# Patient Record
Sex: Female | Born: 1980
Health system: Southern US, Community
[De-identification: ages and names within clinical notes are randomized; demographics above are authoritative.]

## PROBLEM LIST (undated history)

## (undated) DIAGNOSIS — Z8739 Personal history of other diseases of the musculoskeletal system and connective tissue: Secondary | ICD-10-CM

## (undated) DIAGNOSIS — T7840XA Allergy, unspecified, initial encounter: Secondary | ICD-10-CM

## (undated) DIAGNOSIS — K219 Gastro-esophageal reflux disease without esophagitis: Secondary | ICD-10-CM

## (undated) DIAGNOSIS — K589 Irritable bowel syndrome without diarrhea: Secondary | ICD-10-CM

## (undated) DIAGNOSIS — M1990 Unspecified osteoarthritis, unspecified site: Secondary | ICD-10-CM

## (undated) DIAGNOSIS — G8929 Other chronic pain: Secondary | ICD-10-CM

## (undated) DIAGNOSIS — F419 Anxiety disorder, unspecified: Secondary | ICD-10-CM

## (undated) DIAGNOSIS — F32A Depression, unspecified: Secondary | ICD-10-CM

## (undated) DIAGNOSIS — M797 Fibromyalgia: Secondary | ICD-10-CM

## (undated) DIAGNOSIS — J189 Pneumonia, unspecified organism: Secondary | ICD-10-CM

## (undated) DIAGNOSIS — R519 Headache, unspecified: Secondary | ICD-10-CM

## (undated) DIAGNOSIS — E669 Obesity, unspecified: Secondary | ICD-10-CM

## (undated) HISTORY — DX: Obesity, unspecified: E66.9

## (undated) HISTORY — DX: Fibromyalgia: M79.7

## (undated) HISTORY — DX: Irritable bowel syndrome, unspecified: K58.9

## (undated) HISTORY — DX: Depression, unspecified: F32.A

## (undated) HISTORY — DX: Allergy, unspecified, initial encounter: T78.40XA

## (undated) HISTORY — PX: TUBAL LIGATION: SHX77

## (undated) HISTORY — DX: Gastro-esophageal reflux disease without esophagitis: K21.9

## (undated) HISTORY — DX: Pneumonia, unspecified organism: J18.9

## (undated) HISTORY — DX: Other chronic pain: G89.29

## (undated) HISTORY — DX: Unspecified osteoarthritis, unspecified site: M19.90

## (undated) HISTORY — PX: SPINE SURGERY: SHX786

## (undated) HISTORY — DX: Headache, unspecified: R51.9

## (undated) HISTORY — PX: TONSILLECTOMY: SUR1361

---
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
## 1998-11-21 ENCOUNTER — Other Ambulatory Visit: Admission: RE | Admit: 1998-11-21 | Discharge: 1998-11-21 | Payer: Self-pay | Admitting: Obstetrics and Gynecology

## 1999-01-06 ENCOUNTER — Other Ambulatory Visit: Admission: RE | Admit: 1999-01-06 | Discharge: 1999-01-06 | Payer: Self-pay | Admitting: Obstetrics and Gynecology

## 1999-01-06 ENCOUNTER — Encounter (INDEPENDENT_AMBULATORY_CARE_PROVIDER_SITE_OTHER): Payer: Self-pay | Admitting: Specialist

## 1999-04-02 ENCOUNTER — Other Ambulatory Visit: Admission: RE | Admit: 1999-04-02 | Discharge: 1999-04-02 | Payer: Self-pay | Admitting: Gynecology

## 1999-06-25 ENCOUNTER — Other Ambulatory Visit: Admission: RE | Admit: 1999-06-25 | Discharge: 1999-06-25 | Payer: Self-pay | Admitting: Obstetrics and Gynecology

## 1999-09-23 ENCOUNTER — Other Ambulatory Visit: Admission: RE | Admit: 1999-09-23 | Discharge: 1999-09-23 | Payer: Self-pay | Admitting: Obstetrics and Gynecology

## 1999-10-02 ENCOUNTER — Encounter: Admission: RE | Admit: 1999-10-02 | Discharge: 1999-10-16 | Payer: Self-pay | Admitting: Family Medicine

## 2001-02-06 ENCOUNTER — Other Ambulatory Visit: Admission: RE | Admit: 2001-02-06 | Discharge: 2001-02-06 | Payer: Self-pay | Admitting: Obstetrics and Gynecology

## 2002-02-16 ENCOUNTER — Other Ambulatory Visit: Admission: RE | Admit: 2002-02-16 | Discharge: 2002-02-16 | Payer: Self-pay | Admitting: Obstetrics and Gynecology

## 2002-10-19 ENCOUNTER — Other Ambulatory Visit: Admission: RE | Admit: 2002-10-19 | Discharge: 2002-10-19 | Payer: Self-pay | Admitting: Obstetrics and Gynecology

## 2003-02-15 ENCOUNTER — Ambulatory Visit (HOSPITAL_COMMUNITY): Admission: RE | Admit: 2003-02-15 | Discharge: 2003-02-15 | Payer: Self-pay | Admitting: Obstetrics and Gynecology

## 2003-05-13 ENCOUNTER — Inpatient Hospital Stay (HOSPITAL_COMMUNITY): Admission: AD | Admit: 2003-05-13 | Discharge: 2003-05-15 | Payer: Self-pay | Admitting: Obstetrics and Gynecology

## 2003-06-17 ENCOUNTER — Other Ambulatory Visit: Admission: RE | Admit: 2003-06-17 | Discharge: 2003-06-17 | Payer: Self-pay | Admitting: Obstetrics and Gynecology

## 2006-11-07 ENCOUNTER — Inpatient Hospital Stay (HOSPITAL_COMMUNITY): Admission: AD | Admit: 2006-11-07 | Discharge: 2006-11-07 | Payer: Self-pay | Admitting: Obstetrics and Gynecology

## 2007-02-22 ENCOUNTER — Ambulatory Visit (HOSPITAL_COMMUNITY): Admission: RE | Admit: 2007-02-22 | Discharge: 2007-02-22 | Payer: Self-pay | Admitting: Obstetrics and Gynecology

## 2007-05-23 ENCOUNTER — Inpatient Hospital Stay (HOSPITAL_COMMUNITY): Admission: AD | Admit: 2007-05-23 | Discharge: 2007-05-25 | Payer: Self-pay | Admitting: Obstetrics and Gynecology

## 2008-07-26 ENCOUNTER — Emergency Department (HOSPITAL_COMMUNITY): Admission: EM | Admit: 2008-07-26 | Discharge: 2008-07-26 | Payer: Self-pay | Admitting: Emergency Medicine

## 2009-12-08 ENCOUNTER — Ambulatory Visit (HOSPITAL_COMMUNITY): Admission: RE | Admit: 2009-12-08 | Discharge: 2009-12-08 | Payer: Self-pay | Admitting: Occupational Medicine

## 2009-12-22 ENCOUNTER — Encounter
Admission: RE | Admit: 2009-12-22 | Discharge: 2010-03-22 | Payer: Self-pay | Source: Home / Self Care | Attending: Internal Medicine | Admitting: Internal Medicine

## 2010-06-12 ENCOUNTER — Other Ambulatory Visit: Payer: Self-pay | Admitting: Orthopedic Surgery

## 2010-06-12 ENCOUNTER — Ambulatory Visit
Admission: RE | Admit: 2010-06-12 | Discharge: 2010-06-12 | Disposition: A | Discharge status: Home / Self Care | Payer: Worker's Compensation | Source: Ambulatory Visit | Attending: Orthopedic Surgery | Admitting: Orthopedic Surgery

## 2010-06-12 DIAGNOSIS — M25512 Pain in left shoulder: Secondary | ICD-10-CM

## 2010-07-08 LAB — POCT I-STAT, CHEM 8
Calcium, Ion: 1.16 mmol/L (ref 1.12–1.32)
Creatinine, Ser: 0.9 mg/dL (ref 0.4–1.2)
Glucose, Bld: 85 mg/dL (ref 70–99)
HCT: 43 % (ref 36.0–46.0)
Hemoglobin: 14.6 g/dL (ref 12.0–15.0)
TCO2: 24 mmol/L (ref 0–100)

## 2010-12-18 LAB — CBC
HCT: 37.8
Hemoglobin: 13.4
MCHC: 35
MCHC: 35.6
MCV: 93
MCV: 93.8
Platelets: 222
Platelets: 247
RBC: 3.53 — ABNORMAL LOW
RBC: 4.07
RDW: 14
WBC: 12 — ABNORMAL HIGH
WBC: 14.7 — ABNORMAL HIGH

## 2010-12-18 LAB — RH IMMUNE GLOB WKUP(>/=20WKS)(NOT WOMEN'S HOSP): Fetal Screen: NEGATIVE

## 2010-12-18 LAB — RPR: RPR Ser Ql: NONREACTIVE

## 2011-01-05 LAB — RH IMMUNE GLOBULIN WORKUP (NOT WOMEN'S HOSP)
ABO/RH(D): A NEG
Antibody Screen: POSITIVE
DAT, IgG: NEGATIVE

## 2011-01-11 LAB — RH IMMUNE GLOBULIN WORKUP (NOT WOMEN'S HOSP)

## 2011-04-24 ENCOUNTER — Encounter (HOSPITAL_COMMUNITY): Payer: Self-pay

## 2011-04-24 ENCOUNTER — Other Ambulatory Visit: Payer: Self-pay

## 2011-04-24 ENCOUNTER — Emergency Department (HOSPITAL_COMMUNITY): Payer: 59

## 2011-04-24 ENCOUNTER — Emergency Department (HOSPITAL_COMMUNITY)
Admission: EM | Admit: 2011-04-24 | Discharge: 2011-04-24 | Disposition: A | Discharge status: Home / Self Care | Payer: 59 | Attending: Emergency Medicine | Admitting: Emergency Medicine

## 2011-04-24 DIAGNOSIS — Z79899 Other long term (current) drug therapy: Secondary | ICD-10-CM | POA: Insufficient documentation

## 2011-04-24 DIAGNOSIS — J45909 Unspecified asthma, uncomplicated: Secondary | ICD-10-CM | POA: Insufficient documentation

## 2011-04-24 DIAGNOSIS — R0789 Other chest pain: Secondary | ICD-10-CM

## 2011-04-24 DIAGNOSIS — R079 Chest pain, unspecified: Secondary | ICD-10-CM | POA: Insufficient documentation

## 2011-04-24 DIAGNOSIS — E876 Hypokalemia: Secondary | ICD-10-CM | POA: Insufficient documentation

## 2011-04-24 LAB — POCT I-STAT, CHEM 8
Calcium, Ion: 1.18 mmol/L (ref 1.12–1.32)
Chloride: 109 mEq/L (ref 96–112)
Glucose, Bld: 87 mg/dL (ref 70–99)
HCT: 41 % (ref 36.0–46.0)
Hemoglobin: 13.9 g/dL (ref 12.0–15.0)
TCO2: 21 mmol/L (ref 0–100)

## 2011-04-24 LAB — D-DIMER, QUANTITATIVE: D-Dimer, Quant: 1.36 ug/mL-FEU — ABNORMAL HIGH (ref 0.00–0.48)

## 2011-04-24 MED ORDER — IBUPROFEN 800 MG PO TABS: 800.0000 mg | ORAL_TABLET | Freq: Three times a day (TID) | ORAL | Status: AC | PRN

## 2011-04-24 MED ORDER — IOHEXOL 350 MG/ML SOLN
80.0000 mL | Freq: Once | INTRAVENOUS | Status: AC | PRN
  Administered 2011-04-24: 80 mL via INTRAVENOUS

## 2011-04-24 MED ORDER — ONDANSETRON HCL 4 MG/2ML IJ SOLN
4.0000 mg | Freq: Once | INTRAMUSCULAR | Status: DC
  Filled 2011-04-24: qty 2

## 2011-04-24 MED ORDER — FENTANYL CITRATE 0.05 MG/ML IJ SOLN
50.0000 ug | Freq: Once | INTRAMUSCULAR | Status: DC
  Filled 2011-04-24: qty 2

## 2011-04-24 MED ORDER — ASPIRIN 81 MG PO CHEW
324.0000 mg | CHEWABLE_TABLET | Freq: Once | ORAL | Status: DC
  Filled 2011-04-24: qty 4

## 2011-04-24 MED ORDER — POTASSIUM CHLORIDE CRYS ER 20 MEQ PO TBCR
40.0000 meq | EXTENDED_RELEASE_TABLET | Freq: Once | ORAL | Status: AC
  Administered 2011-04-24: 40 meq via ORAL
  Filled 2011-04-24: qty 2

## 2011-04-24 MED ORDER — MORPHINE SULFATE 4 MG/ML IJ SOLN
4.0000 mg | Freq: Once | INTRAMUSCULAR | Status: AC
  Administered 2011-04-24: 4 mg via INTRAVENOUS
  Filled 2011-04-24: qty 1

## 2011-04-24 MED ORDER — HYDROCODONE-ACETAMINOPHEN 5-325 MG PO TABS: 1.0000 | ORAL_TABLET | Freq: Four times a day (QID) | ORAL | Status: AC | PRN

## 2011-04-24 MED ORDER — KETOROLAC TROMETHAMINE 30 MG/ML IJ SOLN
30.0000 mg | Freq: Once | INTRAMUSCULAR | Status: AC
  Administered 2011-04-24: 30 mg via INTRAVENOUS
  Filled 2011-04-24: qty 1

## 2011-04-24 NOTE — ED Provider Notes (Signed)
History     CSN: [id number]  Arrival date & time 04/24/11  1535   First MD Initiated Contact with Patient 04/24/11 1539      Chief Complaint  Patient presents with  . Chest Pain    (Consider location/radiation/quality/duration/timing/severity/associated sxs/prior treatment) HPI Patient states that she was working here in x-ray shows narrowing of patient's a sudden onset of right-sided chest pain that radiates to her right shoulder blade and back.  Patient states she has not short of breath but it hurts to take a deep breath.  She denies abdominal pain, nausea/vomiting, weakness, headache, or visual changes.  Patient does not worsening with movement and noted after pushing a stretcher but was more constant and increasing. Past Medical History  Diagnosis Date  . Asthma     History reviewed. No pertinent past surgical history.  History reviewed. No pertinent family history.  History  Substance Use Topics  . Smoking status: Never Smoker   . Smokeless tobacco: Not on file  . Alcohol Use: No    OB History    Grav Para Term Preterm Abortions TAB SAB Ect Mult Living                  Review of Systems All pertinent positives and negatives reviewed in the history of present illness  Allergies  Sulfa antibiotics  Home Medications   Current Outpatient Rx  Name Route Sig Dispense Refill  . ALBUTEROL SULFATE HFA 108 (90 BASE) MCG/ACT IN AERS Inhalation Inhale 2 puffs into the lungs every 6 (six) hours as needed. For wheezing    . AMOXICILLIN 875 MG PO TABS Oral Take 875 mg by mouth 2 (two) times daily.    Marland Kitchen CETIRIZINE HCL 10 MG PO TABS Oral Take 10 mg by mouth daily as needed. For allergy symptoms    . DESOGESTREL-ETHINYL ESTRADIOL 0.15-0.02/0.01 MG (21/5) PO TABS Oral Take 1 tablet by mouth daily.    . IBUPROFEN 200 MG PO TABS Oral Take 800 mg by mouth daily as needed. For pain    . PROBIOTIC PO Oral Take 1 tablet by mouth every morning.      BP 122/77  Pulse 96   Temp(Src) 97.6 F (36.4 C) (Oral)  Resp 14  SpO2 100%  LMP 01/28/2011  Physical Exam  Constitutional: She is oriented to person, place, and time. She appears well-developed and well-nourished. She appears distressed.  HENT:  Head: Normocephalic and atraumatic.  Cardiovascular: Normal rate, regular rhythm and normal heart sounds.  Exam reveals no gallop and no friction rub.   No murmur heard. Pulmonary/Chest: Effort normal and breath sounds normal. No respiratory distress. She has no wheezes. She has no rales. She exhibits tenderness.    Musculoskeletal:       Thoracic back: She exhibits tenderness and pain.       Back:  Neurological: She is alert and oriented to person, place, and time.  Skin: Skin is warm and dry.    ED Course  Procedures (including critical care time)  Labs Reviewed  POCT I-STAT, CHEM 8 - Abnormal; Notable for the following:    Potassium 3.3 (*)    All other components within normal limits  POCT PREGNANCY, URINE  POCT PREGNANCY, URINE  D-DIMER, QUANTITATIVE  I-STAT, CHEM 8   The patient has left it was chest wall pain based on her history of present illness physical exam findings awaiting testing.  She is Wells criteria low risk.  Therefore we get d-dimer to confirm this  low risk status.  Patient is given pain control and will be further evaluated.     5:40 PM Reviewed the patient's lab testing and her CT scan of her chest.  She does not have a PE based on her CTA of her chest.  Her potassium was slightly low at 3.3 we will give her 40 mEq potassium orally.  I feel based on her history of present illness physical exam findings along with all of her testing this is a chest wall strain that occurred due to physical activity.  Patient will be treated as such and I am going to recheck the patient does timing of her test results.   5:44 PM I just finished speaking with the patient about her test results.  Is feeling much better and not having much pain at  this time.  She is advised of all the results and including the low potassium.  I advised her to eat 2 bananas a day for the next week should be sufficient.  All questions were answered and verbal understanding for the patient about the plan was given.   MDM   Date: 04/24/2011  Rate:109  Rhythm: sinus tachycardia  QRS Axis: normal  Intervals: normal  ST/T Wave abnormalities: normal and nonspecific T wave changes  Conduction Disutrbances:none  Narrative Interpretation:   Old EKG Reviewed: unchanged   MDM Reviewed: previous chart, nursing note and vitals Interpretation: labs, ECG, CT scan and x-ray         Brent General, PA-C 04/24/11 1746

## 2011-04-24 NOTE — ED Notes (Signed)
Gave old and new ECG to Dr. Ulice Dash after I performed.3:47 pm JG.

## 2011-04-24 NOTE — ED Provider Notes (Signed)
Complains of sudden onset right-sided parasternal chest pain radiating to back onset 3 PM today while moving a patient pain is pleuritic worse with moving her right arm no treatment prior to coming here on exam alert nontoxic no respiratory distress speaks in paragraphs lungs clear to auscultation heart regular rate and rhythm no murmurs chest, with tender right side anteriorly reproducing pain exactly pain is also reproduced by forcible flexion of right shoulder  Orlie Dakin, MD 04/25/11 [phone]

## 2011-04-24 NOTE — ED Notes (Signed)
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 Pt is alert and oriented. Pt appears very anxious and tearful. She denies smoking but is on oral contraceptives. She states that she has been having some calf pain but could not pinpoint when it had started. Breath sounds are clear but diminished at the bases because pt reports severe pain with deep breath. Iv started, labs obtained, protocols initiated but changed when evaluated by edp.

## 2011-04-24 NOTE — ED Provider Notes (Signed)
Medical screening examination/treatment/procedure(s) were conducted as a shared visit with non-physician practitioner(s) and myself.  I personally evaluated the patient during the encounter  Orlie Dakin, MD 04/24/11 [phone]

## 2011-04-24 NOTE — ED Notes (Signed)
Pt was at work when she had sudden onset of severe right sided chest pain that is worse with movement and deep inspiration. No meds pta. Alert and oriented. Pt is tearful upon arrival from her dept. She states that the pain got worse after she was pushing a stretcher.

## 2011-11-01 ENCOUNTER — Encounter: Payer: Self-pay | Admitting: Emergency Medicine

## 2011-11-01 ENCOUNTER — Emergency Department
Admission: EM | Admit: 2011-11-01 | Discharge: 2011-11-01 | Disposition: A | Discharge status: Home / Self Care | Payer: 59 | Source: Home / Self Care

## 2011-11-01 DIAGNOSIS — J069 Acute upper respiratory infection, unspecified: Secondary | ICD-10-CM

## 2011-11-01 MED ORDER — HYDROCOD POLST-CHLORPHEN POLST 10-8 MG/5ML PO LQCR: 5.0000 mL | Freq: Two times a day (BID) | ORAL | Status: DC | PRN

## 2011-11-01 MED ORDER — AZITHROMYCIN 250 MG PO TABS: ORAL_TABLET | ORAL | Status: AC

## 2011-11-01 NOTE — ED Provider Notes (Signed)
History     CSN: [id number]  Arrival date & time 11/01/11  0808   First MD Initiated Contact with Patient 11/01/11 [phone]      Chief Complaint  Patient presents with  . URI  HPI URI Symptoms Onset: 5-6 days  Description: rhinorrhea, nasal congestion, post nasal drip, cough, generalized malaise, low grade fever Modifying factors:  Baseline asthma, no wheezing   Symptoms Nasal discharge: yes Fever: yes; tmax 100.8 Sore throat: yes Cough: yes Wheezing: no Ear pain: no GI symptoms: mild nausea Sick contacts: works at [employer] hospital main; multiple sick contacts   [employer]  Stiff neck: no Dyspnea: no Rash: no Swallowing difficulty: no  Sinusitis Risk Factors Headache/face pain: no Double sickening: no tooth pain: no  Allergy Risk Factors Sneezing: no Itchy scratchy throat: no Seasonal symptoms: no  Flu Risk Factors Headache: no muscle aches: no severe fatigue: mild   Past Medical History  Diagnosis Date  . Asthma     No past surgical history on file.  No family history on file.  History  Substance Use Topics  . Smoking status: Never Smoker   . Smokeless tobacco: Not on file  . Alcohol Use: No    OB History    Grav Para Term Preterm Abortions TAB SAB Ect Mult Living                  Review of Systems  All other systems reviewed and are negative.    Allergies  Sulfa antibiotics  Home Medications   Current Outpatient Rx  Name Route Sig Dispense Refill  . ALBUTEROL SULFATE HFA 108 (90 BASE) MCG/ACT IN AERS Inhalation Inhale 2 puffs into the lungs every 6 (six) hours as needed. For wheezing    . AMOXICILLIN 875 MG PO TABS Oral Take 875 mg by mouth 2 (two) times daily.    Marland Kitchen CETIRIZINE HCL 10 MG PO TABS Oral Take 10 mg by mouth daily as needed. For allergy symptoms    . DESOGESTREL-ETHINYL ESTRADIOL 0.15-0.02/0.01 MG (21/5) PO TABS Oral Take 1 tablet by mouth daily.    . IBUPROFEN 200 MG PO TABS Oral Take 800 mg by mouth daily as needed. For pain     . PROBIOTIC PO Oral Take 1 tablet by mouth every morning.      There were no vitals taken for this visit.  Physical Exam  Constitutional: She appears well-developed and well-nourished.  HENT:  Head: Normocephalic and atraumatic.       +nasal erythema, rhinorrhea bilaterally, + post oropharyngeal erythema     Eyes: Conjunctivae are normal. Pupils are equal, round, and reactive to light.  Neck: Normal range of motion. Neck supple.  Cardiovascular: Normal rate and regular rhythm.   Pulmonary/Chest: Effort normal and breath sounds normal.  Abdominal: Soft. Bowel sounds are normal.  Musculoskeletal: Normal range of motion.  Neurological: She is alert.  Skin: Skin is warm.    ED Course  Procedures (including critical care time)  Labs Reviewed - No data to display No results found.   1. URI (upper respiratory infection)       MDM  Exam consistent with viral URI.  Tussionex for cough  Will cover with azithromycin for atypical coverage.  Discussed supportive care and infectious red flags.  Handout given.      The patient and/or caregiver has been counseled thoroughly with regard to treatment plan and/or medications prescribed including dosage, schedule, interactions, rationale for use, and possible side effects and they verbalize  understanding. Diagnoses and expected course of recovery discussed and will return if not improved as expected or if the condition worsens. Patient and/or caregiver verbalized understanding.             Deneise Lever, MD 11/01/11 [phone]

## 2011-11-01 NOTE — ED Notes (Signed)
URI, cough, sore throat, green mucus, hoarseness, eyes crusty, fever x 6 days

## 2011-11-03 NOTE — ED Provider Notes (Signed)
Agree with exam, assessment, and plan.   Kandra Nicolas, MD 11/03/11 [phone]

## 2012-01-12 ENCOUNTER — Ambulatory Visit (HOSPITAL_COMMUNITY)
Admission: RE | Admit: 2012-01-12 | Discharge: 2012-01-12 | Disposition: A | Discharge status: Home / Self Care | Payer: 59 | Source: Ambulatory Visit | Attending: General Practice | Admitting: General Practice

## 2012-01-12 ENCOUNTER — Other Ambulatory Visit: Payer: Self-pay

## 2012-01-12 ENCOUNTER — Other Ambulatory Visit (HOSPITAL_COMMUNITY): Payer: Self-pay | Admitting: Nurse Practitioner

## 2012-01-12 DIAGNOSIS — R52 Pain, unspecified: Secondary | ICD-10-CM | POA: Insufficient documentation

## 2012-02-16 ENCOUNTER — Emergency Department
Admission: EM | Admit: 2012-02-16 | Discharge: 2012-02-16 | Disposition: A | Discharge status: Home / Self Care | Payer: 59 | Source: Home / Self Care

## 2012-02-16 ENCOUNTER — Telehealth: Payer: Self-pay | Admitting: Emergency Medicine

## 2012-02-16 ENCOUNTER — Emergency Department (INDEPENDENT_AMBULATORY_CARE_PROVIDER_SITE_OTHER): Payer: 59

## 2012-02-16 ENCOUNTER — Encounter: Payer: Self-pay | Admitting: Emergency Medicine

## 2012-02-16 DIAGNOSIS — R0989 Other specified symptoms and signs involving the circulatory and respiratory systems: Secondary | ICD-10-CM

## 2012-02-16 DIAGNOSIS — R062 Wheezing: Secondary | ICD-10-CM

## 2012-02-16 DIAGNOSIS — J40 Bronchitis, not specified as acute or chronic: Secondary | ICD-10-CM

## 2012-02-16 DIAGNOSIS — J45901 Unspecified asthma with (acute) exacerbation: Secondary | ICD-10-CM

## 2012-02-16 DIAGNOSIS — R059 Cough, unspecified: Secondary | ICD-10-CM

## 2012-02-16 DIAGNOSIS — J069 Acute upper respiratory infection, unspecified: Secondary | ICD-10-CM

## 2012-02-16 DIAGNOSIS — R05 Cough: Secondary | ICD-10-CM

## 2012-02-16 MED ORDER — PREDNISONE 50 MG PO TABS: ORAL_TABLET | ORAL | Status: DC

## 2012-02-16 MED ORDER — METHYLPREDNISOLONE SODIUM SUCC 125 MG IJ SOLR
125.0000 mg | Freq: Once | INTRAMUSCULAR | Status: AC
  Administered 2012-02-16: 125 mg via INTRAMUSCULAR

## 2012-02-16 MED ORDER — ALBUTEROL SULFATE HFA 108 (90 BASE) MCG/ACT IN AERS: 2.0000 | INHALATION_SPRAY | RESPIRATORY_TRACT | Status: DC | PRN

## 2012-02-16 MED ORDER — HYDROCOD POLST-CHLORPHEN POLST 10-8 MG/5ML PO LQCR: 5.0000 mL | Freq: Two times a day (BID) | ORAL | Status: DC | PRN

## 2012-02-16 MED ORDER — AZITHROMYCIN 250 MG PO TABS: ORAL_TABLET | ORAL | Status: DC

## 2012-02-16 NOTE — ED Provider Notes (Signed)
History     CSN: [id number]  Arrival date & time 02/16/12  0909   First MD Initiated Contact with Patient 02/16/12 0911      Chief Complaint  Patient presents with  . Cough   HPI  URI Symptoms Onset: 12 days  Description: sinus pressure, nasal drainage, cough, SOB, wheezing, bilateral ear pain  Modifying factors:  Baseline hx/o asthma, is not using any medication for this.   Symptoms Nasal discharge: yes Fever: no Sore throat: yes Cough: yes Wheezing: yes Ear pain: yes GI symptoms: no Sick contacts: yes  Red Flags  Stiff neck: no Dyspnea: yes Rash: no Swallowing difficulty: no  Sinusitis Risk Factors Headache/face pain: no Double sickening: no tooth pain: no  Allergy Risk Factors Sneezing: no Itchy scratchy throat: no Seasonal symptoms: yes  Flu Risk Factors Headache: no muscle aches: no severe fatigue: no   Past Medical History  Diagnosis Date  . Asthma     History reviewed. No pertinent past surgical history.  Family History  Problem Relation Age of Onset  . Hypertension Mother   . Hypertension Father     History  Substance Use Topics  . Smoking status: Never Smoker   . Smokeless tobacco: Not on file  . Alcohol Use: Yes    OB History    Grav Para Term Preterm Abortions TAB SAB Ect Mult Living                  Review of Systems  All other systems reviewed and are negative.    Allergies  Sulfa antibiotics  Home Medications   Current Outpatient Rx  Name  Route  Sig  Dispense  Refill  . ALBUTEROL SULFATE HFA 108 (90 BASE) MCG/ACT IN AERS   Inhalation   Inhale 2 puffs into the lungs every 6 (six) hours as needed. For wheezing         . AMOXICILLIN 875 MG PO TABS   Oral   Take 875 mg by mouth 2 (two) times daily.         Marland Kitchen CETIRIZINE HCL 10 MG PO TABS   Oral   Take 10 mg by mouth daily as needed. For allergy symptoms         . HYDROCOD POLST-CPM POLST ER 10-8 MG/5ML PO LQCR   Oral   Take 5 mLs by mouth every 12  (twelve) hours as needed (cough).   60 mL   0   . DESOGESTREL-ETHINYL ESTRADIOL 0.15-0.02/0.01 MG (21/5) PO TABS   Oral   Take 1 tablet by mouth daily.         . IBUPROFEN 200 MG PO TABS   Oral   Take 800 mg by mouth daily as needed. For pain         . PROBIOTIC PO   Oral   Take 1 tablet by mouth every morning.           BP 122/83  Pulse 79  Temp 98.1 F (36.7 C) (Oral)  Resp 16  Ht 5' 5"  (1.651 m)  Wt 169 lb (76.658 kg)  BMI 28.12 kg/m2  SpO2 100%  Physical Exam  Constitutional: She appears well-developed and well-nourished.  HENT:  Head: Normocephalic and atraumatic.  Right Ear: External ear normal.  Left Ear: External ear normal.       +nasal erythema, rhinorrhea bilaterally, + post oropharyngeal erythema    Eyes: Conjunctivae normal are normal. Pupils are equal, round, and reactive to light.  Neck: Normal range  of motion. Neck supple.  Cardiovascular: Normal rate, regular rhythm and normal heart sounds.   Pulmonary/Chest: Effort normal and breath sounds normal.  Abdominal: Soft.  Musculoskeletal: Normal range of motion.  Lymphadenopathy:    She has no cervical adenopathy.  Neurological: She is alert.  Skin: Skin is warm.    ED Course  Procedures (including critical care time)  Labs Reviewed - No data to display Dg Chest 2 View  02/16/2012  *RADIOLOGY REPORT*  Clinical Data: Cough for 12 days, congestion  CHEST - 2 VIEW  Comparison: Chest x-ray of 04/26/2011  Findings: The lungs are clear.  Mediastinal contours are stable. The heart is within normal limits in size.  No bony abnormality is seen.  IMPRESSION: Stable chest x-ray.  No active lung disease.   Original Report Authenticated By: Ivar Drape, M.D.      1. URI (upper respiratory infection)   2. Bronchitis   3. Wheezing symptom   4. Asthma exacerbation       MDM  CXR negative for infiltrate.  Likely viral URI induced asthma exacerbation.  Solumedrol 16m IM x1.  Prednisone x 5 days.    zpak for atypical coverage.     The patient and/or caregiver has been counseled thoroughly with regard to treatment plan and/or medications prescribed including dosage, schedule, interactions, rationale for use, and possible side effects and they verbalize understanding. Diagnoses and expected course of recovery discussed and will return if not improved as expected or if the condition worsens. Patient and/or caregiver verbalized understanding.             SShanda Howells MD 02/16/12 1400

## 2012-02-16 NOTE — ED Notes (Signed)
Productive cough, green mucus, sinus pain and pressure, fever, bi-lateral ear pain x 12 days

## 2012-02-17 ENCOUNTER — Encounter (HOSPITAL_COMMUNITY): Payer: Self-pay

## 2012-02-23 ENCOUNTER — Encounter (HOSPITAL_COMMUNITY): Payer: Self-pay

## 2012-02-23 ENCOUNTER — Encounter (HOSPITAL_COMMUNITY)
Admission: RE | Admit: 2012-02-23 | Discharge: 2012-02-23 | Disposition: A | Discharge status: Home / Self Care | Payer: 59 | Source: Ambulatory Visit | Attending: Obstetrics and Gynecology | Admitting: Obstetrics and Gynecology

## 2012-02-23 HISTORY — DX: Personal history of other diseases of the musculoskeletal system and connective tissue: Z87.39

## 2012-02-23 LAB — CBC
HCT: 37.6 % (ref 36.0–46.0)
MCV: 90.4 fL (ref 78.0–100.0)
RDW: 13.7 % (ref 11.5–15.5)
WBC: 8.5 10*3/uL (ref 4.0–10.5)

## 2012-02-23 NOTE — Patient Instructions (Addendum)
Hamel  02/23/2012   Your procedure is scheduled on:  02/28/12  Enter through the Main Entrance of [facility] at 8 AM.  Pick up the phone at the desk and dial [phone].   Call this number if you have problems the morning of surgery: [phone]   Remember:   Do not eat food:After Midnight.  Do not drink clear liquids: After Midnight.  Take these medicines the morning of surgery with A SIP OF WATER: May take Xanax AM of surgery if needed   Do not wear jewelry, make-up or nail polish.  Do not wear lotions, powders, or perfumes. You may wear deodorant.  Do not shave 48 hours prior to surgery.  Do not bring valuables to the hospital.  Contacts, dentures or bridgework may not be worn into surgery.  Leave suitcase in the car. After surgery it may be brought to your room.  For patients admitted to the hospital, checkout time is 11:00 AM the day of discharge.   Patients discharged the day of surgery will not be allowed to drive home.  Name and phone number of your driver: undecided  Special Instructions: Shower using CHG 2 nights before surgery and the night before surgery.  If you shower the day of surgery use CHG.  Use special wash - you have one bottle of CHG for all showers.  You should use approximately 1/3 of the bottle for each shower.   Please read over the following fact sheets that you were given: MRSA Information

## 2012-02-28 ENCOUNTER — Ambulatory Visit (HOSPITAL_COMMUNITY): Payer: 59 | Admitting: Anesthesiology

## 2012-02-28 ENCOUNTER — Encounter (HOSPITAL_COMMUNITY)
Admission: RE | Disposition: A | Discharge status: Home / Self Care | Payer: Self-pay | Source: Ambulatory Visit | Attending: Obstetrics and Gynecology

## 2012-02-28 ENCOUNTER — Encounter (HOSPITAL_COMMUNITY): Payer: Self-pay | Admitting: Anesthesiology

## 2012-02-28 ENCOUNTER — Ambulatory Visit (HOSPITAL_COMMUNITY)
Admission: RE | Admit: 2012-02-28 | Discharge: 2012-02-28 | Disposition: A | Discharge status: Home / Self Care | Payer: 59 | Source: Ambulatory Visit | Attending: Obstetrics and Gynecology | Admitting: Obstetrics and Gynecology

## 2012-02-28 DIAGNOSIS — Z9851 Tubal ligation status: Secondary | ICD-10-CM

## 2012-02-28 DIAGNOSIS — Z01818 Encounter for other preprocedural examination: Secondary | ICD-10-CM | POA: Insufficient documentation

## 2012-02-28 DIAGNOSIS — Z302 Encounter for sterilization: Secondary | ICD-10-CM | POA: Insufficient documentation

## 2012-02-28 DIAGNOSIS — Z01812 Encounter for preprocedural laboratory examination: Secondary | ICD-10-CM | POA: Insufficient documentation

## 2012-02-28 HISTORY — PX: LAPAROSCOPIC TUBAL LIGATION: SHX1937

## 2012-02-28 SURGERY — LIGATION, FALLOPIAN TUBE, LAPAROSCOPIC
Anesthesia: General | Site: Abdomen | Laterality: Bilateral | Wound class: Clean Contaminated

## 2012-02-28 MED ORDER — BUPIVACAINE HCL (PF) 0.25 % IJ SOLN
INTRAMUSCULAR | Status: DC | PRN
  Administered 2012-02-28: 4 mL

## 2012-02-28 MED ORDER — CEFAZOLIN SODIUM-DEXTROSE 2-3 GM-% IV SOLR
INTRAVENOUS | Status: AC
  Filled 2012-02-28: qty 50

## 2012-02-28 MED ORDER — IBUPROFEN 200 MG PO TABS: 600.0000 mg | ORAL_TABLET | Freq: Four times a day (QID) | ORAL | Status: DC | PRN

## 2012-02-28 MED ORDER — PROPOFOL 10 MG/ML IV EMUL
INTRAVENOUS | Status: AC
  Filled 2012-02-28: qty 20

## 2012-02-28 MED ORDER — KETOROLAC TROMETHAMINE 30 MG/ML IJ SOLN
INTRAMUSCULAR | Status: AC
  Filled 2012-02-28: qty 1

## 2012-02-28 MED ORDER — GLYCOPYRROLATE 0.2 MG/ML IJ SOLN
INTRAMUSCULAR | Status: DC | PRN
  Administered 2012-02-28: .8 mg via INTRAVENOUS

## 2012-02-28 MED ORDER — KETOROLAC TROMETHAMINE 30 MG/ML IJ SOLN
INTRAMUSCULAR | Status: DC | PRN
  Administered 2012-02-28: 30 mg via INTRAVENOUS

## 2012-02-28 MED ORDER — NEOSTIGMINE METHYLSULFATE 1 MG/ML IJ SOLN
INTRAMUSCULAR | Status: AC
  Filled 2012-02-28: qty 10

## 2012-02-28 MED ORDER — KETOROLAC TROMETHAMINE 30 MG/ML IJ SOLN: 15.0000 mg | Freq: Once | INTRAMUSCULAR | Status: DC | PRN

## 2012-02-28 MED ORDER — ROCURONIUM BROMIDE 100 MG/10ML IV SOLN
INTRAVENOUS | Status: DC | PRN
  Administered 2012-02-28: 30 mg via INTRAVENOUS

## 2012-02-28 MED ORDER — LIDOCAINE HCL (CARDIAC) 20 MG/ML IV SOLN
INTRAVENOUS | Status: AC
  Filled 2012-02-28: qty 5

## 2012-02-28 MED ORDER — ALBUTEROL SULFATE HFA 108 (90 BASE) MCG/ACT IN AERS
INHALATION_SPRAY | RESPIRATORY_TRACT | Status: DC | PRN
  Administered 2012-02-28: 2 via RESPIRATORY_TRACT

## 2012-02-28 MED ORDER — CEFAZOLIN SODIUM-DEXTROSE 2-3 GM-% IV SOLR
2.0000 g | INTRAVENOUS | Status: AC
  Administered 2012-02-28: 2 g via INTRAVENOUS

## 2012-02-28 MED ORDER — PROPOFOL 10 MG/ML IV EMUL
INTRAVENOUS | Status: DC | PRN
  Administered 2012-02-28: 200 mg via INTRAVENOUS

## 2012-02-28 MED ORDER — HYDROCODONE-ACETAMINOPHEN 5-500 MG PO TABS: 1.0000 | ORAL_TABLET | Freq: Four times a day (QID) | ORAL | Status: DC | PRN

## 2012-02-28 MED ORDER — LACTATED RINGERS IV SOLN
INTRAVENOUS | Status: DC
  Administered 2012-02-28 (×3): via INTRAVENOUS

## 2012-02-28 MED ORDER — ONDANSETRON HCL 4 MG/2ML IJ SOLN
INTRAMUSCULAR | Status: DC | PRN
  Administered 2012-02-28: 4 mg via INTRAVENOUS

## 2012-02-28 MED ORDER — FENTANYL CITRATE 0.05 MG/ML IJ SOLN: 25.0000 ug | INTRAMUSCULAR | Status: DC | PRN

## 2012-02-28 MED ORDER — NEOSTIGMINE METHYLSULFATE 1 MG/ML IJ SOLN
INTRAMUSCULAR | Status: DC | PRN
  Administered 2012-02-28: 4 mg via INTRAVENOUS

## 2012-02-28 MED ORDER — SODIUM CHLORIDE 0.9 % IJ SOLN
INTRAMUSCULAR | Status: DC | PRN
  Administered 2012-02-28: 10 mL

## 2012-02-28 MED ORDER — HYDROCODONE-ACETAMINOPHEN 5-325 MG PO TABS
1.0000 | ORAL_TABLET | Freq: Once | ORAL | Status: AC
  Administered 2012-02-28: 1 via ORAL

## 2012-02-28 MED ORDER — FENTANYL CITRATE 0.05 MG/ML IJ SOLN
INTRAMUSCULAR | Status: DC | PRN
  Administered 2012-02-28 (×2): 50 ug via INTRAVENOUS
  Administered 2012-02-28: 100 ug via INTRAVENOUS
  Administered 2012-02-28: 50 ug via INTRAVENOUS

## 2012-02-28 MED ORDER — ROCURONIUM BROMIDE 50 MG/5ML IV SOLN
INTRAVENOUS | Status: AC
  Filled 2012-02-28: qty 1

## 2012-02-28 MED ORDER — GLYCOPYRROLATE 0.2 MG/ML IJ SOLN
INTRAMUSCULAR | Status: AC
  Filled 2012-02-28: qty 3

## 2012-02-28 MED ORDER — HYDROCODONE-ACETAMINOPHEN 5-325 MG PO TABS
ORAL_TABLET | ORAL | Status: AC
  Administered 2012-02-28: 1 via ORAL
  Filled 2012-02-28: qty 1

## 2012-02-28 MED ORDER — DEXAMETHASONE SODIUM PHOSPHATE 4 MG/ML IJ SOLN
INTRAMUSCULAR | Status: DC | PRN
  Administered 2012-02-28: 5 mg via INTRAVENOUS

## 2012-02-28 MED ORDER — LIDOCAINE HCL (CARDIAC) 20 MG/ML IV SOLN
INTRAVENOUS | Status: DC | PRN
  Administered 2012-02-28: 80 mg via INTRAVENOUS
  Administered 2012-02-28: 10 mg via INTRAVENOUS

## 2012-02-28 MED ORDER — MIDAZOLAM HCL 2 MG/2ML IJ SOLN
INTRAMUSCULAR | Status: AC
  Filled 2012-02-28: qty 2

## 2012-02-28 MED ORDER — MIDAZOLAM HCL 5 MG/5ML IJ SOLN
INTRAMUSCULAR | Status: DC | PRN
  Administered 2012-02-28: 2 mg via INTRAVENOUS

## 2012-02-28 MED ORDER — FENTANYL CITRATE 0.05 MG/ML IJ SOLN
INTRAMUSCULAR | Status: AC
  Filled 2012-02-28: qty 5

## 2012-02-28 SURGICAL SUPPLY — 16 items
ADH SKN CLS APL DERMABOND .7 (GAUZE/BANDAGES/DRESSINGS) ×1
CATH ROBINSON RED A/P 16FR (CATHETERS) ×2 IMPLANT
CHLORAPREP W/TINT 26ML (MISCELLANEOUS) ×2 IMPLANT
CLOTH BEACON ORANGE TIMEOUT ST (SAFETY) ×2 IMPLANT
DERMABOND ADVANCED (GAUZE/BANDAGES/DRESSINGS) ×1
DERMABOND ADVANCED .7 DNX12 (GAUZE/BANDAGES/DRESSINGS) ×1 IMPLANT
GLOVE BIO SURGEON STRL SZ 6.5 (GLOVE) ×4 IMPLANT
GOWN PREVENTION PLUS LG XLONG (DISPOSABLE) ×4 IMPLANT
PACK LAPAROSCOPY BASIN (CUSTOM PROCEDURE TRAY) ×2 IMPLANT
SUT VIC AB 3-0 PS2 18 (SUTURE) ×2
SUT VIC AB 3-0 PS2 18XBRD (SUTURE) IMPLANT
SUT VICRYL 0 UR6 27IN ABS (SUTURE) ×2 IMPLANT
TOWEL OR 17X24 6PK STRL BLUE (TOWEL DISPOSABLE) ×4 IMPLANT
TROCAR Z-THREAD BLADED 11X100M (TROCAR) ×2 IMPLANT
WARMER LAPAROSCOPE (MISCELLANEOUS) ×2 IMPLANT
WATER STERILE IRR 1000ML POUR (IV SOLUTION) ×2 IMPLANT

## 2012-02-28 NOTE — Transfer of Care (Signed)
Immediate Anesthesia Transfer of Care Note  Patient: Tracey Patterson  Procedure(s) Performed: Procedure(s) (LRB) with comments: LAPAROSCOPIC TUBAL LIGATION (Bilateral)  Patient Location: PACU  Anesthesia Type:General  Level of Consciousness: awake, alert , oriented and patient cooperative  Airway & Oxygen Therapy: Patient Spontanous Breathing and Patient connected to nasal cannula oxygen  Post-op Assessment: Report given to PACU RN and Post -op Vital signs reviewed and stable  Post vital signs: Reviewed and stable  Complications: No apparent anesthesia complications

## 2012-02-28 NOTE — Anesthesia Preprocedure Evaluation (Signed)
Anesthesia Evaluation  Patient identified by MRN, date of birth, ID band Patient awake    Reviewed: Allergy & Precautions, H&P , NPO status , Patient's Chart, lab work & pertinent test results, reviewed documented beta blocker date and time   History of Anesthesia Complications Negative for: history of anesthetic complications  Airway Mallampati: I TM Distance: >3 FB Neck ROM: full   Comment: +TMJ - limits mouth opening for pain/feeling of resistance, opens better with coaching Dental  (+) Teeth Intact   Pulmonary asthma (last inhaler use 2-3 days ago, uses more frequently when sick) , Recent URI  (finished z-pack and prednisone one week ago for URI/bronchitis, no fever, some sinus congestion - clear),  breath sounds clear to auscultation  Pulmonary exam normal       Cardiovascular negative cardio ROS  Rhythm:regular Rate:Normal     Neuro/Psych  Headaches (migraines - two this month, usually infrequent), negative psych ROS   GI/Hepatic negative GI ROS, Neg liver ROS,   Endo/Other  negative endocrine ROS  Renal/GU negative Renal ROS  negative genitourinary   Musculoskeletal   Abdominal Normal abdominal exam  (+)   Peds  Hematology negative hematology ROS (+)   Anesthesia Other Findings   Reproductive/Obstetrics negative OB ROS                           Anesthesia Physical Anesthesia Plan  ASA: II  Anesthesia Plan: General ETT   Post-op Pain Management:    Induction:   Airway Management Planned:   Additional Equipment:   Intra-op Plan:   Post-operative Plan:   Informed Consent: I have reviewed the patients History and Physical, chart, labs and discussed the procedure including the risks, benefits and alternatives for the proposed anesthesia with the patient or authorized representative who has indicated his/her understanding and acceptance.   Dental Advisory Given  Plan Discussed  with: CRNA and Surgeon  Anesthesia Plan Comments:         Anesthesia Quick Evaluation

## 2012-02-28 NOTE — Op Note (Signed)
NAMEKELLIN, FIFER             ACCOUNT NO.:  [id number]  MEDICAL RECORD NO.:  [id number]  LOCATION:  [facility]                          FACILITY:  [facility]  PHYSICIAN:  Talitha Dicarlo L. Deovion Batrez, M.D.DATE OF BIRTH:  07-Jun-1980  DATE OF PROCEDURE: DATE OF DISCHARGE:  02/28/2012                              OPERATIVE REPORT   PREOPERATIVE DIAGNOSIS:  Desires permanent sterilization.  POSTOPERATIVE DIAGNOSIS:  Desires permanent sterilization.  PROCEDURE:  Laparoscopic bilateral tubal ligation.  SURGEON:  Aarnav Steagall L. Helane Rima, MD  ANESTHESIA:  General.  EBL:  Minimal.  COMPLICATIONS:  None.  PROCEDURE:  The patient was taken to the operating room.  She was intubated.  She was prepped and draped.  In and out catheter was used to empty the bladder.  The speculum was inserted into the vagina and uterine manipulator was inserted.  Attention was turned to the abdomen where a small infraumbilical incision was made with a scalpel.  The Veress needle was inserted and pneumoperitoneum was performed in standard fashion.  The Veress needle was removed and a trocar was inserted.  The laparoscope was introduced through the trocar sheath. There was no area of bleeding or intestinal injury noted.  The patient was gently placed in Trendelenburg position.  The uterus appeared to be normal.  Adnexa were normal.  No adhesions were noted.  Using the operative laparoscope, I then inserted the Kleppinger and then placed the Kleppinger in the midportion of each fallopian tubes x3 and performed a tubal ligation with cautery ensuring that each time that we use the cautery, the wattage went down to 0.  After we did a bilateral tubal ligation, no bleeding was noted.  The instruments were removed from the abdominal cavity.  Pneumoperitoneum was released.  The trocars were removed.  The incision was closed with 2-0 Vicryl interrupted. Dermabond was applied.  All sponge, lap, and instrument counts were correct x2.  The  patient went to recovery room in stable condition.     Tonja Jezewski L. Helane Rima, M.D.     Nevin Bloodgood  D:  02/28/2012  T:  02/28/2012  Job:  [id number]

## 2012-02-28 NOTE — H&P (Signed)
31 year old G 3 P 2 presents for laparoscopic BTL.    Medical history  Significant for Headaches Asthma  Surgical History  tonisillectomy  Knee surgery  Meds  BCP  Allergic to Sulfa and crab  ROS is unremarkable  Family history is unremarkable  No tobacco or alcohol  Afebrile Vital signs stable  General alert and oriented Lung CTAB Car RRR Abdomen is soft and non tender Pelvic is WNL  IMPRESSION: Desires permanent sterilization  PLAN Laparoscopic BTL Risks of failure discussed with patient Risks of surgery reviewed with the patient

## 2012-02-28 NOTE — Anesthesia Postprocedure Evaluation (Signed)
Anesthesia Post Note  Patient: Geologist, engineering  Procedure(s) Performed: Procedure(s) (LRB): LAPAROSCOPIC TUBAL LIGATION (Bilateral)  Anesthesia type: General  Patient location: PACU  Post pain: Pain level controlled  Post assessment: Post-op Vital signs reviewed  Last Vitals:  Filed Vitals:   02/28/12 1115  BP: 132/108  Pulse: 70  Temp:   Resp: 22    Post vital signs: Reviewed  Level of consciousness: sedated  Complications: No apparent anesthesia complications

## 2012-02-28 NOTE — Brief Op Note (Signed)
02/28/2012  10:09 AM  PATIENT:  Tracey Patterson  31 y.o. female  PRE-OPERATIVE DIAGNOSIS:  desires sterilization  POST-OPERATIVE DIAGNOSIS:  desires sterilization  PROCEDURE:  Procedure(s) (LRB) with comments: LAPAROSCOPIC TUBAL LIGATION (Bilateral)  SURGEON:  Surgeon(s) and Role:    * Cyril Mourning, MD - Primary  PHYSICIAN ASSISTANT:   ASSISTANTS: none   ANESTHESIA:   general  EBL:  Total I/O In: 1000 [I.V.:1000] Out: 110 [Urine:100; Blood:10]  BLOOD ADMINISTERED:none  DRAINS: none   LOCAL MEDICATIONS USED:  NONE  SPECIMEN:  No Specimen  DISPOSITION OF SPECIMEN:  N/A  COUNTS:  YES  TOURNIQUET:  * No tourniquets in log *  DICTATION: .Other Dictation: Dictation Number [id number]  PLAN OF CARE: Admit to inpatient   PATIENT DISPOSITION:  PACU - hemodynamically stable.   Delay start of Pharmacological VTE agent (>24hrs) due to surgical blood loss or risk of bleeding: not applicable

## 2012-02-28 NOTE — Progress Notes (Signed)
History and physical on the chart. No significant changes Will proceed with Laparoscopic BTL  Consent signed.

## 2012-02-29 ENCOUNTER — Encounter (HOSPITAL_COMMUNITY): Payer: Self-pay | Admitting: Obstetrics and Gynecology

## 2012-03-06 ENCOUNTER — Ambulatory Visit (HOSPITAL_BASED_OUTPATIENT_CLINIC_OR_DEPARTMENT_OTHER)
Admission: RE | Admit: 2012-03-06 | Discharge: 2012-03-06 | Disposition: A | Discharge status: Home / Self Care | Payer: 59 | Source: Ambulatory Visit | Attending: Family Medicine | Admitting: Family Medicine

## 2012-03-06 ENCOUNTER — Emergency Department
Admission: EM | Admit: 2012-03-06 | Discharge: 2012-03-06 | Disposition: A | Discharge status: Home / Self Care | Payer: 59 | Source: Home / Self Care | Attending: Family Medicine | Admitting: Family Medicine

## 2012-03-06 ENCOUNTER — Encounter: Payer: Self-pay | Admitting: *Deleted

## 2012-03-06 ENCOUNTER — Other Ambulatory Visit (HOSPITAL_COMMUNITY): Payer: Self-pay | Admitting: Family Medicine

## 2012-03-06 ENCOUNTER — Telehealth: Payer: Self-pay | Admitting: Family Medicine

## 2012-03-06 ENCOUNTER — Encounter (HOSPITAL_COMMUNITY): Payer: Self-pay

## 2012-03-06 ENCOUNTER — Encounter (HOSPITAL_BASED_OUTPATIENT_CLINIC_OR_DEPARTMENT_OTHER): Payer: Self-pay

## 2012-03-06 DIAGNOSIS — R05 Cough: Secondary | ICD-10-CM

## 2012-03-06 DIAGNOSIS — R7989 Other specified abnormal findings of blood chemistry: Secondary | ICD-10-CM

## 2012-03-06 DIAGNOSIS — J309 Allergic rhinitis, unspecified: Secondary | ICD-10-CM

## 2012-03-06 DIAGNOSIS — I2699 Other pulmonary embolism without acute cor pulmonale: Secondary | ICD-10-CM | POA: Insufficient documentation

## 2012-03-06 DIAGNOSIS — J45909 Unspecified asthma, uncomplicated: Secondary | ICD-10-CM

## 2012-03-06 DIAGNOSIS — R059 Cough, unspecified: Secondary | ICD-10-CM

## 2012-03-06 HISTORY — DX: Anxiety disorder, unspecified: F41.9

## 2012-03-06 MED ORDER — FLUTICASONE-SALMETEROL 100-50 MCG/DOSE IN AEPB: 1.0000 | INHALATION_SPRAY | Freq: Two times a day (BID) | RESPIRATORY_TRACT | Status: DC

## 2012-03-06 MED ORDER — CETIRIZINE HCL 10 MG PO CAPS: 10.0000 mg | ORAL_CAPSULE | Freq: Every day | ORAL | Status: DC

## 2012-03-06 MED ORDER — METHYLPREDNISOLONE ACETATE 80 MG/ML IJ SUSP
80.0000 mg | Freq: Once | INTRAMUSCULAR | Status: AC
  Administered 2012-03-06: 80 mg via INTRAMUSCULAR

## 2012-03-06 MED ORDER — IOHEXOL 350 MG/ML SOLN
100.0000 mL | Freq: Once | INTRAVENOUS | Status: AC | PRN
  Administered 2012-03-06: 80 mL via INTRAVENOUS

## 2012-03-06 MED ORDER — FLUTICASONE PROPIONATE 50 MCG/ACT NA SUSP: 2.0000 | Freq: Every day | NASAL | Status: DC

## 2012-03-06 MED ORDER — PREDNISONE 50 MG PO TABS: ORAL_TABLET | ORAL | Status: DC

## 2012-03-06 NOTE — ED Notes (Signed)
Called and informed pt of mildly + d dimer as well as negative imaging results.  Ct Angio Chest W/cm &/or Wo Cm  03/06/2012  *RADIOLOGY REPORT*  Clinical Data: Pulmonary embolism.  Positive D-dimer.  Tubal ligation 1 week ago.  CT ANGIOGRAPHY CHEST  Technique:  Multidetector CT imaging of the chest using the standard protocol during bolus administration of intravenous contrast. Multiplanar reconstructed images including MIPs were obtained and reviewed to evaluate the vascular anatomy.  Contrast: 33m OMNIPAQUE IOHEXOL 350 MG/ML SOLN  Comparison: 04/24/2011.  Findings: Technically adequate study for evaluation of pulmonary embolus.  Four vessel aortic arch.  No acute aortic abnormality. There is no pulmonary embolism.  Mild bolus dispersion is present. Incidental imaging of the upper abdomen is within normal limits. No adenopathy.  No effusion.  Mild residual anterior mediastinal thymic tissue.  The bones appear within normal limits. Lungs demonstrate mild dependent atelectasis.  IMPRESSION: Negative CTA chest.   Original Report Authenticated By: GDereck Ligas M.D.    [place] Img Lower Bilateral  03/06/2012  *RADIOLOGY REPORT*  Clinical Data: Elevated D-dimer.  Chest pain.  Cough.  VENOUS DUPLEX ULTRASOUND OF BILATERAL LOWER EXTREMITIES  Technique:  Gray-scale sonography with graded compression, as well as color Doppler and duplex ultrasound, were performed to evaluate the deep venous system of both lower extremities from the level of the common femoral vein through the popliteal and proximal calf veins.  Spectral Doppler was utilized to evaluate flow at rest and with distal augmentation maneuvers.  Comparison:  None.  Findings: Right lower extremity: There is no evidence of DVT in the lower extremity.  Normal phasicity, augmentation, and compressibility in the saphenofemoral junction, common femoral vein, femoral vein, deep femoral vein, and popliteal vein.  Left lower extremity: There is no evidence of DVT  in the lower extremity.  Normal phasicity, augmentation, and compressibility in the saphenofemoral junction, common femoral vein, femoral vein, deep femoral vein, and popliteal vein.  IMPRESSION: Negative bilateral lower extremity DVT ultrasound.   Original Report Authenticated By: GDereck Ligas M.D.    Discussed continuation of medical treatment and to call if she had any other questions.   Pt expressed understanding.     SShanda Howells MD 03/06/12 [phone]

## 2012-03-06 NOTE — ED Notes (Signed)
Pt c/o productive cough x 1 day. Pt reports having a tubal ligation 1 wk ago. Denies fever.

## 2012-03-06 NOTE — ED Provider Notes (Addendum)
History     CSN: [id number]  Arrival date & time 03/06/12  5284   First MD Initiated Contact with Patient 03/06/12 [phone]      Chief Complaint  Patient presents with  . Cough    HPI  URI Symptoms Onset: 2 days  Description: rhinorrhea, cough.  Modifying factors:  Pt noted to have been treated for URI induced asthma exacerbation approx  3 weeks ago. Pt was treated with glucocorticoids and zpak. Pt states that sxs resolved. Pt had elective laproscopic BTL last week. Pt states that she noticed worsening throat irritation s/p extubation from anesthesia. Has also been off of allergy medication since procedure. No   Symptoms Nasal discharge:  yes Fever: no Sore throat: mild Cough: yes Wheezing: mild Ear pain: no GI symptoms: no Sick contacts: yes; children   Red Flags  Stiff neck: no Dyspnea: chest tightness  Rash: no Swallowing difficulty: no  Sinusitis Risk Factors Headache/face pain: no Double sickening: no tooth pain: no  Allergy Risk Factors Sneezing: no Itchy scratchy throat: yes Seasonal symptoms: yes  Flu Risk Factors Headache: no muscle aches: no severe fatigue: no    Past Medical History  Diagnosis Date  . Asthma   . History of TMJ disorder   . Anxiety     Past Surgical History  Procedure Date  . Knee arthroscopy 1998    left  . Tonsillectomy   . Laparoscopic tubal ligation 02/28/2012    Procedure: LAPAROSCOPIC TUBAL LIGATION;  Surgeon: Cyril Mourning, MD;  Location: [facility];  Service: Gynecology;  Laterality: Bilateral;    Family History  Problem Relation Age of Onset  . Hypertension Mother   . Hypertension Father     History  Substance Use Topics  . Smoking status: Never Smoker   . Smokeless tobacco: Not on file  . Alcohol Use: Yes    OB History    Grav Para Term Preterm Abortions TAB SAB Ect Mult Living                  Review of Systems  All other systems reviewed and are negative.    Allergies  Sulfa  antibiotics  Home Medications   Current Outpatient Rx  Name  Route  Sig  Dispense  Refill  . NYQUIL PO   Oral   Take by mouth.         . ALBUTEROL SULFATE HFA 108 (90 BASE) MCG/ACT IN AERS   Inhalation   Inhale 2 puffs into the lungs every 4 (four) hours as needed for wheezing (cough, shortness of breath or wheezing.).   1 Inhaler   1   . ALPRAZOLAM 0.25 MG PO TABS   Oral   Take 0.5 mg by mouth as needed.         . AZITHROMYCIN 250 MG PO TABS      Take 2 tabs PO x 1 dose, then 1 tab PO QD x 4 days   6 tablet   0   . BIOTIN PO   Oral   Take 6,000 mcg by mouth daily.         Marland Kitchen CETIRIZINE HCL 10 MG PO TABS   Oral   Take 10 mg by mouth daily. For allergy symptoms         . CETIRIZINE HCL 10 MG PO CAPS   Oral   Take 1 capsule (10 mg total) by mouth daily.   30 capsule   3   . HYDROCOD POLST-CPM POLST  ER 10-8 MG/5ML PO LQCR   Oral   Take 5 mLs by mouth every 12 (twelve) hours as needed (cough).   60 mL   0   . FLUTICASONE PROPIONATE 50 MCG/ACT NA SUSP   Nasal   Place 2 sprays into the nose daily.   16 g   12   . FLUTICASONE-SALMETEROL 100-50 MCG/DOSE IN AEPB   Inhalation   Inhale 1 puff into the lungs 2 (two) times daily.   60 each   6   . HYDROCODONE-ACETAMINOPHEN 5-500 MG PO TABS   Oral   Take 1 tablet by mouth every 6 (six) hours as needed for pain.   30 tablet   0   . IBUPROFEN 200 MG PO TABS   Oral   Take 3 tablets (600 mg total) by mouth every 6 (six) hours as needed for pain.   30 tablet   0   . IBUPROFEN 200 MG PO TABS   Oral   Take 800 mg by mouth daily as needed. For pain         . ADULT MULTIVITAMIN W/MINERALS CH   Oral   Take 1 tablet by mouth daily.         Marland Kitchen PREDNISONE 50 MG PO TABS      1 TAB DAILY X 5 DAYS   5 tablet   0   . PREDNISONE 50 MG PO TABS      1 tab daily x 5 days   5 tablet   0   . DAYQUIL/NYQUIL COLD/FLU RELIEF PO   Oral   Take 1 tablet by mouth 2 (two) times daily as needed. Cold symptoms          . PSEUDOEPHEDRINE HCL ER 120 MG PO TB12   Oral   Take 120 mg by mouth daily. For recent cold           BP 119/80  Pulse 86  Temp 98 F (36.7 C) (Oral)  Resp 16  Ht 5' 5.5" (1.664 m)  Wt 166 lb (75.297 kg)  BMI 27.20 kg/m2  SpO2 100%  LMP 02/19/2012  Physical Exam  Constitutional: She appears well-developed and well-nourished.  HENT:  Head: Normocephalic and atraumatic.  Right Ear: External ear normal.  Left Ear: External ear normal.       +nasal erythema, rhinorrhea bilaterally, + post oropharyngeal erythema    Eyes: Conjunctivae normal are normal. Pupils are equal, round, and reactive to light.  Neck: Normal range of motion. Neck supple.  Cardiovascular: Normal rate and regular rhythm.   Pulmonary/Chest: Effort normal.       Faint wheezes in bases   Abdominal: Soft. Bowel sounds are normal.  Musculoskeletal: Normal range of motion.  Lymphadenopathy:    She has no cervical adenopathy.  Neurological: She is alert.  Skin: Skin is warm.    ED Course  Procedures (including critical care time)  Labs Reviewed - No data to display No results found.   1. Allergic rhinitis   2. Asthma       MDM  Sxs seem most consistent with untreated allergic rhinitis and asthma with secondary traumatic tracheitis from intubation.  Will treat with IM and oral glucocorticoids.  Flonase and zyrtec for allergic rhinitis.  Pt started on low dose advair for asthma (> 2x/wk albuterol use in abscence of URI sxs).  Given that pt recently had surgery, PE is also on the DDx, albeit lower.  Wells score of 1.5. No tachycardia, hypoxia, or increased  WOB on exam.  Will check a d-dimer.  Discussed resp and infectious red flags.  Follow up as needed.     The patient and/or caregiver has been counseled thoroughly with regard to treatment plan and/or medications prescribed including dosage, schedule, interactions, rationale for use, and possible side effects and they verbalize  understanding. Diagnoses and expected course of recovery discussed and will return if not improved as expected or if the condition worsens. Patient and/or caregiver verbalized understanding.             Shanda Howells, MD 03/06/12 1121  Shanda Howells, MD 03/06/12 1550

## 2012-05-13 ENCOUNTER — Other Ambulatory Visit: Payer: Self-pay

## 2012-07-01 ENCOUNTER — Encounter (HOSPITAL_COMMUNITY): Payer: Self-pay | Admitting: Emergency Medicine

## 2012-07-01 ENCOUNTER — Emergency Department (HOSPITAL_COMMUNITY): Payer: PRIVATE HEALTH INSURANCE

## 2012-07-01 ENCOUNTER — Emergency Department (HOSPITAL_COMMUNITY)
Admission: EM | Admit: 2012-07-01 | Discharge: 2012-07-01 | Disposition: A | Discharge status: Home / Self Care | Payer: PRIVATE HEALTH INSURANCE | Attending: Emergency Medicine | Admitting: Emergency Medicine

## 2012-07-01 DIAGNOSIS — Y9269 Other specified industrial and construction area as the place of occurrence of the external cause: Secondary | ICD-10-CM | POA: Insufficient documentation

## 2012-07-01 DIAGNOSIS — Y990 Civilian activity done for income or pay: Secondary | ICD-10-CM | POA: Insufficient documentation

## 2012-07-01 DIAGNOSIS — S9032XA Contusion of left foot, initial encounter: Secondary | ICD-10-CM

## 2012-07-01 DIAGNOSIS — Z79899 Other long term (current) drug therapy: Secondary | ICD-10-CM | POA: Insufficient documentation

## 2012-07-01 DIAGNOSIS — S9030XA Contusion of unspecified foot, initial encounter: Secondary | ICD-10-CM | POA: Insufficient documentation

## 2012-07-01 DIAGNOSIS — Y9389 Activity, other specified: Secondary | ICD-10-CM | POA: Insufficient documentation

## 2012-07-01 DIAGNOSIS — Z8719 Personal history of other diseases of the digestive system: Secondary | ICD-10-CM | POA: Insufficient documentation

## 2012-07-01 DIAGNOSIS — F411 Generalized anxiety disorder: Secondary | ICD-10-CM | POA: Insufficient documentation

## 2012-07-01 DIAGNOSIS — J45909 Unspecified asthma, uncomplicated: Secondary | ICD-10-CM | POA: Insufficient documentation

## 2012-07-01 DIAGNOSIS — IMO0002 Reserved for concepts with insufficient information to code with codable children: Secondary | ICD-10-CM | POA: Insufficient documentation

## 2012-07-01 MED ORDER — NAPROXEN 500 MG PO TABS: 500.0000 mg | ORAL_TABLET | Freq: Two times a day (BID) | ORAL | Status: DC

## 2012-07-01 MED ORDER — IBUPROFEN 400 MG PO TABS
800.0000 mg | ORAL_TABLET | Freq: Once | ORAL | Status: AC
  Administered 2012-07-01: 800 mg via ORAL
  Filled 2012-07-01: qty 2

## 2012-07-01 NOTE — ED Notes (Signed)
Pt st's she ran over her toe with a stretcher.  St's very painful to bear wt.

## 2012-07-01 NOTE — ED Provider Notes (Signed)
History    This chart was scribed for non-physician practitioner working with [name], MD by Kathreen Cornfield, ED Scribe. This patient was seen in room TR07C/TR07C and the patient's care was started at 8:17PM.    CSN: [id number]  Arrival date & time 07/01/12  1959   First MD Initiated Contact with Patient 07/01/12 2017      Chief Complaint  Patient presents with  . Foot Injury    (Consider location/radiation/quality/duration/timing/severity/associated sxs/prior treatment) Patient is a 32 y.o. female presenting with foot injury. The history is provided by the patient. No language interpreter was used.  Foot Injury Location:  Foot Time since incident:  3 hours Injury: yes   Mechanism of injury: crush   Crush injury:    Mechanism:  Unable to specify (Stretcher rolled over left 5th toe. ) Foot location:  L foot Pain details:    Quality:  Aching   Radiates to:  Does not radiate   Severity:  Moderate   Onset quality:  Sudden   Duration:  3 hours   Timing:  Constant   Progression:  Worsening Chronicity:  New Dislocation: no   Foreign body present:  No foreign bodies Tetanus status:  Unknown Prior injury to area:  No Relieved by:  Ice Worsened by:  Bearing weight Ineffective treatments:  None tried Associated symptoms: swelling   Associated symptoms: no decreased ROM, no itching, no muscle weakness, no numbness, no stiffness and no tingling   Risk factors: no frequent fractures     Tracey Patterson is a 32 y.o. female , with a hx of asthma and anxiety, who presents to the Emergency Department complaining of sudden, moderate, foot injury, located at the left 5th toe, onset today (07/01/12 @ 6:00PM).  Associated symptoms include swelling and erythema located at the left 5th toe. The pt reports she was working at her place of employment earlier this evening when a stretcher wheel suddenly ran over her left 5th toe. The pt informs she noticed an immediate pain and inability to bear weight  at the time of the incident. The pt has applied a cold compress to the left 5th toe which provides moderate relief of the pain and swelling associated with the foot injury. Modifying factors include weight bearing and ambulation which intensifies the pain associated with the foot injury.  The pt denies any other injuries at the present point and time.   The pt does not smoke, however, she does drink alcohol.    Past Medical History  Diagnosis Date  . Asthma   . History of TMJ disorder   . Anxiety     Past Surgical History  Procedure Laterality Date  . Knee arthroscopy  1998    left  . Tonsillectomy    . Laparoscopic tubal ligation  02/28/2012    Procedure: LAPAROSCOPIC TUBAL LIGATION;  Surgeon: Cyril Mourning, MD;  Location: [facility];  Service: Gynecology;  Laterality: Bilateral;    Family History  Problem Relation Age of Onset  . Hypertension Mother   . Hypertension Father     History  Substance Use Topics  . Smoking status: Never Smoker   . Smokeless tobacco: Not on file  . Alcohol Use: Yes    OB History   Grav Para Term Preterm Abortions TAB SAB Ect Mult Living                  Review of Systems  Musculoskeletal: Positive for arthralgias. Negative for stiffness.  Skin: Negative  for itching.    Allergies  Sulfa antibiotics  Home Medications   Current Outpatient Rx  Name  Route  Sig  Dispense  Refill  . albuterol (PROVENTIL HFA;VENTOLIN HFA) 108 (90 BASE) MCG/ACT inhaler   Inhalation   Inhale 2 puffs into the lungs every 4 (four) hours as needed for wheezing (cough, shortness of breath or wheezing.).   1 Inhaler   1   . ALPRAZolam (XANAX) 0.25 MG tablet   Oral   Take 0.5 mg by mouth as needed.         Marland Kitchen azithromycin (ZITHROMAX) 250 MG tablet      Take 2 tabs PO x 1 dose, then 1 tab PO QD x 4 days   6 tablet   0   . BIOTIN PO   Oral   Take 6,000 mcg by mouth daily.         . cetirizine (ZYRTEC) 10 MG tablet   Oral   Take 10 mg by mouth  daily. For allergy symptoms         . Cetirizine HCl 10 MG CAPS   Oral   Take 1 capsule (10 mg total) by mouth daily.   30 capsule   3   . chlorpheniramine-HYDROcodone (TUSSIONEX PENNKINETIC ER) 10-8 MG/5ML LQCR   Oral   Take 5 mLs by mouth every 12 (twelve) hours as needed (cough).   60 mL   0   . fluticasone (FLONASE) 50 MCG/ACT nasal spray   Nasal   Place 2 sprays into the nose daily.   16 g   12   . Fluticasone-Salmeterol (ADVAIR DISKUS) 100-50 MCG/DOSE AEPB   Inhalation   Inhale 1 puff into the lungs 2 (two) times daily.   60 each   6   . HYDROcodone-acetaminophen (VICODIN) 5-500 MG per tablet   Oral   Take 1 tablet by mouth every 6 (six) hours as needed for pain.   30 tablet   0   . ibuprofen (ADVIL) 200 MG tablet   Oral   Take 3 tablets (600 mg total) by mouth every 6 (six) hours as needed for pain.   30 tablet   0   . ibuprofen (ADVIL,MOTRIN) 200 MG tablet   Oral   Take 800 mg by mouth daily as needed. For pain         . Multiple Vitamin (MULTIVITAMIN WITH MINERALS) TABS   Oral   Take 1 tablet by mouth daily.         . predniSONE (DELTASONE) 50 MG tablet      1 TAB DAILY X 5 DAYS   5 tablet   0   . predniSONE (DELTASONE) 50 MG tablet      1 tab daily x 5 days   5 tablet   0   . Pseudoeph-Doxylamine-DM-APAP (DAYQUIL/NYQUIL COLD/FLU RELIEF PO)   Oral   Take 1 tablet by mouth 2 (two) times daily as needed. Cold symptoms         . Pseudoeph-Doxylamine-DM-APAP (NYQUIL PO)   Oral   Take by mouth.         . pseudoephedrine (SUDAFED) 120 MG 12 hr tablet   Oral   Take 120 mg by mouth daily. For recent cold           BP 129/87  Pulse 106  Temp(Src) 98.2 F (36.8 C) (Oral)  Resp 14  SpO2 96%  LMP 06/14/2012  Physical Exam  Nursing note and vitals reviewed. Constitutional: She is  oriented to person, place, and time. She appears well-developed and well-nourished. No distress.  HENT:  Head: Normocephalic and atraumatic.  Eyes:  EOM are normal.  Neck: Neck supple. No tracheal deviation present.  Cardiovascular: Normal rate.   Pulmonary/Chest: Effort normal. No respiratory distress.  Musculoskeletal: Normal range of motion.       Feet:  Tenderness over the left 5th MTP and 5th toe.   Neurological: She is alert and oriented to person, place, and time.  Skin: Skin is warm and dry.  Psychiatric: She has a normal mood and affect. Her behavior is normal.    ED Course  Procedures (including critical care time)  DIAGNOSTIC STUDIES: Oxygen Saturation is 96% on room air, normal by my interpretation.    COORDINATION OF CARE:   9:12 PM- Treatment plan discussed with patient. Pt agrees with treatment.     Labs Reviewed - No data to display   Dg Foot Complete Left  07/01/2012  *RADIOLOGY REPORT*  Clinical Data: Pain, swelling, and bruising of the fifth metatarsal and fat on the left foot after injury.  LEFT FOOT - COMPLETE 3+ VIEW  Comparison: None.  Findings: The left foot appears intact. No evidence of acute fracture or subluxation.  No focal bone lesions.  Bone matrix and cortex appear intact.  No abnormal radiopaque densities in the soft tissues.  IMPRESSION: No acute bony abnormalities demonstrated.   Original Report Authenticated By: Lucienne Capers, M.D.       1. Contusion of foot, left, initial encounter       MDM  Injury to foot.  No ecchymosis, deformity or swellinjg. Tender to palpation and difficulty ambulating rest the injured area as much as practical, apply ice packs, splint dispensed and applied, crutches dispensed, see primary care physician in follow up See orders for this visit as documented in the electronic medical record.       I personally performed the services described in this documentation, which was scribed in my presence. The recorded information has been reviewed and is accurate.    Margarita Mail, PA-C 07/04/12 1323

## 2012-07-01 NOTE — ED Notes (Signed)
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 STRETCHER WHEEL RAN OVER HER LEFT 5TH TOE WITH REDDNESS/SWELLING .

## 2012-07-05 NOTE — ED Provider Notes (Signed)
Medical screening examination/treatment/procedure(s) were performed by non-physician practitioner and as supervising physician I was immediately available for consultation/collaboration.   [name], MD 07/05/12 2259

## 2012-07-13 ENCOUNTER — Ambulatory Visit: Payer: Self-pay

## 2012-07-13 ENCOUNTER — Other Ambulatory Visit: Payer: Self-pay | Admitting: Occupational Medicine

## 2012-07-13 DIAGNOSIS — R52 Pain, unspecified: Secondary | ICD-10-CM

## 2012-07-21 ENCOUNTER — Other Ambulatory Visit (HOSPITAL_COMMUNITY): Payer: Self-pay | Admitting: Family Medicine

## 2012-07-21 DIAGNOSIS — T148XXA Other injury of unspecified body region, initial encounter: Secondary | ICD-10-CM

## 2012-07-23 ENCOUNTER — Other Ambulatory Visit (HOSPITAL_COMMUNITY): Payer: Self-pay | Admitting: Family Medicine

## 2012-07-23 ENCOUNTER — Ambulatory Visit (HOSPITAL_COMMUNITY)
Admission: RE | Admit: 2012-07-23 | Discharge: 2012-07-23 | Disposition: A | Discharge status: Home / Self Care | Payer: PRIVATE HEALTH INSURANCE | Source: Ambulatory Visit | Attending: Family Medicine | Admitting: Family Medicine

## 2012-07-23 DIAGNOSIS — T148XXA Other injury of unspecified body region, initial encounter: Secondary | ICD-10-CM

## 2012-07-23 DIAGNOSIS — M65979 Unspecified synovitis and tenosynovitis, unspecified ankle and foot: Secondary | ICD-10-CM | POA: Insufficient documentation

## 2012-07-23 DIAGNOSIS — M79609 Pain in unspecified limb: Secondary | ICD-10-CM | POA: Insufficient documentation

## 2012-07-23 DIAGNOSIS — M659 Synovitis and tenosynovitis, unspecified: Secondary | ICD-10-CM | POA: Insufficient documentation

## 2012-07-23 DIAGNOSIS — R609 Edema, unspecified: Secondary | ICD-10-CM | POA: Insufficient documentation

## 2012-07-24 ENCOUNTER — Ambulatory Visit (HOSPITAL_COMMUNITY): Admission: RE | Admit: 2012-07-24 | Payer: PRIVATE HEALTH INSURANCE | Source: Ambulatory Visit

## 2012-08-01 ENCOUNTER — Encounter: Payer: Self-pay | Admitting: Family Medicine

## 2012-08-01 ENCOUNTER — Ambulatory Visit (INDEPENDENT_AMBULATORY_CARE_PROVIDER_SITE_OTHER): Payer: 59 | Admitting: Family Medicine

## 2012-08-01 ENCOUNTER — Telehealth: Payer: Self-pay | Admitting: Family Medicine

## 2012-08-01 ENCOUNTER — Ambulatory Visit: Payer: Self-pay | Admitting: Family Medicine

## 2012-08-01 VITALS — BP 113/77 | HR 98 | Ht 65.0 in | Wt 154.0 lb

## 2012-08-01 DIAGNOSIS — F4329 Adjustment disorder with other symptoms: Secondary | ICD-10-CM

## 2012-08-01 DIAGNOSIS — F4389 Other reactions to severe stress: Secondary | ICD-10-CM

## 2012-08-01 DIAGNOSIS — G4700 Insomnia, unspecified: Secondary | ICD-10-CM

## 2012-08-01 DIAGNOSIS — F438 Other reactions to severe stress: Secondary | ICD-10-CM

## 2012-08-01 MED ORDER — HYDROXYZINE PAMOATE 25 MG PO CAPS: ORAL_CAPSULE | ORAL | Status: DC

## 2012-08-01 MED ORDER — ZALEPLON 10 MG PO CAPS: ORAL_CAPSULE | ORAL | Status: DC

## 2012-08-01 MED ORDER — ZOLPIDEM TARTRATE 10 MG PO TABS: ORAL_TABLET | ORAL | Status: DC

## 2012-08-01 NOTE — Telephone Encounter (Signed)
Left her a voice mail.  She can come today or tomorrow depending on what's available in our schedule.  PG

## 2012-08-01 NOTE — Patient Instructions (Addendum)
1)  Insomnia - Decrease/Stop Caffeine, Melatonin 5 mg; Sleepy Time/Sweet Dreams, Zyrtec    Insomnia Insomnia is frequent trouble falling and/or staying asleep. Insomnia can be a long term problem or a short term problem. Both are common. Insomnia can be a short term problem when the wakefulness is related to a certain stress or worry. Long term insomnia is often related to ongoing stress during waking hours and/or poor sleeping habits. Overtime, sleep deprivation itself can make the problem worse. Every little thing feels more severe because you are overtired and your ability to cope is decreased. CAUSES   Stress, anxiety, and depression.  Poor sleeping habits.  Distractions such as TV in the bedroom.  Naps close to bedtime.  Engaging in emotionally charged conversations before bed.  Technical reading before sleep.  Alcohol and other sedatives. They may make the problem worse. They can hurt normal sleep patterns and normal dream activity.  Stimulants such as caffeine for several hours prior to bedtime.  Pain syndromes and shortness of breath can cause insomnia.  Exercise late at night.  Changing time zones may cause sleeping problems (jet lag). It is sometimes helpful to have someone observe your sleeping patterns. They should look for periods of not breathing during the night (sleep apnea). They should also look to see how long those periods last. If you live alone or observers are uncertain, you can also be observed at a sleep clinic where your sleep patterns will be professionally monitored. Sleep apnea requires a checkup and treatment. Give your caregivers your medical history. Give your caregivers observations your family has made about your sleep.  SYMPTOMS   Not feeling rested in the morning.  Anxiety and restlessness at bedtime.  Difficulty falling and staying asleep. TREATMENT   Your caregiver may prescribe treatment for an underlying medical disorders. Your caregiver  can give advice or help if you are using alcohol or other drugs for self-medication. Treatment of underlying problems will usually eliminate insomnia problems.  Medications can be prescribed for short time use. They are generally not recommended for lengthy use.  Over-the-counter sleep medicines are not recommended for lengthy use. They can be habit forming.  You can promote easier sleeping by making lifestyle changes such as:  Using relaxation techniques that help with breathing and reduce muscle tension.  Exercising earlier in the day.  Changing your diet and the time of your last meal. No night time snacks.  Establish a regular time to go to bed.  Counseling can help with stressful problems and worry.  Soothing music and white noise may be helpful if there are background noises you cannot remove.  Stop tedious detailed work at least one hour before bedtime. HOME CARE INSTRUCTIONS   Keep a diary. Inform your caregiver about your progress. This includes any medication side effects. See your caregiver regularly. Take note of:  Times when you are asleep.  Times when you are awake during the night.  The quality of your sleep.  How you feel the next day. This information will help your caregiver care for you.  Get out of bed if you are still awake after 15 minutes. Read or do some quiet activity. Keep the lights down. Wait until you feel sleepy and go back to bed.  Keep regular sleeping and waking hours. Avoid naps.  Exercise regularly.  Avoid distractions at bedtime. Distractions include watching television or engaging in any intense or detailed activity like attempting to balance the household checkbook.  Develop a bedtime  ritual. Keep a familiar routine of bathing, brushing your teeth, climbing into bed at the same time each night, listening to soothing music. Routines increase the success of falling to sleep faster.  Use relaxation techniques. This can be using breathing  and muscle tension release routines. It can also include visualizing peaceful scenes. You can also help control troubling or intruding thoughts by keeping your mind occupied with boring or repetitive thoughts like the old concept of counting sheep. You can make it more creative like imagining planting one beautiful flower after another in your backyard garden.  During your day, work to eliminate stress. When this is not possible use some of the previous suggestions to help reduce the anxiety that accompanies stressful situations. MAKE SURE YOU:   Understand these instructions.  Will watch your condition.  Will get help right away if you are not doing well or get worse. Document Released: 03/12/2000 Document Revised: 06/07/2011 Document Reviewed: 04/12/2007 [facility] Patient Information 2013 [place].

## 2012-08-01 NOTE — Progress Notes (Signed)
  Subjective:    Patient ID: Tracey Patterson, female    DOB: 20-Jul-1980, 32 y.o.   MRN: [id number]  Tracey Patterson is here today to discuss her mood.  She recently found out that her husband has been cheating and is devastated. She has increased her intake of Xanax.  She needs to have FMLA forms completed.    Anxiety Symptoms include decreased concentration, depressed mood, excessive worry, insomnia, irritability, nervous/anxious behavior, palpitations, panic and restlessness. Symptoms occur most days. The severity of symptoms is interfering with daily activities, causing significant distress and moderate. The patient sleeps 6 hours per night. The quality of sleep is poor. Nighttime awakenings: one to two.     Review of Systems  Constitutional: Positive for irritability.  Cardiovascular: Positive for palpitations.  Psychiatric/Behavioral: Positive for decreased concentration. The patient is nervous/anxious and has insomnia.    Past Medical History  Diagnosis Date  . Asthma   . History of TMJ disorder   . Anxiety    Family History  Problem Relation Age of Onset  . Hypertension Mother   . Hypertension Father    History   Social History Narrative   Marital Status: Married Aeronautical engineer)    Children:  Son Air cabin crew) Daughter [name])    Pets: Dogs (1) Cats (2)    Living Situation: Lives with  husband and children    Occupation: Radiology [employer]    Education: Bachelor's Degree in Science    Tobacco Use/Exposure:  None    Alcohol Use:  Occasional   Drug Use:  None   Diet:  Regular   Exercise:  None   Hobbies:  Reading              Objective:   Physical Exam  Constitutional: She appears well-nourished. She appears distressed (Her mood is appropriate for what she is going through.  ).  HENT:  Head: Normocephalic.  Eyes: No scleral icterus.  Neck: No thyromegaly present.  Cardiovascular: Normal rate, regular rhythm and normal heart sounds.   Pulmonary/Chest:  Effort normal and breath sounds normal.  Abdominal: There is no tenderness.  Musculoskeletal: She exhibits no edema and no tenderness.  Neurological: She is alert.  Skin: Skin is warm and dry.  Psychiatric: She has a normal mood and affect. Her behavior is normal. Judgment and thought content normal.  She is sad, hurt and feeling very stressed.        Assessment & Plan:   Her FMLA forms required by her employer were completed.    TIME 30 MINUTES:  MORE THAN 50 % OF TIME WAS INVOLVED IN COUNSELING.

## 2012-08-01 NOTE — Telephone Encounter (Signed)
PATIENT IS IN EMOTIONAL UPHEAVAL AND WANTS TO BE SEEN TODAY OR TOMORROW

## 2012-08-13 DIAGNOSIS — G4700 Insomnia, unspecified: Secondary | ICD-10-CM | POA: Insufficient documentation

## 2012-08-13 DIAGNOSIS — F4329 Adjustment disorder with other symptoms: Secondary | ICD-10-CM | POA: Insufficient documentation

## 2012-08-13 NOTE — Assessment & Plan Note (Signed)
She was given prescriptions for Ambien and Sonata. She is to take Ambien for 8 hrs of sleep and Sonata for 4. She understands that she is to take these sparingly. She was warned of the concerns with Xanax and is to try Vistaril instead.

## 2012-09-06 ENCOUNTER — Telehealth: Payer: Self-pay

## 2012-09-06 NOTE — Telephone Encounter (Signed)
Patient called in about Azelastine nasal spray. She said that you had given her a sample of it to try. Tracey Patterson says that it has been helping and would like a prescription sent to the pharmacy on file at this time. Please advise.

## 2012-09-07 ENCOUNTER — Telehealth: Payer: Self-pay

## 2012-09-07 NOTE — Telephone Encounter (Signed)
Prescription has been called in at this time and patient has been notified via vm.

## 2012-09-07 NOTE — Telephone Encounter (Signed)
Message copied by Valarie Merino on Thu Sep 07, 2012  2:28 PM ------      Message from: Ival Bible      Created: Thu Sep 07, 2012  2:21 PM       Tracey Patterson was in the office early March and she was given samples of Dymista.  I gave her a prescription for both Dymista and Astelin at that visit.  Dymista is a combination of Astelin and Flonase. It is not generic but the sample has a $35 co-pay card.  I gave her both because depending on the cost, she is already on the generic for Flonase.  It told her she could take the combination or could just add the generic Astelin.  Please call her and remind her that I gave her prescriptions.  She needs to look for them.  Patients usually find them when they look.  Thanks   ------

## 2012-09-07 NOTE — Telephone Encounter (Addendum)
I contacted Quatisha to find out if she still has the actual prescription and she stated that she doesn't know where it is at this time and I explained that she may want to see if she can locate it, but if not there will be a $10 fee. She is willing to go ahead and pay the $10 fee for lost prescription due to not being able to locate it, so I transferred her to [facility] to complete the transaction. She would like to have the Astelin called in.

## 2012-10-05 ENCOUNTER — Other Ambulatory Visit: Payer: Self-pay | Admitting: Family Medicine

## 2012-10-09 MED ORDER — DULOXETINE HCL 60 MG PO CPEP: 60.0000 mg | ORAL_CAPSULE | Freq: Every day | ORAL | Status: DC

## 2012-10-13 ENCOUNTER — Ambulatory Visit (INDEPENDENT_AMBULATORY_CARE_PROVIDER_SITE_OTHER): Payer: 59 | Admitting: Family Medicine

## 2012-10-13 ENCOUNTER — Encounter: Payer: Self-pay | Admitting: Family Medicine

## 2012-10-13 VITALS — BP 118/83 | HR 84 | Wt 175.0 lb

## 2012-10-13 DIAGNOSIS — R5383 Other fatigue: Secondary | ICD-10-CM

## 2012-10-13 DIAGNOSIS — R232 Flushing: Secondary | ICD-10-CM

## 2012-10-13 DIAGNOSIS — R5381 Other malaise: Secondary | ICD-10-CM

## 2012-10-13 LAB — CBC WITH DIFFERENTIAL/PLATELET
Basophils Absolute: 0.1 10*3/uL (ref 0.0–0.1)
Basophils Relative: 1 % (ref 0–1)
Eosinophils Absolute: 0.2 10*3/uL (ref 0.0–0.7)
Eosinophils Relative: 3 % (ref 0–5)
HCT: 39.6 % (ref 36.0–46.0)
Hemoglobin: 13.5 g/dL (ref 12.0–15.0)
Lymphocytes Relative: 43 % (ref 12–46)
Lymphs Abs: 3.4 10*3/uL (ref 0.7–4.0)
MCH: 31.3 pg (ref 26.0–34.0)
MCHC: 34.1 g/dL (ref 30.0–36.0)
MCV: 91.7 fL (ref 78.0–100.0)
Monocytes Absolute: 0.5 10*3/uL (ref 0.1–1.0)
Monocytes Relative: 6 % (ref 3–12)
Neutro Abs: 3.8 10*3/uL (ref 1.7–7.7)
Neutrophils Relative %: 47 % (ref 43–77)
Platelets: 411 10*3/uL — ABNORMAL HIGH (ref 150–400)
RBC: 4.32 MIL/uL (ref 3.87–5.11)
RDW: 14 % (ref 11.5–15.5)
WBC: 8 10*3/uL (ref 4.0–10.5)

## 2012-10-13 LAB — COMPLETE METABOLIC PANEL WITH GFR
ALT: 67 U/L — ABNORMAL HIGH (ref 0–35)
AST: 41 U/L — ABNORMAL HIGH (ref 0–37)
Albumin: 4.4 g/dL (ref 3.5–5.2)
Alkaline Phosphatase: 130 U/L — ABNORMAL HIGH (ref 39–117)
BUN: 13 mg/dL (ref 6–23)
CO2: 30 mEq/L (ref 19–32)
Calcium: 9.5 mg/dL (ref 8.4–10.5)
Chloride: 104 mEq/L (ref 96–112)
Creat: 0.69 mg/dL (ref 0.50–1.10)
GFR, Est African American: >89 mL/min
GFR, Est Non African American: >89 mL/min
Glucose, Bld: 82 mg/dL (ref 70–99)
Potassium: 4.8 mEq/L (ref 3.5–5.3)
Sodium: 139 mEq/L (ref 135–145)
Total Bilirubin: 0.3 mg/dL (ref 0.3–1.2)
Total Protein: 6.9 g/dL (ref 6.0–8.3)

## 2012-10-13 MED ORDER — ETONOGESTREL-ETHINYL ESTRADIOL 0.12-0.015 MG/24HR VA RING: 1.0000 | VAGINAL_RING | VAGINAL | Status: DC

## 2012-10-13 NOTE — Patient Instructions (Addendum)
1)  Flushing - We are checking some labs to R/O thyroid involvement.  Try the Nuvaring - inserting it the first Sunday following your next period.  In the meantime you could try some Estrovan daily to see if this helps.  If you don't notice any improvement with hormones then you may try to decrease the Cymbalta.     Hormonal Therapy for Women, Frequently Asked Questions WHAT IS HORMONE THERAPY? Hormone therapy (HT), estrogen and progesterone, provides women with the female hormones that decrease and are lost as women get older. When the hormone estrogen is given alone, it is usually referred to as "ERT." When the hormone progesterone is combined with estrogen, it is generally called "HT." Previously this was known as hormone replacement therapy (HRT). Estrogen is a female hormone that brings about changes in various organs in the body. Progesterone is a female hormone that prepares the uterus for a pregnancy each month. During the change-over to menopause ("perimenopause") these hormone levels start to decrease. This causes many uncomfortable symptoms (see below). When the ovaries stop producing estrogen and progesterone, menstrual periods come to an end. At this point, the woman has experienced menopause. Menopause is complete when a woman misses 12 consecutive menstrual periods. WHAT ARE THE BENEFITS OF HORMONE THERAPY? Hormone therapy has been used to relieve the short-term symptoms of menopause. These include:  Hot flashes.  Depression.  Memory loss.  Correcting irregular menstrual periods.  Night sweats.  Tiredness.  Mood disturbances.  Thinning of scalp hair.  Disturbed sleep.  Vaginal dryness.  Painful intercourse.  Loss of breast tissue. Evidence shows that HT may be helpful in preventing colon cancer and bone loss (osteoporosis). WHAT ARE THE SHORT-TERM RISKS OF HORMONE THERAPY?  Some women report side effects from taking Hormone Therapy, including:  Feeling sick to  stomach (nausea).  Fluid retention.  Swollen breasts.  Acne, when taking HT with progesterone.  Unusual vaginal discharge and bleeding (if the uterus is present).  Headaches.  Some women think HT will make them gain weight. Research now shows this is not true. Some women do gain weight during menopause, but this is because their metabolism slows down as they age. They also may not be increasing their amount or level of physical activity as they get older.  Short-term benefits or side effects should become noticeable within days, weeks, or sometimes months after treatment begins. LONG-TERM RISKS These will not be easily noticeable for each individual woman. There are many factors involved that can contribute to long-term risks and side effects. CANCER There is concern that HT can increase the risk of some cancers, including endometrial cancer (lining of the uterus), breast, and certain (but not all) ovarian or cervix cancers, such as endometriod ovarian cancer.  When estrogen is taken alone, it raises the risk of endometrial cancer, if the uterus is still present. Adding progestin with estrogen (HT) can greatly reduce this risk. Progestin is added to prevent the overgrowth (hyperplasia) of cells in the uterine lining. Women who still have an intact uterus are generally given this combined therapy and should not take estrogen hormone alone without progesterone. HT with estrogen and progestin has been linked to an increased risk of invasive breast cancer. Women who use estrogen plus progestin for four years or longer are more likely to develop breast cancer than women who have not used them for as long. This indicates that the therapy may have a cumulative effect. The decision to take HT should be based on an  overall look at the risk and benefits, and how they fit with your personal and genetic health profile. Conditions that increase the underlying risk of developing breast cancer  include:  Family history of breast cancer.  Early age of the first menstrual period (menarche).  Late age of child bearing.  High fat diet.  Late menopause.  Obesity.  Increased breast density on mammograms.  Certain non-cancerous (benign) breast lesions.  Excessive use of alcohol.  Extensive radiation exposure to the chest. These factors need to be considered when deciding to take HT. If you are currently taking HT and have concerns, talk with your caregiver as soon as possible.  BREAST DENSITY Taking both estrogen and progestin also can affect a woman's breast density. Increased breast density from HT makes it hard for a radiologist to read some special breast x-rays (mammograms). This leads to the need for follow-up mammograms, ultrasound or MRI (magnetic resonance imaging), or taking breast tissue samples that are surgically removed (biopsies). Increased density also is a concern because studies have shown that women age 43 and older, whose mammograms show at least 36 percent dense tissue, are at increased risk for breast cancer. However, it is not known if increased breast density due to HT carries the same risk for breast cancer as having naturally dense breasts. About 25 percent of women who use combined HT have an increase in breast density on their mammograms. This is compared to about 8 percent of women taking estrogen alone. One study showed that stopping HT for about 2 weeks before having a mammogram improved the readability of the mammogram. But further research is needed to confirm the usefulness of this approach. HEART DISEASE In the past, taking HT (estrogen plus progestin) was thought to help protect women against heart disease. However, recent findings show that taking HT poses more risks than benefits. HT could increase a woman's risk for:  Heart disease.  Stroke.  Blood clot in the lung (pulmonary embolism).  Breast cancer.  Blood clots in the legs. Women who  have gone through menopause should not be given HT to prevent heart disease and other chronic conditions.  Women who have gone through menopause and who have heart disease, may have a greater risk of another cardiac event (like heart attack) after starting HT, at least in the short-term. For women who have had strokes, their risk for having another stroke goes up when they start taking HT. Hormones are not recommended for women with heart disease or for women who have had a stroke. If you have gone through menopause, talk with your caregiver about whether hormones are right for you. You can check the [place] [employer] [place] website ([place]) for updates on postmenopausal hormone therapy. OTHER RISKS INCLUDE:  Developing high blood pressure.  Developing gallbladder disease.  Women with a fibroid non-cancerous tumor on the uterus may develop pain, bleeding or increase growth of the fibroid. If you are taking HT, watch for signs of trouble. These include:  Abnormal bleeding.  Breast lumps, bloody discharge or red/painful breasts.  Shortness of breath.  Dizziness.  Abdominal pain.  Severe headaches.  Pain in your calves or chest. Report these signs to your caregiver right away. Also, talk with your caregiver about how often you should have an exam. DOES THE DURATION OF TAKING HT AFFECT BREAST CANCER RISK? The relationship between a woman's risk of developing breast cancer and the length of time that she receives HT is not clear. Some women take HT  for only a few years until the worst of their menopausal symptoms have passed. Others have taken it for 10 years or more. Some researchers believe that there is little or no increased risk of breast cancer associated with short-term use of either HT with estrogen alone or estrogen combined with progestin. But long-term use is linked to an increased risk. Women on HT should continue to do monthly self breast exams and  get their mammograms as recommended by their caregiver. WHY IS MENOPAUSAL HORMONE THERAPY USED IN SPITE OF THE CANCER RISK? The known benefits of HT can improve the quality of life for many women, by reducing uncomfortable symptoms, as mentioned above. There also is evidence that HT helps prevent and treats osteoporosis. There is preliminary evidence that it can help prevent other problems associated with age, including colon cancer. The addition of progestin to the treatment has greatly reduced the risk of uterine cancer. ARE THERE OTHER DRUG THERAPIES KNOWN TO TREAT CONDITIONS RELATED TO MENOPAUSE? A class of antidepressant drugs called Selective Serotonin Reuptake Inhibitors (SSRIs) are effective in treating menopause-related symptoms of depression or mood changes. Vitamin E and Clonidine (drug typically used for high blood pressure) can help reduce hot flashes. To prevent osteoporosis, women who are at high risk for bone loss may be given drugs such as bisphosphonates, alendronate, raloxifene, calcium with vitamin D, calcitonin, and prescription medicines such as fasomax or boneva. Lastly, a class of cholesterol-lowering drugs called HMG-CoA-reductase inhibitors (statins) are proven to be effective for reducing risk of heart disease. They are also being explored to prevent osteoporosis. No alternatives to estrogen exist for prevention of colon cancer  a disease for which early evidence suggests HT may be beneficial. WHO SHOULD NOT USE HT?  HT is often not recommended for women who have any of the following conditions:  Vaginal bleeding of an unknown cause.  Suspected breast cancer or history of breast cancer.  History of endometrial or uterine cancer.  Chronic disease of the liver.  History of heart disease.  History of blood clots in the veins or legs or in the lung (venous thrombosis). This includes women who have had thrombosis or blood clots during pregnancy or when taking birth control  pills. Although the risk of blood clots in women is very low, HT increases the risk.  Severe or uncontrolled high blood pressure.  Anyone who may be pregnant. HOW CAN I SORT THROUGH THE BENEFITS AND RISKS TO MAKE A GOOD DECISION ABOUT WHETHER OR NOT TO USE POSTMENOPAUSAL HORMONE THERAPY? Here are several helpful points, summarizing the findings of the Women's Health Initiative [facility]) study:  First, it is important to know that because the study involved healthy women, only a small number of them had either a negative or positive effect from estrogen plus progestin therapy. The percentages describe what would happen to a whole population, not necessarily to any individual woman. Second, remember that percentages are not fate. Whether expressing risks or benefits, a percentage does not mean you will develop a disease. Many factors affect that likelihood, including:  Your lifestyle.  Environmental factors.  Heredity.  Your personal medical history. Realize that most treatments carry risks and benefits. No one can make a treatment choice for you. Talk with your caregiver and decide what is best for your health and quality of life. Begin by finding out your family history and your personal risk profile for:  Heart disease.  Stroke.  Breast cancer.  Osteoporosis.  Colorectal cancer.  Blood clots.  Other medical conditions. Document Released: 12/12/2002 Document Revised: 06/07/2011 Document Reviewed: 01/13/2009 [facility] Patient Information 2014 [place].

## 2012-10-13 NOTE — Progress Notes (Signed)
  Subjective:    Patient ID: Tracey Patterson, female    DOB: 08/19/1980, 32 y.o.   MRN: [id number]  HPI  Tracey Patterson is here today to discuss the night sweats that she has had since starting on Cymbalta several months ago.  She is concerned that she may be entering premature menopause.    Review of Systems  Constitutional: Positive for diaphoresis. Negative for fever.  Endocrine: Negative for cold intolerance and heat intolerance.  Genitourinary: Positive for menstrual problem.   Past Medical History  Diagnosis Date  . Asthma   . History of TMJ disorder   . Anxiety    Family History  Problem Relation Age of Onset  . Hypertension Mother   . 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 No distress.  HENT:  Head: Normocephalic.  Eyes: No scleral icterus.  Neck: No thyromegaly present.  Cardiovascular: Normal rate, regular rhythm and normal heart sounds.   Pulmonary/Chest: Effort normal and breath sounds normal.  Abdominal: There is no tenderness.  Musculoskeletal: She exhibits no edema and no tenderness.  Neurological: She is alert.  Skin: Skin is warm and dry.  Psychiatric: She has a normal mood and affect. Her behavior is normal. Judgment and thought content normal.          Assessment & Plan:

## 2012-10-14 LAB — T3, FREE: T3, Free: 3 pg/mL (ref 2.3–4.2)

## 2012-10-14 LAB — TSH: TSH: 1.332 u[IU]/mL (ref 0.350–4.500)

## 2012-10-14 LAB — T4, FREE: Free T4: 1.01 ng/dL (ref 0.80–1.80)

## 2012-10-20 ENCOUNTER — Telehealth: Payer: Self-pay | Admitting: *Deleted

## 2012-10-20 NOTE — Telephone Encounter (Signed)
Tracey Patterson's CMP was abnormal.  Tried to contact her to schedule an appt with Dr Dion Saucier.  I left her a voice mail at her home # with an appt date.  She may call back to reschedule but she needs to be seen sooner than her original appt of 8/27. PG

## 2012-10-25 ENCOUNTER — Ambulatory Visit: Payer: Self-pay | Admitting: Family Medicine

## 2012-10-26 DIAGNOSIS — R5383 Other fatigue: Secondary | ICD-10-CM | POA: Insufficient documentation

## 2012-10-26 DIAGNOSIS — R232 Flushing: Secondary | ICD-10-CM | POA: Insufficient documentation

## 2012-10-26 DIAGNOSIS — R5381 Other malaise: Secondary | ICD-10-CM | POA: Insufficient documentation

## 2012-10-26 NOTE — Assessment & Plan Note (Signed)
Checking her thyroid function.

## 2012-10-26 NOTE — Assessment & Plan Note (Signed)
Tracey Patterson continues to have regular periods so I told her that she is probably not going into menopause buy her hormones might just be lower than her body wants it to be.  She is going to try the [place].

## 2012-10-27 ENCOUNTER — Ambulatory Visit (INDEPENDENT_AMBULATORY_CARE_PROVIDER_SITE_OTHER): Payer: 59 | Admitting: Family Medicine

## 2012-10-27 ENCOUNTER — Encounter: Payer: Self-pay | Admitting: Family Medicine

## 2012-10-27 VITALS — BP 117/80 | HR 85 | Wt 182.0 lb

## 2012-10-27 DIAGNOSIS — R232 Flushing: Secondary | ICD-10-CM

## 2012-10-27 DIAGNOSIS — F411 Generalized anxiety disorder: Secondary | ICD-10-CM

## 2012-10-27 DIAGNOSIS — R7401 Elevation of levels of liver transaminase levels: Secondary | ICD-10-CM

## 2012-10-27 DIAGNOSIS — IMO0001 Reserved for inherently not codable concepts without codable children: Secondary | ICD-10-CM

## 2012-10-27 MED ORDER — DULOXETINE HCL 60 MG PO CPEP: 60.0000 mg | ORAL_CAPSULE | Freq: Every day | ORAL | Status: DC

## 2012-10-27 NOTE — Progress Notes (Signed)
  Subjective:    Patient ID: Tracey Patterson, female    DOB: June 22, 1980, 32 y.o.   MRN: [id number]  HPI  Tracey Patterson is here today to go over her recent lab results and to discuss the issues listed below:    1)  Flushing:  She continues to struggle with this.  She has not started on the Nuvaring yet because she has not started her period.  She continues to have both hot flashes and night sweats.    2)  Anxiety:  She feels less anxious on the Cymbalta and has the Vistaril to take occasionally if needed.   3)  Myalgia:  Her generalized pain is improved on Cymbalta.      Review of Systems  Constitutional: Negative.   HENT: Negative.   Eyes: Negative.   Respiratory: Negative.   Cardiovascular: Negative.   Gastrointestinal: Negative.   Endocrine: Positive for heat intolerance.  Genitourinary: Negative.   Musculoskeletal: Negative.   Skin: Negative.   Allergic/Immunologic: Negative.   Neurological: Negative.   Hematological: Negative.   Psychiatric/Behavioral:       Night sweats The Ambien has really helped her sleep    Past Medical History  Diagnosis Date  . Asthma   . History of TMJ disorder   . Anxiety     Family History  Problem Relation Age of Onset  . Hypertension Mother   . 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 No distress.  HENT:  Head: Normocephalic.  Eyes: No scleral icterus.  Neck: No thyromegaly present.  Cardiovascular: Normal rate, regular rhythm  and normal heart sounds.   Pulmonary/Chest: Effort normal and breath sounds normal.  Abdominal: There is no tenderness.  Musculoskeletal: She exhibits no edema and no tenderness.  Neurological: She is alert.  Skin: Skin is warm and dry.  Psychiatric: She has a normal mood and affect. Her behavior is normal. Judgment and thought content normal.          Assessment & Plan:

## 2012-10-27 NOTE — Patient Instructions (Addendum)
1)  Elevated Liver Enzymes - Decrease intake of alcohol, NSAIDS/Tylenol, Fatty Foods and add Vitamin E 400 IU.  We'll recheck your LFTs in 3 months.    2)  Flushing - Another thing you can try in addition to the Nuvaring is adding SOY to your diet.  Look into [employer]ival Soy (Protein Shakes/Bars) or other soy products to help with symptoms. Tracey Patterson is also another option.    Fatty Liver Fatty liver is the accumulation of fat in liver cells. It is also called hepatosteatosis or steatohepatitis. It is normal for your liver to contain some fat. If fat is more than 5 to 10% of your liver's weight, you have fatty liver.  There are often no symptoms (problems) for years while damage is still occurring. People often learn about their fatty liver when they have medical tests for other reasons. Fat can damage your liver for years or even decades without causing problems. When it becomes severe, it can cause fatigue, weight loss, weakness, and confusion. This makes you more likely to develop more serious liver problems. The liver is the largest organ in the body. It does a lot of work and often gives no warning signs when it is sick until late in a disease. The liver has many important jobs including:  Breaking down foods.  Storing vitamins, iron, and other minerals.  Making proteins.  Making bile for food digestion.  Breaking down many products including medications, alcohol and some poisons. CAUSES  There are a number of different conditions, medications, and poisons that can cause a fatty liver. Eating too many calories causes fat to build up in the liver. Not processing and breaking fats down normally may also cause this. Certain conditions, such as obesity, diabetes, and high triglycerides also cause this. Most fatty liver patients tend to be middle-aged and over weight.  Some causes of fatty liver are:  Alcohol over consumption.  Malnutrition.  Steroid use.  Valproic acid  toxicity.  Obesity.  Cushing's syndrome.  Poisons.  Tetracycline in high dosages.  Pregnancy.  Diabetes.  Hyperlipidemia.  Rapid weight loss. Some people develop fatty liver even having none of these conditions. SYMPTOMS  Fatty liver most often causes no problems. This is called asymptomatic.  It can be diagnosed with blood tests and also by a liver biopsy.  It is one of the most common causes of minor elevations of liver enzymes on routine blood tests.  Specialized Imaging of the liver using ultrasound, CT (computed tomography) scan, or MRI (magnetic resonance imaging) can suggest a fatty liver but a biopsy is needed to confirm it.  A biopsy involves taking a small sample of liver tissue. This is done by using a needle. It is then looked at under a microscope by a specialist. TREATMENT  It is important to treat the cause. Simple fatty liver without a medical reason may not need treatment.  Weight loss, fat restriction, and exercise in overweight patients produces inconsistent results but is worth trying.  Fatty liver due to alcohol toxicity may not improve even with stopping drinking.  Good control of diabetes may reduce fatty liver.  Lower your triglycerides through diet, medication or both.  Eat a balanced, healthy diet.  Increase your physical activity.  Get regular checkups from a liver specialist.  There are no medical or surgical treatments for a fatty liver or NASH, but improving your diet and increasing your exercise may help prevent or reverse some of the damage. PROGNOSIS  Fatty liver may cause  no damage or it can lead to an inflammation of the liver. This is, called steatohepatitis. When it is linked to alcohol abuse, it is called alcoholic steatohepatitis. It often is not linked to alcohol. It is then called nonalcoholic steatohepatitis, or NASH. Over time the liver may become scarred and hardened. This condition is called cirrhosis. Cirrhosis is serious  and may lead to liver failure or cancer. NASH is one of the leading causes of cirrhosis. About 10-20% of Americans have fatty liver and a smaller 2-5% has NASH. Document Released: 04/30/2005 Document Revised: 06/07/2011 Document Reviewed: 06/23/2005 [facility] Patient Information 2014 [place].

## 2012-10-30 DIAGNOSIS — IMO0001 Reserved for inherently not codable concepts without codable children: Secondary | ICD-10-CM | POA: Insufficient documentation

## 2012-10-30 NOTE — Assessment & Plan Note (Signed)
Her anxiety is controlled on the Cymbalta so she would like to remain on it.

## 2012-10-30 NOTE — Assessment & Plan Note (Signed)
She is to see how her symptoms are on the Nuvaring. We also discussed her trying Renaee Munda and she may increase her intake of soy.

## 2012-10-30 NOTE — Assessment & Plan Note (Signed)
We discussed her elevated transaminases.  She has been under a lot of stress over the past few months and admits that her alcohol intake has been more than she normally drinks. She is to limit her alcohol and medications that might raise her level like Tylenol and Ibuprofen.  We also discussed the possibility of a fatty liver so she is to limit her intake of fat.  She is to also take Vitamin E and we'll recheck her levels in 3 months.  If her values go up too much, we may need to reconsider the Cymbalta.

## 2012-10-30 NOTE — Assessment & Plan Note (Signed)
Her pain is better on Cymbalta.

## 2012-11-22 ENCOUNTER — Ambulatory Visit: Payer: Self-pay | Admitting: Family Medicine

## 2012-12-13 ENCOUNTER — Ambulatory Visit: Payer: Self-pay | Admitting: Family Medicine

## 2012-12-14 ENCOUNTER — Encounter: Payer: Self-pay | Admitting: Family Medicine

## 2012-12-14 ENCOUNTER — Ambulatory Visit (INDEPENDENT_AMBULATORY_CARE_PROVIDER_SITE_OTHER): Payer: 59 | Admitting: Family Medicine

## 2012-12-14 VITALS — BP 127/83 | HR 80 | Wt 185.0 lb

## 2012-12-14 DIAGNOSIS — K219 Gastro-esophageal reflux disease without esophagitis: Secondary | ICD-10-CM | POA: Insufficient documentation

## 2012-12-14 DIAGNOSIS — F32A Depression, unspecified: Secondary | ICD-10-CM | POA: Insufficient documentation

## 2012-12-14 DIAGNOSIS — F329 Major depressive disorder, single episode, unspecified: Secondary | ICD-10-CM

## 2012-12-14 DIAGNOSIS — J309 Allergic rhinitis, unspecified: Secondary | ICD-10-CM

## 2012-12-14 DIAGNOSIS — J45909 Unspecified asthma, uncomplicated: Secondary | ICD-10-CM | POA: Insufficient documentation

## 2012-12-14 DIAGNOSIS — R7402 Elevation of levels of lactic acid dehydrogenase (LDH): Secondary | ICD-10-CM

## 2012-12-14 DIAGNOSIS — J302 Other seasonal allergic rhinitis: Secondary | ICD-10-CM | POA: Insufficient documentation

## 2012-12-14 DIAGNOSIS — F341 Dysthymic disorder: Secondary | ICD-10-CM

## 2012-12-14 DIAGNOSIS — R7401 Elevation of levels of liver transaminase levels: Secondary | ICD-10-CM | POA: Insufficient documentation

## 2012-12-14 MED ORDER — VENLAFAXINE HCL ER 75 MG PO CP24: ORAL_CAPSULE | ORAL | Status: DC

## 2012-12-14 MED ORDER — CLONAZEPAM 0.5 MG PO TABS: ORAL_TABLET | ORAL | Status: DC

## 2012-12-14 NOTE — Progress Notes (Signed)
CC: Tracey Patterson is a 32 y.o. female is here for Establish Care   Subjective: HPI:  Pleasant 32 year old here to establish care  Patient with worsening depression and anxiety described as moderate in severity but has been present for the past 4-6 months. She reports a history of posttraumatic stress disorder stemming from her sister's death in an accident 6 years ago, she also had postpartum depression about a decade ago. She was recently on Cymbalta however this caused intolerable constipation she stopped this one month ago she is back to having normal bowel movements painless. Subjective depression and anxiety symptoms have been worsened by her husband's infidelity along with job position shuffling over the past year. Symptoms seem to improve with stress eating however only temporarily and the long run this makes matters worse. She's gained over 30 pounds in the last year unintentionally, she does not workout regularly, she does not watch what she eats.  She admits that she eats a fatty or rich meal it often causes an upset stomach which is usually followed by large amounts of starches to calm her stomach.  She's been to marriage counseling with her husband which didn't seem to help or worsen her anxiety and depression.  Anxiety is described as occurring daily involving blowing little issues out of proportion.   Patient has a history of transaminitis she admits that she was drinking more than 2 drinks a night for 2-3 months prior to metabolic panel obtained last month. She is not cut down to 2 drinks a week. She complains of right upper quadrant fullness is present when sitting forward not related to dietary habits. She denies nausea, vomiting, back pain, fevers, chills, nor jaundice  She describes chronic allergies to dust and pollen. She's currently using Zyrtec, Singulair, nasal steroid. She reports daily nasal congestion does not interfere with quality of life. She denies wheezing, cough,  shortness of breath nor itching  Review of Systems - General ROS: negative for - chills, fever, night sweats,  weight loss Ophthalmic ROS: negative for - decreased vision Psychological ROS: negative for - up to 1 to harm self or others ENT ROS: negative for - hearing change, nasal congestion, tinnitus or allergies Hematological and Lymphatic ROS: negative for - bleeding problems, bruising or swollen lymph nodes Breast ROS: negative Respiratory ROS: no cough, shortness of breath, or wheezing Cardiovascular ROS: no chest pain or dyspnea on exertion Gastrointestinal ROS:no change in bowel habits, or black or bloody stools Genito-Urinary ROS: negative for - genital discharge, genital ulcers, incontinence or abnormal bleeding from genitals Musculoskeletal ROS: negative for - joint pain or muscle pain Neurological ROS: negative for - headaches or memory loss Dermatological ROS: negative for lumps, mole changes, rash and skin lesion changes  Past Medical History  Diagnosis Date  . Asthma   . History of TMJ disorder   . Anxiety      Family History  Problem Relation Age of Onset  . Hypertension Mother   . Hypertension Father      History  Substance Use Topics  . Smoking status: Never Smoker   . Smokeless tobacco: Never Used  . Alcohol Use: Yes     Objective: Filed Vitals:   12/14/12 1055  BP: 127/83  Pulse: 80    General: Alert and Oriented, No Acute Distress HEENT: Pupils equal, round, reactive to light. Conjunctivae clear.  External ears unremarkable, canals clear with intact TMs with appropriate landmarks.  Middle ear appears open without effusion. Pink inferior turbinates.  Moist  mucous membranes, pharynx without inflammation nor lesions.  Neck supple without palpable lymphadenopathy nor abnormal masses. Lungs: Clear to auscultation bilaterally, no wheezing/ronchi/rales.  Comfortable work of breathing. Good air movement. Cardiac: Regular rate and rhythm. Normal S1/S2.  No  murmurs, rubs, nor gallops.   Abdomen: Normal bowel sounds, soft and non tender without palpable masses. Mental Status: No depression, anxiety, nor agitation. Skin: Warm and dry.  Assessment & Plan: Tracey Patterson was seen today for establish care.  Diagnoses and associated orders for this visit:  Anxiety and depression - venlafaxine XR (EFFEXOR XR) 75 MG 24 hr capsule; Start with single tab daily for one week then two tabs every morning thereafter. - clonazePAM (KLONOPIN) 0.5 MG tablet; Half to full tab daily as needed for anxiety.  Seasonal allergies  Transaminitis - Comp Met (CMET)    Anxiety and depression: Start Effexor taper up over the first week, Xanax in the past would cause her to fall asleep at inappropriate times of the day therefore try clonazepam only on as-needed basis for breakthrough anxiety Seasonal allergies: Controlled continue nasal steroid, Zyrtec, Singulair Transaminitis: Repeat liver function if enzymes continue to be elevated will order abdominal ultrasound complete  Return in about 4 weeks (around 01/11/2013).

## 2012-12-15 LAB — COMPREHENSIVE METABOLIC PANEL
AST: 17 U/L (ref 0–37)
Albumin: 4.2 g/dL (ref 3.5–5.2)
Alkaline Phosphatase: 95 U/L (ref 39–117)
BUN: 11 mg/dL (ref 6–23)
Potassium: 3.9 mEq/L (ref 3.5–5.3)
Sodium: 136 mEq/L (ref 135–145)
Total Protein: 7.1 g/dL (ref 6.0–8.3)

## 2013-01-15 ENCOUNTER — Ambulatory Visit (INDEPENDENT_AMBULATORY_CARE_PROVIDER_SITE_OTHER): Payer: 59 | Admitting: Family Medicine

## 2013-01-15 ENCOUNTER — Encounter: Payer: Self-pay | Admitting: Family Medicine

## 2013-01-15 VITALS — BP 126/76 | HR 80 | Wt 181.0 lb

## 2013-01-15 DIAGNOSIS — F32A Depression, unspecified: Secondary | ICD-10-CM

## 2013-01-15 DIAGNOSIS — J309 Allergic rhinitis, unspecified: Secondary | ICD-10-CM

## 2013-01-15 DIAGNOSIS — F341 Dysthymic disorder: Secondary | ICD-10-CM

## 2013-01-15 DIAGNOSIS — F329 Major depressive disorder, single episode, unspecified: Secondary | ICD-10-CM

## 2013-01-15 DIAGNOSIS — J302 Other seasonal allergic rhinitis: Secondary | ICD-10-CM

## 2013-01-15 MED ORDER — VENLAFAXINE HCL ER 75 MG PO CP24: ORAL_CAPSULE | ORAL | Status: DC

## 2013-01-15 MED ORDER — MONTELUKAST SODIUM 10 MG PO TABS: 10.0000 mg | ORAL_TABLET | Freq: Every day | ORAL | Status: DC

## 2013-01-15 NOTE — Progress Notes (Signed)
CC: Tracey Patterson is a 32 y.o. female is here for f/u anxiety and depression   Subjective: HPI:  Followup anxiety and depression: Since taking Effexor 150 mg daily she reports subjective improvement of depression now mild in severity is on a daily basis. Nothing particularly makes it better or worse other than above. She reports she is still napping after children are off to school however this has not coincided with depressive symptoms in the past she states that she somewhat just enjoys napping.  Stress eating has also improved she is no longer eating to manage her depression or anxiety.  She denies nonrestorative sleep, snoring, mental disturbance other than mild anxiety.  States that anxiety is worse after children come home from school only if they are wound up. She's been taking half a Klonopin to a whole Klonopin less than most days of the week noticing this causes her to be somewhat fatigued but will completely get rid of any anxiety. She thinks Effexor is giving her an odd space out feeling that occurs only around lunchtime for about an hour.  She is requesting refills on Singulair for her chronic allergic rhinitis.  Review Of Systems Outlined In HPI  Past Medical History  Diagnosis Date  . Asthma   . History of TMJ disorder   . Anxiety      Family History  Problem Relation Age of Onset  . Hypertension Mother   . Hypertension Father      History  Substance Use Topics  . Smoking status: Never Smoker   . Smokeless tobacco: Never Used  . Alcohol Use: Yes     Objective: Filed Vitals:   01/15/13 0910  BP: 126/76  Pulse: 80    General: Alert and Oriented, No Acute Distress HEENT: Pupils equal, round, reactive to light. Conjunctivae clear.  External ears unremarkable, canals clear with intact TMs with appropriate landmarks.  Middle ear appears open without effusion. Pink inferior turbinates.  Moist mucous membranes, pharynx without inflammation nor lesions.  Neck supple  without palpable lymphadenopathy nor abnormal masses. Lungs: Clear comfortable work of breathing Cardiac: Regular rate and rhythm.  Extremities: No peripheral edema.  Strong peripheral pulses.  Mental Status: No depression, anxiety, nor agitation. Skin: Warm and dry.  Assessment & Plan: Tracey Patterson was seen today for f/u anxiety and depression.  Diagnoses and associated orders for this visit:  Anxiety and depression - venlafaxine XR (EFFEXOR XR) 75 MG 24 hr capsule; One to two capsules every morning depending on side effects.  Seasonal allergies - montelukast (SINGULAIR) 10 MG tablet; Take 1 tablet (10 mg total) by mouth at bedtime.    Anxiety and depression: Improved encourage her to try only 75 mg of Effexor to see if side effects are minimized however go back to 150 depression worsens Seasonal allergies: Refill Singulair  Return in about 3 months (around 04/17/2013).

## 2013-01-19 ENCOUNTER — Other Ambulatory Visit: Payer: Self-pay

## 2013-01-25 ENCOUNTER — Ambulatory Visit: Payer: Self-pay | Admitting: Family Medicine

## 2013-02-01 ENCOUNTER — Other Ambulatory Visit: Payer: Self-pay

## 2013-04-02 ENCOUNTER — Other Ambulatory Visit: Payer: Self-pay | Admitting: Family Medicine

## 2013-04-02 ENCOUNTER — Ambulatory Visit (INDEPENDENT_AMBULATORY_CARE_PROVIDER_SITE_OTHER): Payer: 59 | Admitting: Family Medicine

## 2013-04-02 ENCOUNTER — Encounter: Payer: Self-pay | Admitting: Family Medicine

## 2013-04-02 VITALS — BP 127/82 | HR 89 | Temp 97.9°F | Wt 189.0 lb

## 2013-04-02 DIAGNOSIS — B9689 Other specified bacterial agents as the cause of diseases classified elsewhere: Secondary | ICD-10-CM

## 2013-04-02 DIAGNOSIS — A499 Bacterial infection, unspecified: Secondary | ICD-10-CM

## 2013-04-02 DIAGNOSIS — J329 Chronic sinusitis, unspecified: Secondary | ICD-10-CM

## 2013-04-02 MED ORDER — AMOXICILLIN-POT CLAVULANATE 500-125 MG PO TABS: ORAL_TABLET | ORAL | Status: AC

## 2013-04-02 NOTE — Progress Notes (Signed)
CC: Tracey Patterson is a 33 y.o. female is here for Sinusitis   Subjective: HPI:  Complains of facial pressure localizes beneath both eyes right greater than left along with nasal congestion and subjective postnasal drip that has been present for the past 2 weeks worsening on a daily basis.   Symptoms are worse in the morning but present to a moderate degree throughout the day. Not responding to over-the-counter cough and cold medication. Denies fevers, chills, shortness of breath, chest tightness, motor sensory disturbances  Review Of Systems Outlined In HPI  Past Medical History  Diagnosis Date  . Asthma   . History of TMJ disorder   . Anxiety      Family History  Problem Relation Age of Onset  . Hypertension Mother   . Hypertension Father      History  Substance Use Topics  . Smoking status: Never Smoker   . Smokeless tobacco: Never Used  . Alcohol Use: Yes     Objective: Filed Vitals:   04/02/13 1130  BP: 127/82  Pulse: 89  Temp: 97.9 F (36.6 C)    General: Alert and Oriented, No Acute Distress HEENT: Pupils equal, round, reactive to light. Conjunctivae clear.  External ears unremarkable, canals clear with intact TMs with appropriate landmarks.  Middle ear appears open without effusion. Pink inferior turbinates.  Moist mucous membranes, pharynx without inflammation nor lesions however moderate post nasal drip.  Neck supple without palpable lymphadenopathy nor abnormal masses. Maxillary sinus tenderness bilaterally Lungs: Clear to auscultation bilaterally, no wheezing/ronchi/rales.  Comfortable work of breathing. Good air movement. Mental Status: No depression, anxiety, nor agitation. Skin: Warm and dry.  Assessment & Plan: Tracey Patterson was seen today for sinusitis.  Diagnoses and associated orders for this visit:  Bacterial sinusitis - amoxicillin-clavulanate (AUGMENTIN) 500-125 MG per tablet; Take one by mouth every 8 hours for ten total days.    Bacterial  sinusitis: Start Augmentin consider Alka-Seltzer cold and sinus for symptom control.  Return if symptoms worsen or fail to improve.

## 2013-04-11 ENCOUNTER — Other Ambulatory Visit: Payer: Self-pay | Admitting: Family Medicine

## 2013-04-11 NOTE — Telephone Encounter (Signed)
Tracey Patterson, Will you please let [name] know that refill sent to [facility].

## 2013-04-17 ENCOUNTER — Ambulatory Visit (INDEPENDENT_AMBULATORY_CARE_PROVIDER_SITE_OTHER): Payer: 59 | Admitting: Family Medicine

## 2013-04-17 ENCOUNTER — Encounter: Payer: Self-pay | Admitting: Family Medicine

## 2013-04-17 VITALS — BP 133/88 | HR 103 | Wt 187.0 lb

## 2013-04-17 DIAGNOSIS — F419 Anxiety disorder, unspecified: Principal | ICD-10-CM

## 2013-04-17 DIAGNOSIS — F341 Dysthymic disorder: Secondary | ICD-10-CM

## 2013-04-17 DIAGNOSIS — J329 Chronic sinusitis, unspecified: Secondary | ICD-10-CM

## 2013-04-17 DIAGNOSIS — F32A Depression, unspecified: Secondary | ICD-10-CM

## 2013-04-17 DIAGNOSIS — F329 Major depressive disorder, single episode, unspecified: Secondary | ICD-10-CM

## 2013-04-17 MED ORDER — PREDNISONE 20 MG PO TABS: 20.0000 mg | ORAL_TABLET | Freq: Every day | ORAL | Status: DC

## 2013-04-17 MED ORDER — CLONAZEPAM 0.5 MG PO TABS: ORAL_TABLET | ORAL | Status: DC

## 2013-04-17 NOTE — Progress Notes (Signed)
CC: Tracey Patterson is a 33 y.o. female is here for f/u anxiety and depression   Subjective: HPI:  Followup anxiety and depression: Continues on Effexor 150 mg a day she's no longer having any known side effects from this medication. She states it is helping with quality of life most days of the week. She still has difficulty with letting things go we comes to stress at work with coworkers or helping deal with husbands own issues with subjective anxiety and depression. She rarely takes Klonopin uses it less than once a week. She's using it only for overwhelming feelings of anxiety and irritability along with difficulty sleeping. She admits there is probably room for improvement with respect to quality of life of depression and anxiety. There has been no thoughts of going to harm self or others.  She reports increased sleeping no change in appetite or hobbies. No mental disturbance other than the above  She reports there was mild improvement with her facial pressure and nasal congestion she is no longer dealing with a cough however since stopping antibiotics last week there is still mild facial pressure in the forehead and beneath both eyes accompanied by mild nasal congestion.  Still using nasal steroid spray and Singulair. Denies fevers, chills, motor sensory disturbances nor shortness of breath or wheezing      Review Of Systems Outlined In HPI  Past Medical History  Diagnosis Date  . Asthma   . History of TMJ disorder   . Anxiety      Family History  Problem Relation Age of Onset  . Hypertension Mother   . Hypertension Father      History  Substance Use Topics  . Smoking status: Never Smoker   . Smokeless tobacco: Never Used  . Alcohol Use: Yes     Objective: Filed Vitals:   04/17/13 0942  BP: 133/88  Pulse: 103    General: Alert and Oriented, No Acute Distress HEENT: Pupils equal, round, reactive to light. Conjunctivae clear.  External ears unremarkable, canals clear  with intact TMs with appropriate landmarks.  Middle ear appears open without effusion. Pink inferior turbinates with mild mucoid discharge.  Moist mucous membranes, pharynx without inflammation nor lesions.  Neck supple without palpable lymphadenopathy nor abnormal masses. Lungs: Clear to auscultation bilaterally, no wheezing/ronchi/rales.  Comfortable work of breathing. Good air movement. Extremities: No peripheral edema.  Strong peripheral pulses.  Mental Status:  mild depression with no  anxiety, nor agitation. Skin: Warm and dry.  Assessment & Plan: [name] was seen today for f/u anxiety and depression.  Diagnoses and associated orders for this visit:  Anxiety and depression - clonazePAM (KLONOPIN) 0.5 MG tablet; Half to full tab daily as needed for anxiety.  Sinusitis - predniSONE (DELTASONE) 20 MG tablet; Take 1 tablet (20 mg total) by mouth daily with breakfast.    Anxiety and depression: Uncontrolled of encouraged her to increase Effexor another 75 mg total 225 mg a day for the next one to 2 weeks and if improving symptoms without side effects continue on this regimen and call for formal refill, if side effects are worse than dealing with current anxiety and depression return to 150 mg.  Continue sparing use of Klonopin Sinusitis: Low-dose prednisone to help clear up remaining sinus inflammation no current indication for repeat antibiotic  Return in about 6 months (around 10/15/2013) for 3-6 months Anxiety Followup.

## 2013-05-01 ENCOUNTER — Telehealth: Payer: Self-pay | Admitting: *Deleted

## 2013-05-01 DIAGNOSIS — F32A Depression, unspecified: Secondary | ICD-10-CM

## 2013-05-01 DIAGNOSIS — F329 Major depressive disorder, single episode, unspecified: Secondary | ICD-10-CM

## 2013-05-01 MED ORDER — VENLAFAXINE HCL ER 75 MG PO CP24: ORAL_CAPSULE | ORAL | Status: DC

## 2013-05-01 NOTE — Telephone Encounter (Signed)
Pt called to let Dr. Ileene Rubens know that she will stay on the larger dose of Effexor. (226m)

## 2013-05-01 NOTE — Telephone Encounter (Signed)
Pt notified; pt states she has some clear drainage and some stuffiness. She also states her left ear feels like she may have some fluid in it. Pt states she doesn't feel like she has a sinus infection but she is going on a cruise and doesn't want to be uncomfortable then. i told her to continue the nasal steriod spray that she already has and she could add a decongestant to her regimen to help relieve some of the stuffiness. I told pt if she didn't feel any relief by thurs or Friday then call us back.Pt voiced understanding

## 2013-05-01 NOTE — Telephone Encounter (Signed)
Wonderful, new Rx has been sent to [facility] [place].

## 2013-05-30 ENCOUNTER — Encounter: Payer: Self-pay | Admitting: Family Medicine

## 2013-05-30 ENCOUNTER — Ambulatory Visit (INDEPENDENT_AMBULATORY_CARE_PROVIDER_SITE_OTHER): Payer: 59 | Admitting: Family Medicine

## 2013-05-30 ENCOUNTER — Ambulatory Visit (INDEPENDENT_AMBULATORY_CARE_PROVIDER_SITE_OTHER): Payer: 59

## 2013-05-30 VITALS — BP 154/96 | HR 114 | Wt 191.0 lb

## 2013-05-30 DIAGNOSIS — M545 Low back pain, unspecified: Secondary | ICD-10-CM

## 2013-05-30 MED ORDER — CYCLOBENZAPRINE HCL 10 MG PO TABS: ORAL_TABLET | ORAL | Status: DC

## 2013-05-30 MED ORDER — HYDROCODONE-ACETAMINOPHEN 10-325 MG PO TABS: 1.0000 | ORAL_TABLET | Freq: Three times a day (TID) | ORAL | Status: DC | PRN

## 2013-05-30 NOTE — Progress Notes (Signed)
CC: Tracey Patterson is a 33 y.o. female is here for Back Pain   Subjective: HPI:  Complaints of low back pain has been present since Saturday of this week. Symptoms are similar to back pains that she gets with every menstrual cycle only that this one is much more severe. Symptoms came on gradually Saturday have been getting worse a daily basis for a moderate to severe in severity it improves with lying down worse with bending forward or extending the spine. Interventions have included ibuprofen 800 mg 3 times a day for the last 24 hours without much benefit. Pain is described as tightness. Denies midline back pain, saddle paresthesia, bowel or bladder incontinence, GI disturbance, nor genitourinary complaints other than about to start her menses.   Review Of Systems Outlined In HPI  Past Medical History  Diagnosis Date  . Asthma   . History of TMJ disorder   . Anxiety     Past Surgical History  Procedure Laterality Date  . Knee arthroscopy  1998    left  . Tonsillectomy    . Laparoscopic tubal ligation  02/28/2012    Procedure: LAPAROSCOPIC TUBAL LIGATION;  Surgeon: Cyril Mourning, MD;  Location: [facility];  Service: Gynecology;  Laterality: Bilateral;   Family History  Problem Relation Age of Onset  . Hypertension Mother   . Hypertension Father     History   Social History  . Marital Status: Married    Spouse Name: N/A    Number of Children: N/A  . Years of Education: N/A   Occupational History  . Not on file.   Social History Main Topics  . Smoking status: Never Smoker   . Smokeless tobacco: Never Used  . Alcohol Use: Yes  . Drug Use: No  . Sexual Activity: Not on file   Other Topics Concern  . Not on file   Social History Narrative   Marital Status: Married Aeronautical engineer)    Children:  Son Air cabin crew) Daughter [phone])    Pets: Dogs (1) Cats (2)    Living Situation: Lives with  husband and children    Occupation: Radiology [employer]    Education: Bachelor's Degree in Science    Tobacco Use/Exposure:  None    Alcohol Use:  Occasional   Drug Use:  None   Diet:  Regular   Exercise:  None   Hobbies:  Reading            Objective: BP 154/96  Pulse 114  Wt 191 lb (86.637 kg)  General: Alert and Oriented, No Acute Distress HEENT: Pupils equal, round, reactive to light. Conjunctivae clear.  Moist mucous membranes pharynx unremarkable Lungs: Clear to auscultation bilaterally, no wheezing/ronchi/rales.  Comfortable work of breathing. Good air movement. Cardiac: Regular rate and rhythm. Normal S1/S2.  No murmurs, rubs, nor gallops.   Back: No CVA tenderness, no midline spinous process tenderness, pain is reproduced with low paralumbar musculature palpation. Pink slightly reproduced with extension no pain with flexion. Full range of motion of the lumbar spine. Extremities: No peripheral edema.  Strong peripheral pulses. Straight leg raise negative bilaterally, Faber negative bilaterally, long roll negative bilaterally, L4 and S1 DTRs two over four bilaterally, full range of motion strength in both lower extremities Mental Status: No depression, anxiety, nor agitation. Skin: Warm and dry.  Assessment & Plan: [name] was seen today for back pain.  Diagnoses and associated orders for this visit:  Low back pain - DG Lumbar Spine Complete;  Future - cyclobenzaprine (FLEXERIL) 10 MG tablet; Take a half to a full tab every 8-12 hours only as needed for muscle spasm or back pain, may cause sedation. - HYDROcodone-acetaminophen (NORCO) 10-325 MG per tablet; Take 1 tablet by mouth every 8 (eight) hours as needed.    Low back pain is felt to be from a muscular etiology, given the repetitive nature of her back pain will obtain films to look for spondylolisthesis and spondylosis. Start cyclobenzaprine and as needed Norco pending results from the plain films.  Return if symptoms worsen or fail to improve.

## 2013-08-31 ENCOUNTER — Telehealth: Payer: Self-pay | Admitting: Family Medicine

## 2013-08-31 DIAGNOSIS — F329 Major depressive disorder, single episode, unspecified: Secondary | ICD-10-CM

## 2013-08-31 DIAGNOSIS — F419 Anxiety disorder, unspecified: Principal | ICD-10-CM

## 2013-08-31 DIAGNOSIS — F32A Depression, unspecified: Secondary | ICD-10-CM

## 2013-08-31 MED ORDER — CLONAZEPAM 0.5 MG PO TABS: ORAL_TABLET | ORAL | Status: DC

## 2013-08-31 NOTE — Telephone Encounter (Signed)
Andrea, Rx placed in in-box ready for pickup/faxing.  

## 2013-08-31 NOTE — Telephone Encounter (Signed)
rx faxed

## 2013-09-05 ENCOUNTER — Ambulatory Visit (INDEPENDENT_AMBULATORY_CARE_PROVIDER_SITE_OTHER): Payer: 59 | Admitting: Family Medicine

## 2013-09-05 ENCOUNTER — Encounter: Payer: Self-pay | Admitting: Family Medicine

## 2013-09-05 VITALS — BP 133/84 | HR 104 | Wt 190.0 lb

## 2013-09-05 DIAGNOSIS — M62830 Muscle spasm of back: Secondary | ICD-10-CM

## 2013-09-05 DIAGNOSIS — M25819 Other specified joint disorders, unspecified shoulder: Secondary | ICD-10-CM

## 2013-09-05 DIAGNOSIS — M7542 Impingement syndrome of left shoulder: Secondary | ICD-10-CM

## 2013-09-05 DIAGNOSIS — M7580 Other shoulder lesions, unspecified shoulder: Secondary | ICD-10-CM

## 2013-09-05 DIAGNOSIS — IMO0002 Reserved for concepts with insufficient information to code with codable children: Secondary | ICD-10-CM

## 2013-09-05 DIAGNOSIS — M5416 Radiculopathy, lumbar region: Secondary | ICD-10-CM

## 2013-09-05 DIAGNOSIS — M5380 Other specified dorsopathies, site unspecified: Secondary | ICD-10-CM

## 2013-09-05 MED ORDER — PREDNISONE 20 MG PO TABS: ORAL_TABLET | ORAL | Status: AC

## 2013-09-05 NOTE — Progress Notes (Signed)
CC: Tracey Patterson is a 33 y.o. female is here for Back Pain   Subjective: HPI:  Complains of continued low back pain that has been present since March of this year. Described as tightness in the lower lumbar region on the right and left now with a radicular component of burning going down the back of the left buttock and thigh. Symptoms are improved with lying on a flat surface. Symptoms are worse with bending forward. Mild resolution of discomfort when taking Flexeril however this causes her to fall sleep. Nothing else has made it better or worse. She denies any other motor or sensory disturbances in the lower extremity. Denies midline back pain, trauma, bowel or bladder incontinence, nor overlying skin changes. Overall pain is mild to moderate in severity  States that this has also caused her left shoulder to begin hurting again. She has a history of an accident with suspicion of labral tear from a workers compensation case years ago that has left her with chronic pain and left shoulder which has slightly worsened since the above issues began. She describes it as a sharp pain in the anterior aspect of the shoulder worse with lifting her arms above her head. Slightly improved when arm is in a neutral position nothing else makes better or worse. She denies overlying skin changes, swelling, but does have chronic numbness down the lateral aspect of the arm extending into the fifth digit that has been present for years and unchanging in severity and character.   No chest pain, shortness of breath, GI disturbance, nor abdominal pain   Review Of Systems Outlined In HPI  Past Medical History  Diagnosis Date  . Asthma   . History of TMJ disorder   . Anxiety     Past Surgical History  Procedure Laterality Date  . Knee arthroscopy  1998    left  . Tonsillectomy    . Laparoscopic tubal ligation  02/28/2012    Procedure: LAPAROSCOPIC TUBAL LIGATION;  Surgeon: Cyril Mourning, MD;  Location: [facility];  Service: Gynecology;  Laterality: Bilateral;   Family History  Problem Relation Age of Onset  . Hypertension Mother   . Hypertension Father     History   Social History  . Marital Status: Married    Spouse Name: N/A    Number of Children: N/A  . Years of Education: N/A   Occupational History  . Not on file.   Social History Main Topics  . Smoking status: Never Smoker   . Smokeless tobacco: Never Used  . Alcohol Use: Yes  . Drug Use: No  . Sexual Activity: Not on file   Other Topics Concern  . Not on file   Social History Narrative   Marital Status: Married Aeronautical engineer)    Children:  Son Air cabin crew) Daughter [phone])    Pets: Dogs (1) Cats (2)    Living Situation: Lives with  husband and children    Occupation: Radiology [employer]    Education: Bachelor's Degree in Science    Tobacco Use/Exposure:  None    Alcohol Use:  Occasional   Drug Use:  None   Diet:  Regular   Exercise:  None   Hobbies:  Reading            Objective: BP 133/84  Pulse 104  Wt 190 lb (86.183 kg)  General: Alert and Oriented, No Acute Distress HEENT: Pupils equal, round, reactive to light. Conjunctivae clear.  Moist mucous membranes  pharynx unremarkable Lungs: Clear to auscultation bilaterally, no wheezing/ronchi/rales.  Comfortable work of breathing. Good air movement. Cardiac: Regular rate and rhythm. Normal S1/S2.  No murmurs, rubs, nor gallops.   Back: No restrictions with extension however flexion is limited to approximately 45 in the lumbar region. Paraspinal musculature in the thoracic and lumbar region are slightly tender and moderately hypertonic. No midline spinous process tenderness, left SI joint is tender to palpation. Stork test negative bilaterally Extremities: No peripheral edema.  Strong peripheral pulses. Full range of motion strength in both lower extremities with L4 and S1 DTRs two over four bilaterally. Left shoulder exam reveals full range of  motion and strength in all planes of motion and with individual rotator cuff testing. No overlying redness warmth or swelling.  Neer's test negative.  Hawkins test positive. Empty can positive. Crossarm test negative. O'Brien's test negative. Apprehension test negative. Speed's test negative. Mental Status: No depression, anxiety, nor agitation. Skin: Warm and dry.  Assessment & Plan: Tracey Patterson was seen today for back pain.  Diagnoses and associated orders for this visit:  Lumbar radicular pain - predniSONE (DELTASONE) 20 MG tablet; Three tabs daily days 1-3, two tabs daily days 4-6, one tab daily days 7-9, half tab daily days 10-13.  Muscle spasm of back  Impingement syndrome of left shoulder    Lumbar radicular pain due to spasm of the back: We'll try prednisone taper and if no improvement will it into formal physical therapy. She was given a handout on home rehabilitation exercises/stretches to be performed on a daily basis for the next 3 weeks. Impingement of the left shoulder: She may actually get some benefit from the prednisone she is taking for her back, if this does not improve we'll also refer for physical therapy   Return if symptoms worsen or fail to improve.

## 2013-09-11 ENCOUNTER — Encounter: Payer: Self-pay | Admitting: Family Medicine

## 2013-09-11 ENCOUNTER — Other Ambulatory Visit: Payer: Self-pay | Admitting: Family Medicine

## 2013-09-20 ENCOUNTER — Encounter: Payer: Self-pay | Admitting: Family Medicine

## 2013-09-20 MED ORDER — CETIRIZINE HCL 10 MG PO TABS: 10.0000 mg | ORAL_TABLET | Freq: Every day | ORAL | Status: DC

## 2013-10-15 ENCOUNTER — Encounter: Payer: Self-pay | Admitting: Family Medicine

## 2013-10-15 DIAGNOSIS — G4700 Insomnia, unspecified: Secondary | ICD-10-CM

## 2013-10-15 MED ORDER — ZOLPIDEM TARTRATE 10 MG PO TABS: ORAL_TABLET | ORAL | Status: DC

## 2013-10-15 NOTE — Telephone Encounter (Signed)
Tracey Patterson or Tracey Patterson, Will you please let patient know that Rx has been printed here (Andrea's inbox) and ready for pickup/fax.

## 2013-10-16 NOTE — Telephone Encounter (Signed)
I will assume rx was faxed yesterday by Roselyn Reef since it is not in the box

## 2013-11-22 ENCOUNTER — Telehealth: Payer: Self-pay | Admitting: Family Medicine

## 2013-11-22 DIAGNOSIS — F329 Major depressive disorder, single episode, unspecified: Secondary | ICD-10-CM

## 2013-11-22 DIAGNOSIS — F419 Anxiety disorder, unspecified: Principal | ICD-10-CM

## 2013-11-22 DIAGNOSIS — F32A Depression, unspecified: Secondary | ICD-10-CM

## 2013-11-22 MED ORDER — CLONAZEPAM 0.5 MG PO TABS: ORAL_TABLET | ORAL | Status: DC

## 2013-11-22 NOTE — Telephone Encounter (Signed)
Refill req 

## 2013-11-26 ENCOUNTER — Encounter: Payer: Self-pay | Admitting: Family Medicine

## 2013-11-30 ENCOUNTER — Ambulatory Visit (INDEPENDENT_AMBULATORY_CARE_PROVIDER_SITE_OTHER): Payer: 59 | Admitting: Sports Medicine

## 2013-11-30 ENCOUNTER — Encounter: Payer: Self-pay | Admitting: Sports Medicine

## 2013-11-30 ENCOUNTER — Ambulatory Visit (INDEPENDENT_AMBULATORY_CARE_PROVIDER_SITE_OTHER): Payer: 59

## 2013-11-30 VITALS — BP 129/88 | HR 87 | Ht 65.0 in | Wt 187.0 lb

## 2013-11-30 DIAGNOSIS — M5412 Radiculopathy, cervical region: Secondary | ICD-10-CM

## 2013-11-30 DIAGNOSIS — IMO0002 Reserved for concepts with insufficient information to code with codable children: Secondary | ICD-10-CM

## 2013-11-30 DIAGNOSIS — M542 Cervicalgia: Secondary | ICD-10-CM

## 2013-11-30 DIAGNOSIS — M5416 Radiculopathy, lumbar region: Secondary | ICD-10-CM | POA: Insufficient documentation

## 2013-11-30 MED ORDER — PREDNISONE 50 MG PO TABS: ORAL_TABLET | ORAL | Status: DC

## 2013-11-30 MED ORDER — DICLOFENAC SODIUM 75 MG PO TBEC: 75.0000 mg | DELAYED_RELEASE_TABLET | Freq: Two times a day (BID) | ORAL | Status: DC

## 2013-11-30 MED ORDER — METHOCARBAMOL 500 MG PO TABS: 500.0000 mg | ORAL_TABLET | Freq: Three times a day (TID) | ORAL | Status: DC

## 2013-11-30 NOTE — Assessment & Plan Note (Signed)
Left S1. Formal PT, prednisone, methocarbamol, diclofenac. Return in a month, MRI for interventional injection planning if no better.

## 2013-11-30 NOTE — Assessment & Plan Note (Signed)
Left C8, physical therapy, x-rays, prednisone, methocarbamol, diclofenac. Return in one month, MRI for interventional injection planning if no better.

## 2013-11-30 NOTE — Progress Notes (Signed)
Patient ID: Tracey Patterson, female   DOB: 05-Oct-1980, 33 y.o.   MRN: [id number]   Subjective:    I'm seeing this patient as a consultation for: Marcial Pacas, DO  CC: Lumbar back pain and left shoulder pain  HPI: Tracey Patterson is a very pleasant 33 year old female who presents with >6 months of left-sided lumbar back pain and worsening chronic left shoulder pain. She states that the pain in her back originates in the left lumbar spine and extends through the buttocks into the posterior leg and lateral foot. This pain is associated with tingling and numbness and is worse with sitting or flexion. X-ray of the lumbar spine done in March was negative. She also reports over 4 years of pain in her left shoulder, with acute worsening over the past 6 months. Volunteered that this was previously treated as impingement syndrome; however, does report neck stiffness and pain. Pain is worse with overhead activity. Numbness extends into the triceps, medial forearm, and 4th/5th fingers on the left side. No saddle paresthesias, loss of bowel or bladder control. Currently managing these issues with ibuprofen every day and methocarbamol at night, which provides significant relief.  Past medical history, Surgical history, Family history not pertinant except as noted below, Social history, Allergies, and medications have been entered into the medical record, reviewed, and no changes needed.   Review of Systems: No headache, visual changes, nausea, vomiting, diarrhea, constipation, dizziness, abdominal pain, skin rash, fevers, chills, night sweats, weight loss, swollen lymph nodes, body aches, joint swelling, muscle aches, chest pain, shortness of breath, mood changes, visual or auditory hallucinations.   Objective:   General: Well developed, well nourished, and in no acute distress.  Neuro/Psych: Alert and oriented x3, extra-ocular muscles intact, able to move all 4 extremities, sensation grossly intact. Skin: Warm and  dry, no rashes noted.  Respiratory: Not using accessory muscles, speaking in full sentences, trachea midline.  Cardiovascular: Pulses palpable, no extremity edema. Abdomen: Does not appear distended.  Neck: Inspection unremarkable. No palpable stepoffs. Slightly positive Spurling's maneuver. Full neck range of motion. Grip strength and sensation normal in bilateral hands. Strength good C4 to T1 distribution. No sensory change to C4 to T1. Negative Hoffman sign bilaterally. Reflexes normal.  Left Shoulder: Inspection reveals no abnormalities, atrophy or asymmetry. Palpation is normal with no tenderness over AC joint or bicipital groove. Passive ROM is full in all planes, active ROM limited by pain. Rotator cuff strength normal with exception of the subscapularis, which was slightly decreased. No signs of impingement with negative Neer and Hawkin's tests, empty can sign. Speeds and Yergason's tests normal. No labral pathology noted with negative Obrien's, negative clunk and good stability. Normal scapular function observed. No painful arc and no drop arm sign. No apprehension sign  Back Exam:  Inspection: Unremarkable  Motion: Flexion 45 deg, Extension 45 deg, Side Bending to 45 deg bilaterally,  Rotation to 45 deg bilaterally  SLR laying: Positive XSLR laying: Positive Some palpable tenderness over the spinous processes of the lumbar spine. FABER: negative. Sensory change: Gross sensation intact to all lumbar and sacral dermatomes.  Reflexes: 2+ at both patellar tendons, 2+ at achilles tendons, Babinski's downgoing.  Strength at foot  Plantar-flexion: 5/5 Dorsi-flexion: 5/5 Eversion: 5/5 Inversion: 5/5  Leg strength  Quad: 5/5 Hamstring: 5/5 Hip flexor: 5/5 Hip abductors: 5/5  Gait unremarkable.  Left Hip: ROM IR: 45 Deg, ER: 45 Deg, Flexion: 120 Deg, Extension: 100 Deg, Abduction: 45 Deg, Adduction: 45 Deg Strength IR: 5/5, ER:  5/5, Flexion: 5/5, Extension: 5/5,  Abduction: 5/5, Adduction: 5/5 Pelvic alignment unremarkable to inspection and palpation. Standing hip rotation and gait without trendelenburg sign / unsteadiness. Greater trochanter without tenderness to palpation. No tenderness over piriformis. No pain with FABER or FADIR. No SI joint tenderness and normal minimal SI movement.  Impression and Recommendations:   This case required medical decision making of moderate complexity. Left Lumbar Radiculitis: Disc herniation with nerve impingement at the S1 level is suggested in this patient with pain and numbess in the S1 nerve distribution that is worse with flexion and a positive SLR on exam. - Referral to formal PT - Prednisone - Methocarbamol - Diclofenac - Return in one month with plan to MRI for possible intervention if no improvement  Left Cervical Radiculitis: Disc herniation with nerve impingement at the C8 level is suggested in this patient with pain and numbness in the C8 nerve distribution that is worse with overhead activities, as well as slightly positive Spurling's maneuver. - Cervical spine x-ray - Referral to formal PT - Prednisone - Methocarbamol - Diclofenac - Return in one month with plan to MRI for possible intervention if no improvement

## 2013-12-13 ENCOUNTER — Encounter: Payer: Self-pay | Admitting: Sports Medicine

## 2013-12-21 ENCOUNTER — Ambulatory Visit (INDEPENDENT_AMBULATORY_CARE_PROVIDER_SITE_OTHER): Payer: 59 | Admitting: Physical Therapy

## 2013-12-21 DIAGNOSIS — M545 Low back pain, unspecified: Secondary | ICD-10-CM

## 2013-12-21 DIAGNOSIS — M5412 Radiculopathy, cervical region: Secondary | ICD-10-CM

## 2013-12-21 DIAGNOSIS — M2560 Stiffness of unspecified joint, not elsewhere classified: Secondary | ICD-10-CM

## 2013-12-21 DIAGNOSIS — M6281 Muscle weakness (generalized): Secondary | ICD-10-CM

## 2013-12-25 ENCOUNTER — Encounter: Payer: 59 | Admitting: Physical Therapy

## 2013-12-28 ENCOUNTER — Encounter (INDEPENDENT_AMBULATORY_CARE_PROVIDER_SITE_OTHER): Payer: 59

## 2013-12-28 ENCOUNTER — Ambulatory Visit: Payer: Self-pay | Admitting: Sports Medicine

## 2013-12-28 DIAGNOSIS — M6281 Muscle weakness (generalized): Secondary | ICD-10-CM

## 2013-12-28 DIAGNOSIS — M542 Cervicalgia: Secondary | ICD-10-CM

## 2013-12-28 DIAGNOSIS — M5412 Radiculopathy, cervical region: Secondary | ICD-10-CM

## 2013-12-28 DIAGNOSIS — M545 Low back pain: Secondary | ICD-10-CM

## 2013-12-28 DIAGNOSIS — M2550 Pain in unspecified joint: Secondary | ICD-10-CM

## 2014-01-01 ENCOUNTER — Encounter (INDEPENDENT_AMBULATORY_CARE_PROVIDER_SITE_OTHER): Payer: 59

## 2014-01-01 DIAGNOSIS — M5412 Radiculopathy, cervical region: Secondary | ICD-10-CM

## 2014-01-01 DIAGNOSIS — M2550 Pain in unspecified joint: Secondary | ICD-10-CM

## 2014-01-01 DIAGNOSIS — M545 Low back pain: Secondary | ICD-10-CM

## 2014-01-01 DIAGNOSIS — M6281 Muscle weakness (generalized): Secondary | ICD-10-CM

## 2014-01-03 ENCOUNTER — Encounter (INDEPENDENT_AMBULATORY_CARE_PROVIDER_SITE_OTHER): Payer: 59

## 2014-01-03 DIAGNOSIS — M2550 Pain in unspecified joint: Secondary | ICD-10-CM

## 2014-01-03 DIAGNOSIS — M545 Low back pain: Secondary | ICD-10-CM

## 2014-01-03 DIAGNOSIS — M6281 Muscle weakness (generalized): Secondary | ICD-10-CM

## 2014-01-03 DIAGNOSIS — M5412 Radiculopathy, cervical region: Secondary | ICD-10-CM

## 2014-01-03 DIAGNOSIS — M542 Cervicalgia: Secondary | ICD-10-CM

## 2014-01-08 ENCOUNTER — Encounter: Payer: Self-pay | Admitting: Physical Therapy

## 2014-01-09 ENCOUNTER — Encounter: Payer: Self-pay | Admitting: Sports Medicine

## 2014-01-09 DIAGNOSIS — M5136 Other intervertebral disc degeneration, lumbar region: Secondary | ICD-10-CM

## 2014-01-09 DIAGNOSIS — M51369 Other intervertebral disc degeneration, lumbar region without mention of lumbar back pain or lower extremity pain: Secondary | ICD-10-CM

## 2014-01-09 DIAGNOSIS — M5030 Other cervical disc degeneration, unspecified cervical region: Secondary | ICD-10-CM

## 2014-01-10 ENCOUNTER — Encounter (INDEPENDENT_AMBULATORY_CARE_PROVIDER_SITE_OTHER): Payer: 59 | Admitting: Physical Therapy

## 2014-01-10 ENCOUNTER — Telehealth: Payer: Self-pay | Admitting: *Deleted

## 2014-01-10 DIAGNOSIS — M6281 Muscle weakness (generalized): Secondary | ICD-10-CM

## 2014-01-10 DIAGNOSIS — M545 Low back pain: Secondary | ICD-10-CM

## 2014-01-10 DIAGNOSIS — M542 Cervicalgia: Secondary | ICD-10-CM

## 2014-01-10 DIAGNOSIS — M5412 Radiculopathy, cervical region: Secondary | ICD-10-CM

## 2014-01-10 NOTE — Telephone Encounter (Signed)
Left S1 radiculitis and left C8 radiculitis, ordering cervical and lumbar spine MRIs for interventional injection planning.

## 2014-01-10 NOTE — Telephone Encounter (Signed)
No prior auth required for MRI lumbar/cervical. Margette Fast, CMA

## 2014-01-12 ENCOUNTER — Ambulatory Visit (HOSPITAL_COMMUNITY)
Admission: RE | Admit: 2014-01-12 | Discharge: 2014-01-12 | Disposition: A | Discharge status: Home / Self Care | Payer: 59 | Source: Ambulatory Visit | Attending: Sports Medicine | Admitting: Sports Medicine

## 2014-01-12 DIAGNOSIS — M5030 Other cervical disc degeneration, unspecified cervical region: Secondary | ICD-10-CM | POA: Diagnosis not present

## 2014-01-12 DIAGNOSIS — M5136 Other intervertebral disc degeneration, lumbar region: Secondary | ICD-10-CM | POA: Insufficient documentation

## 2014-01-22 ENCOUNTER — Encounter: Payer: Self-pay | Admitting: Physical Therapy

## 2014-01-24 ENCOUNTER — Encounter: Payer: Self-pay | Admitting: Sports Medicine

## 2014-01-24 ENCOUNTER — Ambulatory Visit (INDEPENDENT_AMBULATORY_CARE_PROVIDER_SITE_OTHER): Payer: 59 | Admitting: Sports Medicine

## 2014-01-24 ENCOUNTER — Encounter: Payer: Self-pay | Admitting: Physical Therapy

## 2014-01-24 VITALS — BP 126/87 | HR 102 | Ht 65.5 in | Wt 190.0 lb

## 2014-01-24 DIAGNOSIS — M5416 Radiculopathy, lumbar region: Secondary | ICD-10-CM

## 2014-01-24 DIAGNOSIS — M5412 Radiculopathy, cervical region: Secondary | ICD-10-CM

## 2014-01-24 MED ORDER — TRAMADOL HCL 50 MG PO TABS: ORAL_TABLET | ORAL | Status: DC

## 2014-01-24 NOTE — Progress Notes (Signed)
  Subjective:    CC: Follow-up  HPI: Cervical radiculitis: Resolved with conservative measures.  Left lumbar radiculitis: Persistent. Radiates down the left leg.  Past medical history, Surgical history, Family history not pertinant except as noted below, Social history, Allergies, and medications have been entered into the medical record, reviewed, and no changes needed.   Review of Systems: No fevers, chills, night sweats, weight loss, chest pain, or shortness of breath.   Objective:    General: Well Developed, well nourished, and in no acute distress.  Neuro: Alert and oriented x3, extra-ocular muscles intact, sensation grossly intact.  HEENT: Normocephalic, atraumatic, pupils equal round reactive to light, neck supple, no masses, no lymphadenopathy, thyroid nonpalpable.  Skin: Warm and dry, no rashes. Cardiac: Regular rate and rhythm, no murmurs rubs or gallops, no lower extremity edema.  Respiratory: Clear to auscultation bilaterally. Not using accessory muscles, speaking in full sentences.  MRI was reviewed, cervical spine MRI is unremarkable, lumbar spine MRI shows a large paracentral left-sided L4-L5 disc protrusion affecting several nerve roots.  Impression and Recommendations:

## 2014-01-24 NOTE — Assessment & Plan Note (Signed)
Large L4-L5 disc protrusion. Persistent symptoms with a left sided radiculitis. At this point we are going to proceed with a left-sided L4-L5 transforaminal epidural. Return 2 weeks after the injection to go over results. Tramadol for pain.

## 2014-01-24 NOTE — Assessment & Plan Note (Signed)
Resolved with conservative measures and physical therapy. Cervical spine MRI is normal.

## 2014-01-31 ENCOUNTER — Ambulatory Visit
Admission: RE | Admit: 2014-01-31 | Discharge: 2014-01-31 | Disposition: A | Discharge status: Home / Self Care | Payer: 59 | Source: Ambulatory Visit | Attending: Sports Medicine | Admitting: Sports Medicine

## 2014-01-31 MED ORDER — METHYLPREDNISOLONE ACETATE 40 MG/ML INJ SUSP (RADIOLOG
120.0000 mg | Freq: Once | INTRAMUSCULAR | Status: AC
  Administered 2014-01-31: 120 mg via EPIDURAL

## 2014-01-31 MED ORDER — IOHEXOL 180 MG/ML  SOLN
1.0000 mL | Freq: Once | INTRAMUSCULAR | Status: AC | PRN
  Administered 2014-01-31: 1 mL via EPIDURAL

## 2014-01-31 NOTE — Discharge Instructions (Signed)

## 2014-02-06 ENCOUNTER — Other Ambulatory Visit: Payer: Self-pay | Admitting: Family Medicine

## 2014-02-14 ENCOUNTER — Ambulatory Visit (INDEPENDENT_AMBULATORY_CARE_PROVIDER_SITE_OTHER): Payer: 59 | Admitting: Sports Medicine

## 2014-02-14 ENCOUNTER — Ambulatory Visit (INDEPENDENT_AMBULATORY_CARE_PROVIDER_SITE_OTHER): Payer: 59 | Admitting: Family Medicine

## 2014-02-14 ENCOUNTER — Encounter: Payer: Self-pay | Admitting: Family Medicine

## 2014-02-14 ENCOUNTER — Encounter: Payer: Self-pay | Admitting: Sports Medicine

## 2014-02-14 VITALS — BP 128/87 | HR 91 | Wt 188.0 lb

## 2014-02-14 DIAGNOSIS — A499 Bacterial infection, unspecified: Secondary | ICD-10-CM

## 2014-02-14 DIAGNOSIS — F419 Anxiety disorder, unspecified: Principal | ICD-10-CM

## 2014-02-14 DIAGNOSIS — F32A Depression, unspecified: Secondary | ICD-10-CM

## 2014-02-14 DIAGNOSIS — F329 Major depressive disorder, single episode, unspecified: Secondary | ICD-10-CM

## 2014-02-14 DIAGNOSIS — J302 Other seasonal allergic rhinitis: Secondary | ICD-10-CM

## 2014-02-14 DIAGNOSIS — B9689 Other specified bacterial agents as the cause of diseases classified elsewhere: Secondary | ICD-10-CM

## 2014-02-14 DIAGNOSIS — F418 Other specified anxiety disorders: Secondary | ICD-10-CM

## 2014-02-14 DIAGNOSIS — J329 Chronic sinusitis, unspecified: Secondary | ICD-10-CM

## 2014-02-14 DIAGNOSIS — M5416 Radiculopathy, lumbar region: Secondary | ICD-10-CM

## 2014-02-14 MED ORDER — VENLAFAXINE HCL ER 75 MG PO CP24: ORAL_CAPSULE | ORAL | Status: DC

## 2014-02-14 MED ORDER — DOXYCYCLINE HYCLATE 100 MG PO TABS: ORAL_TABLET | ORAL | Status: AC

## 2014-02-14 MED ORDER — MONTELUKAST SODIUM 10 MG PO TABS: 10.0000 mg | ORAL_TABLET | Freq: Every day | ORAL | Status: DC

## 2014-02-14 NOTE — Progress Notes (Signed)
  Subjective:    CC: Follow-up after her epidural  HPI: This pleasant 33 year old female returns, she has a large midline L4-L5 disc extrusion, she had predominantly left L5 radicular symptoms, subsequently she had an L5-S1 transforaminal epidural. She had a temporary response, with slight improvement but now with persistent pain. Again the disc protrusion was L4-L5. Denies any fevers, chills, no bowel or bladder dysfunction or saddle numbness.  Past medical history, Surgical history, Family history not pertinant except as noted below, Social history, Allergies, and medications have been entered into the medical record, reviewed, and no changes needed.   Review of Systems: No fevers, chills, night sweats, weight loss, chest pain, or shortness of breath.   Objective:    General: Well Developed, well nourished, and in no acute distress.  Neuro: Alert and oriented x3, extra-ocular muscles intact, sensation grossly intact.  HEENT: Normocephalic, atraumatic, pupils equal round reactive to light, neck supple, no masses, no lymphadenopathy, thyroid nonpalpable.  Skin: Warm and dry, no rashes. Cardiac: Regular rate and rhythm, no murmurs rubs or gallops, no lower extremity edema.  Respiratory: Clear to auscultation bilaterally. Not using accessory muscles, speaking in full sentences.  Impression and Recommendations:

## 2014-02-14 NOTE — Progress Notes (Signed)
CC: Tracey Patterson is a 33 y.o. female is here for f/u Effexor   Subjective: HPI:   follow-up anxiety and depression: She is requesting refills on Effexor. She is taking 225 mg on a daily basis. She states this does a really good job with decreasing her irritability, subjective depression and  Loss of interest in hobbies most days of the month however during the days leading up to her. Symptoms seemed to return only with respect to anxiety and irritability. When they do return they're severe in severity. They go away after menstrual cycle. She denies any other mental disturbance or any history of thoughts of one to harm herself or others   Patient complains of facial pressure localized in the forehead with thick nasal discharge and postnasal drip.  Nonproductive cough and sore throat. Symptoms have been present for 1 week worsening on a daily basis. There are proceeded by her nose itching and upper respiratory allergy symptoms that began a few days after running out of Singulair. She denies fevers, chills nor motor or sensory disturbances   Review Of Systems Outlined In HPI  Past Medical History  Diagnosis Date  . Asthma   . History of TMJ disorder   . Anxiety     Past Surgical History  Procedure Laterality Date  . Knee arthroscopy  1998    left  . Tonsillectomy    . Laparoscopic tubal ligation  02/28/2012    Procedure: LAPAROSCOPIC TUBAL LIGATION;  Surgeon: Cyril Mourning, MD;  Location: [facility];  Service: Gynecology;  Laterality: Bilateral;   Family History  Problem Relation Age of Onset  . Hypertension Mother   . Hypertension Father     History   Social History  . Marital Status: Married    Spouse Name: N/A    Number of Children: N/A  . Years of Education: N/A   Occupational History  . Not on file.   Social History Main Topics  . Smoking status: Never Smoker   . Smokeless tobacco: Never Used  . Alcohol Use: Yes  . Drug Use: No  . Sexual Activity: Not on file    Other Topics Concern  . Not on file   Social History Narrative   Marital Status: Married Aeronautical engineer)    Children:  Son Air cabin crew) Daughter [phone])    Pets: Dogs (1) Cats (2)    Living Situation: Lives with  husband and children    Occupation: Radiology [employer]    Education: Bachelor's Degree in Science    Tobacco Use/Exposure:  None    Alcohol Use:  Occasional   Drug Use:  None   Diet:  Regular   Exercise:  None   Hobbies:  Reading            Objective: BP 128/87 mmHg  Pulse 91  Wt 188 lb (85.276 kg)  LMP 01/14/2014 (Approximate)  General: Alert and Oriented, No Acute Distress HEENT: Pupils equal, round, reactive to light. Conjunctivae clear.  External ears unremarkable, canals clear with intact TMs with appropriate landmarks.  Middle ear appears open without effusion. Pink inferior turbinates.  Moist mucous membranes, pharynx without inflammation nor lesions  However moderate mucoid discharge.  Neck supple without palpable lymphadenopathy nor abnormal masses. Lungs: Clear to auscultation bilaterally, no wheezing/ronchi/rales.  Comfortable work of breathing. Good air movement. Extremities: No peripheral edema.  Strong peripheral pulses.  Mental Status: No depression, anxiety, nor agitation. Skin: Warm and dry.  Assessment & Plan: Tracey Patterson was seen  today for f/u effexor.  Diagnoses and associated orders for this visit:  Anxiety and depression - Vit D  25 hydroxy (rtn osteoporosis monitoring) - venlafaxine XR (EFFEXOR-XR) 75 MG 24 hr capsule; TAKE 2 TO 3 CAPSULES BY MOUTH EVERY MORNING DEPENDING ON SIDE EFFECTS.  Seasonal allergies - montelukast (SINGULAIR) 10 MG tablet; Take 1 tablet (10 mg total) by mouth at bedtime.  Bacterial sinusitis - doxycycline (VIBRA-TABS) 100 MG tablet; One by mouth twice a day for ten days.     anxiety and depression: Overall controlled however  I want to make sure that vitamin D deficiency is not contributing to  her  Uncontrolled episodes around the time of her menses. Continue Effexor pending vitamin D level  Bacterial sinusitis: Start doxycycline consider nasal saline washes, restart Singulair  Return if symptoms worsen or fail to improve.

## 2014-02-14 NOTE — Assessment & Plan Note (Signed)
Moderate to large sized L4-L5 disc extrusion. Temporary response to left-sided L5-S1 transforaminal injection. Considering the size of her disc extrusion I'm going to refer her to Dr. Vertell Limber with neurosurgery per patient request. We are also going to obtain another epidural, L4-L5 interlaminar this time. Return to see me 2 weeks after the epidural.

## 2014-02-15 ENCOUNTER — Telehealth: Payer: Self-pay | Admitting: Family Medicine

## 2014-02-15 ENCOUNTER — Encounter: Payer: Self-pay | Admitting: Family Medicine

## 2014-02-15 DIAGNOSIS — E559 Vitamin D deficiency, unspecified: Secondary | ICD-10-CM | POA: Insufficient documentation

## 2014-02-15 LAB — VITAMIN D 25 HYDROXY (VIT D DEFICIENCY, FRACTURES): Vit D, 25-Hydroxy: 18 ng/mL — ABNORMAL LOW (ref 30–100)

## 2014-02-15 MED ORDER — VITAMIN D (ERGOCALCIFEROL) 1.25 MG (50000 UNIT) PO CAPS: 50000.0000 [IU] | ORAL_CAPSULE | ORAL | Status: DC

## 2014-02-15 NOTE — Telephone Encounter (Signed)
Seth Bake, Will you please let patient know that her Vitamin D level was quite low.  [facility] [place].  We'll want to recheck this in three months.

## 2014-02-15 NOTE — Telephone Encounter (Signed)
Pt notified via vm

## 2014-02-20 ENCOUNTER — Ambulatory Visit
Admission: RE | Admit: 2014-02-20 | Discharge: 2014-02-20 | Disposition: A | Discharge status: Home / Self Care | Payer: 59 | Source: Ambulatory Visit | Attending: Sports Medicine | Admitting: Sports Medicine

## 2014-02-20 MED ORDER — METHYLPREDNISOLONE ACETATE 40 MG/ML INJ SUSP (RADIOLOG
120.0000 mg | Freq: Once | INTRAMUSCULAR | Status: AC
  Administered 2014-02-20: 120 mg via EPIDURAL

## 2014-02-20 MED ORDER — IOHEXOL 180 MG/ML  SOLN
1.0000 mL | Freq: Once | INTRAMUSCULAR | Status: AC | PRN
  Administered 2014-02-20: 1 mL via EPIDURAL

## 2014-02-20 NOTE — Discharge Instructions (Signed)

## 2014-02-26 HISTORY — PX: BACK SURGERY: SHX140

## 2014-02-28 ENCOUNTER — Encounter: Payer: Self-pay | Admitting: Family Medicine

## 2014-02-28 DIAGNOSIS — F329 Major depressive disorder, single episode, unspecified: Secondary | ICD-10-CM

## 2014-02-28 DIAGNOSIS — F32A Depression, unspecified: Secondary | ICD-10-CM

## 2014-03-04 ENCOUNTER — Encounter: Payer: Self-pay | Admitting: Sports Medicine

## 2014-03-05 MED ORDER — ARIPIPRAZOLE 2 MG PO TABS: 2.0000 mg | ORAL_TABLET | Freq: Every day | ORAL | Status: DC

## 2014-03-06 ENCOUNTER — Ambulatory Visit: Payer: Self-pay | Admitting: Sports Medicine

## 2014-04-02 ENCOUNTER — Encounter: Payer: Self-pay | Admitting: Family Medicine

## 2014-04-02 ENCOUNTER — Ambulatory Visit (INDEPENDENT_AMBULATORY_CARE_PROVIDER_SITE_OTHER): Payer: 59 | Admitting: Family Medicine

## 2014-04-02 VITALS — BP 134/95 | HR 112 | Wt 187.0 lb

## 2014-04-02 DIAGNOSIS — F419 Anxiety disorder, unspecified: Principal | ICD-10-CM

## 2014-04-02 DIAGNOSIS — F329 Major depressive disorder, single episode, unspecified: Secondary | ICD-10-CM

## 2014-04-02 DIAGNOSIS — F418 Other specified anxiety disorders: Secondary | ICD-10-CM

## 2014-04-02 DIAGNOSIS — G4700 Insomnia, unspecified: Secondary | ICD-10-CM

## 2014-04-02 DIAGNOSIS — F32A Depression, unspecified: Secondary | ICD-10-CM

## 2014-04-02 MED ORDER — ZOLPIDEM TARTRATE 10 MG PO TABS: ORAL_TABLET | ORAL | Status: DC

## 2014-04-02 MED ORDER — VENLAFAXINE HCL ER 150 MG PO CP24: ORAL_CAPSULE | ORAL | Status: DC

## 2014-04-02 MED ORDER — CLONAZEPAM 0.5 MG PO TABS: ORAL_TABLET | ORAL | Status: DC

## 2014-04-02 MED ORDER — ARIPIPRAZOLE 10 MG PO TABS: 10.0000 mg | ORAL_TABLET | Freq: Every day | ORAL | Status: DC

## 2014-04-02 NOTE — Progress Notes (Signed)
CC: Tracey Patterson is a 34 y.o. female is here for f/u anxiety/depression   Subjective: HPI:  Follow-up anxiety and depression: Since I saw her last she's been taking 3 150 mg capsules of Effexor XR on a daily basis. Despite taking this high dose she still reports irritability, subjective depression, restlessness and pessimism on a daily basis. No longer seems to only follow her menstrual cycles. This has been accompanied by subjective anxiety all of which is to a moderate to severe severity. Symptoms have been heavily influenced by her son separation anxiety issues that has resulted in her stain in her unhappy marriage with her husband who just told her in December that he is not the father of a newborn with another woman. Symptoms are also worsened with her recent back surgery that has kept her out of work. I have prescribed her Abilify after she emailed me that she felt like she was going downhill and she denies any known side effects but is uncertain whetherbeen helpful or not. She continues to take Klonopin 1-2 times a day to help with irritability and anxiety. Both of these issues are causing her to have difficulty with both falling asleep and staying asleep. She denies any other mental disturbance nor thoughts of wanting to harm herself or others.  Review Of Systems Outlined In HPI  Past Medical History  Diagnosis Date  . Asthma   . History of TMJ disorder   . Anxiety     Past Surgical History  Procedure Laterality Date  . Knee arthroscopy  1998    left  . Tonsillectomy    . Laparoscopic tubal ligation  02/28/2012    Procedure: LAPAROSCOPIC TUBAL LIGATION;  Surgeon: Cyril Mourning, MD;  Location: [facility];  Service: Gynecology;  Laterality: Bilateral;   Family History  Problem Relation Age of Onset  . Hypertension Mother   . Hypertension Father     History   Social History  . Marital Status: Married    Spouse Name: N/A    Number of Children: N/A  . Years of Education: N/A    Occupational History  . Not on file.   Social History Main Topics  . Smoking status: Never Smoker   . Smokeless tobacco: Never Used  . Alcohol Use: Yes  . Drug Use: No  . Sexual Activity: Not on file   Other Topics Concern  . Not on file   Social History Narrative   Marital Status: Married Aeronautical engineer)    Children:  Son Air cabin crew) Daughter [name])    Pets: Dogs (1) Cats (2)    Living Situation: Lives with  husband and children    Occupation: Radiology [employer]    Education: Bachelor's Degree in Science    Tobacco Use/Exposure:  None    Alcohol Use:  Occasional   Drug Use:  None   Diet:  Regular   Exercise:  None   Hobbies:  Reading            Objective: BP 134/95 mmHg  Pulse 112  Wt 187 lb (84.823 kg)  Vital signs reviewed. General: Alert and Oriented, No Acute Distress HEENT: Pupils equal, round, reactive to light. Conjunctivae clear.  External ears unremarkable.  Moist mucous membranes. Lungs: Clear and comfortable work of breathing, speaking in full sentences without accessory muscle use. Cardiac: Regular rate and rhythm.  Neuro: CN II-XII grossly intact, gait normal. Extremities: No peripheral edema.  Strong peripheral pulses.  Mental Status: mild depression. No  anxiety nor agitation. Logical though process. Skin: Warm and dry.  Assessment & Plan: [name] was seen today for f/u anxiety/depression.  Diagnoses and associated orders for this visit:  Anxiety and depression - venlafaxine XR (EFFEXOR-XR) 150 MG 24 hr capsule; TAKE 2 TO 3 CAPSULES BY MOUTH EVERY MORNING DEPENDING ON SIDE EFFECTS. - clonazePAM (KLONOPIN) 0.5 MG tablet; Half to full tab daily as needed for anxiety. - Ambulatory referral to Psychology  Depression - ARIPiprazole (ABILIFY) 10 MG tablet; Take 1 tablet (10 mg total) by mouth daily.  Insomnia - zolpidem (AMBIEN) 10 MG tablet; Take 1/2 - 1 tab at night for up to 8 hours of sleep    Anxiety anddepression:  Uncontrolledchronic conditions, continue current Effexor dose, increasing Abilify now 10 mg daily. Referral to psychology so that she can establish with a counselor. Continue as needed clonazepam. Insomnia: Refills provided for Ambien however I've asked her to hold off on taking this until she's been on Abilify 10 mg for a week and hopes that she will have any insomnia issues of her anxiety and depression is better controlled with Abilify increased.  25 minutes spent face-to-face during visit today of which at least 50% was counseling or coordinating care regarding: 1. Anxiety and depression   2. Depression   3. Insomnia      Return in about 3 months (around 07/02/2014), or if symptoms worsen or fail to improve.

## 2014-04-11 ENCOUNTER — Telehealth: Payer: Self-pay | Admitting: Family Medicine

## 2014-04-11 ENCOUNTER — Encounter: Payer: Self-pay | Admitting: Family Medicine

## 2014-04-11 NOTE — Telephone Encounter (Signed)
I called and apologized to the patient about my mix up. She was very understanding. I called [facility] and they have not scheduled her yet. Vita will call patient today to schedule her an appointment.

## 2014-04-11 NOTE — Telephone Encounter (Addendum)
Dr Ileene Rubens,   The message was my mistake. I was trying to schedule another patient's mammogram and went to document in the My-chart message and picked the wrong e-mail. I am so sorry. Thank you for letting me know. I will need to contact the correct patient.

## 2014-04-11 NOTE — Telephone Encounter (Signed)
Anderson Malta, Can you please see if behavorial health can contact this patient about a referral to see a counselor, she's not heard anything from them yet.  Referral order placed back in your folder.  Levada Dy, It does not look like anyone ordered a mammogram on this patient (see below), can you make sure that the appt is not for this patient?    ----- Message -----     From: Milana Kidney     Sent: 04/11/2014 10:40 AM      To: [facility] Clinical Pool    Subject: RE: Visit Follow-Up Question                 I Believe there has been a mixup. I need an initial appointment with behavioral health per Dr. Lajoyce Lauber order. I do not need an appointment with the breast Center        Thanks        ----- Message -----    From: CMA Darliss Ridgel    Sent: 04/11/14, 10:30 AM    To: Milana Kidney    Subject: RE: Visit Follow-Up Question        [facility],    Your diagnostic Mammogram has been scheduled with [facility] Address: [address] [place], [place] [address] [phone] on January 22 nd at 3 pm. If there is a scheduling problem call the [facility] Center to reschedule. Call us if you have any concerns or questions.         Shaune Pascal, RMA        ----- Message -----     From: Milana Kidney     Sent: 04/11/2014 8:32 AM EST      To: Marcial Pacas, DO    Subject: Visit Follow-Up Question        I still have not heard from behavioral health for an appointment.        Thanks

## 2014-04-16 NOTE — Telephone Encounter (Signed)
Called downstairs to [employer] and spoke with Maudie Mercury and she confirmed that she spoke with patient and scheduled her for 05-23-14 at 11:00

## 2014-04-23 ENCOUNTER — Other Ambulatory Visit (HOSPITAL_COMMUNITY): Payer: Self-pay | Admitting: Neurosurgery

## 2014-04-23 DIAGNOSIS — M5416 Radiculopathy, lumbar region: Secondary | ICD-10-CM

## 2014-04-24 ENCOUNTER — Ambulatory Visit (HOSPITAL_COMMUNITY)
Admission: RE | Admit: 2014-04-24 | Discharge: 2014-04-24 | Disposition: A | Discharge status: Home / Self Care | Payer: 59 | Source: Ambulatory Visit | Attending: Neurosurgery | Admitting: Neurosurgery

## 2014-04-24 DIAGNOSIS — M5416 Radiculopathy, lumbar region: Secondary | ICD-10-CM | POA: Diagnosis present

## 2014-04-24 MED ORDER — GADOBENATE DIMEGLUMINE 529 MG/ML IV SOLN
20.0000 mL | Freq: Once | INTRAVENOUS | Status: AC | PRN
  Administered 2014-04-24: 17 mL via INTRAVENOUS

## 2014-05-02 ENCOUNTER — Ambulatory Visit (INDEPENDENT_AMBULATORY_CARE_PROVIDER_SITE_OTHER): Payer: 59 | Admitting: Family Medicine

## 2014-05-02 ENCOUNTER — Encounter: Payer: Self-pay | Admitting: Family Medicine

## 2014-05-02 VITALS — BP 128/88 | HR 93 | Temp 97.7°F | Wt 197.0 lb

## 2014-05-02 DIAGNOSIS — J329 Chronic sinusitis, unspecified: Secondary | ICD-10-CM

## 2014-05-02 DIAGNOSIS — B9689 Other specified bacterial agents as the cause of diseases classified elsewhere: Secondary | ICD-10-CM

## 2014-05-02 DIAGNOSIS — A499 Bacterial infection, unspecified: Secondary | ICD-10-CM

## 2014-05-02 DIAGNOSIS — E559 Vitamin D deficiency, unspecified: Secondary | ICD-10-CM

## 2014-05-02 MED ORDER — ALBUTEROL SULFATE HFA 108 (90 BASE) MCG/ACT IN AERS: 2.0000 | INHALATION_SPRAY | RESPIRATORY_TRACT | Status: DC | PRN

## 2014-05-02 MED ORDER — DOXYCYCLINE HYCLATE 100 MG PO TABS: ORAL_TABLET | ORAL | Status: AC

## 2014-05-02 NOTE — Progress Notes (Signed)
CC: Tracey Patterson is a 34 y.o. female is here for Sinusitis   Subjective: HPI:  Facial pressure localized in the forehead with thick nasal congestion that has been present for the past 2 weeks worsening on a weekly basis. Now moderate to mild in severity. Worse first thing in the morning. No benefit from over-the-counter cough and cold medication or ibuprofen. Symptoms are worse leaning forward. Denies shortness of breath, cough, wheezing, chest pain, sore throat or fevers or chills.   Review Of Systems Outlined In HPI  Past Medical History  Diagnosis Date  . Asthma   . History of TMJ disorder   . Anxiety     Past Surgical History  Procedure Laterality Date  . Knee arthroscopy  1998    left  . Tonsillectomy    . Laparoscopic tubal ligation  02/28/2012    Procedure: LAPAROSCOPIC TUBAL LIGATION;  Surgeon: Cyril Mourning, MD;  Location: [facility];  Service: Gynecology;  Laterality: Bilateral;   Family History  Problem Relation Age of Onset  . Hypertension Mother   . Hypertension Father     History   Social History  . Marital Status: Married    Spouse Name: N/A    Number of Children: N/A  . Years of Education: N/A   Occupational History  . Not on file.   Social History Main Topics  . Smoking status: Never Smoker   . Smokeless tobacco: Never Used  . Alcohol Use: Yes  . Drug Use: No  . Sexual Activity: Not on file   Other Topics Concern  . 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 Conjunctivae clear.  External ears unremarkable, canals clear with intact TMs with appropriate landmarks.  Middle ear appears open without effusion. Boggy erythematous inferior turbinates with mild mucoid discharge.  Moist mucous membranes, pharynx without inflammation nor lesions.  Neck supple without palpable lymphadenopathy nor abnormal masses. Lungs: Clear to auscultation bilaterally, no wheezing/ronchi/rales.  Comfortable work of breathing. Good air movement. Extremities: No peripheral edema.  Strong peripheral pulses.  Mental Status: No depression, anxiety, nor agitation. Skin: Warm and dry.  Assessment & Plan: [name] was seen today for sinusitis.  Diagnoses and associated orders for this visit:  Bacterial sinusitis - albuterol (PROVENTIL HFA;VENTOLIN HFA) 108 (90 BASE) MCG/ACT inhaler; Inhale 2 puffs into the lungs every 4 (four) hours as needed for wheezing (cough, shortness of breath or wheezing.). - doxycycline (VIBRA-TABS) 100 MG tablet; One by mouth twice a day for ten days.  Vitamin D deficiency - Vit D  25 hydroxy (rtn osteoporosis monitoring)    Bacterial sinusitis: Start doxycycline consider nasal saline washes. Albuterol was provided however it has nothing to do with her bacterial sinusitis, this  was requested for her asthma and she's run out of her rescue inhaler. Vitamin D deficiency:at the end of the month she is due for a vitamin D recheck I provided her with a lab slip that she can submit at her convenience  Return if symptoms worsen or fail to improve.

## 2014-05-23 ENCOUNTER — Encounter (HOSPITAL_COMMUNITY): Payer: Self-pay | Admitting: Psychiatry

## 2014-05-23 ENCOUNTER — Ambulatory Visit (INDEPENDENT_AMBULATORY_CARE_PROVIDER_SITE_OTHER): Payer: 59 | Admitting: Psychiatry

## 2014-05-23 ENCOUNTER — Ambulatory Visit: Payer: Self-pay | Admitting: Family Medicine

## 2014-05-23 ENCOUNTER — Ambulatory Visit (HOSPITAL_COMMUNITY): Payer: Self-pay | Admitting: Licensed Clinical Social Worker

## 2014-05-23 VITALS — BP 126/78 | HR 100 | Ht 65.0 in | Wt 199.0 lb

## 2014-05-23 DIAGNOSIS — F411 Generalized anxiety disorder: Secondary | ICD-10-CM

## 2014-05-23 DIAGNOSIS — F4323 Adjustment disorder with mixed anxiety and depressed mood: Secondary | ICD-10-CM

## 2014-05-23 DIAGNOSIS — F331 Major depressive disorder, recurrent, moderate: Secondary | ICD-10-CM

## 2014-05-23 NOTE — Progress Notes (Signed)
Patient ID: Tracey Patterson, female   DOB: March 12, 1981, 34 y.o.   MRN: [id number]  [facility]   JING HOWATT [id number] 34 y.o.  05-31-14 11:26 AM  Chief Complaint:  deprssion  History of Present Illness:   Patient Presents for Initial Evaluation with symptoms of depression and anxiety. 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 She has 2 kids ages 75 and 85.  She presents with symptoms of depression including withdrawn anhedonia and decreased interest feeling of helplessness disturbed sleep and energy. She also endorses excessive worries worries are more so reasonable and related to her current concern with her husband who has cheated on her 2 years ago and then again cheated and now he has a baby with another woman who is 27 months of age.  Aggravating factors; husbands cheated, job stress, finances.  Modifying factors; her kids she still likes her job. She does have support of her sister and mom.  Patient has been seeing Dr. Barbaraann Barthel and has been Effexor which has gradually increased to 225 mg Abilify 10 mg medication has helped the anxiety  And PMS, but not so much on the depression. Her depression is more so related with her current marital situation which has been a significant stress. It is quite possible that husband may be leaving and she is going to go through an adjustment which she is worried about and financial times and also related with the kids.  She has gone through the same when she was young age 35 and her parents got divorced. She also has had her sister who had an accident and died 05/31/05 that led her to have difficulty and excessive worries related to her husband when he would be late she would call them to make sure that he is safe. Patient does not endorse psychotic symptoms as well as hallucinations. Patient does not endorse  paranoia or manic symptoms  Patient has been on Lexapro Cymbalta Zoloft with no significant help. She does believe Effexor has helped but still going through symptoms of depression concerned because of her current relationship issues.  Severity of depression and a scale of 1-10. 4 out of 10. 10 being no depression      Past Psychiatric History/Hospitalization(s) Denies./ She has been treated for Post partum depression when her son was born.   Hospitalization for psychiatric illness: No History of Electroconvulsive Shock Therapy: No Prior Suicide Attempts: No  Medical History; Past Medical History  Diagnosis Date  . Asthma   . History of TMJ disorder   . Anxiety     Allergies: Allergies  Allergen Reactions  . Crab [Shellfish Allergy] Other (See Comments)    Allergist advises against eating crab.  . Sulfa Antibiotics Hives  . Zoloft [Sertraline Hcl]     Hair loss  . Other Rash    Dermabond, steri strips    Medications: Outpatient Encounter Prescriptions as of 31-May-2014  Medication Sig  . albuterol (PROVENTIL HFA;VENTOLIN HFA) 108 (90 BASE) MCG/ACT inhaler Inhale 2 puffs into the lungs every 4 (four) hours as needed for wheezing (cough, shortness of breath or wheezing.).  Marland Kitchen ARIPiprazole (ABILIFY) 10 MG tablet Take 1 tablet (10 mg total) by mouth daily.  . Azelastine-Fluticasone 137-50 MCG/ACT SUSP Place 1 spray into the nose 2 (two) times daily as needed.  Marland Kitchen BIOTIN PO Take by mouth.  . cetirizine (ZYRTEC) 10 MG tablet  Take 1 tablet (10 mg total) by mouth daily.  . clonazePAM (KLONOPIN) 0.5 MG tablet Half to full tab daily as needed for anxiety.  Marland Kitchen HYDROcodone-acetaminophen (NORCO) 10-325 MG per tablet Take 1 tablet by mouth every 6 (six) hours as needed.  Marland Kitchen ibuprofen (ADVIL,MOTRIN) 200 MG tablet Take 600 mg by mouth every 6 (six) hours as needed for pain.  . methocarbamol (ROBAXIN) 500 MG tablet Take 1 tablet (500 mg total) by mouth 3 (three) times daily.  . montelukast  (SINGULAIR) 10 MG tablet Take 1 tablet (10 mg total) by mouth at bedtime.  . Multiple Vitamins-Minerals (WOMENS MULTIVITAMIN PLUS PO) Take by mouth.  . Omega-3 Fatty Acids (FISH OIL PO) Take by mouth.  Marland Kitchen omeprazole (PRILOSEC) 20 MG capsule Take 20 mg by mouth daily.  . Probiotic Product (PROBIOTIC DAILY PO) Take by mouth.  . traMADol (ULTRAM) 50 MG tablet 1-2 tabs by mouth Q8 hours, maximum 6 tabs per day.  . venlafaxine XR (EFFEXOR-XR) 150 MG 24 hr capsule TAKE 2 TO 3 CAPSULES BY MOUTH EVERY MORNING DEPENDING ON SIDE EFFECTS.  Marland Kitchen Vitamin D, Ergocalciferol, (DRISDOL) 50000 UNITS CAPS capsule Take 1 capsule (50,000 Units total) by mouth every 7 (seven) days. Recheck Vitamin D in 3 Months  . zolpidem (AMBIEN) 10 MG tablet Take 1/2 - 1 tab at night for up to 8 hours of sleep     Substance Abuse History:  Denies.  She drinks infrequently and limited amount.  Family History; Family History  Problem Relation Age of Onset  . Hypertension Mother   . Depression Mother   . Hypertension Father       Biopsychosocial History:  She grew up with her parents at age 55 her parents divorced because her dad cheated once and twice. It is difficult growing up she became distant from her dad and more close to her mom. Currently she's working of the Engineering geologist. She has 2 kids ages 34 and 33. There is no legal issue. Currently she is seperated.   Labs:  No results found for this or any previous visit (from the past 2160 hour(s)).     Musculoskeletal: Strength & Muscle Tone: within normal limits Gait & Station: normal Patient leans: N/A  Mental Status Examination;   Psychiatric Specialty Exam: Physical Exam  Constitutional: She appears well-developed and well-nourished.  Skin: She is not diaphoretic.    Review of Systems  Constitutional: Negative.   Cardiovascular: Negative for chest pain.  Gastrointestinal: Negative for nausea.  Skin: Negative for rash.  Neurological: Negative for  tremors.  Psychiatric/Behavioral: Positive for depression. Negative for suicidal ideas and substance abuse. The patient is nervous/anxious.     Blood pressure 126/78, pulse 100, height 5' 5"  (1.651 m), weight 199 lb (90.266 kg).Body mass index is 33.12 kg/(m^2).  General Appearance: Casual  Eye Contact::  Fair  Speech:  Slow  Volume:  Decreased  Mood:  dysphoric  Affect:  Constricted and Depressed  Thought Process:  Coherent  Orientation:  Full (Time, Place, and Person)  Thought Content:  Rumination  Suicidal Thoughts:  No  Homicidal Thoughts:  No  Memory:  Immediate;   Fair Recent;   Fair  Judgement:  Fair  Insight:  Fair  Psychomotor Activity:  Decreased  Concentration:  Fair  Recall:  Fair  Akathisia:  Negative  Handed:  Right  AIMS (if indicated):     Assets:  Desire for Improvement Vocational/Educational  Sleep:        Assessment: Axis I:  Major depressive disorder recurrent moderate. History of PTSD.] Related to her sister dying in a car accident. Generalized anxiety disorder. Adjustment disorder with depressed mood and anxiety  Axis II: Deferred  Axis III:  Past Medical History  Diagnosis Date  . Asthma   . History of TMJ disorder   . Anxiety     Axis IV: Separation   Treatment Plan and Summary: Her depression is related with her psychosocial issue and relationship currently she is separated. Medication with.doing much but will help some I strongly recommend psychotherapy.  Increase Abilify to 15 mg she has 10 tablets she will start taking 1-1/2 of them. No refills given since she has refills already from Dr. Tonita Cong not to take anxiety medication Klonopin and Ambien on a regular basis. She does have refill on them. Pertinent Labs and Relevant Prior Notes reviewed. Medication Side effects, benefits and risks reviewed/discussed with Patient. Time given for patient to respond and asks questions regarding the Diagnosis and Medications. Safety concerns  and to report to ER if suicidal or call 911. Relevant Medications refilled or called in to pharmacy. Discussed weight maintenance and Sleep Hygiene. Follow up with Primary care provider in regards to Medical conditions. Recommend compliance with medications and follow up office appointments. Discussed to avail opportunity to consider or/and continue Individual therapy with Counselor. Greater than 50% of time was spend in counseling and coordination of care with the patient.  Schedule for Follow up visit in 4 weeks  or call in earlier as necessary.   Merian Capron, MD 05/23/2014

## 2014-05-24 ENCOUNTER — Encounter (HOSPITAL_COMMUNITY): Payer: Self-pay | Admitting: Licensed Clinical Social Worker

## 2014-05-24 ENCOUNTER — Telehealth: Payer: Self-pay | Admitting: Family Medicine

## 2014-05-24 ENCOUNTER — Ambulatory Visit (INDEPENDENT_AMBULATORY_CARE_PROVIDER_SITE_OTHER): Payer: 59 | Admitting: Licensed Clinical Social Worker

## 2014-05-24 DIAGNOSIS — E559 Vitamin D deficiency, unspecified: Secondary | ICD-10-CM

## 2014-05-24 DIAGNOSIS — F331 Major depressive disorder, recurrent, moderate: Secondary | ICD-10-CM | POA: Diagnosis not present

## 2014-05-24 LAB — VITAMIN D 25 HYDROXY (VIT D DEFICIENCY, FRACTURES): Vit D, 25-Hydroxy: 18 ng/mL — ABNORMAL LOW (ref 30–100)

## 2014-05-24 MED ORDER — VITAMIN D (ERGOCALCIFEROL) 1.25 MG (50000 UNIT) PO CAPS: 50000.0000 [IU] | ORAL_CAPSULE | ORAL | Status: DC

## 2014-05-24 NOTE — Progress Notes (Signed)
Patient:   Tracey Patterson   DOB:   1981/03/12  MR Number:  [id number]  Location:  [facility] [address] [place] [address] Dept: [phone]           Date of Service:   05/24/14  Start Time:   12:35pm End Time:   1:35pm  Provider/Observer:  [facility]       Billing Code/Service: [phone]  Comprehensive Clinical Assessment  Information for assessment provided by: patient   Chief Complaint:    Depression and anxiety     Presenting Problem/Symptoms: A lot of anxiety, stress, and depression.  A lot of it stems from my husband.  He cheated on me.  She got pregnant.  The baby is 32 months old now."   Husband has decided he wants to move out.    We have a lot of debt, so there is stress there.      Previous MH/SA diagnoses: none      Mental Health Symptoms:    Depression:  PHQ-9= 15  Current symptoms include depressed mood, anhedonia, psychomotor agitation, fatigue, feelings of worthlessness/guilt, difficulty concentrating, hopelessness, weight gain, increased appetite,.    Onset approximately 2 years ago, gradually worsening since that time.    Past episodes of depression: yes, post-partum with 1st child, and an episode approximately 3 years ago  Anxiety: Marked restlessness or feeling on edge, easily fatigued, irritability, difficulty concentrating, muscle tension, sleep disturbance Worries most of the day.   Panic Attacks: last one occurred about 2 months ago, occurs maybe 1-2 times a month  Self-Harm Potential: Thoughts of Self-Harm: none, Method: na Availability of means: na Is there a family history of suicide? no Previous attempts? no Preoccupation with death? no History of acts of self-harm? no  Dangerousness to Others Potential: Denies Family history of violence? no Active psychosis? no Previous attempts?  no    Mania/hypomania: na   Psychosis: na     Abuse/Trauma History: Sister died in a car accident    PTSD symptoms: intrusive thoughts about traumatic event, nightmares           Mental Status  Interactions:    Active   Attention:   Good  Memory:   I feel like I forget things a lot...when the kids have appointments..I feel like I have to write stuff down in order to remember.  Loses track of what she has planned to do.  Speech:   Normal, quiet  Flow of Thought:  Normal  Thought Content:  Rumination  Orientation:   person, place and time/date  Judgment:   Good  Affect/Mood:   Anxious  Insight:   Fair       Medical History:    Past Medical History  Diagnosis Date  . Asthma   . History of TMJ disorder   . Anxiety    Fibromyalgia Back surgery a couple months ago   Current medications:  Abilify 43m daily Ambien 126m1/2 to 1 tablet at bedtime Effexor XR 15057m-3 capsules every morning Klonopin 0.5mg43m2 to 1 tablet daily as needed Zyrtec 10mg75mly Ibuprofen 600mg 10meeded Singulair 10mg a40mdtime Tramadol 50mg 1-40mblets as needed            Mental Health/Substance Use Treatment History:    none       Family Med/Psych History:  Family History  Problem Relation Age of Onset  .  Hypertension Mother   . Depression Mother   . Hypertension Father        Substance Use History:    Alcohol once every 2-3 weeks    Used regularly for 2 months after she found out about husband was cheating       Marital Status: Married to [place] for over 12 years.    He works a lot, no less than 60 hrs per week.  Operations manager   Takes care of his baby several nights a week.  Estimates he spends two nights per week at her home.  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  Dad cheated on mom.   Dad passed away two years ago. Pretty close with her mom who lives in [place].  Sister, Missy (21)- "She is really close to my kids."  "It's up and down with [place]."    Sister Golden Circle (22 years older) passed away in 18-Jun-2006 in a car accident.  They had been very close.    Close with her nieces and nephews.     Other Social Supports: some friends, but not too many close ones  One friend she has had since high school.  He recently went through a divorce.  I think he may have feelings for me.  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    Has been looking for a church.  Hobbies:  Reading, listening to music, used to play the clarinet, used to do [employer], playing with my kids  Strengths/Protective Factors: pretty easy-going, a good listener, empathetic        Impression/DX: F33.1  Major Depressive Disorder, recurrent, moderate with anxious distress  Disposition/Plan: Recommending individual therapy with a focus on helping patient identify and change negative or irrational thinking patterns and teach her skills for distress tolerance, emotion regulation, mindfulness, and assertiveness.

## 2014-05-24 NOTE — Telephone Encounter (Signed)
Pt.notified

## 2014-05-24 NOTE — Telephone Encounter (Signed)
Tracey Patterson, Will you please let patient know that her Vitamin D level has not improved.  I'd recommend continuing with a weekly vitamin D supplement that I sent to her pharmacy along with a OTC vitamin D supplement at a dose of [phone] Units daily.

## 2014-06-14 ENCOUNTER — Ambulatory Visit (HOSPITAL_COMMUNITY): Payer: Self-pay | Admitting: Psychiatry

## 2014-06-21 ENCOUNTER — Ambulatory Visit (INDEPENDENT_AMBULATORY_CARE_PROVIDER_SITE_OTHER): Payer: 59 | Admitting: Licensed Clinical Social Worker

## 2014-06-21 DIAGNOSIS — F321 Major depressive disorder, single episode, moderate: Secondary | ICD-10-CM

## 2014-06-21 DIAGNOSIS — F331 Major depressive disorder, recurrent, moderate: Secondary | ICD-10-CM

## 2014-06-21 NOTE — Psych (Signed)
   THERAPIST PROGRESS NOTE  Session Time: 9-10am  Participation Level: Active  Behavioral Response: Well GroomedAlertAnxious  Type of Therapy: Individual Therapy  Treatment Goals addressed: Coping  Interventions: Solution Focused and Supportive  Therapist Interventions: Gathered information about significant events and changes in mood and functioning since last seen about a month ago.  Had her complete a PHQ-9.   Discussed how patient has been communicating her thoughts and feelings with others and how this has helped her to experience less anxiety.     Summary:  Reported that husband got a job in [place].  Starts next week.  Indicated some sense of relief about knowing his plans.  There is still anxiety about uncertainty about future for husband's baby boy.   PHQ-9 indicates a reduction in depressive symptoms in the past month.   Indicated she feels good about addressing her worries with others rather than keep those feelings to herself.        Suicidal/Homicidal: Denied both  Plan: Return again in approximately a week.  Will discuss causes of depression and anxiety.  Diagnosis: Major Depressive Disorder, moderate    [name] 06/21/2014

## 2014-06-28 ENCOUNTER — Ambulatory Visit (INDEPENDENT_AMBULATORY_CARE_PROVIDER_SITE_OTHER): Payer: 59 | Admitting: Licensed Clinical Social Worker

## 2014-06-28 DIAGNOSIS — F331 Major depressive disorder, recurrent, moderate: Secondary | ICD-10-CM

## 2014-06-28 NOTE — Psych (Signed)
   THERAPIST PROGRESS NOTE  Session Time: 83:66QH-47:65YY  Participation Level: Active  Behavioral Response: Casual, Alert, Somewhat Flat affect  Type of Therapy: Individual Therapy  Treatment Goals addressed: Coping  Interventions: Psycho-education  Therapist Interventions:  Talked about depression, symptoms, causes.  Explained how it differs from sadness.  Prompted patient to describe her own experience with depression.   Explored how patient has been coping with recent stressors.  Discussed how it is important to stay active even though you don't feel like it.       Summary:    She said she has been working on focusing on what she can do, taking one day at a time, continuing to work, and setting small achievable goals.   Husband has started new job in [place].  Communicates with patient daily.   Seemed to have a good understanding of what depression is.     Suicidal/Homicidal: Denied both  Plan: Will focus next session on psycho-education on anxiety  Diagnosis: Major Depressive Disorder, moderate    [name] 06/28/2014

## 2014-07-05 ENCOUNTER — Encounter (HOSPITAL_COMMUNITY): Payer: Self-pay | Admitting: Licensed Clinical Social Worker

## 2014-07-05 ENCOUNTER — Ambulatory Visit (HOSPITAL_COMMUNITY): Payer: Self-pay | Admitting: Licensed Clinical Social Worker

## 2014-07-05 ENCOUNTER — Ambulatory Visit (INDEPENDENT_AMBULATORY_CARE_PROVIDER_SITE_OTHER): Payer: 59 | Admitting: Licensed Clinical Social Worker

## 2014-07-05 VITALS — BP 130/78 | HR 82 | Ht 65.0 in | Wt 200.0 lb

## 2014-07-05 DIAGNOSIS — F321 Major depressive disorder, single episode, moderate: Secondary | ICD-10-CM | POA: Diagnosis not present

## 2014-07-05 DIAGNOSIS — F331 Major depressive disorder, recurrent, moderate: Secondary | ICD-10-CM

## 2014-07-05 NOTE — Psych (Signed)
   THERAPIST PROGRESS NOTE  Session Time: 81:15BW-62:03TD  Participation Level: Active  Behavioral Response: Casual, Alert, Euthymic  Type of Therapy: Individual Therapy  Treatment Goals addressed: Coping  Interventions: Assertive communication  Therapist Interventions:  Explored how current stressors are affecting patient.  Provided patient with positive feedback for being assertive in her communication with her husband.  Discussed how this differs from the passive way she dealt with problems in her marriage in the past.           Summary:   She hurt her back while moving a patient at work.  Now out on workers comp.  Worried about paying her bills.   Patient has been more open with her husband about how current stressors are affecting her.  His first instinct was to be defensive, but after talking for a while he has been able to admit that he was taking what she was saying in the wrong way and that he will "try to do better." When asked about whether or not she has felt more in control of her feelings she said, "I don't know if I have more control, but I am accepting my feelings more."     Suicidal/Homicidal: Denied both  Plan: Scheduled to return next week.  Diagnosis: Major Depressive Disorder, moderate    [name] 07/05/2014

## 2014-07-12 ENCOUNTER — Ambulatory Visit (INDEPENDENT_AMBULATORY_CARE_PROVIDER_SITE_OTHER): Payer: 59 | Admitting: Licensed Clinical Social Worker

## 2014-07-12 DIAGNOSIS — F331 Major depressive disorder, recurrent, moderate: Secondary | ICD-10-CM

## 2014-07-12 DIAGNOSIS — F411 Generalized anxiety disorder: Secondary | ICD-10-CM

## 2014-07-12 DIAGNOSIS — F321 Major depressive disorder, single episode, moderate: Secondary | ICD-10-CM | POA: Diagnosis not present

## 2014-07-12 NOTE — Psych (Signed)
   THERAPIST PROGRESS NOTE  Session Time: 66:29UT-65:46TK  Participation Level: Active  Behavioral Response: Casual, Alert, Euthymic  Type of Therapy: Individual Therapy  Treatment Goals addressed: Coping  Interventions: DBT- mindfulness  Therapist Interventions: Introduced the concept of mindfulness.  Emphasized how learning to focus on the present can help you to feel more in control of your emotions.  Explained how it can be useful to practice at times when you catch yourself having unhelpful thoughts.  Described how you can choose to do tasks in a mindful way.  Guided patient through practicing mindfulness in different ways including focusing on an object and mindful breathing.  Encouraged patient to practice the skills regularly in addition to times of distress.          Summary:   Seemed to understand concepts presented.  Cooperative about practicing the exercises.  Commented on how she could see the skills being useful to her.   Indicated she has continued to struggle when it comes feeling in control of her emotions.  Reported experiencing a considerable amount of anxiety in the past week related to unknowns about when she will be able to return to work.  Experienced a great sense of relief upon being cleared to work yesterday.                                                                                                          Suicidal/Homicidal: Denied both  Plan: Scheduled to return next week.  May expand on mindfulness.  Diagnosis:   Generalized Anxiety Disorder  Major Depressive Disorder, moderate    [name] 07/12/2014

## 2014-07-19 ENCOUNTER — Ambulatory Visit (INDEPENDENT_AMBULATORY_CARE_PROVIDER_SITE_OTHER): Payer: 59 | Admitting: Licensed Clinical Social Worker

## 2014-07-19 DIAGNOSIS — F411 Generalized anxiety disorder: Secondary | ICD-10-CM | POA: Diagnosis not present

## 2014-07-19 DIAGNOSIS — F331 Major depressive disorder, recurrent, moderate: Secondary | ICD-10-CM

## 2014-07-19 NOTE — Psych (Signed)
   THERAPIST PROGRESS NOTE  Session Time: 9:00am-10:00am  Participation Level: Active  Behavioral Response: Casual, Alert, Euthymic  Type of Therapy: Individual Therapy  Treatment Goals addressed: Coping  Interventions: Supportive  Therapist Interventions:  Had patient complete a PHQ-9.  Discussed changes since she last filled one out.   Explored how patient has coped with her son's anxiety and problems with inattention as well as learning challenges.  Also explored how she copes with her daughter's temper tantrums.  Provided positive feedback.  Praised her for advocating for her children.     Summary:   PHQ-9 score was 13 (moderate).  Patient noted that she is under more stress at this time because she is having to parent as if she is a single parent.  Described how she always suspected that her son had some developmental issues and excessive anxiety and how it took several years to find a doctor who took her concerns seriously.  Son is now in therapy and has an IEP at school.   Explained how over time she has learned that she needs to stay calm when interacting with her son and step away when those interactions become too overwhelming.    Reported feeling like she has more control of her emotions.  Provided an example of being assertive at work.     Suicidal/Homicidal: Denied both  Plan: Scheduled to return next week.   May introduce Thought Journal.  Diagnosis:   Generalized Anxiety Disorder  Major Depressive Disorder, moderate    [name] 07/19/2014

## 2014-07-26 ENCOUNTER — Ambulatory Visit (HOSPITAL_COMMUNITY): Payer: Self-pay | Admitting: Licensed Clinical Social Worker

## 2014-08-02 ENCOUNTER — Ambulatory Visit (HOSPITAL_COMMUNITY): Payer: Self-pay | Admitting: Licensed Clinical Social Worker

## 2014-08-19 ENCOUNTER — Encounter: Payer: Self-pay | Admitting: Family Medicine

## 2014-08-19 DIAGNOSIS — E559 Vitamin D deficiency, unspecified: Secondary | ICD-10-CM

## 2014-08-31 LAB — VITAMIN D 25 HYDROXY (VIT D DEFICIENCY, FRACTURES): Vit D, 25-Hydroxy: 30 ng/mL (ref 30–100)

## 2014-09-01 ENCOUNTER — Telehealth: Payer: Self-pay | Admitting: Family Medicine

## 2014-09-01 MED ORDER — VITAMIN D (ERGOCALCIFEROL) 1.25 MG (50000 UNIT) PO CAPS: 50000.0000 [IU] | ORAL_CAPSULE | ORAL | Status: DC

## 2014-09-01 NOTE — Telephone Encounter (Signed)
Tracey Patterson, Will you please let patient know that her vitamin d level was just barely in the normal range.  I'd recommend taking another three months of the weekly vitamin d supplement that [facility] [place].

## 2014-09-02 NOTE — Telephone Encounter (Signed)
Pt.notified

## 2014-09-05 ENCOUNTER — Ambulatory Visit (HOSPITAL_COMMUNITY): Payer: Self-pay | Admitting: Licensed Clinical Social Worker

## 2014-09-18 ENCOUNTER — Ambulatory Visit (HOSPITAL_COMMUNITY): Payer: Self-pay | Admitting: Licensed Clinical Social Worker

## 2014-09-27 ENCOUNTER — Encounter: Payer: Self-pay | Admitting: Family Medicine

## 2014-09-27 ENCOUNTER — Ambulatory Visit (INDEPENDENT_AMBULATORY_CARE_PROVIDER_SITE_OTHER): Payer: 59 | Admitting: Family Medicine

## 2014-09-27 VITALS — BP 111/71 | HR 73 | Temp 97.5°F | Wt 195.0 lb

## 2014-09-27 DIAGNOSIS — L0390 Cellulitis, unspecified: Secondary | ICD-10-CM | POA: Diagnosis not present

## 2014-09-27 DIAGNOSIS — J029 Acute pharyngitis, unspecified: Secondary | ICD-10-CM

## 2014-09-27 MED ORDER — DOXYCYCLINE HYCLATE 100 MG PO TABS: ORAL_TABLET | ORAL | Status: AC

## 2014-09-27 NOTE — Progress Notes (Signed)
CC: Tracey Patterson is a 34 y.o. female is here for Sore Throat   Subjective: HPI:  2 days of headache, sore throat, myalgias, fatigue that is moderate in severity present all hours of the day. No interventions as of yet but it seems it is getting worse on a daily basis. Son recently diagnosed with strep. No other known sick contacts. Nothing seems to make symptoms better or worse. She denies fevers, confusion, difficulty swallowing, shortness of breath, chest pain, nasal congestion or sinus pressure. She also got bit by an unknown insect on the right neck site is painful and seems to be getting redder on a daily basis.   Review Of Systems Outlined In HPI  Past Medical History  Diagnosis Date  . Asthma   . History of TMJ disorder   . Anxiety     Past Surgical History  Procedure Laterality Date  . Knee arthroscopy  1998    left  . Tonsillectomy    . Laparoscopic tubal ligation  02/28/2012    Procedure: LAPAROSCOPIC TUBAL LIGATION;  Surgeon: Cyril Mourning, MD;  Location: [facility];  Service: Gynecology;  Laterality: Bilateral;  . Back surgery  December 2015   Family History  Problem Relation Age of Onset  . Hypertension Mother   . Depression Mother   . Hypertension Father     History   Social History  . Marital Status: Married    Spouse Name: N/A  . Number of Children: N/A  . Years of Education: N/A   Occupational History  . Not on file.   Social History Main Topics  . Smoking status: Never Smoker   . Smokeless tobacco: Never Used  . Alcohol Use: Yes  . Drug Use: No  . Sexual Activity: Not Currently   Other Topics Concern  . 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 Conjunctivae clear.  External ears unremarkable, canals clear with intact TMs with appropriate landmarks.  Middle ear appears open without effusion. Pink inferior turbinates.  Moist mucous membranes, pharynx without inflammation nor lesions.  Neck supple without palpable lymphadenopathy nor abnormal masses. Lungs: Clear to auscultation bilaterally, no wheezing/ronchi/rales.  Comfortable work of breathing. Good air movement. Extremities: No peripheral edema.  Strong peripheral pulses.  Mental Status: No depression, anxiety, nor agitation. Skin: Warm and dry. Papule on the right anterior neck with a circular erythematous halo surrounding this lesion.  Assessment & Plan: Tywana was seen today for sore throat.  Diagnoses and all orders for this visit:  Sore throat Orders: -     doxycycline (VIBRA-TABS) 100 MG tablet; One by mouth twice a day for ten days.  Cellulitis, unspecified cellulitis site, unspecified extremity site, unspecified laterality  Rapid strep is negative and clinical suspicion for strep is low other than her son having it. I'm more concerned about the blood bite on her right neck with the erythema that could be suggestive of Lyme's disease. Additionally this could just be simply a mild cellulitis. Regardless start doxycycline which should help with either. Discussed ibuprofen as needed for sore throat.   Return if symptoms worsen or fail to improve.

## 2014-10-02 ENCOUNTER — Ambulatory Visit (HOSPITAL_COMMUNITY): Payer: Self-pay | Admitting: Licensed Clinical Social Worker

## 2014-10-08 ENCOUNTER — Ambulatory Visit (HOSPITAL_COMMUNITY): Payer: Self-pay | Admitting: Licensed Clinical Social Worker

## 2014-10-15 ENCOUNTER — Ambulatory Visit (HOSPITAL_COMMUNITY): Payer: Self-pay | Admitting: Licensed Clinical Social Worker

## 2014-10-22 ENCOUNTER — Ambulatory Visit (HOSPITAL_COMMUNITY): Payer: Self-pay | Admitting: Licensed Clinical Social Worker

## 2014-11-18 ENCOUNTER — Other Ambulatory Visit: Payer: Self-pay | Admitting: Family Medicine

## 2014-11-18 NOTE — Telephone Encounter (Signed)
Tracey Patterson, Rx placed in in-box ready for pickup/faxing.  

## 2015-02-26 ENCOUNTER — Ambulatory Visit (INDEPENDENT_AMBULATORY_CARE_PROVIDER_SITE_OTHER): Payer: 59 | Admitting: Family Medicine

## 2015-02-26 ENCOUNTER — Encounter: Payer: Self-pay | Admitting: Family Medicine

## 2015-02-26 VITALS — BP 118/79 | HR 84 | Wt 188.0 lb

## 2015-02-26 DIAGNOSIS — B9689 Other specified bacterial agents as the cause of diseases classified elsewhere: Secondary | ICD-10-CM

## 2015-02-26 DIAGNOSIS — A499 Bacterial infection, unspecified: Secondary | ICD-10-CM

## 2015-02-26 DIAGNOSIS — F32A Depression, unspecified: Secondary | ICD-10-CM

## 2015-02-26 DIAGNOSIS — K219 Gastro-esophageal reflux disease without esophagitis: Secondary | ICD-10-CM

## 2015-02-26 DIAGNOSIS — J329 Chronic sinusitis, unspecified: Secondary | ICD-10-CM | POA: Diagnosis not present

## 2015-02-26 DIAGNOSIS — F418 Other specified anxiety disorders: Secondary | ICD-10-CM | POA: Diagnosis not present

## 2015-02-26 DIAGNOSIS — J4520 Mild intermittent asthma, uncomplicated: Secondary | ICD-10-CM | POA: Diagnosis not present

## 2015-02-26 DIAGNOSIS — F419 Anxiety disorder, unspecified: Secondary | ICD-10-CM

## 2015-02-26 DIAGNOSIS — F329 Major depressive disorder, single episode, unspecified: Secondary | ICD-10-CM

## 2015-02-26 MED ORDER — CLONAZEPAM 0.5 MG PO TABS: ORAL_TABLET | ORAL | Status: DC

## 2015-02-26 MED ORDER — ALBUTEROL SULFATE HFA 108 (90 BASE) MCG/ACT IN AERS: 2.0000 | INHALATION_SPRAY | RESPIRATORY_TRACT | Status: DC | PRN

## 2015-02-26 MED ORDER — ZOLPIDEM TARTRATE 10 MG PO TABS: ORAL_TABLET | ORAL | Status: DC

## 2015-02-26 MED ORDER — PANTOPRAZOLE SODIUM 40 MG PO TBEC: 40.0000 mg | DELAYED_RELEASE_TABLET | Freq: Every day | ORAL | Status: DC

## 2015-02-26 NOTE — Progress Notes (Signed)
CC: Tracey Patterson is a 34 y.o. female is here for Medication Refill and medication questions   Subjective: HPI:  Follow GERD: Omeprazole is not helping therefore she switched to ranitidine. Despite taking this twice a day she still requires Tums most days a week. She gets a sensation of burning behind her sternum that radiates up into the back of throat. If she takes Tums as goes away for about an hour or 2. Symptoms are worse when lying down flat. Nothing else seems to make better or worse. Symptoms are present on a daily basis. Denies abdominal pain or unintentional weight loss.  Follow-up anxiety: Anxiety has worsened ever since she and her husband decided to separate. Symptoms are worse in the evening when she is trying to fall asleep all she can think about is the separation. Ambien and clonazepam have helped, she only takes the Ambien on days when she is switching back to morning shift from the third shift. Denies any depression or any other mental disturbance  Requesting refills on albuterol. Using her albuterol less than 2 times a week with no nocturnal symptoms. Denies any shortness of breath coughing or wheezing currently.   Review Of Systems Outlined In HPI  Past Medical History  Diagnosis Date  . Asthma   . History of TMJ disorder   . Anxiety     Past Surgical History  Procedure Laterality Date  . Knee arthroscopy  1998    left  . Tonsillectomy    . Laparoscopic tubal ligation  02/28/2012    Procedure: LAPAROSCOPIC TUBAL LIGATION;  Surgeon: Cyril Mourning, MD;  Location: [facility];  Service: Gynecology;  Laterality: Bilateral;  . Back surgery  December 2015   Family History  Problem Relation Age of Onset  . Hypertension Mother   . Depression Mother   . Hypertension Father     Social History   Social History  . Marital Status: Married    Spouse Name: N/A  . Number of Children: N/A  . Years of Education: N/A   Occupational History  . Not on file.   Social  History Main Topics  . Smoking status: Never Smoker   . Smokeless tobacco: Never Used  . Alcohol Use: Yes  . Drug Use: No  . Sexual Activity: Not Currently   Other Topics Concern  . 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 General: Alert and Oriented, No Acute Distress HEENT: Pupils equal, round, reactive to light. Conjunctivae clear.  External ears unremarkable.  Moist mucous membranes. Lungs: Clear and comfortable work of breathing, speaking in full sentences without accessory muscle use. Cardiac: Regular rate and rhythm.  Neuro: CN II-XII grossly intact, gait normal. Extremities: No peripheral edema.  Strong peripheral pulses.  Mental Status: No depression, anxiety, nor agitation. Logical though process. Skin: Warm and  dry. Assessment & Plan: Avriel was seen today for medication refill and medication questions.  Diagnoses and all orders for this visit:  Gastroesophageal reflux disease, esophagitis presence not specified -     pantoprazole (PROTONIX) 40 MG tablet; Take 1 tablet (40 mg total) by mouth daily.  Anxiety and depression -     clonazePAM (KLONOPIN) 0.5 MG tablet; Half to full tab daily as needed for anxiety.  Asthma, chronic, mild intermittent, uncomplicated  Bacterial sinusitis -     albuterol (PROVENTIL HFA;VENTOLIN HFA) 108 (90 BASE) MCG/ACT inhaler; Inhale 2 puffs into the lungs every 4 (four) hours as needed for wheezing (cough, shortness of breath or  wheezing.).  Other orders -     zolpidem (AMBIEN) 10 MG tablet; TAKE 1/2 TO 1 TABLET BY MOUTH AT NIGHT FOR UP TO 8 HOURS OF SLEEP   GERD: Uncontrolled chronic condition stop ranitidine and begin Protonix call if no better after 1 week Anxiety: Worsening due to separation, no change to medication regimen continue clonazepam as needed for sleep. Asthma: Controlled continue as needed use of albuterol. Please disregard diagnosis of bacterial sinusitis above this was placed in error.  Return in about 3 months (around 05/27/2015) for Mood.

## 2015-03-27 ENCOUNTER — Ambulatory Visit (INDEPENDENT_AMBULATORY_CARE_PROVIDER_SITE_OTHER): Payer: 59 | Admitting: Osteopathic Medicine

## 2015-03-27 ENCOUNTER — Encounter: Payer: Self-pay | Admitting: Osteopathic Medicine

## 2015-03-27 VITALS — BP 109/68 | HR 90 | Temp 97.4°F | Ht 66.0 in | Wt 188.0 lb

## 2015-03-27 DIAGNOSIS — R6889 Other general symptoms and signs: Secondary | ICD-10-CM

## 2015-03-27 MED ORDER — BENZONATATE 200 MG PO CAPS: 200.0000 mg | ORAL_CAPSULE | Freq: Two times a day (BID) | ORAL | Status: DC | PRN

## 2015-03-27 NOTE — Patient Instructions (Signed)
DR. Aysel Gilchrest'S HOME CARE INSTRUCTIONS: UPPER RESPIRATORY ILLNESS AND SINUSITIS   FRIST, A FEW NOTES ON OVER-THE-COUNTER MEDICATIONS!  . USE CAUTION - MANY OVER-THE-COUNTER MEDICATIONS COME IN COMBINATIONS OF MULTIPLE GENERICS. FOR INSTANCE, NYQUIL HAS TYLENOL + COUGH MEDICINE + AN ANTIHISTAMINE, SO BE CAREFUL YOU'RE NOT TAKING A COMBINATION MEDICINE WHICH CONTAINS MEDICATIONS YOU'RE ALSO TAKING SEPARATELY (LIKE NYQUIL SYRUP AS WELL AS TYLENOL PILLS).  . YOUR PHARMACIST CAN HELP YOU AVOID MEDICATION INTERACTIONS AND DUPLICATIONS - ASK FOR THEIR HELP IF YOU ARE CONFUSED OR UNSURE ABOUT WHAT TO PURCHASE OVER-THE-COUNTER!  . REMEMBER - IF YOU'RE EVER CONCERNED ABOUT MEDICATION SIDE EFFECTS, OR IF YOU'RE EVER CONCERNED YOUR SYMPTOMS ARE GETTING WORSE DESPITE TREATMENT, PLEASE CALL THE OFFICE!   TREAT SINUS CONGESTION, RUNNY NOSE & POSTNASAL DRIP: . Treat to increase airflow through sinuses, decrease congestion pain and prevent bacterial growth!  . Remember, only 0.5-2% of sinus infections are due to a bacteria, the rest are due to a virus (usually the common cold)! Trust your doctor when he or she decides whether or not you really need an antibiotic!   NASAL SPRAYS: generally safe and should not interact with other medicines. Can take any of these medications, either alone or together... FLONASE (FLUTICASONE) - 2 sprays in each nostril twice per day (also a great allergy medicine to use long-term!) AFRIN (OXYMETOLAZONE) - use sparingly because it will cause rebound congestion, NEVER USE IN KIDS   SALINE NASAL SPRAY- no limit, but avoid use after other nasal sprays or it can wash the medicine away  ANTIHISTAMINES: Helps dry out runny nose and decreases postnasal drip. Benadryl may cause drowsiness but other preparations should not be as sedating. Certain kinds are not as safe in elderly individuals. OK to use unless Dr A says otherwise.  Only use one of the following... BENADRYL (DIPHENHYDRAMINE) -  25-50 mg every 6 hours ZYRTEC (CETIRIZINE) - 5-10 mg daily CLARITIN (LORATIDINE) - less potent. 10 mg daily ALLEGRA (FEXOFENADINE) - least likely to cause drowsiness! 180 mg daily or 60 mg twice per day  DECONGESTANTS: Helps dry out runny nose and helps with sinus pain. May cause insomnia, or sometimes elevated heart rate. Can cause problems if used often in people with high blood pressure. OK to use unless Dr A says otherwise. NEVER USE IN KIDS UNDER 2 YEARS OLD. Only use one of the following... SUDAFED (PSEUDOEPHEDINE) - 60 mg every 4 - 6 hours, also comes in 120 mg extended release every 12 hours, maximum 240 mg in 24 hours.  SUDAED PE (PHENYLEPHRINE) - 10 mg every 4 - 6 hours, maximum 60 mg per day  COMBINATIONS OF ANTIHISTAMINE + DECONGESTANT: these usually require you to show your ID at the pharmacy counter. You can also purchase these medicines separately as noted above.  Only use one of the following... ZYRTEC-D (CETIRIZINE + PSEUDOEPHEDRINE) - 12 hour formulation as directed CLARITIN-D (LORATIDINE + PSEUDOEPHEDRINE) - 12 and 24 hour formulations as directed ALLEGRA-D (FEXOFENADINE + PSEUDOEPHEDRINE) - 12 and 24 hour formulations as directed  PRESCRIPTION TREATMENT FOR SINUS PROBLEMS: Can include nasal sprays, pills, or antibiotics in the case of true bacterial infection. Not everyone needs an antibiotic but there are other medicines which will help you feel better while your body fights the infection!   TREAT COUGH & SORE THROAT: . Remember, cough is the body's way of protecting your airways and lungs, it's a hard-wired reflex that is tough for medicines to treat!  . Irritation to the airways will cause   cough. This irritation is usually caused by upper airway problems like postnasal drip (treat as above) and viral sore throat, but in severe cases can be due to lower airway problems like bronchitis or pneumonia, which a doctor can usually hear on exam of your lungs or an X-ray. . Sore  throat is almost always due to a virus, but occasionally caused by Strep, which requires antibiotics.  . Exercise and smoking may make cough worse - take it easy while you're sick, and QUIT SMOKING!   . Cough due to viral infection can linger for 2 weeks or so. If you're coughing longer than you think you should, or if the cough is severe, please make an appointment in the office - you may need a chest X-ray.   EXPECTORANT: Used to help clear airways, take these with PLENTY of water to help thin mucus secretions and make the mucus easier to cough up   Only use one of the following... ROBITUSSIN (DEXTROMETHORPHAN OR GUAIFENISEN depending on formulation)  MUCINEX (GUAIFENICEN) - usually longer acting  COUGH DROPS/LOZENGES: Whichever over-the-counter agent you prefer!  Here are some suggestions for ingredients to look for (can take both)... BENZOCAINE - numbing effect, also in CHLORASEPTIC THROAT SPRAY MENTHOL - cooling effect  HONEY: has gone head-to-head in several clinical trials with prescription cough medicines and found to be equally effective! Try 1 Teaspoon Honey + 2 Drops Lemon Juice, as much as you want to use. NONE FOR KIDS UNDER AGE 2  HERBAL TEA: There are certain ingredients which help "coat the throat" to relieve pain  such as ELM BARK, LICORICE ROOT, MARSHMALLOW ROOT  PRESCRIPTION TREATMENT FOR COUGH: Reserved for severe cases. Can include pills, syrups or inhalers.    TREAT ACHES & PAINS, FEVER: . Illness causes aches, pains and fever as your body increases its natural inflammation response to help fight the infection.  . Rest, good hydration and nutrition, and taking anti-inflammatory medications will help.  . Remember: a true fever is a temperature 100.4 or higher. If you have a fever that is 105.0 or higher, that is a dangerous level and needs medical attention in the office or in the ER!    Can take both of these together... IBUPROFEN - 400-800 mg every 6 - 8 hours.  Ibuprofen and similar medications can cause problems for people with heart disease or history of stomach ulcers, check with Dr A first if you're concerned. Lower doses are usually safe and effective.  TYLENOL (ACETAMINOPHEN) - 500-1000 mg every 6 hours. It won't cause problems with heart or stomach.     REMEMBER - THE MOST IMPORTANT THINGS YOU CAN DO TO AVOID CATCHING OR SPREADING ILLNESS INCLUDE:  . COVER YOUR COUGH WITH YOUR ARM, NOT WITH YOUR HANDS!  . DISINFECT COMMONLY USED SURFACES (SUCH AS TELEPHONES & DOORKNOBS) WHEN YOU OR SOMEONE CLOSE TO YOU IS FEELING SICK!  . BE SURE VACCINES ARE UP TO DATE - GET A FLU SHOT EVERY YEAR! . GOOD NUTRITION AND HEALTHY LIFESTYLE WILL HELP YOUR IMMUNE SYSTEM YEAR-ROUND! . AND ABOVE ALL - WASH YOUR HANDS! 

## 2015-03-27 NOTE — Progress Notes (Signed)
HPI: Tracey Patterson is a 34 y.o. female who presents to [facility]  today for chief complaint of:  Chief Complaint  Patient presents with  . Cough   . Location: generalized  . Quality: pressure . Severity: moderate . Duration: 3-4 days . Context: yes sick contacts, no recent travel . Modifying factors: has tried the following OTC medications: Advil  with relief last dose this morning around 8:00, also uses Zyrtec daily . Assoc signs/symptoms: 101 yesterday fever/chills, no productive cough, Yes  body aches, Yes  GI upset   Past medical, social and family history reviewed: Past Medical History  Diagnosis Date  . Asthma   . History of TMJ disorder   . Anxiety    Past Surgical History  Procedure Laterality Date  . Knee arthroscopy  1998    left  . Tonsillectomy    . Laparoscopic tubal ligation  02/28/2012    Procedure: LAPAROSCOPIC TUBAL LIGATION;  Surgeon: Cyril Mourning, MD;  Location: [facility];  Service: Gynecology;  Laterality: Bilateral;  . Back surgery  December 2015   Social History  Substance Use Topics  . Smoking status: Never Smoker   . Smokeless tobacco: Never Used  . Alcohol Use: Yes   Family History  Problem Relation Age of Onset  . Hypertension Mother   . Depression Mother   . Hypertension Father     Current Outpatient Prescriptions  Medication Sig Dispense Refill  . albuterol (PROVENTIL HFA;VENTOLIN HFA) 108 (90 BASE) MCG/ACT inhaler Inhale 2 puffs into the lungs every 4 (four) hours as needed for wheezing (cough, shortness of breath or wheezing.). 1 Inhaler 1  . Azelastine-Fluticasone 137-50 MCG/ACT SUSP Place 1 spray into the nose 2 (two) times daily as needed.    . cetirizine (ZYRTEC) 10 MG tablet Take 1 tablet (10 mg total) by mouth daily. 30 tablet 3  . clonazePAM (KLONOPIN) 0.5 MG tablet Half to full tab daily as needed for anxiety. 30 tablet 1  . pantoprazole (PROTONIX) 40 MG tablet Take 1 tablet (40 mg total)  by mouth daily. 90 tablet 3  . Vitamin D, Ergocalciferol, (DRISDOL) 50000 UNITS CAPS capsule Take 1 capsule (50,000 Units total) by mouth every 7 (seven) days. Recheck Vitamin D in 3 Months 12 capsule 0  . zolpidem (AMBIEN) 10 MG tablet TAKE 1/2 TO 1 TABLET BY MOUTH AT NIGHT FOR UP TO 8 HOURS OF SLEEP 30 tablet 1   No current facility-administered medications for this visit.   Allergies  Allergen Reactions  . Crab [Shellfish Allergy] Other (See Comments)    Allergist advises against eating crab.  . Sulfa Antibiotics Hives  . Zoloft [Sertraline Hcl]     Hair loss  . Other Rash    Dermabond, steri strips      Review of Systems: CONSTITUTIONAL: (+) fever/chills HEAD/EYES/EARS/NOSE/THROAT: yes headache, no vision change or hearing change, yes sore throat CARDIAC: No chest pain/pressure/palpitations, no orthopnea RESPIRATORY: yes cough, no shortness of breath GASTROINTESTINAL: yes nausea, no vomiting, no abdominal pain/blood in stool/diarrhea/constipation MUSCULOSKELETAL: no myalgia/arthralgia   Exam:  BP 109/68 mmHg  Pulse 90  Temp(Src) 97.4 F (36.3 C) (Oral)  Ht 5' 6"  (1.676 m)  Wt 188 lb (85.276 kg)  BMI 30.36 kg/m2 Constitutional: VSS, see above. General Appearance: alert, well-developed, well-nourished, NAD Eyes: Normal lids and conjunctive, non-icteric sclera, PERRLA Ears, Nose, Mouth, Throat: Normal external inspection ears/nares/mouth/lips/gums, normal TM, MMM; posterior pharynx with erythema, without exudate Neck: No masses, trachea midline. No  thyroid enlargement/tenderness/mass appreciated, normal lymph nodes Respiratory: Normal respiratory effort. No  wheeze/rhonchi/rales Cardiovascular: S1/S2 normal, no murmur/rub/gallop auscultated. RRR. No carotid bruit or JVD. No lower extremity edema.    ASSESSMENT/PLAN: Patient states her main concern is that she needs a work note for today, advised viral illness, supportive care discussed with the patient, return if no  improvement. Would also use albuterol as needed and nasal spray as well.  Flu-like symptoms - Plan: benzonatate (TESSALON) 200 MG capsule    Return if symptoms worsen or fail to improve.

## 2015-04-15 MED FILL — FLUCONAZOLE 150 MG TABLET: 150 | 4 days supply | Qty: 2 | Fill #0

## 2015-04-16 MED FILL — METHOCARBAMOL 750 MG TABLET: 750 | 3 days supply | Qty: 20 | Fill #0

## 2015-04-29 MED FILL — clonazePAM 0.5 MG TABS: 0.5 | 30 days supply | Qty: 30 | Fill #1

## 2015-04-29 MED FILL — ZOLPIDEM TARTRATE 10 MG TAB: 10 | 30 days supply | Qty: 30 | Fill #1

## 2015-06-25 ENCOUNTER — Ambulatory Visit (INDEPENDENT_AMBULATORY_CARE_PROVIDER_SITE_OTHER): Payer: 59 | Admitting: Family Medicine

## 2015-06-25 ENCOUNTER — Encounter: Payer: Self-pay | Admitting: Family Medicine

## 2015-06-25 VITALS — BP 113/78 | HR 73 | Temp 98.0°F | Wt 178.0 lb

## 2015-06-25 DIAGNOSIS — F329 Major depressive disorder, single episode, unspecified: Secondary | ICD-10-CM

## 2015-06-25 DIAGNOSIS — F32A Depression, unspecified: Secondary | ICD-10-CM

## 2015-06-25 DIAGNOSIS — K219 Gastro-esophageal reflux disease without esophagitis: Secondary | ICD-10-CM | POA: Diagnosis not present

## 2015-06-25 DIAGNOSIS — F418 Other specified anxiety disorders: Secondary | ICD-10-CM

## 2015-06-25 DIAGNOSIS — J329 Chronic sinusitis, unspecified: Secondary | ICD-10-CM

## 2015-06-25 DIAGNOSIS — G4700 Insomnia, unspecified: Secondary | ICD-10-CM

## 2015-06-25 DIAGNOSIS — B9689 Other specified bacterial agents as the cause of diseases classified elsewhere: Secondary | ICD-10-CM

## 2015-06-25 DIAGNOSIS — E559 Vitamin D deficiency, unspecified: Secondary | ICD-10-CM | POA: Diagnosis not present

## 2015-06-25 DIAGNOSIS — A499 Bacterial infection, unspecified: Secondary | ICD-10-CM

## 2015-06-25 DIAGNOSIS — F419 Anxiety disorder, unspecified: Secondary | ICD-10-CM

## 2015-06-25 MED ORDER — AMOXICILLIN-POT CLAVULANATE 500-125 MG PO TABS: ORAL_TABLET | ORAL | Status: AC

## 2015-06-25 MED ORDER — ZOLPIDEM TARTRATE 10 MG PO TABS: ORAL_TABLET | ORAL | Status: DC

## 2015-06-25 MED ORDER — HYDROCODONE-HOMATROPINE 5-1.5 MG/5ML PO SYRP: 5.0000 mL | ORAL_SOLUTION | Freq: Three times a day (TID) | ORAL | Status: DC | PRN

## 2015-06-25 MED FILL — AMOX-CLAV 500-125 MG TABLET: 500-125 | 10 days supply | Qty: 30 | Fill #0

## 2015-06-25 NOTE — Progress Notes (Signed)
CC: Tracey Patterson is a 35 y.o. female is here for URI   Subjective: HPI:  1 week of nasal congestion, facial pressure, nonproductive cough that's been present on a daily basis. Slowly worsening. It's gotten to the point where she can't stay asleep for more than a few minutes. Children were sick with the same symptoms but now they're better. She denies fevers, chills nor myalgias. She denies shortness of breath. No benefit from Robitussin, NyQuil or DayQuil.  Follow GERD: A few days after starting Protonix she had complete resolution of all of her reflux symptoms. She takes this on a daily basis with no known side effects.  Follow-up anxiety and depression: She tells me that depression does not seem to be bothering her much but she still gets anxious to the point where she needs to take clonazepam a few times a week. It seems to provide a complete resolution of her anxiety for a few days. She denies known side effects.  Follow-up insomnia: She is requesting a refill on Ambien. She is only having to take it 2-3 times a week depending on whenever she transfers from third shift to a shift. She is on a consistent schedule she does not need to take this medication. It helps her with falling asleep and staying asleep.   Review Of Systems Outlined In HPI  Past Medical History  Diagnosis Date  . Asthma   . History of TMJ disorder   . Anxiety     Past Surgical History  Procedure Laterality Date  . Knee arthroscopy  1998    left  . Tonsillectomy    . Laparoscopic tubal ligation  02/28/2012    Procedure: LAPAROSCOPIC TUBAL LIGATION;  Surgeon: Cyril Mourning, MD;  Location: [facility];  Service: Gynecology;  Laterality: Bilateral;  . Back surgery  December 2015   Family History  Problem Relation Age of Onset  . Hypertension Mother   . Depression Mother   . Hypertension Father     Social History   Social History  . Marital Status: Married    Spouse Name: N/A  . Number of Children: N/A   . Years of Education: N/A   Occupational History  . Not on file.   Social History Main Topics  . Smoking status: Never Smoker   . Smokeless tobacco: Never Used  . Alcohol Use: Yes  . Drug Use: No  . Sexual Activity: Not Currently   Other Topics Concern  . 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 Conjunctivae clear.  External ears unremarkable, canals clear with intact TMs with appropriate landmarks.  Middle ear appears open without effusion. Pink inferior turbinates.  Moist mucous membranes, pharynx without inflammation  nor lesions.  Neck supple without palpable lymphadenopathy nor abnormal masses. Lungs: Clear to auscultation bilaterally, no wheezing/ronchi/rales.  Comfortable work of breathing. Good air movement. Frequent coughing Extremities: No peripheral edema.  Strong peripheral pulses.  Mental Status: No depression, anxiety, nor agitation. Skin: Warm and dry.  Assessment & Plan: Gera was seen today for uri.  Diagnoses and all orders for this visit:  Bacterial sinusitis -     amoxicillin-clavulanate (AUGMENTIN) 500-125 MG tablet; Take one by mouth every 8 hours for ten total days. -     HYDROcodone-homatropine (HYCODAN) 5-1.5 MG/5ML syrup; Take 5 mLs by mouth every 8 (eight) hours as needed for cough.  Anxiety and depression  Gastroesophageal reflux  disease, esophagitis presence not specified  Vitamin D deficiency  Insomnia  Other orders -     zolpidem (AMBIEN) 10 MG tablet; TAKE 1/2 TO 1 TABLET BY MOUTH AT NIGHT FOR UP TO 8 HOURS OF SLEEP   Bacterial sinusitis: Start Augmentin and consider nasal saline washes. May start Hycodan to help with sleep. Anxiety and depression: Controlled with clonazepam GERD: Resolved and controlled with tonic Insomnia: Controlled with occasionally uses Ambien Vitamin D deficiency: She is now using a daily multivitamin containing vitamin D.   Return in about 3 months (around 09/25/2015), or if symptoms worsen or fail to improve.

## 2015-07-08 ENCOUNTER — Encounter: Payer: Self-pay | Admitting: Family Medicine

## 2015-07-08 MED ORDER — FLUCONAZOLE 150 MG PO TABS: ORAL_TABLET | ORAL | Status: AC

## 2015-07-08 MED FILL — FLUCONAZOLE 150 MG TABLET: 150 | 6 days supply | Qty: 2 | Fill #0

## 2015-07-17 MED FILL — PANTOPRAZOLE SOD DR 40 MG T: 40 | 90 days supply | Qty: 90 | Fill #1

## 2015-08-03 ENCOUNTER — Emergency Department
Admission: EM | Admit: 2015-08-03 | Discharge: 2015-08-03 | Disposition: A | Discharge status: Home / Self Care | Payer: 59 | Source: Home / Self Care | Attending: Family Medicine | Admitting: Family Medicine

## 2015-08-03 ENCOUNTER — Encounter: Payer: Self-pay | Admitting: Emergency Medicine

## 2015-08-03 DIAGNOSIS — J069 Acute upper respiratory infection, unspecified: Secondary | ICD-10-CM

## 2015-08-03 DIAGNOSIS — J4521 Mild intermittent asthma with (acute) exacerbation: Secondary | ICD-10-CM | POA: Diagnosis not present

## 2015-08-03 MED ORDER — AZITHROMYCIN 250 MG PO TABS: 250.0000 mg | ORAL_TABLET | Freq: Every day | ORAL | Status: DC

## 2015-08-03 MED ORDER — IPRATROPIUM-ALBUTEROL 0.5-2.5 (3) MG/3ML IN SOLN
3.0000 mL | Freq: Once | RESPIRATORY_TRACT | Status: AC
  Administered 2015-08-03: 3 mL via RESPIRATORY_TRACT

## 2015-08-03 MED ORDER — METHYLPREDNISOLONE SODIUM SUCC 40 MG IJ SOLR
80.0000 mg | Freq: Once | INTRAMUSCULAR | Status: AC
  Administered 2015-08-03: 80 mg via INTRAMUSCULAR

## 2015-08-03 MED ORDER — IPRATROPIUM-ALBUTEROL 0.5-2.5 (3) MG/3ML IN SOLN: 3.0000 mL | Freq: Four times a day (QID) | RESPIRATORY_TRACT | Status: DC

## 2015-08-03 MED ORDER — BENZONATATE 100 MG PO CAPS: 100.0000 mg | ORAL_CAPSULE | Freq: Three times a day (TID) | ORAL | Status: DC

## 2015-08-03 MED ORDER — PREDNISONE 20 MG PO TABS: ORAL_TABLET | ORAL | Status: DC

## 2015-08-03 NOTE — ED Provider Notes (Signed)
CSN: [id number]     Arrival date & time 08/03/15  1350 History   First MD Initiated Contact with Patient 08/03/15 1421     Chief Complaint  Patient presents with  . Cough  . Wheezing   (Consider location/radiation/quality/duration/timing/severity/associated sxs/prior Treatment) HPI The pt is a 35yo female presenting to [facility] with c/o 5 days of worsening cough, congestion, chest tightness and wheeze.  She has a hx of asthma and has used her inhaler 4 times in the last 24 hours but no relief.  She also reports having a hoarse voice, now nearly no voice.  Denies fever, n/v/d. No sick contacts or recent travel. She notes she typically gets a steroid and antibiotic to help with her breathing.   Past Medical History  Diagnosis Date  . Asthma   . History of TMJ disorder   . Anxiety    Past Surgical History  Procedure Laterality Date  . Knee arthroscopy  1998    left  . Tonsillectomy    . Laparoscopic tubal ligation  02/28/2012    Procedure: LAPAROSCOPIC TUBAL LIGATION;  Surgeon: Cyril Mourning, MD;  Location: [facility];  Service: Gynecology;  Laterality: Bilateral;  . Back surgery  December 2015   Family History  Problem Relation Age of Onset  . Hypertension Mother   . Depression Mother   . Hypertension Father    Social History  Substance Use Topics  . Smoking status: Never Smoker   . Smokeless tobacco: Never Used  . Alcohol Use: Yes   OB History    No data available     Review of Systems  Constitutional: Negative for fever and chills.  HENT: Positive for congestion, rhinorrhea and voice change. Negative for ear pain, sore throat and trouble swallowing.   Respiratory: Positive for cough, chest tightness, shortness of breath and wheezing.   Cardiovascular: Negative for chest pain and palpitations.  Gastrointestinal: Negative for nausea, vomiting, abdominal pain and diarrhea.  Musculoskeletal: Negative for myalgias, back pain and arthralgias.  Skin: Negative for rash.     Allergies  Crab; Sulfa antibiotics; Zoloft; and Other  Home Medications   Prior to Admission medications   Medication Sig Start Date End Date Taking? Authorizing Provider  albuterol (PROVENTIL HFA;VENTOLIN HFA) 108 (90 BASE) MCG/ACT inhaler Inhale 2 puffs into the lungs every 4 (four) hours as needed for wheezing (cough, shortness of breath or wheezing.). 02/26/15   Marcial Pacas, DO  Azelastine-Fluticasone 137-50 MCG/ACT SUSP Place 1 spray into the nose 2 (two) times daily as needed.    Historical Provider, MD  azithromycin (ZITHROMAX) 250 MG tablet Take 1 tablet (250 mg total) by mouth daily. Take first 2 tablets together, then 1 every day until finished. 08/03/15   Noland Fordyce, PA-C  benzonatate (TESSALON) 100 MG capsule Take 1-2 capsules (100-200 mg total) by mouth every 8 (eight) hours. 08/03/15   Noland Fordyce, PA-C  cetirizine (ZYRTEC) 10 MG tablet Take 1 tablet (10 mg total) by mouth daily. 09/20/13   Marcial Pacas, DO  clonazePAM (KLONOPIN) 0.5 MG tablet Half to full tab daily as needed for anxiety. 02/26/15 02/26/16  Sean Hommel, DO  HYDROcodone-homatropine (HYCODAN) 5-1.5 MG/5ML syrup Take 5 mLs by mouth every 8 (eight) hours as needed for cough. 06/25/15   Sean Hommel, DO  pantoprazole (PROTONIX) 40 MG tablet Take 1 tablet (40 mg total) by mouth daily. 02/26/15   Sean Hommel, DO  predniSONE (DELTASONE) 20 MG tablet 3 tabs po day one, then 2 po daily x  4 days 08/03/15   Noland Fordyce, PA-C  Vitamin D, Ergocalciferol, (DRISDOL) 50000 UNITS CAPS capsule Take 1 capsule (50,000 Units total) by mouth every 7 (seven) days. Recheck Vitamin D in 3 Months 09/01/14   Marcial Pacas, DO  zolpidem (AMBIEN) 10 MG tablet TAKE 1/2 TO 1 TABLET BY MOUTH AT NIGHT FOR UP TO 8 HOURS OF SLEEP 06/25/15   Marcial Pacas, DO   Meds Ordered and Administered this Visit   Medications  methylPREDNISolone sodium succinate (SOLU-MEDROL) 40 mg/mL injection 80 mg (80 mg Intramuscular Given 08/03/15 1451)  ipratropium-albuterol  (DUONEB) 0.5-2.5 (3) MG/3ML nebulizer solution 3 mL (3 mLs Nebulization Given 08/03/15 1445)    BP 114/80 mmHg  Pulse 110  Temp(Src) 98 F (36.7 C) (Oral)  Resp 18  Ht 5' 7"  (1.702 m)  Wt 177 lb (80.287 kg)  BMI 27.72 kg/m2  SpO2 95%  LMP 07/14/2015 No data found.   Physical Exam  Constitutional: She appears well-developed and well-nourished. No distress.  HENT:  Head: Normocephalic and atraumatic.  Right Ear: Tympanic membrane normal.  Left Ear: Tympanic membrane normal.  Nose: Nose normal.  Mouth/Throat: Uvula is midline and mucous membranes are normal. Posterior oropharyngeal erythema present. No oropharyngeal exudate, posterior oropharyngeal edema or tonsillar abscesses.  Eyes: Conjunctivae are normal. No scleral icterus.  Neck: Normal range of motion. Neck supple.  Cardiovascular: Normal rate, regular rhythm and normal heart sounds.   Pulmonary/Chest: Effort normal. No stridor. No respiratory distress. She has wheezes. She has no rales.  Diffuse wheeze. Faint rhonchi throughout. Hoarse voice but able to speak in full sentences.   Abdominal: Soft. She exhibits no distension. There is no tenderness.  Musculoskeletal: Normal range of motion.  Lymphadenopathy:    She has no cervical adenopathy.  Neurological: She is alert.  Skin: Skin is warm and dry. She is not diaphoretic.  Nursing note and vitals reviewed.    ED Course  Procedures (including critical care time)  Labs Review Labs Reviewed - No data to display  Imaging Review No results found.    MDM   1. Asthma with acute exacerbation, mild intermittent   2. Acute upper respiratory infection     Pt c/o worsening URI symptoms.  Wheeze on exam. O2 Sat 95% on RA.  Solumedrol 26m IM and duoneb given in UC. Pt states she does feel somewhat improved.  Wheeze nearly resolved, faint expiratory wheeze in upper lungs remains.  Will cover for atypical bacteria. Rx: Azithromycin, prednisone, and tessalon.    Advised pt to use acetaminophen and ibuprofen as needed for fever and pain. Encouraged rest and fluids. F/u with PCP in 1 week if not improving, sooner if worsening. Pt verbalized understanding and agreement with tx plan.     ENoland Fordyce PA-C 08/03/15 1609

## 2015-08-03 NOTE — Discharge Instructions (Signed)
You may take 400-670m Ibuprofen (Motrin) every 6-8 hours for fever and pain  Alternate with Tylenol  You may take 5076mTylenol every 4-6 hours as needed for fever and pain  Follow-up with your primary care provider next week for recheck of symptoms if not improving.  Be sure to drink plenty of fluids and rest, at least 8hrs of sleep a night, preferably more while you are sick. Return urgent care or go to closest ER if you cannot keep down fluids/signs of dehydration, fever not reducing with Tylenol, difficulty breathing/wheezing, stiff neck, worsening condition, or other concerns (see below)  Please take antibiotics as prescribed and be sure to complete entire course even if you start to feel better to ensure infection does not come back.   Cool Mist Vaporizers Vaporizers may help relieve the symptoms of a cough and cold. They add moisture to the air, which helps mucus to become thinner and less sticky. This makes it easier to breathe and cough up secretions. Cool mist vaporizers do not cause serious burns like hot mist vaporizers, which may also be called steamers or humidifiers. Vaporizers have not been proven to help with colds. You should not use a vaporizer if you are allergic to mold. HOME CARE INSTRUCTIONS  Follow the package instructions for the vaporizer.  Do not use anything other than distilled water in the vaporizer.  Do not run the vaporizer all of the time. This can cause mold or bacteria to grow in the vaporizer.  Clean the vaporizer after each time it is used.  Clean and dry the vaporizer well before storing it.  Stop using the vaporizer if worsening respiratory symptoms develop.   This information is not intended to replace advice given to you by your health care provider. Make sure you discuss any questions you have with your health care provider.   Document Released: 12/11/2003 Document Revised: 03/20/2013 Document Reviewed: 08/02/2012 Elsevier Interactive Patient  Education 2016 [place] Asthma, Acute Bronchospasm Acute bronchospasm caused by asthma is also referred to as an asthma attack. Bronchospasm means your air passages become narrowed. The narrowing is caused by inflammation and tightening of the muscles in the air tubes (bronchi) in your lungs. This can make it hard to breathe or cause you to wheeze and cough. CAUSES Possible triggers are:  Animal dander from the skin, hair, or feathers of animals.  Dust mites contained in house dust.  Cockroaches.  Pollen from trees or grass.  Mold.  Cigarette or tobacco smoke.  Air pollutants such as dust, household cleaners, hair sprays, aerosol sprays, paint fumes, strong chemicals, or strong odors.  Cold air or weather changes. Cold air may trigger inflammation. Winds increase molds and pollens in the air.  Strong emotions such as crying or laughing hard.  Stress.  Certain medicines such as aspirin or beta-blockers.  Sulfites in foods and drinks, such as dried fruits and wine.  Infections or inflammatory conditions, such as a flu, cold, or inflammation of the nasal membranes (rhinitis).  Gastroesophageal reflux disease (GERD). GERD is a condition where stomach acid backs up into your esophagus.  Exercise or strenuous activity. SIGNS AND SYMPTOMS   Wheezing.  Excessive coughing, particularly at night.  Chest tightness.  Shortness of breath. DIAGNOSIS  Your health care provider will ask you about your medical history and perform a physical exam. A chest X-ray or blood testing may be performed to look for other causes of your symptoms or other conditions that may have triggered your  asthma attack. TREATMENT  Treatment is aimed at reducing inflammation and opening up the airways in your lungs. Most asthma attacks are treated with inhaled medicines. These include quick relief or rescue medicines (such as bronchodilators) and controller medicines (such as inhaled  corticosteroids). These medicines are sometimes given through an inhaler or a nebulizer. Systemic steroid medicine taken by mouth or given through an IV tube also can be used to reduce the inflammation when an attack is moderate or severe. Antibiotic medicines are only used if a bacterial infection is present.  HOME CARE INSTRUCTIONS   Rest.  Drink plenty of liquids. This helps the mucus to remain thin and be easily coughed up. Only use caffeine in moderation and do not use alcohol until you have recovered from your illness.  Do not smoke. Avoid being exposed to secondhand smoke.  You play a critical role in keeping yourself in good health. Avoid exposure to things that cause you to wheeze or to have breathing problems.  Keep your medicines up-to-date and available. Carefully follow your health care provider's treatment plan.  Take your medicine exactly as prescribed.  When pollen or pollution is bad, keep windows closed and use an air conditioner or go to places with air conditioning.  Asthma requires careful medical care. See your health care provider for a follow-up as advised. If you are more than [redacted] weeks pregnant and you were prescribed any new medicines, let your obstetrician know about the visit and how you are doing. Follow up with your health care provider as directed.  After you have recovered from your asthma attack, make an appointment with your outpatient doctor to talk about ways to reduce the likelihood of future attacks. If you do not have a doctor who manages your asthma, make an appointment with a primary care doctor to discuss your asthma. SEEK IMMEDIATE MEDICAL CARE IF:   You are getting worse.  You have trouble breathing. If severe, call your local emergency services (911 in the [place]  You develop chest pain or discomfort.  You are vomiting.  You are not able to keep fluids down.  You are coughing up yellow, green, brown, or bloody sputum.  You have a fever  and your symptoms suddenly get worse.  You have trouble swallowing. MAKE SURE YOU:   Understand these instructions.  Will watch your condition.  Will get help right away if you are not doing well or get worse.   This information is not intended to replace advice given to you by your health care provider. Make sure you discuss any questions you have with your health care provider.   Document Released: 06/30/2006 Document Revised: 03/20/2013 Document Reviewed: 09/20/2012 Elsevier Interactive Patient Education [employer].

## 2015-08-03 NOTE — ED Notes (Signed)
Reports onset of cough and wheezing 5 days ago; has used her inhaler x 4 in last 24 hours without relief of symptoms; also has lost her voice.

## 2015-08-04 ENCOUNTER — Telehealth: Payer: Self-pay | Admitting: *Deleted

## 2015-08-06 ENCOUNTER — Encounter: Payer: Self-pay | Admitting: Family Medicine

## 2015-08-06 ENCOUNTER — Ambulatory Visit (INDEPENDENT_AMBULATORY_CARE_PROVIDER_SITE_OTHER): Payer: 59 | Admitting: Family Medicine

## 2015-08-06 VITALS — BP 121/83 | HR 83 | Temp 97.9°F | Wt 177.0 lb

## 2015-08-06 DIAGNOSIS — J441 Chronic obstructive pulmonary disease with (acute) exacerbation: Secondary | ICD-10-CM | POA: Diagnosis not present

## 2015-08-06 DIAGNOSIS — J45901 Unspecified asthma with (acute) exacerbation: Secondary | ICD-10-CM

## 2015-08-06 MED ORDER — PREDNISONE 20 MG PO TABS: ORAL_TABLET | ORAL | Status: AC

## 2015-08-06 MED ORDER — METHYLPREDNISOLONE SODIUM SUCC 125 MG IJ SOLR
125.0000 mg | Freq: Once | INTRAMUSCULAR | Status: AC
  Administered 2015-08-06: 125 mg via INTRAMUSCULAR

## 2015-08-06 MED ORDER — LEVOFLOXACIN 500 MG PO TABS: 500.0000 mg | ORAL_TABLET | Freq: Every day | ORAL | Status: DC

## 2015-08-06 MED FILL — predniSONE 20 MG TABS: 20 | 13 days supply | Qty: 20 | Fill #0

## 2015-08-06 MED FILL — levoFLOXacin 500 MG TABS: 500 | 7 days supply | Qty: 7 | Fill #0

## 2015-08-06 NOTE — Progress Notes (Signed)
CC: Tracey Patterson is a 35 y.o. female is here for Cough   Subjective: HPI:  Ever since Friday she's felt like her asthma has flared up. This occurred hours after she was exposed to cleaning solution and solvents. Symptoms include productive cough, wheezing and shortness of breath with rest. Symptoms have not gotten better since he received Solu-Medrol, a moderate dose of prednisone and azithromycin earlier this week at urgent care. She tells me she subjectively feels feverish but has no objective measurements. She denies nausea or blood in sputum. Albuterol does not seem to help and DuoNeb earlier this week wasn't much benefit either. She denies any chest pain or orthopnea.   Review Of Systems Outlined In HPI  Past Medical History  Diagnosis Date  . Asthma   . History of TMJ disorder   . Anxiety     Past Surgical History  Procedure Laterality Date  . Knee arthroscopy  1998    left  . Tonsillectomy    . Laparoscopic tubal ligation  02/28/2012    Procedure: LAPAROSCOPIC TUBAL LIGATION;  Surgeon: Cyril Mourning, MD;  Location: [facility];  Service: Gynecology;  Laterality: Bilateral;  . Back surgery  December 2015   Family History  Problem Relation Age of Onset  . Hypertension Mother   . Depression Mother   . Hypertension Father     Social History   Social History  . Marital Status: Married    Spouse Name: N/A  . Number of Children: N/A  . Years of Education: N/A   Occupational History  . Not on file.   Social History Main Topics  . Smoking status: Never Smoker   . Smokeless tobacco: Never Used  . Alcohol Use: Yes  . Drug Use: No  . Sexual Activity: Not Currently   Other Topics Concern  . Not on file   Social History Narrative   Marital Status: Married Aeronautical engineer)    Children:  Son Air cabin crew) Daughter [phone])    Pets: Dogs (1) Cats (2)    Living Situation: Lives with  husband and children    Occupation: Radiology [employer]    Education:  Bachelor's Degree in Science    Tobacco Use/Exposure:  None    Alcohol Use:  Occasional   Drug Use:  None   Diet:  Regular   Exercise:  None   Hobbies:  Reading            Objective: BP 121/83 mmHg  Pulse 83  Temp(Src) 97.9 F (36.6 C) (Oral)  Wt 177 lb (80.287 kg)  SpO2 97%  LMP 07/14/2015  General: Alert and Oriented, No Acute Distress HEENT: Pupils equal, round, reactive to light. Conjunctivae clear.  External ears unremarkable, canals clear with intact TMs with appropriate landmarks.  Middle ear appears open without effusion. Pink inferior turbinates.  Moist mucous membranes, pharynx without inflammation nor lesions.  Neck supple without palpable lymphadenopathy nor abnormal masses. Lungs: Clear to auscultation bilaterally, no wheezing/ronchi/rales.  Frequent coughing Cardiac: Regular rate and rhythm. Normal S1/S2.  No murmurs, rubs, nor gallops.   Extremities: No peripheral edema.  Strong peripheral pulses.  Mental Status: No depression, anxiety, nor agitation. Skin: Warm and dry.  Assessment & Plan: Tracey Patterson was seen today for cough.  Diagnoses and all orders for this visit:  Asthma, chronic obstructive, with acute exacerbation (HCC) -     levofloxacin (LEVAQUIN) 500 MG tablet; Take 1 tablet (500 mg total) by mouth daily. -  predniSONE (DELTASONE) 20 MG tablet; Three tabs daily days 1-3, two tabs daily days 4-6, one tab daily days 7-9, half tab daily days 10-13. -     methylPREDNISolone sodium succinate (SOLU-MEDROL) 125 mg/2 mL injection 125 mg; Inject 2 mLs (125 mg total) into the muscle once.   Anticipate she'll start to feel better late tomorrow evening or early Friday.Signs and symptoms requring emergent/urgent reevaluation were discussed with the patient. Stop current prednisone and azithromycin use instead begin above medication regimen.  Return if symptoms worsen or fail to improve.

## 2015-09-19 MED FILL — ZOLPIDEM TARTRATE 10 MG TAB: 10 | 10 days supply | Qty: 30 | Fill #0

## 2015-10-16 MED FILL — PANTOPRAZOLE SOD DR 40 MG T: 40 | 90 days supply | Qty: 90 | Fill #2

## 2015-10-22 ENCOUNTER — Encounter: Payer: Self-pay | Admitting: Family Medicine

## 2015-10-22 ENCOUNTER — Ambulatory Visit (INDEPENDENT_AMBULATORY_CARE_PROVIDER_SITE_OTHER): Payer: 59 | Admitting: Family Medicine

## 2015-10-22 VITALS — BP 123/88 | HR 74 | Temp 97.6°F | Wt 182.0 lb

## 2015-10-22 DIAGNOSIS — F32A Depression, unspecified: Secondary | ICD-10-CM

## 2015-10-22 DIAGNOSIS — F419 Anxiety disorder, unspecified: Secondary | ICD-10-CM

## 2015-10-22 DIAGNOSIS — F418 Other specified anxiety disorders: Secondary | ICD-10-CM | POA: Diagnosis not present

## 2015-10-22 DIAGNOSIS — Z0000 Encounter for general adult medical examination without abnormal findings: Secondary | ICD-10-CM | POA: Diagnosis not present

## 2015-10-22 DIAGNOSIS — F329 Major depressive disorder, single episode, unspecified: Secondary | ICD-10-CM

## 2015-10-22 DIAGNOSIS — J4521 Mild intermittent asthma with (acute) exacerbation: Secondary | ICD-10-CM | POA: Diagnosis not present

## 2015-10-22 MED ORDER — CLONAZEPAM 0.5 MG PO TABS: ORAL_TABLET | ORAL | 1 refills | Status: DC

## 2015-10-22 MED ORDER — AMOXICILLIN-POT CLAVULANATE 500-125 MG PO TABS: ORAL_TABLET | ORAL | 0 refills | Status: AC

## 2015-10-22 MED ORDER — FLUCONAZOLE 150 MG PO TABS: ORAL_TABLET | ORAL | 0 refills | Status: AC

## 2015-10-22 MED ORDER — METHYLPREDNISOLONE ACETATE 80 MG/ML IJ SUSP
80.0000 mg | Freq: Once | INTRAMUSCULAR | Status: AC
  Administered 2015-10-22: 80 mg via INTRAMUSCULAR

## 2015-10-22 MED ORDER — NORETHINDRONE 0.35 MG PO TABS: ORAL_TABLET | ORAL | 2 refills | Status: DC

## 2015-10-22 MED FILL — AMOX-CLAV 500-125 MG TABLET: 500-125 | 10 days supply | Qty: 30 | Fill #0

## 2015-10-22 MED FILL — NORETHINDRONE 0.35 MG TAB: 0.35 | 84 days supply | Qty: 84 | Fill #0

## 2015-10-22 MED FILL — FLUCONAZOLE 150 MG TABLET: 150 | 3 days supply | Qty: 2 | Fill #0

## 2015-10-22 NOTE — Progress Notes (Signed)
CC: Tracey Patterson is a 35 y.o. female is here for Annual Exam   Subjective: HPI:  Colonoscopy: No current indication Papsmear: Patient prefers to have this done at [facility] Mammogram: No current indication  Influenza Vaccine: No current indication Pneumovax: No current indication Td/Tdap: Up-to-date from 2008 Zoster: (Start 35 yo)  Requesting complete physical exam with worsening asthma symptoms over the past week. She's had some chest tightness and cough developed yesterday. Cough is nonproductive with occasional wheezing. Albuterol only temporarily helps all of the above symptoms. Denies fevers, chills or blood in sputum.   Review of Systems - General ROS: negative for - chills, fever, night sweats, weight gain or weight loss Ophthalmic ROS: negative for - decreased vision Psychological ROS: negative for - anxiety or depression ENT ROS: negative for - hearing change, nasal congestion, tinnitus or allergies Hematological and Lymphatic ROS: negative for - bleeding problems, bruising or swollen lymph nodes Breast ROS: negative Respiratory ROS: No exertional chest discomfort Cardiovascular ROS: no chest pain or dyspnea on exertion Gastrointestinal ROS: no abdominal pain, change in bowel habits, or black or bloody stools Genito-Urinary ROS: negative for - genital discharge, genital ulcers, incontinence or abnormal bleeding from genitals Musculoskeletal ROS: negative for - joint pain or muscle pain Neurological ROS: negative for - headaches or memory loss Dermatological ROS: negative for lumps, mole changes, rash and skin lesion changes  Past Medical History:  Diagnosis Date  . Anxiety   . Asthma   . History of TMJ disorder     Past Surgical History:  Procedure Laterality Date  . BACK SURGERY  December 2015  . KNEE ARTHROSCOPY  1998   left  . LAPAROSCOPIC TUBAL LIGATION  02/28/2012   Procedure: LAPAROSCOPIC TUBAL LIGATION;  Surgeon: Cyril Mourning, MD;  Location: [facility];  Service: Gynecology;  Laterality: Bilateral;  . TONSILLECTOMY     Family History  Problem Relation Age of Onset  . Hypertension Mother   . Depression Mother   . Hypertension Father     Social History   Social History  . Marital status: Married    Spouse name: N/A  . Number of children: N/A  . Years of education: N/A   Occupational History  . Not on file.   Social History Main Topics  . Smoking status: Never Smoker  . Smokeless tobacco: Never Used  . Alcohol use Yes  . Drug use: No  . Sexual activity: Not Currently   Other Topics Concern  . 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  No nasal discharge, hearing grossly intact, TMs with good landmarks bilaterally with no middle ear abnormalities, posterior pharynx clear without  oral lesions. Neck: Supple, trachea midline, no cervical nor supraclavicular adenopathy. Pulmonary: Clear to auscultation bilaterally without wheezing, rhonchi, nor rales. Cardiac: Regular rate and rhythm.  No murmurs, rubs, nor gallops. No peripheral edema.  2+ peripheral pulses bilaterally. Abdomen: Bowel sounds normal.  No masses.  Non-tender without rebound.  Negative Murphy's sign. MSK: Grossly intact, no signs of weakness.  Full strength throughout upper and lower extremities.  Full ROM in  upper and lower extremities.  No midline spinal tenderness. Neuro: Gait unremarkable, CN II-XII grossly intact.  C5-C6 Reflex 2/4 Bilaterally, L4 Reflex 2/4 Bilaterally.  Cerebellar function intact. Skin: No rashes. Psych: Alert and oriented to person/place/time.  Thought process normal. No anxiety/depression.   Assessment & Plan: Tracey Patterson was seen today for annual exam.  Diagnoses and all orders for this visit:  Annual physical exam -     Lipid panel -     COMPLETE METABOLIC PANEL WITH GFR -     CBC  Anxiety and depression -     Discontinue: clonazePAM (KLONOPIN) 0.5 MG tablet; Half to full tab daily as needed for anxiety. -     clonazePAM (KLONOPIN) 0.5 MG tablet; Half to full tab daily as needed for anxiety.  Asthma, mild intermittent, with acute exacerbation -     methylPREDNISolone acetate (DEPO-MEDROL) injection 80 mg; Inject 1 mL (80 mg total) into the muscle once.  Other orders -     norethindrone (CAMILA) 0.35 MG tablet; One by mouth daily. -     amoxicillin-clavulanate (AUGMENTIN) 500-125 MG tablet; Take one by mouth every 8 hours for ten total days. -     fluconazole (DIFLUCAN) 150 MG tablet; Take one tab, may take second tab if no improvement after 72 hours.  Healthy lifestyle interventions including but not limited to regular exercise, a healthy low fat diet, moderation of salt intake, the dangers of tobacco/alcohol/recreational drug use, nutrition supplementation, and accident avoidance were discussed with the patient and a handout was provided for future reference. She is requesting a birth control pill that will not cause worsening migraines that might also help control her cycle. We'll try Camila Asthma exacerbation: She received Depo-Medrol along with a prescription for Augmentin.Signs and symptoms requring emergent/urgent reevaluation were discussed with the patient. She's also requesting a refill on clonazepam which seems reasonable.  Discussed with this patient  that I will be resigning from my position here with [employer] [employer] in September in order to stay with my family who will be moving to [facility]. I let him know about the providers that are still accepting patients and I feel that this individual will be under great care if he/she stays here with [facility].   Return in about 3 months (around 01/22/2016) for Dr. Arvilla Meres.

## 2015-12-04 MED FILL — clonazePAM 0.5 MG TABS: 0.5 | 90 days supply | Qty: 90 | Fill #0

## 2015-12-04 MED FILL — ZOLPIDEM TARTRATE 10 MG TAB: 10 | 30 days supply | Qty: 30 | Fill #1

## 2015-12-31 DIAGNOSIS — Z0100 Encounter for examination of eyes and vision without abnormal findings: Secondary | ICD-10-CM | POA: Diagnosis not present

## 2016-01-15 MED FILL — NORETHINDRONE 0.35 MG TAB: 0.35 | 84 days supply | Qty: 84 | Fill #1

## 2016-01-15 MED FILL — PANTOPRAZOLE SOD DR 40 MG T: 40 | 90 days supply | Qty: 90 | Fill #3

## 2016-02-10 ENCOUNTER — Telehealth: Payer: 59 | Admitting: Family

## 2016-02-10 DIAGNOSIS — J019 Acute sinusitis, unspecified: Secondary | ICD-10-CM | POA: Diagnosis not present

## 2016-02-10 DIAGNOSIS — B9689 Other specified bacterial agents as the cause of diseases classified elsewhere: Secondary | ICD-10-CM | POA: Diagnosis not present

## 2016-02-10 MED ORDER — AMOXICILLIN-POT CLAVULANATE 875-125 MG PO TABS: 1.0000 | ORAL_TABLET | Freq: Two times a day (BID) | ORAL | 0 refills | Status: DC

## 2016-02-10 MED ORDER — FLUTICASONE PROPIONATE 50 MCG/ACT NA SUSP: 2.0000 | Freq: Every day | NASAL | 6 refills | Status: DC

## 2016-02-10 MED FILL — FLUTICASONE PROP 50 MCG SPR: 50 | 30 days supply | Qty: 16 | Fill #0

## 2016-02-10 MED FILL — AMOX-CLAV 875-125 MG TABLET: 875-125 | 7 days supply | Qty: 14 | Fill #0

## 2016-02-10 NOTE — Progress Notes (Signed)
We are sorry that you are not feeling well.  Here is how we plan to help!  Based on what you have shared with me it looks like you have sinusitis.  Sinusitis is inflammation and infection in the sinus cavities of the head.  Based on your presentation I believe you most likely have Acute Bacterial Sinusitis.  This is an infection caused by bacteria and is treated with antibiotics. I have prescribed Augmentin 817m/125mg one tablet twice daily with food, for 7 days. You may use an oral decongestant such as Mucinex D or if you have glaucoma or high blood pressure use plain Mucinex. Saline nasal spray help and can safely be used as often as needed for congestion.  If you develop worsening sinus pain, fever or notice severe headache and vision changes, or if symptoms are not better after completion of antibiotic, please schedule an appointment with a health care provider.  Flonase 2 sprays in each nostril once a day.  Sinus infections are not as easily transmitted as other respiratory infection, however we still recommend that you avoid close contact with loved ones, especially the very young and elderly.  Remember to wash your hands thoroughly throughout the day as this is the number one way to prevent the spread of infection!  Home Care:  Only take medications as instructed by your medical team.  Complete the entire course of an antibiotic.  Do not take these medications with alcohol.  A steam or ultrasonic humidifier can help congestion.  You can place a towel over your head and breathe in the steam from hot water coming from a faucet.  Avoid close contacts especially the very young and the elderly.  Cover your mouth when you cough or sneeze.  Always remember to wash your hands.  Get Help Right Away If:  You develop worsening fever or sinus pain.  You develop a severe head ache or visual changes.  Your symptoms persist after you have completed your treatment plan.  Make sure  you  Understand these instructions.  Will watch your condition.  Will get help right away if you are not doing well or get worse.  Your e-visit answers were reviewed by a board certified advanced clinical practitioner to complete your personal care plan.  Depending on the condition, your plan could have included both over the counter or prescription medications.  If there is a problem please reply  once you have received a response from your provider.  Your safety is important to [place]  If you have drug allergies check your prescription carefully.    You can use MyChart to ask questions about today's visit, request a non-urgent call back, or ask for a work or school excuse for 24 hours related to this e-Visit. If it has been greater than 24 hours you will need to follow up with your provider, or enter a new e-Visit to address those concerns.  You will get an e-mail in the next two days asking about your experience.  I hope that your e-visit has been valuable and will speed your recovery. Thank you for using e-visits.

## 2016-02-17 ENCOUNTER — Encounter: Payer: Self-pay | Admitting: Osteopathic Medicine

## 2016-02-17 ENCOUNTER — Ambulatory Visit (INDEPENDENT_AMBULATORY_CARE_PROVIDER_SITE_OTHER): Payer: 59 | Admitting: Osteopathic Medicine

## 2016-02-17 VITALS — BP 116/81 | HR 83 | Ht 65.5 in | Wt 191.0 lb

## 2016-02-17 DIAGNOSIS — M6740 Ganglion, unspecified site: Secondary | ICD-10-CM

## 2016-02-17 DIAGNOSIS — F411 Generalized anxiety disorder: Secondary | ICD-10-CM | POA: Diagnosis not present

## 2016-02-17 DIAGNOSIS — J329 Chronic sinusitis, unspecified: Secondary | ICD-10-CM | POA: Diagnosis not present

## 2016-02-17 MED ORDER — LEVOFLOXACIN 500 MG PO TABS: 500.0000 mg | ORAL_TABLET | Freq: Every day | ORAL | 0 refills | Status: DC

## 2016-02-17 MED ORDER — BUPROPION HCL ER (XL) 150 MG PO TB24: 150.0000 mg | ORAL_TABLET | ORAL | 2 refills | Status: DC

## 2016-02-17 MED FILL — levoFLOXacin 500 MG TABS: 500 | 10 days supply | Qty: 10 | Fill #0

## 2016-02-17 MED FILL — BUPROPION HCL XL 150 MG TAB: 150 | 90 days supply | Qty: 90 | Fill #0

## 2016-02-17 NOTE — Progress Notes (Signed)
HPI: Tracey Patterson is a 35 y.o. female  who presents to [facility] today, 02/17/16,  for chief complaint of:  Chief Complaint  Patient presents with  . Anxiety    Acute illness  . Context: e-visit rx antibiotics But patient has chronic sinus infection problems, typically requires multiple rounds of antibiotics per year. . Location: Diffuse sinuses . Quality: Pressure, mucous drainage . Duration: first started feeling sick 3 weaks ago.  . Modifying factors: Recent Augmentin, allergy medications chronically as below . Assoc signs/symptoms: allergies to dust and mold   Anxiety: Patient has tried multiple different SSRI, SNRI, Abilify in the past, typically has problems with side effects and doesn't control her symptoms very well. Uses clonazepam sparingly, has some of this left over at this point but finding that she is needing it more often lately. Has never been on Wellbutrin that she can recall.   No flowsheet data found.  No flowsheet data found.  Ganglion Cyst: On dorsum of right wrist, a bit bothersome but nonpainful. Hasn't told in the past that she may need surgery to correct this but it has been going away on its own and then coming back.        Past medical, surgical, social and family history reviewed: Patient Active Problem List   Diagnosis Date Noted  . Vitamin D deficiency 02/15/2014  . Radiculitis of left cervical region 11/30/2013  . Left lumbar radiculitis 11/30/2013  . Asthma, chronic 12/14/2012  . GERD (gastroesophageal reflux disease) 12/14/2012  . Seasonal allergies 12/14/2012  . Anxiety and depression 12/14/2012  . Transaminitis 12/14/2012  . Myalgia and myositis 10/30/2012  . Other malaise and fatigue 10/26/2012  . Flushing 10/26/2012  . Insomnia 08/13/2012  . Stress and adjustment reaction 08/13/2012   Past Surgical History:  Procedure Laterality Date  . BACK SURGERY  December 2015  . KNEE ARTHROSCOPY  1998    left  . LAPAROSCOPIC TUBAL LIGATION  02/28/2012   Procedure: LAPAROSCOPIC TUBAL LIGATION;  Surgeon: Cyril Mourning, MD;  Location: [facility];  Service: Gynecology;  Laterality: Bilateral;  . TONSILLECTOMY     Social History  Substance Use Topics  . Smoking status: Never Smoker  . Smokeless tobacco: Never Used  . Alcohol use Yes   Family History  Problem Relation Age of Onset  . Hypertension Mother   . Depression Mother   . Hypertension Father      Current medication list and allergy/intolerance information reviewed:   Current Outpatient Prescriptions on File Prior to Visit  Medication Sig Dispense Refill  . albuterol (PROVENTIL HFA;VENTOLIN HFA) 108 (90 BASE) MCG/ACT inhaler Inhale 2 puffs into the lungs every 4 (four) hours as needed for wheezing (cough, shortness of breath or wheezing.). 1 Inhaler 1  . amoxicillin-clavulanate (AUGMENTIN) 875-125 MG tablet Take 1 tablet by mouth 2 (two) times daily. 14 tablet 0  . Azelastine-Fluticasone 137-50 MCG/ACT SUSP Place 1 spray into the nose 2 (two) times daily as needed.    . cetirizine (ZYRTEC) 10 MG tablet Take 1 tablet (10 mg total) by mouth daily. 30 tablet 3  . clonazePAM (KLONOPIN) 0.5 MG tablet Half to full tab daily as needed for anxiety. 90 tablet 1  . fluticasone (FLONASE) 50 MCG/ACT nasal spray Place 2 sprays into both nostrils daily. 16 g 6  . levofloxacin (LEVAQUIN) 500 MG tablet Take 1 tablet (500 mg total) by mouth daily. 7 tablet 0  . norethindrone (CAMILA) 0.35 MG tablet One by mouth daily.  3 Package 2  . pantoprazole (PROTONIX) 40 MG tablet Take 1 tablet (40 mg total) by mouth daily. 90 tablet 3  . Vitamin D, Ergocalciferol, (DRISDOL) 50000 UNITS CAPS capsule Take 1 capsule (50,000 Units total) by mouth every 7 (seven) days. Recheck Vitamin D in 3 Months 12 capsule 0  . zolpidem (AMBIEN) 10 MG tablet TAKE 1/2 TO 1 TABLET BY MOUTH AT NIGHT FOR UP TO 8 HOURS OF SLEEP 30 tablet 1   No current facility-administered  medications on file prior to visit.    Allergies  Allergen Reactions  . Crab [Shellfish Allergy] Other (See Comments)    Allergist advises against eating crab.  . Sulfa Antibiotics Hives  . Zoloft [Sertraline Hcl]     Hair loss  . Other Rash    Dermabond, steri strips      Review of Systems:  Constitutional: +recent illness  HEENT: +sinus headache, no vision change  Cardiac: No  chest pain, No  pressure, No palpitations  Respiratory:  No  shortness of breath. +Cough  Gastrointestinal: No  abdominal pain, no change on bowel habits  Musculoskeletal: No new myalgia/arthralgia, +cyst as per HPI  Skin: No  Rash  Hem/Onc: No  easy bruising/bleeding, No  abnormal lumps/bumps  Neurologic: No  weakness, No  Dizziness  Psychiatric: No  concerns with depression, No  concerns with anxiety  Exam:  BP 116/81   Pulse 83   Ht 5' 5.5" (1.664 m)   Wt 191 lb (86.6 kg)   BMI 31.30 kg/m   Constitutional: VS see above. General Appearance: alert, well-developed, well-nourished, NAD  Eyes: Normal lids and conjunctive, non-icteric sclera  Ears, Nose, Mouth, Throat: MMM, Normal external inspection ears/nares/mouth/lips/gums.  Neck: No masses, trachea midline.   Respiratory: Normal respiratory effort.  Musculoskeletal: Gait normal. Symmetric and independent movement of all extremities  Neurological: Normal balance/coordination. No tremor.  Skin: warm, dry, intact.   Psychiatric: Normal judgment/insight. Normal mood and affect. Oriented x3.      ASSESSMENT/PLAN:   Generalized anxiety disorder - Plan: buPROPion (WELLBUTRIN XL) 150 MG 24 hr tablet  Recurrent sinusitis - Plan: CT Maxillofacial WO CM, levofloxacin (LEVAQUIN) 500 MG tablet  Ganglion cyst - Plan: Ambulatory referral to Orthopedic Surgery    Patient Instructions  Anxiety:  We are starting a medication today called Wellbutrin to help treat your anxiety and depressin. This is a daily medication to help control  your symptoms. I also highly encourage my patients who are suffering from anxiety to seek care with a counselor or therapist. A therapist can coach you in techniques to recognize and deal with troubling thought patterns and behaviors. The ability to cope with external stressors is crucial to overall mental health.   Expect a call or message from me in the next 2 weeks: If doing well on the medicine but not feeling any effect, we can increase the dose. If you're starting to feel some effect/improvement, we can hold off on a dose increase and reevaluate at your office visit.   Try the as-needed medicine, Clonazepam, at lowest effective dose, and use sparingly to prevent tolerance/dependence issues.   Let's plan to follow up in the office in 4-6 weeks. At that time, we can talk about how well the medicine is working for you, and we can consider increasing the dose, adding another medicine, etc.   If you experience problematic side effects, please let me know ASAP - we can switch the medicine any time, and we don't need an appointment  for this.    Sinuses: CT of the sinuses and new antibiotics  Cyst: Ortho surgery referral   Any questions or concerns, call me!      Visit summary with medication list and pertinent instructions was printed for patient to review. All questions at time of visit were answered - patient instructed to contact office with any additional concerns. ER/RTC precautions were reviewed with the patient. Follow-up plan: Return in about 4 weeks (around 03/16/2016) for Check up on symptoms / medication .

## 2016-02-17 NOTE — Patient Instructions (Signed)
Anxiety:  We are starting a medication today called Wellbutrin to help treat your anxiety and depressin. This is a daily medication to help control your symptoms. I also highly encourage my patients who are suffering from anxiety to seek care with a counselor or therapist. A therapist can coach you in techniques to recognize and deal with troubling thought patterns and behaviors. The ability to cope with external stressors is crucial to overall mental health.   Expect a call or message from me in the next 2 weeks: If doing well on the medicine but not feeling any effect, we can increase the dose. If you're starting to feel some effect/improvement, we can hold off on a dose increase and reevaluate at your office visit.   Try the as-needed medicine, Clonazepam, at lowest effective dose, and use sparingly to prevent tolerance/dependence issues.   Let's plan to follow up in the office in 4-6 weeks. At that time, we can talk about how well the medicine is working for you, and we can consider increasing the dose, adding another medicine, etc.   If you experience problematic side effects, please let me know ASAP - we can switch the medicine any time, and we don't need an appointment for this.    Sinuses: CT of the sinuses and new antibiotics  Cyst: Ortho surgery referral   Any questions or concerns, call me!

## 2016-02-18 ENCOUNTER — Ambulatory Visit (INDEPENDENT_AMBULATORY_CARE_PROVIDER_SITE_OTHER): Payer: 59

## 2016-02-18 ENCOUNTER — Encounter: Payer: Self-pay | Admitting: Osteopathic Medicine

## 2016-02-18 DIAGNOSIS — J329 Chronic sinusitis, unspecified: Secondary | ICD-10-CM | POA: Diagnosis not present

## 2016-02-18 DIAGNOSIS — J342 Deviated nasal septum: Secondary | ICD-10-CM

## 2016-02-18 DIAGNOSIS — R609 Edema, unspecified: Secondary | ICD-10-CM

## 2016-02-26 DIAGNOSIS — Z0000 Encounter for general adult medical examination without abnormal findings: Secondary | ICD-10-CM | POA: Diagnosis not present

## 2016-02-26 LAB — CBC
HEMATOCRIT: 41.4 % (ref 35.0–45.0)
HEMOGLOBIN: 13.9 g/dL (ref 11.7–15.5)
MCH: 31.1 pg (ref 27.0–33.0)
MCHC: 33.6 g/dL (ref 32.0–36.0)
MCV: 92.6 fL (ref 80.0–100.0)
MPV: 9.3 fL (ref 7.5–12.5)
Platelets: 398 10*3/uL (ref 140–400)
RBC: 4.47 MIL/uL (ref 3.80–5.10)
RDW: 13.3 % (ref 11.0–15.0)
WBC: 11.6 10*3/uL — AB (ref 3.8–10.8)

## 2016-02-26 LAB — COMPLETE METABOLIC PANEL WITH GFR
ALBUMIN: 4.3 g/dL (ref 3.6–5.1)
ALK PHOS: 98 U/L (ref 33–115)
ALT: 32 U/L — ABNORMAL HIGH (ref 6–29)
AST: 17 U/L (ref 10–30)
BUN: 8 mg/dL (ref 7–25)
CALCIUM: 9.1 mg/dL (ref 8.6–10.2)
CHLORIDE: 104 mmol/L (ref 98–110)
CO2: 24 mmol/L (ref 20–31)
CREATININE: 0.97 mg/dL (ref 0.50–1.10)
GFR, EST AFRICAN AMERICAN: 86 mL/min (ref >=60)
GFR, Est Non African American: 75 mL/min (ref >=60)
Glucose, Bld: 83 mg/dL (ref 65–99)
POTASSIUM: 4.3 mmol/L (ref 3.5–5.3)
Sodium: 138 mmol/L (ref 135–146)
Total Bilirubin: 0.6 mg/dL (ref 0.2–1.2)
Total Protein: 6.8 g/dL (ref 6.1–8.1)

## 2016-02-26 LAB — LIPID PANEL
CHOL/HDL RATIO: 3.5 ratio (ref <5.0)
CHOLESTEROL: 158 mg/dL (ref <200)
HDL: 45 mg/dL — ABNORMAL LOW (ref >50)
LDL Cholesterol: 76 mg/dL (ref <100)
TRIGLYCERIDES: 184 mg/dL — AB (ref <150)
VLDL: 37 mg/dL — AB (ref <30)

## 2016-03-01 ENCOUNTER — Ambulatory Visit (INDEPENDENT_AMBULATORY_CARE_PROVIDER_SITE_OTHER): Payer: Self-pay | Admitting: Orthopaedic Surgery

## 2016-03-01 ENCOUNTER — Encounter: Payer: Self-pay | Admitting: Osteopathic Medicine

## 2016-03-02 ENCOUNTER — Encounter: Payer: Self-pay | Admitting: Osteopathic Medicine

## 2016-03-08 DIAGNOSIS — Z6830 Body mass index (BMI) 30.0-30.9, adult: Secondary | ICD-10-CM | POA: Diagnosis not present

## 2016-03-08 DIAGNOSIS — N920 Excessive and frequent menstruation with regular cycle: Secondary | ICD-10-CM | POA: Diagnosis not present

## 2016-03-08 DIAGNOSIS — Z01419 Encounter for gynecological examination (general) (routine) without abnormal findings: Secondary | ICD-10-CM | POA: Diagnosis not present

## 2016-03-08 DIAGNOSIS — N942 Vaginismus: Secondary | ICD-10-CM | POA: Diagnosis not present

## 2016-03-08 MED FILL — CLOBETASOL 0.05% CREAM: 0.05 | 15 days supply | Qty: 30 | Fill #0

## 2016-03-12 ENCOUNTER — Telehealth: Payer: Self-pay | Admitting: Osteopathic Medicine

## 2016-03-12 NOTE — Telephone Encounter (Signed)
Left voice mail

## 2016-03-12 NOTE — Telephone Encounter (Signed)
-----   Message from Emeterio Reeve, DO sent at 02/17/2016  2:58 PM EST ----- Regarding: ANXIETY Call - multiple medications tried, we started low-dose Wellbutrin in addition to when necessary clonazepam

## 2016-03-16 ENCOUNTER — Ambulatory Visit: Payer: Self-pay | Admitting: Osteopathic Medicine

## 2016-03-24 ENCOUNTER — Encounter: Payer: Self-pay | Admitting: Osteopathic Medicine

## 2016-03-24 ENCOUNTER — Ambulatory Visit (INDEPENDENT_AMBULATORY_CARE_PROVIDER_SITE_OTHER): Payer: 59 | Admitting: Osteopathic Medicine

## 2016-03-24 VITALS — BP 129/78 | HR 83 | Ht 65.5 in | Wt 188.0 lb

## 2016-03-24 DIAGNOSIS — J329 Chronic sinusitis, unspecified: Secondary | ICD-10-CM

## 2016-03-24 DIAGNOSIS — F411 Generalized anxiety disorder: Secondary | ICD-10-CM

## 2016-03-24 MED ORDER — BUPROPION HCL ER (XL) 150 MG PO TB24: 300.0000 mg | ORAL_TABLET | Freq: Every day | ORAL | 1 refills | Status: DC

## 2016-03-24 MED FILL — BUPROPION HCL XL 150 MG TAB: 150 | 45 days supply | Qty: 90 | Fill #0

## 2016-03-24 NOTE — Progress Notes (Signed)
HPI: Tracey Patterson is a 35 y.o. female  who presents to [facility] today, 03/24/16,  for chief complaint of:  Chief Complaint  Patient presents with  . Follow-up    ANXIETY     Anxiety: Patient has tried multiple different SSRI, SNRI, Abilify in the past, typically has problems with side effects and doesn't control her symptoms very well. Uses clonazepam sparingly, has some of this left over at this point. Had never been on Wellbutrin that she can recall. At last visit 02/17/16, initiated Wellbutrin with sparing use Clonazepam. Increased dose Wellbutrin to 300 mg after 2 weeks w/o effect. Patient states currently she is overall feeling a lot better by is experiencing some flat affect type symptoms, decreased appetite. She is not particularly bothered by this, overall feels like she is functioning a bit better than she was prior to being on the medication.   Sinuses: CT sinus negative for infection. Patient has noticed that the Flonase is helpful, thinks she has been on nasal steroid with antihistamine nasal spray combination in the past. Is happy on the Flonase as is currently, also taking Claritin.    Depression screen [facility] 2/9 03/24/2016 02/17/2016  Decreased Interest 1 1  Down, Depressed, Hopeless 1 1  PHQ - 2 Score 2 2  Altered sleeping 2 2  Tired, decreased energy 2 3  Change in appetite 3 1  Feeling bad or failure about yourself  2 1  Trouble concentrating 1 2  Moving slowly or fidgety/restless 1 0  Suicidal thoughts 0 0  PHQ-9 Score 13 11  Some encounter information is confidential and restricted. Go to Review Flowsheets activity to see all data.    GAD 7 : Generalized Anxiety Score 03/24/2016 02/17/2016  Nervous, Anxious, on Edge 2 3  Control/stop worrying 2 3  Worry too much - different things 2 3  Trouble relaxing 2 3  Restless 2 2  Easily annoyed or irritable 1 3  Afraid - awful might happen 1 0  Total GAD 7 Score 12 17  Anxiety  Difficulty Somewhat difficult Very difficult      Past medical, surgical, social and family history reviewed: Patient Active Problem List   Diagnosis Date Noted  . Ganglion cyst 02/17/2016  . Generalized anxiety disorder 02/17/2016  . Recurrent sinusitis 02/17/2016  . Vitamin D deficiency 02/15/2014  . Radiculitis of left cervical region 11/30/2013  . Left lumbar radiculitis 11/30/2013  . Asthma, chronic 12/14/2012  . GERD (gastroesophageal reflux disease) 12/14/2012  . Seasonal allergies 12/14/2012  . Anxiety and depression 12/14/2012  . Transaminitis 12/14/2012  . Myalgia and myositis 10/30/2012  . Other malaise and fatigue 10/26/2012  . Flushing 10/26/2012  . Insomnia 08/13/2012  . Stress and adjustment reaction 08/13/2012   Past Surgical History:  Procedure Laterality Date  . BACK SURGERY  December 2015  . KNEE ARTHROSCOPY  1998   left  . LAPAROSCOPIC TUBAL LIGATION  02/28/2012   Procedure: LAPAROSCOPIC TUBAL LIGATION;  Surgeon: Cyril Mourning, MD;  Location: [facility];  Service: Gynecology;  Laterality: Bilateral;  . TONSILLECTOMY     Social History  Substance Use Topics  . Smoking status: Never Smoker  . Smokeless tobacco: Never Used  . Alcohol use Yes   Family History  Problem Relation Age of Onset  . Hypertension Mother   . Depression Mother   . Hypertension Father      Current medication list and allergy/intolerance information reviewed:   Current Outpatient Prescriptions on  File Prior to Visit  Medication Sig Dispense Refill  . albuterol (PROVENTIL HFA;VENTOLIN HFA) 108 (90 BASE) MCG/ACT inhaler Inhale 2 puffs into the lungs every 4 (four) hours as needed for wheezing (cough, shortness of breath or wheezing.). 1 Inhaler 1  . buPROPion (WELLBUTRIN XL) 150 MG 24 hr tablet Take 1 tablet (150 mg total) by mouth every morning. (Patient taking differently: Take 300 mg by mouth every morning. ) 30 tablet 2  . cetirizine (ZYRTEC) 10 MG tablet Take 1 tablet (10 mg  total) by mouth daily. 30 tablet 3  . clonazePAM (KLONOPIN) 0.5 MG tablet Half to full tab daily as needed for anxiety. 90 tablet 1  . fluticasone (FLONASE) 50 MCG/ACT nasal spray Place 2 sprays into both nostrils daily. 16 g 6  . norethindrone (CAMILA) 0.35 MG tablet One by mouth daily. 3 Package 2  . pantoprazole (PROTONIX) 40 MG tablet Take 1 tablet (40 mg total) by mouth daily. 90 tablet 3  . zolpidem (AMBIEN) 10 MG tablet TAKE 1/2 TO 1 TABLET BY MOUTH AT NIGHT FOR UP TO 8 HOURS OF SLEEP 30 tablet 1   No current facility-administered medications on file prior to visit.    Allergies  Allergen Reactions  . Crab [Shellfish Allergy] Other (See Comments)    Allergist advises against eating crab.  . Sulfa Antibiotics Hives  . Zoloft [Sertraline Hcl]     Hair loss  . Other Rash    Dermabond, steri strips      Review of Systems:  Constitutional: No recent illness  HEENT: No  headache, no vision change  Cardiac: No  chest pain, No  pressure  Gastrointestinal: No  abdominal pain, no change on bowel habits  Psychiatric: +concerns with depression, +concerns with anxiety  Exam:  BP 129/78   Pulse 83   Ht 5' 5.5" (1.664 m)   Wt 188 lb (85.3 kg)   BMI 30.81 kg/m   Constitutional: VS see above. General Appearance: alert, well-developed, well-nourished, NAD  Eyes: Normal lids and conjunctive, non-icteric sclera  Ears, Nose, Mouth, Throat: MMM  Neck: No masses, trachea midline.   Respiratory: Normal respiratory effort. no wheeze, no rhonchi, no rales  Cardiovascular: S1/S2 normal, no murmur, no rub/gallop auscultated. RRR.   Musculoskeletal: Gait normal. Symmetric and independent movement of all extremities  Neurological: Normal balance/coordination. No tremor.  Skin: warm, dry, intact.   Psychiatric: Normal judgment/insight. Normal mood and affect. Oriented x3. No SI/HI, no thought disorder  cT sinus images p reviewed with the patient   ASSESSMENT/PLAN: Advised we  can continue current dose of Wellbutrin, if she is overall functioning better I think it's reasonable to continue this medication even if not totally reaching her goals, offered psychiatry referral but patient declines at this time. If dose adjustment/alternative medication is considered, or if patient complains of symptoms not being helped by current medications, would defer to psychiatry given that she has tried and failed so many other medication regimens at this point. Patient verbalizes understanding of the plan and has no additional questions at this point  Generalized anxiety disorder - Continue current Wellbutrin, psychiatry referral if desired by patient - Plan: buPROPion (WELLBUTRIN XL) 150 MG 24 hr tablet  Chronic sinusitis, unspecified location - Declines ENT referral at this point, continue Flonase with OTC antihistamine      Visit summary with medication list and pertinent instructions was printed for patient to review. All questions at time of visit were answered - patient instructed to contact office with  any additional concerns. ER/RTC precautions were reviewed with the patient. Follow-up plan: Return in about 6 months (around 09/22/2016) for [place].

## 2016-04-05 ENCOUNTER — Ambulatory Visit (INDEPENDENT_AMBULATORY_CARE_PROVIDER_SITE_OTHER): Payer: Self-pay | Admitting: Orthopaedic Surgery

## 2016-04-05 MED FILL — FLUTICASONE PROP 50 MCG SPR: 50 | 30 days supply | Qty: 16 | Fill #1

## 2016-04-05 MED FILL — NORETHINDRONE 0.35 MG TAB: 0.35 | 84 days supply | Qty: 84 | Fill #2

## 2016-04-06 ENCOUNTER — Other Ambulatory Visit: Payer: Self-pay

## 2016-04-06 DIAGNOSIS — K219 Gastro-esophageal reflux disease without esophagitis: Secondary | ICD-10-CM

## 2016-04-06 MED ORDER — PANTOPRAZOLE SODIUM 40 MG PO TBEC: 40.0000 mg | DELAYED_RELEASE_TABLET | Freq: Every day | ORAL | 3 refills | Status: DC

## 2016-04-10 ENCOUNTER — Encounter: Payer: Self-pay | Admitting: Osteopathic Medicine

## 2016-04-12 ENCOUNTER — Other Ambulatory Visit: Payer: Self-pay | Admitting: Osteopathic Medicine

## 2016-04-12 DIAGNOSIS — F411 Generalized anxiety disorder: Secondary | ICD-10-CM

## 2016-04-12 MED ORDER — BUPROPION HCL ER (XL) 300 MG PO TB24: 300.0000 mg | ORAL_TABLET | Freq: Every day | ORAL | 1 refills | Status: DC

## 2016-04-20 MED FILL — PANTOPRAZOLE SOD DR 40 MG T: 40 | 90 days supply | Qty: 90 | Fill #0

## 2016-04-21 ENCOUNTER — Telehealth: Payer: Self-pay

## 2016-04-21 MED ORDER — ZOLPIDEM TARTRATE 10 MG PO TABS: ORAL_TABLET | ORAL | 0 refills | Status: DC

## 2016-04-21 NOTE — Telephone Encounter (Signed)
Patient request refill for Ambien 10 mg. Patient advised that appointment is needed for further refills. Tracey Patterson,CMA

## 2016-04-22 NOTE — Telephone Encounter (Signed)
From my review of the records, I don't think she is necessarily getting this every month, looks like the last prescription written was in March 2017 for 30 tablets with one refill okay to address further refills at her next visit. Ok to do one additional refill in the meantime if needed, I believe she was supposed to follow-up with me 6 months after her last visit which wasn't too long ago.

## 2016-05-12 ENCOUNTER — Encounter: Payer: Self-pay | Admitting: Osteopathic Medicine

## 2016-05-12 ENCOUNTER — Ambulatory Visit (INDEPENDENT_AMBULATORY_CARE_PROVIDER_SITE_OTHER): Payer: 59 | Admitting: Osteopathic Medicine

## 2016-05-12 VITALS — BP 118/76 | HR 79 | Temp 97.8°F | Ht 65.5 in | Wt 179.0 lb

## 2016-05-12 DIAGNOSIS — J029 Acute pharyngitis, unspecified: Secondary | ICD-10-CM

## 2016-05-12 LAB — POCT RAPID STREP A (OFFICE): Rapid Strep A Screen: NEGATIVE

## 2016-05-12 MED ORDER — LIDOCAINE VISCOUS 2 % MT SOLN: 10.0000 mL | OROMUCOSAL | 0 refills | Status: DC | PRN

## 2016-05-12 MED ORDER — PENICILLIN V POTASSIUM 500 MG PO TABS: 500.0000 mg | ORAL_TABLET | Freq: Three times a day (TID) | ORAL | 0 refills | Status: DC

## 2016-05-12 MED FILL — LIDOCAINE 2% VISCOUS SOLN: 2 | 2 days supply | Qty: 100 | Fill #0

## 2016-05-12 MED FILL — PENICILLIN VK 500 MG TABLET: 500 | 10 days supply | Qty: 30 | Fill #0

## 2016-05-12 NOTE — Patient Instructions (Addendum)
Negative strep test but I'm suspicious of a false negative. This may still be a viral illness but since you don't have tonsils and your child tested positive for strep, I'm going to go ahead a try treating for strep with antibiotics, plus lidocaine and OTC meds as below for symptom relief. If anything gets worse or changes, please call us! Finish ALL antibiotics, even if you are feeling better.    Aches/Pains, Fever Acetaminophen (Tylenol) 500 mg tablets - take max 2 tablets (1000 mg) every 6 hours (4 times per day)  Ibuprofen (Motrin) 200 mg tablets - take max 4 tablets (800 mg) every 6 hours  Sore Throat Lozenges w/ Benzocaine + Menthol (Cepacol) Honey - as much as you want! Teas which "coat the throat" - look for ingredients Elm Bark, Licorice Root, Marshmallow Root 63 Other Zinc Lozenges within 24 hours of symptoms onset - mixed evidence this shortens the duration of the common cold Don't waste your money on Vitamin C or Echinacea

## 2016-05-12 NOTE — Progress Notes (Signed)
HPI: Tracey Patterson is a 36 y.o. female who presents to [facility] 05/12/16 for chief complaint of:  Chief Complaint  Patient presents with  . Sore Throat    Acute Illness: . Context: child (+) strep . Location: throat, anterior neck lymph nodes . Quality: throat pain . Assoc signs/symptoms: see ROS, no coughing, (+)elevated temp at home w/ chills, headache . Duration: 2 days . Modifying factors: has tried the following OTC/Rx medications: Excedrin   Past medical, social and family history reviewed. Immune compromising conditions or other risk factors: none  Current medications and allergies reviewed.     Review of Systems:  Constitutional: Yes  fever/chills  HEENT: Yes  headache, Yes  sore throat, Yes  swollen glands  Cardiovascular: No chest pain  Respiratory:No  cough, No  shortness of breath  Gastrointestinal: No  nausea, No  vomiting,  No  diarrhea  Musculoskeletal:   Yes  myalgia/arthralgia  Skin/Integument:  No  rash   Detailed Exam:  BP 118/76   Pulse 79   Ht 5' 5.5" (1.664 m)   Wt 179 lb (81.2 kg)   BMI 29.33 kg/m   Constitutional:   VSS, see above.   General Appearance: alert, well-developed, well-nourished, NAD  Eyes:   Normal lids and conjunctive, non-icteric sclera  Ears, Nose, Mouth, Throat:   Normal external inspection ears/nares  Normal mouth/lips/gums, MMM  normal TM  posterior pharynx with erythema, without exudate  nasal mucosa normal  Skin:  Normal inspection, no rash or concerning lesions noted on limited exam  Neck:   No masses, trachea midline. tender but not enlarged anterior cervical LN lymph nodes  Respiratory:   Normal respiratory effort.   No  wheeze/rhonchi/rales  Cardiovascular:   S1/S2 normal, no murmur/rub/gallop auscultated. RRR.   Results for orders placed or performed in visit on 05/12/16 (from the past 72 hour(s))  POCT rapid strep A     Status: None    Collection Time: 05/12/16  8:41 AM  Result Value Ref Range   Rapid Strep A Screen Negative Negative     MODIFIED CENTOR CRITERIA (ponts if "yes"): Tonsillar exudate (1): no but no tonsils Tender Ant Cervical LN (1): yes Absence of cough (1): yes Fever (1): yes Age  72-14 (1): no 15-45 (0): yes >/= 45 (-1): no SCORE: 3-4 - confounding factors include no tonsils, (+)pharynx erythema    ASSESSMENT/PLAN:  Sore throat - Plan: POCT rapid strep A, penicillin v potassium (VEETID) 500 MG tablet, lidocaine (XYLOCAINE) 2 % solution   Patient Instructions  Negative strep test but I'm suspicious of a false negative. This may still be a viral illness but since you don't have tonsils and your child tested positive for strep, I'm going to go ahead a try treating for strep with antibiotics, plus lidocaine and OTC meds as below for symptom relief. If anything gets worse or changes, please call us! Finish ALL antibiotics, even if you are feeling better.    Aches/Pains, Fever Acetaminophen (Tylenol) 500 mg tablets - take max 2 tablets (1000 mg) every 6 hours (4 times per day)  Ibuprofen (Motrin) 200 mg tablets - take max 4 tablets (800 mg) every 6 hours  Sore Throat Lozenges w/ Benzocaine + Menthol (Cepacol) Honey - as much as you want! Teas which "coat the throat" - look for ingredients Elm Bark, Licorice Root, Marshmallow Root 63 Other Zinc Lozenges within 24 hours of symptoms onset - mixed evidence this shortens the duration of the  common cold Don't waste your money on Vitamin C or Echinacea       Visit summary was printed for the patient with medications and pertinent instructions for patient to review. ER/RTC precautions reviewed. All questions answered. Return if symptoms worsen or fail to improve.

## 2016-05-25 MED FILL — BUPROPION HCL XL 300 MG TAB: 300 | 90 days supply | Qty: 90 | Fill #0

## 2016-05-25 MED FILL — ZOLPIDEM TARTRATE 10 MG TAB: 10 | 30 days supply | Qty: 30 | Fill #0

## 2016-05-31 MED FILL — PREVIDENT 0.2% RINSE: 0.2 | 30 days supply | Qty: 473 | Fill #0

## 2016-07-02 ENCOUNTER — Other Ambulatory Visit: Payer: Self-pay

## 2016-07-02 MED ORDER — NORETHINDRONE 0.35 MG PO TABS: ORAL_TABLET | ORAL | 2 refills | Status: DC

## 2016-07-02 MED FILL — NORETHINDRONE 0.35 MG TAB: 0.35 | 84 days supply | Qty: 84 | Fill #0

## 2016-07-02 NOTE — Telephone Encounter (Signed)
Patient request refill for Camila 0.35 mg. #30 was sent to [facility]. Skylan Lara,CMA

## 2016-07-08 ENCOUNTER — Ambulatory Visit (INDEPENDENT_AMBULATORY_CARE_PROVIDER_SITE_OTHER): Payer: 59 | Admitting: Osteopathic Medicine

## 2016-07-08 ENCOUNTER — Ambulatory Visit (INDEPENDENT_AMBULATORY_CARE_PROVIDER_SITE_OTHER): Payer: 59

## 2016-07-08 ENCOUNTER — Encounter: Payer: Self-pay | Admitting: Osteopathic Medicine

## 2016-07-08 VITALS — BP 117/76 | HR 82 | Wt 183.0 lb

## 2016-07-08 DIAGNOSIS — G43109 Migraine with aura, not intractable, without status migrainosus: Secondary | ICD-10-CM | POA: Diagnosis not present

## 2016-07-08 DIAGNOSIS — M545 Low back pain: Secondary | ICD-10-CM | POA: Diagnosis not present

## 2016-07-08 DIAGNOSIS — M5136 Other intervertebral disc degeneration, lumbar region: Secondary | ICD-10-CM | POA: Diagnosis not present

## 2016-07-08 DIAGNOSIS — M5416 Radiculopathy, lumbar region: Secondary | ICD-10-CM

## 2016-07-08 MED ORDER — SUMATRIPTAN SUCCINATE 50 MG PO TABS: 50.0000 mg | ORAL_TABLET | Freq: Once | ORAL | 0 refills | Status: DC | PRN

## 2016-07-08 MED FILL — SUMATRIPTAN SUCC 50 MG TAB: 50 | 30 days supply | Qty: 9 | Fill #0

## 2016-07-08 NOTE — Patient Instructions (Signed)
Plan: 1. Try Imitrex as directed for migraines, if no help or if needed frequently, will initiate daily preventive medication 2. XRay today and will schedule MRI for lumbar spine and hip, weakness. Follow-up depending on results.

## 2016-07-08 NOTE — Progress Notes (Signed)
HPI: Tracey Patterson is a 36 y.o. female  who presents to [facility] today, 07/08/16,  for chief complaint of:  Chief Complaint  Patient presents with  . f/u migraines  . Neck Pain  . Hip Pain   Migraine: Previous diagnosis from previous PCP. Patient had been on Imitrex in the past which was helpful. Has been on birth control since noticed that headache seemed to correspond to menstrual cycle. Has been better over the past few years but over the past couple months has been bothering her. Typically on the left frontal side, occasionally into left temporal/occipital and into left shoulder area. Reports aura type symptoms with nausea and photosensitivity.   L Hip Pain: More into lower back and over greater trochanter but patient's more concerning complaint is weakness to hip flexion and foot dorsiflexion. History of back surgery to repair L4-L5 moderate left paracentral disc protrusion.      Past medical history, surgical history, social history and family history reviewed.  Patient Active Problem List   Diagnosis Date Noted  . Ganglion cyst 02/17/2016  . Generalized anxiety disorder 02/17/2016  . Recurrent sinusitis 02/17/2016  . Vitamin D deficiency 02/15/2014  . Radiculitis of left cervical region 11/30/2013  . Left lumbar radiculitis 11/30/2013  . Asthma, chronic 12/14/2012  . GERD (gastroesophageal reflux disease) 12/14/2012  . Seasonal allergies 12/14/2012  . Anxiety and depression 12/14/2012  . Transaminitis 12/14/2012  . Myalgia and myositis 10/30/2012  . Other malaise and fatigue 10/26/2012  . Flushing 10/26/2012  . Insomnia 08/13/2012  . Stress and adjustment reaction 08/13/2012   Past Surgical History:  Procedure Laterality Date  . BACK SURGERY  December 2015  . KNEE ARTHROSCOPY  1998   left  . LAPAROSCOPIC TUBAL LIGATION  02/28/2012   Procedure: LAPAROSCOPIC TUBAL LIGATION;  Surgeon: Cyril Mourning, MD;  Location: [facility];   Service: Gynecology;  Laterality: Bilateral;  . TONSILLECTOMY      Current medication list and allergy/intolerance information reviewed.   Current Outpatient Prescriptions on File Prior to Visit  Medication Sig Dispense Refill  . albuterol (PROVENTIL HFA;VENTOLIN HFA) 108 (90 BASE) MCG/ACT inhaler Inhale 2 puffs into the lungs every 4 (four) hours as needed for wheezing (cough, shortness of breath or wheezing.). 1 Inhaler 1  . buPROPion (WELLBUTRIN XL) 300 MG 24 hr tablet Take 1 tablet (300 mg total) by mouth daily. 90 tablet 1  . cetirizine (ZYRTEC) 10 MG tablet Take 1 tablet (10 mg total) by mouth daily. 30 tablet 3  . clonazePAM (KLONOPIN) 0.5 MG tablet Half to full tab daily as needed for anxiety. 90 tablet 1  . fluticasone (FLONASE) 50 MCG/ACT nasal spray Place 2 sprays into both nostrils daily. 16 g 6  . lidocaine (XYLOCAINE) 2 % solution Use as directed 10 mLs in the mouth or throat every 4 (four) hours as needed for mouth pain. 100 mL 0  . norethindrone (CAMILA) 0.35 MG tablet One by mouth daily. 3 Package 2  . pantoprazole (PROTONIX) 40 MG tablet Take 1 tablet (40 mg total) by mouth daily. 90 tablet 3  . penicillin v potassium (VEETID) 500 MG tablet Take 1 tablet (500 mg total) by mouth 3 (three) times daily. 30 tablet 0  . zolpidem (AMBIEN) 10 MG tablet TAKE 1/2 TO 1 TABLET BY MOUTH AT NIGHT FOR UP TO 8 HOURS OF SLEEP APPOINTMENT NEEDED FOR FURTHER REFILLS 30 tablet 0   No current facility-administered medications on file prior to visit.  Allergies  Allergen Reactions  . Crab [Shellfish Allergy] Other (See Comments)    Allergist advises against eating crab.  . Sulfa Antibiotics Hives  . Zoloft [Sertraline Hcl]     Hair loss  . Other Rash    Dermabond, steri strips      Review of Systems:  Constitutional: No recent illness  HEENT: +headache, no vision change  Cardiac: No  chest pain  Respiratory:  No  shortness of breath.  GU/Gastrointestinal: No  abdominal pain,  no change on bowel habits, no urinary/stool incontinence  Musculoskeletal: +new myalgia/arthralgia  Skin: No  Rash  Neurologic: +weakness as per HPI, No  Dizziness   Exam:  BP 117/76   Pulse 82   Wt 183 lb (83 kg)   BMI 29.99 kg/m   Constitutional: VS see above. General Appearance: alert, well-developed, well-nourished, NAD  Eyes: Normal lids and conjunctive, non-icteric sclera  Ears, Nose, Mouth, Throat: MMM, Normal external inspection ears/nares/mouth/lips/gums.  Neck: No masses, trachea midline.   Respiratory: Normal respiratory effort. no wheeze, no rhonchi, no rales  Cardiovascular: S1/S2 normal, no murmur, no rub/gallop auscultated. RRR.   Musculoskeletal: Gait normal. Symmetric and independent movement of all extremities. Straight leg raise negative bilaterally. Strength 5 out of 5 to flexion, knee flexion/extension, dorsiflexion/plantarflexion but notably diminished on left side to hip flexion, knee flexion and ankle dorsiflexion  Neurological: Normal balance/coordination. No tremor.  Skin: warm, dry, intact.   Psychiatric: Normal judgment/insight. Normal mood and affect. Oriented x3.     Dg Lumbar Spine 2-3 Views  Result Date: 07/08/2016 CLINICAL DATA:  Low back pain for 2 weeks.  No injury. EXAM: LUMBAR SPINE - 2-3 VIEW COMPARISON:  MRI 04/25/2015 FINDINGS: Loss of normal cervical lordosis, stable since prior MRI. Early degenerative disc disease changes at L4-5 with disc space narrowing. No fracture or subluxation. SI joints are symmetric and unremarkable. IMPRESSION: Early degenerative disc disease changes at L4-5. Lumbar straightening. No acute bony abnormality. Electronically Signed   By: Rolm Baptise M.D.   On: 07/08/2016 11:58   Dg Hip Unilat W Or W/o Pelvis 2-3 Views Left  Result Date: 07/08/2016 CLINICAL DATA:  Left low back pain for 2 weeks. Radiates into left hip. No injury. EXAM: DG HIP (WITH OR WITHOUT PELVIS) 2-3V LEFT COMPARISON:  None. FINDINGS: Hip  joints and SI joints are symmetric and unremarkable. No acute bony abnormality. Specifically, no fracture, subluxation, or dislocation. Soft tissues are intact. IMPRESSION: Negative. Electronically Signed   By: Rolm Baptise M.D.   On: 07/08/2016 11:59       ASSESSMENT/PLAN:   Migraine with aura and without status migrainosus, not intractable - Sounds like possibly tension headaches, trial Imitrex and if no improvement would consider preventative therapy  - Plan: SUMAtriptan (IMITREX) 50 MG tablet  Left lumbar radiculitis - Plan: MR LUMBAR SPINE WO CONTRAST, DG Lumbar Spine 2-3 Views, DG HIP UNILAT W OR W/O PELVIS 2-3 VIEWS LEFT    Patient Instructions  Plan: 1. Try Imitrex as directed for migraines, if no help or if needed frequently, will initiate daily preventive medication 2. XRay today and will schedule MRI for lumbar spine and hip, weakness. Follow-up depending on results.   Pending MRI results would strongly advise follow-up with neurosurgeon, patient declines physical therapy at this time.  Follow-up plan: Return in about 2 weeks (around 07/22/2016) for RECHECK HEADACHES, REVIEW MRI, sooner if needed.  Visit summary with medication list and pertinent instructions was printed for patient to review, alert [place] if any  changes needed. All questions at time of visit were answered - patient instructed to contact office with any additional concerns. ER/RTC precautions were reviewed with the patient and understanding verbalized.

## 2016-07-12 ENCOUNTER — Ambulatory Visit (INDEPENDENT_AMBULATORY_CARE_PROVIDER_SITE_OTHER): Payer: 59

## 2016-07-12 DIAGNOSIS — M5146 Schmorl's nodes, lumbar region: Secondary | ICD-10-CM

## 2016-07-12 DIAGNOSIS — M5126 Other intervertebral disc displacement, lumbar region: Secondary | ICD-10-CM | POA: Diagnosis not present

## 2016-07-12 DIAGNOSIS — M5136 Other intervertebral disc degeneration, lumbar region: Secondary | ICD-10-CM

## 2016-07-15 ENCOUNTER — Ambulatory Visit (INDEPENDENT_AMBULATORY_CARE_PROVIDER_SITE_OTHER): Payer: 59 | Admitting: Sports Medicine

## 2016-07-15 DIAGNOSIS — M24152 Other articular cartilage disorders, left hip: Secondary | ICD-10-CM | POA: Insufficient documentation

## 2016-07-15 DIAGNOSIS — G8929 Other chronic pain: Secondary | ICD-10-CM

## 2016-07-15 DIAGNOSIS — M25552 Pain in left hip: Secondary | ICD-10-CM | POA: Diagnosis not present

## 2016-07-15 DIAGNOSIS — M1612 Unilateral primary osteoarthritis, left hip: Secondary | ICD-10-CM

## 2016-07-15 MED ORDER — MELOXICAM 15 MG PO TABS: ORAL_TABLET | ORAL | 3 refills | Status: DC

## 2016-07-15 MED FILL — MELOXICAM 15 MG TABLET: 15 | 30 days supply | Qty: 30 | Fill #0

## 2016-07-15 NOTE — Progress Notes (Signed)
   Subjective:    I'm seeing this patient as a consultation for:  Dr. Emeterio Reeve  CC: Left hip pain  HPI: For months this pleasant 36 year old female has had pain that she localizes on the anterior aspect of her left hip and groin with radiation around to the buttock. Worse with weightbearing, flexion of the hip, moderate, persistent without radiation, only minimal mechanical symptoms. Nothing overtly radicular down the leg. She is post left L4-L5 foraminotomy and microdiscectomy, but her current pain feels completely different than her previous discogenic and radicular symptoms.  Past medical history:  Negative.  See flowsheet/record as well for more information.  Surgical history: Negative.  See flowsheet/record as well for more information.  Family history: Negative.  See flowsheet/record as well for more information.  Social history: Negative.  See flowsheet/record as well for more information.  Allergies, and medications have been entered into the medical record, reviewed, and no changes needed.   Review of Systems: No headache, visual changes, nausea, vomiting, diarrhea, constipation, dizziness, abdominal pain, skin rash, fevers, chills, night sweats, weight loss, swollen lymph nodes, body aches, joint swelling, muscle aches, chest pain, shortness of breath, mood changes, visual or auditory hallucinations.   Objective:   General: Well Developed, well nourished, and in no acute distress.  Neuro/Psych: Alert and oriented x3, extra-ocular muscles intact, able to move all 4 extremities, sensation grossly intact. Skin: Warm and dry, no rashes noted.  Respiratory: Not using accessory muscles, speaking in full sentences, trachea midline.  Cardiovascular: Pulses palpable, no extremity edema. Abdomen: Does not appear distended. Left Hip: ROM IR: 60 Deg, reproduction of pain with internal rotation, ER: 60 Deg, Flexion: 120 Deg, Extension: 100 Deg, Abduction: 45 Deg, Adduction: 45  Deg Strength IR: 5/5, ER: 5/5, Flexion: 5/5, Extension: 5/5, Abduction: 5/5, Adduction: 5/5 Pelvic alignment unremarkable to inspection and palpation. Standing hip rotation and gait without trendelenburg / unsteadiness. Greater trochanter without tenderness to palpation. No tenderness over piriformis. No SI joint tenderness and normal minimal SI movement. Reproduction of pain with taking the hip from neutral to flexion/abduction/external rotation.  Impression and Recommendations:   This case required medical decision making of moderate complexity.  Chronic left hip pain History and exam consistent with hip labral injury. MRI arthrogram ordered, meloxicam for pain. Return for arthrogram injection.

## 2016-07-15 NOTE — Assessment & Plan Note (Signed)
History and exam consistent with hip labral injury. MRI arthrogram ordered, meloxicam for pain. Return for arthrogram injection.

## 2016-07-16 MED FILL — PANTOPRAZOLE SOD DR 40 MG T: 40 | 90 days supply | Qty: 90 | Fill #1

## 2016-07-19 ENCOUNTER — Encounter: Payer: Self-pay | Admitting: Sports Medicine

## 2016-07-21 ENCOUNTER — Encounter: Payer: Self-pay | Admitting: Sports Medicine

## 2016-07-22 ENCOUNTER — Other Ambulatory Visit: Payer: Self-pay | Admitting: Physician Assistant

## 2016-07-22 ENCOUNTER — Ambulatory Visit: Payer: 59 | Admitting: Osteopathic Medicine

## 2016-07-22 DIAGNOSIS — M5416 Radiculopathy, lumbar region: Secondary | ICD-10-CM

## 2016-07-22 MED ORDER — DICLOFENAC SODIUM 75 MG PO TBEC: 75.0000 mg | DELAYED_RELEASE_TABLET | Freq: Two times a day (BID) | ORAL | 3 refills | Status: DC | PRN

## 2016-07-22 MED FILL — DICLOFENAC SOD 75 MG TAB EC: 75 | 15 days supply | Qty: 30 | Fill #0

## 2016-07-22 NOTE — Progress Notes (Signed)
Patient sent MyChart message that she is not having any pain relief with Meloxicam and Tylenol. Discontinued Meloxicam and sent prescription for Diclofenac.

## 2016-08-02 ENCOUNTER — Encounter: Payer: Self-pay | Admitting: Sports Medicine

## 2016-08-02 ENCOUNTER — Ambulatory Visit (INDEPENDENT_AMBULATORY_CARE_PROVIDER_SITE_OTHER): Payer: 59

## 2016-08-02 ENCOUNTER — Ambulatory Visit (INDEPENDENT_AMBULATORY_CARE_PROVIDER_SITE_OTHER): Payer: 59 | Admitting: Sports Medicine

## 2016-08-02 DIAGNOSIS — M1612 Unilateral primary osteoarthritis, left hip: Secondary | ICD-10-CM | POA: Diagnosis not present

## 2016-08-02 DIAGNOSIS — M25552 Pain in left hip: Secondary | ICD-10-CM

## 2016-08-02 DIAGNOSIS — G8929 Other chronic pain: Secondary | ICD-10-CM

## 2016-08-02 NOTE — Progress Notes (Signed)
  Procedure: Real-time Ultrasound Guided gadolinium contrast injection of left hip joint Device: GE Logiq E  Verbal informed consent obtained.  Time-out conducted.  Noted no overlying erythema, induration, or other signs of local infection.  Skin prepped in a sterile fashion.  Local anesthesia: Topical Ethyl chloride.  With sterile technique and under real time ultrasound guidance:  I did see what appeared to be a cam-type lesion on the femoral neck, I advanced a 22-gauge needle to the femoral head/neck junction, initially I injected 1 mL kenalog 40, 2 mL lidocaine, 2 mL bupivacaine, syringe switched and 0.1 mL gadolinium injected, syringe again switched and 12 mL sterile saline used to flush the needle and distend the joint. Joint visualized and capsule seen distending confirming intra-articular placement of contrast material and medication. Completed without difficulty  Advised to call if fevers/chills, erythema, induration, drainage, or persistent bleeding.  Images permanently stored and available for review in the ultrasound unit.  Impression: Technically successful ultrasound guided gadolinium contrast injection for MR arthrography.  Please see separate MR arthrogram report.

## 2016-08-02 NOTE — Assessment & Plan Note (Signed)
Symptoms concerning for a hip labral injury. Gadolinium injection for the arthrogram performed today, awaiting report.

## 2016-08-18 MED FILL — BUPROPION HCL XL 300 MG TAB: 300 | 90 days supply | Qty: 90 | Fill #1

## 2016-08-30 MED FILL — DICLOFENAC SOD 75 MG TAB EC: 75 | 15 days supply | Qty: 30 | Fill #1

## 2016-09-07 ENCOUNTER — Ambulatory Visit (INDEPENDENT_AMBULATORY_CARE_PROVIDER_SITE_OTHER): Payer: 59 | Admitting: Sports Medicine

## 2016-09-07 ENCOUNTER — Encounter: Payer: Self-pay | Admitting: Sports Medicine

## 2016-09-07 DIAGNOSIS — M25512 Pain in left shoulder: Secondary | ICD-10-CM

## 2016-09-07 DIAGNOSIS — G8929 Other chronic pain: Secondary | ICD-10-CM

## 2016-09-07 DIAGNOSIS — M1612 Unilateral primary osteoarthritis, left hip: Secondary | ICD-10-CM | POA: Diagnosis not present

## 2016-09-07 DIAGNOSIS — M24152 Other articular cartilage disorders, left hip: Secondary | ICD-10-CM

## 2016-09-07 MED ORDER — TRAMADOL HCL 50 MG PO TABS: 50.0000 mg | ORAL_TABLET | Freq: Three times a day (TID) | ORAL | 0 refills | Status: DC | PRN

## 2016-09-07 NOTE — Progress Notes (Signed)
  Subjective:    CC: Follow-up  HPI: Left hip pain: MRI arthrogram confirms labral degenerative fraying. No better after the injection.  Left shoulder pain: Localized at the joint line with significant candidal symptoms.  Past medical history:  Negative.  See flowsheet/record as well for more information.  Surgical history: Negative.  See flowsheet/record as well for more information.  Family history: Negative.  See flowsheet/record as well for more information.  Social history: Negative.  See flowsheet/record as well for more information.  Allergies, and medications have been entered into the medical record, reviewed, and no changes needed.   Review of Systems: No fevers, chills, night sweats, weight loss, chest pain, or shortness of breath.   Objective:    General: Well Developed, well nourished, and in no acute distress.  Neuro: Alert and oriented x3, extra-ocular muscles intact, sensation grossly intact.  HEENT: Normocephalic, atraumatic, pupils equal round reactive to light, neck supple, no masses, no lymphadenopathy, thyroid nonpalpable.  Skin: Warm and dry, no rashes. Cardiac: Regular rate and rhythm, no murmurs rubs or gallops, no lower extremity edema.  Respiratory: Clear to auscultation bilaterally. Not using accessory muscles, speaking in full sentences. Left Shoulder: Inspection reveals no abnormalities, atrophy or asymmetry. Palpation is normal with no tenderness over AC joint or bicipital groove. ROM is full in all planes. Rotator cuff strength normal throughout. No signs of impingement with negative Neer and Hawkin's tests, empty can. Speeds and Yergason's tests normal. Positive Obrien's, negative crank, negative clunk, and good stability. Normal scapular function observed. No painful arc and no drop arm sign. 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 Continue Voltaren, adding tramadol for breakthrough pain. Aggressive physical therapy for the next 6 weeks. I explained to her the limitations of arthroscopy for labral tears.  Left shoulder pain Positive O'Brien's test, also suspect labral injury. She will work on her shoulder as well as physical therapy.  I spent 25 minutes with this patient, greater than 50% was face-to-face time counseling regarding the above diagnoses

## 2016-09-07 NOTE — Assessment & Plan Note (Signed)
Did not improve with the steroid in the arthrogram injection. Continue Voltaren, adding tramadol for breakthrough pain. Aggressive physical therapy for the next 6 weeks. I explained to her the limitations of arthroscopy for labral tears.

## 2016-09-07 NOTE — Assessment & Plan Note (Signed)
Positive O'Brien's test, also suspect labral injury. She will work on her shoulder as well as physical therapy.

## 2016-09-09 MED FILL — traMADol HCL 50 MG TABS: 50 | 10 days supply | Qty: 30 | Fill #0

## 2016-09-15 ENCOUNTER — Ambulatory Visit (INDEPENDENT_AMBULATORY_CARE_PROVIDER_SITE_OTHER): Payer: 59 | Admitting: Physical Therapy

## 2016-09-15 ENCOUNTER — Encounter: Payer: Self-pay | Admitting: Physical Therapy

## 2016-09-15 DIAGNOSIS — M62838 Other muscle spasm: Secondary | ICD-10-CM | POA: Diagnosis not present

## 2016-09-15 DIAGNOSIS — G8929 Other chronic pain: Secondary | ICD-10-CM

## 2016-09-15 DIAGNOSIS — M25652 Stiffness of left hip, not elsewhere classified: Secondary | ICD-10-CM

## 2016-09-15 DIAGNOSIS — M25612 Stiffness of left shoulder, not elsewhere classified: Secondary | ICD-10-CM | POA: Diagnosis not present

## 2016-09-15 DIAGNOSIS — M6281 Muscle weakness (generalized): Secondary | ICD-10-CM

## 2016-09-15 DIAGNOSIS — M25552 Pain in left hip: Secondary | ICD-10-CM

## 2016-09-15 DIAGNOSIS — M25512 Pain in left shoulder: Secondary | ICD-10-CM

## 2016-09-15 NOTE — Therapy (Signed)
[place], [place], [address] Phone: [phone]   Fax:  [phone]  Physical Therapy Evaluation  Patient Details  Name: Tracey Patterson MRN: [id number] Date of Birth: [birth year] Referring Provider: Dr Dianah Field  Encounter Date: 09/15/2016      PT End of Session - 09/15/16 0843    Visit Number 1   Number of Visits 12   Date for PT Re-Evaluation 10/27/16   PT Start Time 0843   PT Stop Time 0936   PT Time Calculation (min) 53 min      Past Medical History:  Diagnosis Date  . Anxiety   . Asthma   . History of TMJ disorder     Past Surgical History:  Procedure Laterality Date  . BACK SURGERY  December 2015  . KNEE ARTHROSCOPY  1998   left  . LAPAROSCOPIC TUBAL LIGATION  02/28/2012   Procedure: LAPAROSCOPIC TUBAL LIGATION;  Surgeon: Cyril Mourning, MD;  Location: [facility];  Service: Gynecology;  Laterality: Bilateral;  . TONSILLECTOMY      There were no vitals filed for this visit.       Subjective Assessment - 09/15/16 0843    Subjective Pt reports she noticed Lt hip pain over two years ago and thought it was from her back.  The pain continued after surgery on back two years ago, she has had further assessment of her back.  Saw Dr T and reports it is her hip, degenerative changes in the labrum.  For the Lt shoulder she has intermittent pain over 8 yrs ago.  Has had PT for both body parts years ago. She is referred to PT to address both problems.    Pertinent History Low back discectomy L4-5, 2016, Rt hip has just started popping some.    How long can you sit comfortably? 23'    How long can you walk comfortably? 10-15 minutes   Diagnostic tests x-rays, MRI - hip only recently   Patient Stated Goals keep from having surgery. improve and manage her pain.    Currently in Pain? Yes  no shoulder pain right now.    Pain Score 4    Pain Location Hip   Pain Orientation  Left;Posterior;Lateral  will wrap around the hip joint depending on movement   Pain Descriptors / Indicators Aching;Dull;Sharp   Pain Type Chronic pain   Pain Onset More than a month ago   Pain Frequency Constant   Aggravating Factors  walking and certain motion   Pain Relieving Factors nothing when the pain is really bad, going to start tramadol.             [facility] PT Assessment - 09/15/16 0001      Assessment   Medical Diagnosis Lt hip and Lt shoulder degenerative labral changes   Referring Provider Dr Dianah Field   Onset Date/Surgical Date 09/16/14   Hand Dominance Right   Next MD Visit 10/19/16   Prior Therapy never for hip, years ago for shoulder     Precautions   Precautions None     Balance Screen   Has the patient fallen in the past 6 months No     [place] residence   Home Access Stairs to enter  usually ok on stairs   Home Layout One level     Prior Function   Level of Independence Independent   Vocation Full time employment   Conservation officer, nature  Leisure play with kids, walk     Observation/Other Assessments   Focus on Therapeutic Outcomes (FOTO)  61% limited     Functional Tests   Functional tests Squat;Single leg stance     Squat   Comments popping in Lt hip at ~ 30 degrees, pinching after      Single Leg Stance   Comments bilat WNL     Posture/Postural Control   Posture/Postural Control Postural limitations   Postural Limitations Forward head;Increased lumbar lordosis     ROM / Strength   AROM / PROM / Strength AROM;Strength     AROM   AROM Assessment Site Shoulder;Cervical;Lumbar;Hip   Right/Left Shoulder Left  Rt WNL   Left Shoulder Extension 130 Degrees  55   Left Shoulder Flexion 130 Degrees  pain starting at ~ 90 degrees   Left Shoulder ABduction 96 Degrees  painful   Left Shoulder Internal Rotation 62 Degrees   Left Shoulder External Rotation 75 Degrees  pain at endrange.    Right/Left Hip Left;Right   Right Hip External Rotation  57   Right Hip Internal Rotation  31   Left Hip External Rotation  40  pain   Left Hip Internal Rotation  13  hard endfeel.    Cervical Flexion WNL   Cervical Extension WNL   Cervical - Right Side Bend WNL   Cervical - Left Side Bend WNL   Cervical - Right Rotation WNL   Cervical - Left Rotation WNL   Lumbar Flexion WNL   Lumbar Extension WNl   Lumbar - Right Side Bend WNL   Lumbar - Left Side Bend WNL - some stiffness Rt Low back   Lumbar - Right Rotation WNL   Lumbar - Left Rotation WNL - some stiffness in Rt low back     Strength   Overall Strength Comments mid traps Lt 4+/5, low traps 4-/5 bilat.    Strength Assessment Site Shoulder;Elbow;Hip;Knee;Ankle   Right/Left Shoulder --  Rt WNL, Lt grossly 4+/5 throughout   Right/Left Elbow --  Rt WNL, Lt triceps 4+/5, biceps 5-/5   Right/Left Hip Left  Rt WNL   Left Hip Flexion 4+/5   Left Hip Extension 4/5   Left Hip ABduction 3+/5   Right/Left Knee --  Rt WNL, Lt 5-/5   Right/Left Ankle --  WNL     Flexibility   Soft Tissue Assessment /Muscle Length yes  tight pecs bilat.    Hamstrings supine SLR Rt 105, Lt 88   Quadriceps prone knee flex Rt 134, Lt 132     Palpation   Spinal mobility hypomobile T 1-6, tender with CPA L4, L1 and upper thoracic .   Palpation comment tender around Lt hip joint, especially in upper gluts and piriformis. Tighness in lumbar paraspinals with tenderness in the Lt side. Tender around Lt shoulder complex especaially at the anterior [facility] joint.             Objective measurements completed on examination: See above findings.          [facility] PT Treatment/Exercise - 09/15/16 0001      Exercises   Exercises Shoulder;Knee/Hip     Knee/Hip Exercises: Stretches   Other Knee/Hip Stretches single knee to opposite shoulder stretch     Knee/Hip Exercises: Supine   Bridges Strengthening;Both;10 reps   Bridges with Clamshell  Strengthening;Both;10 reps  in hooklying with green band     Shoulder Exercises: Standing   External Rotation Strengthening;Both;10 reps;Theraband  Theraband Level (Shoulder External Rotation) Level 2 (Red)     Shoulder Exercises: Stretch   Other Shoulder Stretches low doorway stretch      Modalities   Modalities Iontophoresis     Iontophoresis   Type of Iontophoresis Dexamethasone   Location anterior Lt shoulder   Dose 1.0cc   Time 121mpm patch                PT Education - 09/15/16 1252    Education provided Yes   Education Details HEP and iontophoresis   Person(s) Educated Patient   Methods Explanation;Demonstration;Handout   Comprehension Returned demonstration;Verbalized understanding             PT Long Term Goals - 09/15/16 1259      PT LONG TERM GOAL #1   Title I with advanced HEP ( 10/27/16)    Time 6   Period Weeks   Status New     PT LONG TERM GOAL #2   Title demo painfree Lt shoulder ROM WFL ( 10/27/16)    Time 6   Period Weeks   Status New     PT LONG TERM GOAL #3   Title improve Lt shoulder strength =/> 5-/5 to allow her to perform her work without difficulty ( 10/27/16)    Time 6   Period Weeks   Status New     PT LONG TERM GOAL #4   Title improve Lt [facility] ease with donning shoes/socks ( 10/27/16)    Time 6   Period Weeks   Status New     PT LONG TERM GOAL #5   Title demo Lt hip strength =/> 5-/5 to assist with improving her ability to walk around at work ( 10/27/16)    Time 6   Period Weeks   Status New     PT LONG TERM GOAL #6   Title reports overall improvement of pain by =/> 75% with daily activities ( 10/27/16)    Time 6   Period Weeks   Status New     PT LONG TERM GOAL #7   Title improve FOTO =/< 44% limited ( 10/27/16)    Time 6   Period Weeks   Status New                Plan - 09/15/16 1255    Clinical Impression Statement JAvenellpresents with Lt shoulder and hip pain,  She has  weakness, stiffness and pain in both areas. She is point tender to palpation and is limited in her functional abilities and tightness in the Lt hip muscles, Lt pecs and posterior rotator cuff muscles.    History and Personal Factors relevant to plan of care: Lt knee scope 1998, back surgery 2015, myalgia, h/o cervical and lumbar radiculopathy.    Clinical Presentation Evolving   Clinical Decision Making Moderate      Patient will benefit from skilled therapeutic intervention in order to improve the following deficits and impairments:  Decreased range of motion, Difficulty walking, Impaired UE functional use, Increased muscle spasms, Pain, Impaired flexibility, Decreased strength  Visit Diagnosis: Pain in left hip - Plan: PT plan of care cert/re-cert  Chronic left shoulder pain - Plan: PT plan of care cert/re-cert  Muscle weakness (generalized) - Plan: PT plan of care cert/re-cert  Stiffness of left hip, not elsewhere classified - Plan: PT plan of care cert/re-cert  Stiffness of left shoulder, not elsewhere classified - Plan: PT plan of  care cert/re-cert  Other muscle spasm - Plan: PT plan of care cert/re-cert     Problem List Patient Active Problem List   Diagnosis Date Noted  . Left shoulder pain 09/07/2016  . Degenerative tear of acetabular labrum of left hip 07/15/2016  . Ganglion cyst 02/17/2016  . Generalized anxiety disorder 02/17/2016  . Recurrent sinusitis 02/17/2016  . Vitamin D deficiency 02/15/2014  . Radiculitis of left cervical region 11/30/2013  . Left lumbar radiculitis 11/30/2013  . Asthma, chronic 12/14/2012  . GERD (gastroesophageal reflux disease) 12/14/2012  . Seasonal allergies 12/14/2012  . Anxiety and depression 12/14/2012  . Transaminitis 12/14/2012  . Myalgia and myositis 10/30/2012  . Other malaise and fatigue 10/26/2012  . Flushing 10/26/2012  . Insomnia 08/13/2012  . Stress and adjustment reaction 08/13/2012    Jeral Pinch PT  09/15/2016,  1:10 PM  [facility] [place], [place], [address] Phone: [phone]   Fax:  [phone]  Name: Tracey Patterson MRN: [id number] Date of Birth: 06-04-1980

## 2016-09-15 NOTE — Patient Instructions (Addendum)
IONTOPHORESIS PATIENT PRECAUTIONS & CONTRAINDICATIONS:  . Redness under one or both electrodes can occur.  This characterized by a uniform redness that usually disappears within 12 hours of treatment. . Small pinhead size blisters may result in response to the drug.  Contact your physician if the problem persists more than 24 hours. . On rare occasions, iontophoresis therapy can result in temporary skin reactions such as rash, inflammation, irritation or burns.  The skin reactions may be the result of individual sensitivity to the ionic solution used, the condition of the skin at the start of treatment, reaction to the materials in the electrodes, allergies or sensitivity to dexamethasone, or a poor connection between the patch and your skin.  Discontinue using iontophoresis if you have any of these reactions and report to your therapist. . Remove the Patch or electrodes if you have any undue sensation of pain or burning during the treatment and report discomfort to your therapist. . Tell your Therapist if you have had known adverse reactions to the application of electrical current. . If using the Patch, the LED light will turn off when treatment is complete and the patch can be removed.  Approximate treatment time is 1-3 hours.  Remove the patch when light goes off or after 6 hours. . The Patch can be worn during normal activity, however excessive motion where the electrodes have been placed can cause poor contact between the skin and the electrode or uneven electrical current resulting in greater risk of skin irritation. Marland Kitchen Keep out of the reach of children.   . DO NOT use if you have a cardiac pacemaker or any other electrically sensitive implanted device. . DO NOT use if you have a known sensitivity to dexamethasone. . DO NOT use during Magnetic Resonance Imaging (MRI). . DO NOT use over broken or compromised skin (e.g. sunburn, cuts, or acne) due to the increased risk of skin reaction. . DO  NOT SHAVE over the area to be treated:  To establish good contact between the Patch and the skin, excessive hair may be clipped. . DO NOT place the Patch or electrodes on or over your eyes, directly over your heart, or brain. . DO NOT reuse the Patch or electrodes as this may cause burns to occur.   Trigger Point Dry Needling  . What is Trigger Point Dry Needling (DN)? o DN is a physical therapy technique used to treat muscle pain and dysfunction. Specifically, DN helps deactivate muscle trigger points (muscle knots).  o A thin filiform needle is used to penetrate the skin and stimulate the underlying trigger point. The goal is for a local twitch response (LTR) to occur and for the trigger point to relax. No medication of any kind is injected during the procedure.   . What Does Trigger Point Dry Needling Feel Like?  o The procedure feels different for each individual patient. Some patients report that they do not actually feel the needle enter the skin and overall the process is not painful. Very mild bleeding may occur. However, many patients feel a deep cramping in the muscle in which the needle was inserted. This is the local twitch response.   Marland Kitchen How Will I feel after the treatment? o Soreness is normal, and the onset of soreness may not occur for a few hours. Typically this soreness does not last longer than two days.  o Bruising is uncommon, however; ice can be used to decrease any possible bruising.  o In rare cases feeling tired  or nauseous after the treatment is normal. In addition, your symptoms may get worse before they get better, this period will typically not last longer than 24 hours.   . What Can I do After My Treatment? o Increase your hydration by drinking more water for the next 24 hours. o You may place ice or heat on the areas treated that have become sore, however, do not use heat on inflamed or bruised areas. Heat often brings more relief post needling. o You can  continue your regular activities, but vigorous activity is not recommended initially after the treatment for 24 hours. o DN is best combined with other physical therapy such as strengthening, stretching, and other therapies.   Bridging    Slowly raise buttocks from floor, keeping stomach tight. Repeat __15-20__ times per set. 10 sec holds. Do _1___ sets per session. Do __1__ sessions per day.      Extensors / Rotators, Supine    Lie supine, one leg straight, other leg bent, knee held by opposite hand. Gently pull knee toward opposite shoulder. Feel stretch in buttocks and outside of hip. Hold __20-30_ seconds. Repeat _1__ times per session. Do _1__ sessions per day. Repeat on the other leg.   Scapular Retraction: Abduction / Extension (Prone) - hold 1# wts or a soup/vegetable can.     Lie with arms out from sides 90. Pinch shoulder blades together and raise arms a few inches from floor. Repeat _15___ times per set. Do __2-3__ sets per session. Do _1___ sessions per day.  Resisted External Rotation: in Neutral - Bilateral    Sit or stand, tubing in both hands, elbows at sides, bent to 90, forearms forward. Pinch shoulder blades together and rotate forearms out. Keep elbows at sides. Repeat __15__ times per set. Do __2-3__ sets per session. Do ___1_ sessions per day.  Scapula Adduction With Pectorals, Low   Stand in doorframe with palms against frame and arms at 45. Take a small step forward and squeeze shoulder blades. Hold _30-45__ seconds. Repeat _1__ times per session. Do 1-2___ sessions per day. Copyright  [employer]. All rights reserved.

## 2016-09-22 ENCOUNTER — Ambulatory Visit (INDEPENDENT_AMBULATORY_CARE_PROVIDER_SITE_OTHER): Payer: 59 | Admitting: Physical Therapy

## 2016-09-22 DIAGNOSIS — G8929 Other chronic pain: Secondary | ICD-10-CM | POA: Diagnosis not present

## 2016-09-22 DIAGNOSIS — M25512 Pain in left shoulder: Secondary | ICD-10-CM | POA: Diagnosis not present

## 2016-09-22 DIAGNOSIS — M62838 Other muscle spasm: Secondary | ICD-10-CM

## 2016-09-22 DIAGNOSIS — M25552 Pain in left hip: Secondary | ICD-10-CM

## 2016-09-22 DIAGNOSIS — M6281 Muscle weakness (generalized): Secondary | ICD-10-CM

## 2016-09-22 DIAGNOSIS — M25612 Stiffness of left shoulder, not elsewhere classified: Secondary | ICD-10-CM | POA: Diagnosis not present

## 2016-09-22 DIAGNOSIS — M25652 Stiffness of left hip, not elsewhere classified: Secondary | ICD-10-CM

## 2016-09-22 NOTE — Patient Instructions (Addendum)
Shoulder:    Resisted External Rotation: in Neutral - Bilateral  PALMS UP!!! Sit or stand, tubing in both hands, elbows at sides, bent to 90, forearms forward. Pinch shoulder blades together and rotate forearms out. Keep elbows at sides. Repeat __10__ times per set. Do __2-3__ sets per session. Do __3-4__ sessions per week.  Resistive Band Rowing   With resistive band anchored in door, grasp both ends. Keeping elbows bent, pull back, squeezing shoulder blades together. Hold _3-5___ seconds. Repeat _10-30___ times. Do __1__ sessions per day.  Doorway stretch, midlevel (can do one arm at a time)  Hip:   Clams with band Bridges Adductor Stretch: Reclined (Strap, Wall)    Warm up with leg vertical. Rotate leg to side and fix foot to wall. Anchor opposite hip. Hold for __30__ seconds. Repeat __2__ times each leg.  Hamstring Step 1    Straighten left knee. Keep knee level with other knee or on bolster. Hold __30_ seconds. Relax knee by returning foot to start. Repeat _2-3__ times.   *self massage to hip and shoulder with roller stick (hip) or ball (any tight muscle), to tolerance.   Trigger Point Dry Needling  . What is Trigger Point Dry Needling (DN)? o DN is a physical therapy technique used to treat muscle pain and dysfunction. Specifically, DN helps deactivate muscle trigger points (muscle knots).  o A thin filiform needle is used to penetrate the skin and stimulate the underlying trigger point. The goal is for a local twitch response (LTR) to occur and for the trigger point to relax. No medication of any kind is injected during the procedure.   . What Does Trigger Point Dry Needling Feel Like?  o The procedure feels different for each individual patient. Some patients report that they do not actually feel the needle enter the skin and overall the process is not painful. Very mild bleeding may occur. However, many patients feel a deep cramping in the muscle in which the  needle was inserted. This is the local twitch response.   Marland Kitchen How Will I feel after the treatment? o Soreness is normal, and the onset of soreness may not occur for a few hours. Typically this soreness does not last longer than two days.  o Bruising is uncommon, however; ice can be used to decrease any possible bruising.  o In rare cases feeling tired or nauseous after the treatment is normal. In addition, your symptoms may get worse before they get better, this period will typically not last longer than 24 hours.   . What Can I do After My Treatment? o Increase your hydration by drinking more water for the next 24 hours. o You may place ice or heat on the areas treated that have become sore, however, do not use heat on inflamed or bruised areas. Heat often brings more relief post needling. o You can continue your regular activities, but vigorous activity is not recommended initially after the treatment for 24 hours. o DN is best combined with other physical therapy such as strengthening, stretching, and other therapies.   [facility] [place], [place] [address]  [phone] (office) [phone] (fax)

## 2016-09-22 NOTE — Therapy (Addendum)
[place], [place], [address] Phone: [phone]   Fax:  [phone]  Physical Therapy Treatment  Patient Details  Name: Tracey Patterson MRN: [id number] Date of Birth: 14-Jul-1980 Referring Provider: Dr. Dianah Field  Encounter Date: 09/22/2016      PT End of Session - 09/22/16 1147    Visit Number 2   Number of Visits 12   Date for PT Re-Evaluation 10/27/16   PT Start Time 1019   PT Stop Time 1130   PT Time Calculation (min) 71 min      Past Medical History:  Diagnosis Date  . Anxiety   . Asthma   . History of TMJ disorder     Past Surgical History:  Procedure Laterality Date  . BACK SURGERY  December 2015  . KNEE ARTHROSCOPY  1998   left  . LAPAROSCOPIC TUBAL LIGATION  02/28/2012   Procedure: LAPAROSCOPIC TUBAL LIGATION;  Surgeon: Cyril Mourning, MD;  Location: [facility];  Service: Gynecology;  Laterality: Bilateral;  . TONSILLECTOMY      There were no vitals filed for this visit.      Subjective Assessment - 09/22/16 1019    Subjective She reports she is stiff, sore, achey all over. She has done the exercises of HEP; she fatigues quickly with bridging and shoulder exercises.  The band exercise was painful when she performed clams after bridging.  She had to ice shoulder/hip after exercises due to increased pain.    Currently in Pain? Yes   Pain Score 5    Pain Location Shoulder   Pain Orientation Left   Pain Descriptors / Indicators Tightness;Sore   Aggravating Factors  pushing/pulling/lifting    Pain Relieving Factors rest, ice/heat, medication   Multiple Pain Sites Yes   Pain Score 3   Pain Location Hip   Pain Orientation Left   Pain Descriptors / Indicators Aching;Stabbing   Aggravating Factors  walking, putting on pants, squatting    Pain Relieving Factors medication, rest            [facility] PT Assessment - 09/22/16 0001      Assessment   Medical Diagnosis Lt hip  and Lt shoulder degenerative labral changes   Referring Provider Dr. Dianah Field   Onset Date/Surgical Date 09/16/14   Hand Dominance Right   Next MD Visit 10/19/16   Prior Therapy never for hip, years ago for shoulder                     [facility] Adult PT Treatment/Exercise - 09/22/16 0001      Self-Care   Self-Care Other Self-Care Comments   Other Self-Care Comments  Pt educated on self massage with roller stick and ball; pt able to return demo and verbalize understanding.      Knee/Hip Exercises: Stretches   Passive Hamstring Stretch Left;2 reps;30 seconds   Piriformis Stretch Right;Left;2 reps;30 seconds   Other Knee/Hip Stretches Adductor stretch supine with strap x 30 sec x 2 reps     Knee/Hip Exercises: Aerobic   Nustep L5: arms/legs x 6 min      Knee/Hip Exercises: Supine   Bridges Strengthening;Both;10 reps;2 sets  2nd set with 3 sec hold.    Other Supine Knee/Hip Exercises supine clam shell with green band x 10, core engaged.  Then unilateral clam x 5 reps each leg.      Shoulder Exercises: Supine   External Rotation Strengthening;Both;10 reps;Theraband  2 sets, one with  red/one with green     Shoulder Exercises: Standing   Row Strengthening;Both;10 reps;Theraband   Theraband Level (Shoulder Row) Level 3 (Green)     Shoulder Exercises: Stretch   Other Shoulder Stretches midlevel doorway stretch x 30 sec x 2 reps; trial of high and low position (pt with low tolerance to these positions)     Modalities   Modalities Electrical Stimulation;Moist Heat;Iontophoresis     Moist Heat Therapy   Number Minutes Moist Heat 15 Minutes   Moist Heat Location Hip  Lt      Electrical Stimulation   Electrical Stimulation Location Lt ant/post hip    Electrical Stimulation Action IFC   Electrical Stimulation Parameters to tolerance    Electrical Stimulation Goals Pain     Iontophoresis   Type of Iontophoresis Dexamethasone   Location anterior Lt shoulder   Dose  1.0cc   Time 143m patch (12 hrs)     Manual Therapy   Manual Therapy Myofascial release;Soft tissue mobilization   Soft tissue mobilization TPR to Lt piriformis/glute med   Myofascial Release MFR to Lt adductor group and Lt glute                 PT Education - 09/22/16 1146    Education provided Yes   Education Details HEP and info on DN.  Pt issued another green band, for rowing and supine shoudler ER    Person(s) Educated Patient   Methods Explanation;Handout;Demonstration;Tactile cues   Comprehension Returned demonstration;Verbalized understanding             PT Long Term Goals - 09/15/16 1259      PT LONG TERM GOAL #1   Title I with advanced HEP ( 10/27/16)    Time 6   Period Weeks   Status New     PT LONG TERM GOAL #2   Title demo painfree Lt shoulder ROM WFL ( 10/27/16)    Time 6   Period Weeks   Status New     PT LONG TERM GOAL #3   Title improve Lt shoulder strength =/> 5-/5 to allow her to perform her work without difficulty ( 10/27/16)    Time 6   Period Weeks   Status New     PT LONG TERM GOAL #4   Title [facility] Lt [facility] ease with don[facility] shoes/socks ( 10/27/16)    Time 6   Period Weeks   Status New     PT LONG TERM GOAL #5   Title demo Lt hip strength =/> 5-/5 to assist with improving her ability to walk around at work ( 10/27/16)    Time 6   Period Weeks   Status New     PT LONG TERM GOAL #6   Title reports overall improvement of pain by =/> 75% with daily activities ( 10/27/16)    Time 6   Period Weeks   Status New     PT LONG TERM GOAL #7   Title improve FOTO =/< 44% limited ( 10/27/16)    Time 6   Period Weeks   Status New               Plan - 09/22/16 1203    Clinical Impression Statement Pt had tightness/tenderness in Lt hip adductors and hip rotator with manual therapy.  Crepitus noted in Lt posterior shoulder with Lt shoulder abduction and row.   HEP was reviewed and minor adjustments to form and  position were made.  Pt reported decreased Lt hip tightness and pain at end of session.  Progressing towards goals.   Pt will be seen 2 times per week for a total of six weeks.  POC to include, modalities PRN, there ex, manual therapy, neuromuscular re-ed, dry needling, patient education.    PT Next Visit Plan Assess response to 2nd ionto treatment.  manual therapy/US to Lt shoulder.  Continue progressive strengthening to Lt shoulder/hip.    Consulted and Agree with Plan of Care Patient      Patient will benefit from skilled therapeutic intervention in order to improve the following deficits and impairments:  Decreased range of motion, Difficulty walking, Impaired UE functional use, Increased muscle spasms, Pain, Impaired flexibility, Decreased strength  Visit Diagnosis: Pain in left hip  Chronic left shoulder pain  Muscle weakness (generalized)  Stiffness of left hip, not elsewhere classified  Stiffness of left shoulder, not elsewhere classified  Other muscle spasm     Problem List Patient Active Problem List   Diagnosis Date Noted  . Left shoulder pain 09/07/2016  . Degenerative tear of acetabular labrum of left hip 07/15/2016  . Ganglion cyst 02/17/2016  . Generalized anxiety disorder 02/17/2016  . Recurrent sinusitis 02/17/2016  . Vitamin D deficiency 02/15/2014  . Radiculitis of left cervical region 11/30/2013  . Left lumbar radiculitis 11/30/2013  . Asthma, chronic 12/14/2012  . GERD (gastroesophageal reflux disease) 12/14/2012  . Seasonal allergies 12/14/2012  . Anxiety and depression 12/14/2012  . Transaminitis 12/14/2012  . Myalgia and myositis 10/30/2012  . Other malaise and fatigue 10/26/2012  . Flushing 10/26/2012  . Insomnia 08/13/2012  . Stress and adjustment reaction 08/13/2012   Kerin Perna, PTA 09/22/16 12:17 PM  Jeral Pinch, PT 09/23/16 8:30 AM  [facility] [place], [place], [address] Phone: [phone]   Fax:  [phone]  Name: Tracey Patterson MRN: [id number] Date of Birth: [birth year]

## 2016-09-24 ENCOUNTER — Ambulatory Visit (INDEPENDENT_AMBULATORY_CARE_PROVIDER_SITE_OTHER): Payer: 59 | Admitting: Physical Therapy

## 2016-09-24 DIAGNOSIS — M25512 Pain in left shoulder: Secondary | ICD-10-CM | POA: Diagnosis not present

## 2016-09-24 DIAGNOSIS — M25652 Stiffness of left hip, not elsewhere classified: Secondary | ICD-10-CM | POA: Diagnosis not present

## 2016-09-24 DIAGNOSIS — M6281 Muscle weakness (generalized): Secondary | ICD-10-CM | POA: Diagnosis not present

## 2016-09-24 DIAGNOSIS — M25612 Stiffness of left shoulder, not elsewhere classified: Secondary | ICD-10-CM | POA: Diagnosis not present

## 2016-09-24 DIAGNOSIS — M25552 Pain in left hip: Secondary | ICD-10-CM

## 2016-09-24 DIAGNOSIS — G8929 Other chronic pain: Secondary | ICD-10-CM | POA: Diagnosis not present

## 2016-09-24 NOTE — Therapy (Signed)
[place], [place], [address] Phone: [phone]   Fax:  [phone]  Physical Therapy Treatment  Patient Details  Name: Tracey Patterson MRN: [id number] Date of Birth: 11/29/1980 Referring Provider: Dr. Dianah Field  Encounter Date: 09/24/2016      PT End of Session - 09/24/16 1054    Visit Number 3   Number of Visits 12   Date for PT Re-Evaluation 10/27/16   PT Start Time 7124   PT Stop Time 1045   PT Time Calculation (min) 71 min   Activity Tolerance Patient tolerated treatment well;No increased pain   Behavior During Therapy WFL for tasks assessed/performed      Past Medical History:  Diagnosis Date  . Anxiety   . Asthma   . History of TMJ disorder     Past Surgical History:  Procedure Laterality Date  . BACK SURGERY  December 2015  . KNEE ARTHROSCOPY  1998   left  . LAPAROSCOPIC TUBAL LIGATION  02/28/2012   Procedure: LAPAROSCOPIC TUBAL LIGATION;  Surgeon: Cyril Mourning, MD;  Location: [facility];  Service: Gynecology;  Laterality: Bilateral;  . TONSILLECTOMY      There were no vitals filed for this visit.      Subjective Assessment - 09/24/16 0942    Subjective "My shoulder doesn't usually hurt unless I'm lifting something, moving something, when I've worked all weekend.  My hip aches usually all day. My hip didn't hurt as bad putting pants on today". Exercises went better by changing the order.    Pertinent History Low back discectomy L4-5, 2016, Rt hip has just started popping some.    Patient Stated Goals keep from having surgery. improve and manage her pain.    Currently in Pain? Yes   Pain Score 4    Pain Location Knee          [facility] Adult PT Treatment/Exercise - 09/24/16 0001      Knee/Hip Exercises: Stretches   Passive Hamstring Stretch Left;2 reps;30 seconds   Piriformis Stretch Right;Left;2 reps;30 seconds   Other Knee/Hip Stretches Adductor stretch supine with  strap x 30 sec x 2 reps, ITB with strap, 30 sec x 2 reps each leg.      Knee/Hip Exercises: Aerobic   Nustep L5: arms/legs x 7.5 min   PTA present to discuss progress     Knee/Hip Exercises: Supine   Other Supine Knee/Hip Exercises supine clam shell with green band x 10, core engaged.  Then unilateral clam x 5 reps each leg.    Other Supine Knee/Hip Exercises Transverse ab series:  TA contraction with hip flexion x 5 each leg, unilateral clam x 5 each leg, heel slide x 5 each side.   Then resisted unilateral hip flexion with same side hand into thigh x 8 reps each side.      Shoulder Exercises: Standing   External Rotation Strengthening;Both;5 reps  3 sets   Theraband Level (Shoulder External Rotation) Level 2 (Red)   Extension Strengthening;Both;12 reps;Theraband   Theraband Level (Shoulder Extension) Level 2 (Red)   Row Strengthening;Both;Theraband;12 reps  3 sec hold in scap retraction   Theraband Level (Shoulder Row) Level 2 (Red)     Shoulder Exercises: Stretch   Other Shoulder Stretches midlevel doorway stretch x 30 sec x 2 reps.   Other Shoulder Stretches Lt tricep stretch x 2 reps x 15 sec     Modalities   Modalities --  pt declined  Manual Therapy   Manual Therapy Soft tissue mobilization;Taping   Soft tissue mobilization Edge tool assistance to Lt pec, Lt posterior shoulder to decrease fascial restrictions and pain.    TPR to Lt glutes,/ deep rotators.    Kinesiotex Facilitate Muscle     Kinesiotix   Facilitate Muscle  I strip of Rock tape applied to ant / post deltoid with 15% stretch to tape.  I strip perpendicular Lt levator to decompress tissue/decrease pain/ increase proprioception.                      PT Long Term Goals - 09/24/16 1635      PT LONG TERM GOAL #1   Title I with advanced HEP ( 10/27/16)    Time 6   Period Weeks   Status On-going     PT LONG TERM GOAL #2   Title demo painfree Lt shoulder ROM WFL ( 10/27/16)    Time 6   Period  Weeks   Status On-going     PT LONG TERM GOAL #3   Title improve Lt shoulder strength =/> 5-/5 to allow her to perform her work without difficulty ( 10/27/16)    Time 6   Period Weeks   Status On-going     PT LONG TERM GOAL #4   Title improve Lt hip IR/ER to [facility] to allow her ease with donning shoes/socks ( 10/27/16)    Time 6   Period Weeks   Status On-going     PT LONG TERM GOAL #5   Title demo Lt hip strength =/> 5-/5 to assist with improving her ability to walk around at work ( 10/27/16)    Time 6   Period Weeks   Status On-going     PT LONG TERM GOAL #6   Title reports overall improvement of pain by =/> 75% with daily activities ( 10/27/16)    Time 6   Period Weeks   Status On-going     PT LONG TERM GOAL #7   Title improve FOTO =/< 44% limited ( 10/27/16)    Time 6   Period Weeks   Status On-going               Plan - 09/24/16 0947    Clinical Impression Statement Pt tolerated all exercises well, reporting some discomfort in Lt ant hip with resisted hip flexion. Trial of IASTM and Rock tape for Lt shoulder to decrease tightness/pain.  Progressing gradually towards goals.    Clinical Presentation Evolving   Clinical Decision Making Moderate   PT Frequency 2x / week   PT Duration 6 weeks   PT Treatment/Interventions Cryotherapy;Electrical Stimulation;Iontophoresis 36m/ml Dexamethasone;Moist Heat;Therapeutic exercise;Neuromuscular re-education;Patient/family education;Manual techniques;Dry needling;Vasopneumatic Device;Taping;ADLs/Self Care Home Management   PT Next Visit Plan manual therapy/US to Lt shoulder.  Continue progressive strengthening to Lt shoulder/hip.    Consulted and Agree with Plan of Care Patient      Patient will benefit from skilled therapeutic intervention in order to improve the following deficits and impairments:  Decreased range of motion, Difficulty walking, Impaired UE functional use, Increased muscle spasms, Pain, Impaired flexibility, Decreased  strength  Visit Diagnosis: Pain in left hip  Chronic left shoulder pain  Muscle weakness (generalized)  Stiffness of left hip, not elsewhere classified  Stiffness of left shoulder, not elsewhere classified     Problem List Patient Active Problem List   Diagnosis Date Noted  . Left shoulder pain 09/07/2016  . Degenerative tear of  acetabular labrum of left hip 07/15/2016  . Ganglion cyst 02/17/2016  . Generalized anxiety disorder 02/17/2016  . Recurrent sinusitis 02/17/2016  . Vitamin D deficiency 02/15/2014  . Radiculitis of left cervical region 11/30/2013  . Left lumbar radiculitis 11/30/2013  . Asthma, chronic 12/14/2012  . GERD (gastroesophageal reflux disease) 12/14/2012  . Seasonal allergies 12/14/2012  . Anxiety and depression 12/14/2012  . Transaminitis 12/14/2012  . Myalgia and myositis 10/30/2012  . Other malaise and fatigue 10/26/2012  . Flushing 10/26/2012  . Insomnia 08/13/2012  . Stress and adjustment reaction 08/13/2012   Kerin Perna, PTA 09/24/16 4:35 PM  [place], [place], [address] Phone: [phone]   Fax:  [phone]  Name: Tracey Patterson MRN: [id number] Date of Birth: 09-14-1980

## 2016-09-24 NOTE — Patient Instructions (Signed)
  Abdominal Bracing With Pelvic Floor (Hook-Lying)   With neutral spine, tighten pelvic floor and abdominals. Hold 10 seconds. Repeat __10_ times. Do _1__ times a day.   Knee to Chest: Transverse Plane Stability   Bring one knee up, then return. Be sure pelvis does not roll side to side. Keep pelvis still. Lift knee __10_ times each leg. Restabilize pelvis. Repeat with other leg. Do _1-2__ sets, _1__ times per day.   Hip External Rotation With Pillow: Transverse Plane Stability   One knee bent, one leg straight, on pillow. Slowly roll bent knee out. Be sure pelvis does not rotate. Do _10__ times. Restabilize pelvis. Repeat with other leg. Do _1-2__ sets, _1__ times per day.  Heel Slide: 4-10 Inches - Transverse Plane Stability   Slide heel 4 inches down. Be sure pelvis does not rotate. Do _10__ times. Restabilize pelvis. Repeat with other leg. Do __1_ sets, _1__ times per day.   [facility] at [facility] [place], [place] [address]  [phone] (office) [phone] (fax)

## 2016-09-28 ENCOUNTER — Ambulatory Visit (INDEPENDENT_AMBULATORY_CARE_PROVIDER_SITE_OTHER): Payer: 59 | Admitting: Physical Therapy

## 2016-09-28 DIAGNOSIS — M25552 Pain in left hip: Secondary | ICD-10-CM | POA: Diagnosis not present

## 2016-09-28 DIAGNOSIS — M6281 Muscle weakness (generalized): Secondary | ICD-10-CM | POA: Diagnosis not present

## 2016-09-28 DIAGNOSIS — M25512 Pain in left shoulder: Secondary | ICD-10-CM | POA: Diagnosis not present

## 2016-09-28 DIAGNOSIS — G8929 Other chronic pain: Secondary | ICD-10-CM

## 2016-09-28 DIAGNOSIS — M25612 Stiffness of left shoulder, not elsewhere classified: Secondary | ICD-10-CM

## 2016-09-28 DIAGNOSIS — M25652 Stiffness of left hip, not elsewhere classified: Secondary | ICD-10-CM | POA: Diagnosis not present

## 2016-09-28 NOTE — Patient Instructions (Signed)
Abduction: Clam (Eccentric) - Side-Lying    Lie on side with knees bent. Lift top knee, keeping feet together. Keep trunk steady. Slowly lower for 3-5 seconds. __10_ reps per set, __2-3_ sets per day, _4__ days per week.  Engage core muscles.  Try ball or towel between feet.   * Theracane was the tool you used in therapy today for self massage/ trigger point release.   [facility] at [facility] [place], [place] [address]  [phone] (office) [phone] (fax)

## 2016-09-28 NOTE — Therapy (Signed)
[place], [place], [address] Phone: [phone]   Fax:  [phone]  Physical Therapy Treatment  Patient Details  Name: Tracey Patterson MRN: [id number] Date of Birth: [birth year] Referring Provider: Dr. Dianah Field  Encounter Date: 09/28/2016      PT End of Session - 09/28/16 1206    Visit Number 4   Number of Visits 12   Date for PT Re-Evaluation 10/27/16   PT Start Time 1517   PT Stop Time 6160   PT Time Calculation (min) 58 min   Activity Tolerance Patient tolerated treatment well;No increased pain   Behavior During Therapy WFL for tasks assessed/performed      Past Medical History:  Diagnosis Date  . Anxiety   . Asthma   . History of TMJ disorder     Past Surgical History:  Procedure Laterality Date  . BACK SURGERY  December 2015  . KNEE ARTHROSCOPY  1998   left  . LAPAROSCOPIC TUBAL LIGATION  02/28/2012   Procedure: LAPAROSCOPIC TUBAL LIGATION;  Surgeon: Cyril Mourning, MD;  Location: [facility];  Service: Gynecology;  Laterality: Bilateral;  . TONSILLECTOMY      There were no vitals filed for this visit.      Subjective Assessment - 09/28/16 1206    Subjective Pt reports 10-20% improvement in pain since initiating therapy.  Pain is no longer constant. She feels the tape on Lt shoulder helped.  Pt did pretty well first 2 work days, but by 3rd shift Sunday, pain was up to 8/10.   She iced/heated, stretches; nothing seemed to help except sleep.    Patient Stated Goals keep from having surgery. improve and manage her pain.    Currently in Pain? Yes   Pain Score 5    Pain Location Shoulder   Pain Orientation Left   Pain Descriptors / Indicators Tightness;Sore   Aggravating Factors  pushing / pulling/ lifting    Pain Relieving Factors ?   Multiple Pain Sites Yes   Pain Score 5   Pain Location Hip   Pain Orientation Left   Pain Descriptors / Indicators Stabbing;Aching    Aggravating Factors  walking, putting pants on, squatting   Pain Relieving Factors rest            [facility] PT Assessment - 09/28/16 0001      Assessment   Medical Diagnosis Lt hip and Lt shoulder degenerative labral changes   Referring Provider Dr. Dianah Field   Onset Date/Surgical Date 09/16/14   Hand Dominance Right   Next MD Visit 10/19/16   Prior Therapy never for hip, years ago for shoulder     AROM   Left Shoulder Extension 40 Degrees   Left Shoulder Flexion 12 Degrees  standing, with pain   Left Shoulder ABduction 130 Degrees  standing, with pain   Left Shoulder Internal Rotation 57 Degrees  supine   Left Shoulder External Rotation 90 Degrees  supine                     [facility] Adult PT Treatment/Exercise - 09/28/16 0001      Self-Care   Other Self-Care Comments  Pt educated on self massage with ball to Lt hamstring, theracane to Lt rhomboid,levator ; pt able to return demo and verbalize understanding.      Knee/Hip Exercises: Stretches   Passive Hamstring Stretch 2 reps;30 seconds;Right;Left  supine with strap   Piriformis Stretch Right;Left;2 reps;30 seconds  Other Knee/Hip Stretches Adductor stretch supine with strap x 30 sec x 1 rep;  ITB stretch in supine with band x 2 reps x 30 sec each leg.      Knee/Hip Exercises: Aerobic   Nustep L5: arms/legs x 5 min      Knee/Hip Exercises: Supine   Bridges Strengthening;Both;10 reps;2 sets   3 sec hold.      Knee/Hip Exercises: Sidelying   Clams LLE x 10 with core engaged and ball between feet, slow speed.      Shoulder Exercises: Standing   Flexion AROM;Left;10 reps  to 80 deg, mirror for feedback   ABduction AROM;Left;10 reps  to 45-90 deg, mirror for feedback     Shoulder Exercises: Stretch   Other Shoulder Stretches midlevel doorway stretch x 30 sec x 2 reps.     Manual Therapy   Manual Therapy Soft tissue mobilization;Taping   Soft tissue mobilization Edge tool assistance to Lt pec, Lt  posterior shoulder, Lt levator, Lt rhomboid to decrease fascial restrictions and pain.       Kinesiotex Facilitate Muscle     Kinesiotix   Facilitate Muscle  I strip of Rock tape applied to ant / post deltoid with 15% stretch to tape.  I strip perpendicular Lt levator to decompress tissue/decrease pain/ increase proprioception.                      PT Long Term Goals - 09/24/16 1635      PT LONG TERM GOAL #1   Title I with advanced HEP ( 10/27/16)    Time 6   Period Weeks   Status On-going     PT LONG TERM GOAL #2   Title demo painfree Lt shoulder ROM WFL ( 10/27/16)    Time 6   Period Weeks   Status On-going     PT LONG TERM GOAL #3   Title improve Lt shoulder strength =/> 5-/5 to allow her to perform her work without difficulty ( 10/27/16)    Time 6   Period Weeks   Status On-going     PT LONG TERM GOAL #4   Title improve Lt hip IR/ER to [facility] to allow her ease with donning shoes/socks ( 10/27/16)    Time 6   Period Weeks   Status On-going     PT LONG TERM GOAL #5   Title demo Lt hip strength =/> 5-/5 to assist with improving her ability to walk around at work ( 10/27/16)    Time 6   Period Weeks   Status On-going     PT LONG TERM GOAL #6   Title reports overall improvement of pain by =/> 75% with daily activities ( 10/27/16)    Time 6   Period Weeks   Status On-going     PT LONG TERM GOAL #7   Title improve FOTO =/< 44% limited ( 10/27/16)    Time 6   Period Weeks   Status On-going               Plan - 09/28/16 1250    Clinical Impression Statement Pt had positive response to manual therapy and Rock tape to shoulder; reported decreased pain with work.  Repeated IASTM and Rock tape to Lt shoulder.  Pt demonstrated dyskinesis in Lt shoulder with AROM in flexion/abduction; improved with mirror for visual feedback and reducing range.  Progressing towards goals.    PT Frequency 2x / week   PT Duration  6 weeks   PT Treatment/Interventions  Cryotherapy;Electrical Stimulation;Iontophoresis 31m/ml Dexamethasone;Moist Heat;Therapeutic exercise;Neuromuscular re-education;Patient/family education;Manual techniques;Dry needling;Vasopneumatic Device;Taping;ADLs/Self Care Home Management   PT Next Visit Plan Trial of DN, manual therapy to Lt shoulder/ Lt hip.  Continue progressive stretching/ strengthening of Lt shoulder/hip.       Patient will benefit from skilled therapeutic intervention in order to improve the following deficits and impairments:  Decreased range of motion, Difficulty walking, Impaired UE functional use, Increased muscle spasms, Pain, Impaired flexibility, Decreased strength  Visit Diagnosis: Pain in left hip  Chronic left shoulder pain  Muscle weakness (generalized)  Stiffness of left hip, not elsewhere classified  Stiffness of left shoulder, not elsewhere classified     Problem List Patient Active Problem List   Diagnosis Date Noted  . Left shoulder pain 09/07/2016  . Degenerative tear of acetabular labrum of left hip 07/15/2016  . Ganglion cyst 02/17/2016  . Generalized anxiety disorder 02/17/2016  . Recurrent sinusitis 02/17/2016  . Vitamin D deficiency 02/15/2014  . Radiculitis of left cervical region 11/30/2013  . Left lumbar radiculitis 11/30/2013  . Asthma, chronic 12/14/2012  . GERD (gastroesophageal reflux disease) 12/14/2012  . Seasonal allergies 12/14/2012  . Anxiety and depression 12/14/2012  . Transaminitis 12/14/2012  . Myalgia and myositis 10/30/2012  . Other malaise and fatigue 10/26/2012  . Flushing 10/26/2012  . Insomnia 08/13/2012  . Stress and adjustment reaction 08/13/2012   JKerin Perna PTA 09/28/16 1:50 PM  [place] [address]Phone: [phone]  Fax:  [phone] Name: Tracey ShorMRN: [id number] of Birth: 2June 30, 1982

## 2016-09-30 ENCOUNTER — Encounter: Payer: Self-pay | Admitting: Physical Therapy

## 2016-10-01 ENCOUNTER — Ambulatory Visit (INDEPENDENT_AMBULATORY_CARE_PROVIDER_SITE_OTHER): Payer: 59 | Admitting: Physical Therapy

## 2016-10-01 ENCOUNTER — Encounter: Payer: Self-pay | Admitting: Physical Therapy

## 2016-10-01 DIAGNOSIS — M6281 Muscle weakness (generalized): Secondary | ICD-10-CM

## 2016-10-01 DIAGNOSIS — M25512 Pain in left shoulder: Secondary | ICD-10-CM

## 2016-10-01 DIAGNOSIS — M62838 Other muscle spasm: Secondary | ICD-10-CM | POA: Diagnosis not present

## 2016-10-01 DIAGNOSIS — G8929 Other chronic pain: Secondary | ICD-10-CM

## 2016-10-01 DIAGNOSIS — M25612 Stiffness of left shoulder, not elsewhere classified: Secondary | ICD-10-CM

## 2016-10-01 DIAGNOSIS — M25652 Stiffness of left hip, not elsewhere classified: Secondary | ICD-10-CM | POA: Diagnosis not present

## 2016-10-01 DIAGNOSIS — M25552 Pain in left hip: Secondary | ICD-10-CM | POA: Diagnosis not present

## 2016-10-01 NOTE — Therapy (Signed)
[place] [facility] [place], [place], [address] Phone: [phone]   Fax:  [phone]  Physical Therapy Treatment  Patient Details  Name: Tracey Patterson MRN: [id number] Date of Birth: 02/22/1981 Referring Provider: Dr. Dianah Field  Encounter Date: 10/01/2016      PT End of Session - 10/01/16 0851    Visit Number 5   Number of Visits 12   Date for PT Re-Evaluation 10/27/16   PT Start Time 0850   PT Stop Time 0956   PT Time Calculation (min) 66 min   Activity Tolerance Patient limited by pain   Behavior During Therapy [facility] for tasks assessed/performed      Past Medical History:  Diagnosis Date  . Anxiety   . Asthma   . History of TMJ disorder     Past Surgical History:  Procedure Laterality Date  . BACK SURGERY  December 2015  . KNEE ARTHROSCOPY  1998   left  . LAPAROSCOPIC TUBAL LIGATION  02/28/2012   Procedure: LAPAROSCOPIC TUBAL LIGATION;  Surgeon: Cyril Mourning, MD;  Location: [facility];  Service: Gynecology;  Laterality: Bilateral;  . TONSILLECTOMY      There were no vitals filed for this visit.      Subjective Assessment - 10/01/16 0851    Subjective Patient reports that she was feeling fine until she fell yesterday. Patient got up off the couch too fast, took five steps and fell. Pain level was an 8 all over yesterday. She feels that the tape is helping her.    Pertinent History Low back discectomy L4-5, 2016, Rt hip has just started popping some.    How long can you sit comfortably? 79'    How long can you walk comfortably? 10-15 minutes   Diagnostic tests x-rays, MRI - hip only recently   Patient Stated Goals keep from having surgery. improve and manage her pain.    Currently in Pain? Yes   Pain Score 4    Pain Location Shoulder   Pain Orientation Left   Pain Descriptors / Indicators Aching  Ache around shoulder blade, some areas are sharp to the touch; front of shoulder is a pinching  pain when reaching for things   Pain Onset More than a month ago   Multiple Pain Sites Yes   Pain Score 4   Pain Location Hip   Pain Orientation Left   Pain Descriptors / Indicators Aching;Dull;Tightness  Pulling towards the back; yesterday was a constant pain   Pain Relieving Factors heat, stretching for the hip           [facility] Adult PT Treatment/Exercise - 10/01/16 0001      Knee/Hip Exercises: Stretches   Passive Hamstring Stretch 2 reps;30 seconds;Right;Left  supine with strap   Hip Flexor Stretch Left;2 reps;30 seconds  leg off of table (thomas position)   Piriformis Stretch Right;Left;2 reps;30 seconds   Other Knee/Hip Stretches Adductor stretch supine with strap x 30 sec x 2 rep;  ITB stretch in supine with band x 2 reps x 30 sec each leg.      Knee/Hip Exercises: Aerobic   Nustep L5: arms/legs x 6 min      Knee/Hip Exercises: Sidelying   Clams LLE x 10 with core engaged, 2 sets, slow speed.      Moist Heat Therapy   Number Minutes Moist Heat 15 Minutes   Moist Heat Location Hip;Shoulder  Lt     Theme park manager  Lt ant/post hip and Lt shoulder   Electrical Stimulation Action premod to each area   Electrical Stimulation Parameters to tolerance    Electrical Stimulation Goals Pain     Manual Therapy   Manual Therapy Scapular mobilization;Soft tissue mobilization;Myofascial release;Taping   Manual therapy comments pt in sidelying, supine.    Soft tissue mobilization STM to Lt levator, upper trap, pec (very guarded); TPR to Lt hip rotators with contract / relax.    Myofascial Release MFR to Lt pec maj/minor      Kinesiotix   Facilitate Muscle  I strip of Rock tape applied to ant / post deltoid with 15% stretch to tape.  I strip perpendicular Lt levator to decompress tissue/decrease pain/ increase proprioception.                      PT Long Term Goals - 09/24/16 1635      PT LONG TERM GOAL #1   Title I with  advanced HEP ( 10/27/16)    Time 6   Period Weeks   Status On-going     PT LONG TERM GOAL #2   Title demo painfree Lt shoulder ROM WFL ( 10/27/16)    Time 6   Period Weeks   Status On-going     PT LONG TERM GOAL #3   Title improve Lt shoulder strength =/> 5-/5 to allow her to perform her work without difficulty ( 10/27/16)    Time 6   Period Weeks   Status On-going     PT LONG TERM GOAL #4   Title improve Lt hip IR/ER to [facility] to allow her ease with donning shoes/socks ( 10/27/16)    Time 6   Period Weeks   Status On-going     PT LONG TERM GOAL #5   Title demo Lt hip strength =/> 5-/5 to assist with improving her ability to walk around at work ( 10/27/16)    Time 6   Period Weeks   Status On-going     PT LONG TERM GOAL #6   Title reports overall improvement of pain by =/> 75% with daily activities ( 10/27/16)    Time 6   Period Weeks   Status On-going     PT LONG TERM GOAL #7   Title improve FOTO =/< 44% limited ( 10/27/16)    Time 6   Period Weeks   Status On-going               Plan - 10/01/16 0929    Clinical Impression Statement Pt has had increase in symptoms due to recent fall; limited tolerance for exercise.  She has trigger points in Lt levator, Lt pec minor; may benefit from continued manual therapy/ trial of DN to address tightness.   No goals met at this time.    PT Frequency 2x / week   PT Duration 6 weeks   PT Treatment/Interventions Cryotherapy;Electrical Stimulation;Iontophoresis 109m/ml Dexamethasone;Moist Heat;Therapeutic exercise;Neuromuscular re-education;Patient/family education;Manual techniques;Dry needling;Vasopneumatic Device;Taping;ADLs/Self Care Home Management   PT Next Visit Plan Trial of DN, manual therapy to Lt shoulder/ Lt hip.  Continue progressive stretching/ strengthening of Lt shoulder/hip.    Consulted and Agree with Plan of Care Patient      Patient will benefit from skilled therapeutic intervention in order to improve the following  deficits and impairments:  Decreased range of motion, Difficulty walking, Impaired UE functional use, Increased muscle spasms, Pain, Impaired flexibility, Decreased strength  Visit Diagnosis: Pain in left hip  Chronic left shoulder pain  Muscle weakness (generalized)  Stiffness of left hip, not elsewhere classified  Stiffness of left shoulder, not elsewhere classified  Other muscle spasm     Problem List Patient Active Problem List   Diagnosis Date Noted  . Left shoulder pain 09/07/2016  . Degenerative tear of acetabular labrum of left hip 07/15/2016  . Ganglion cyst 02/17/2016  . Generalized anxiety disorder 02/17/2016  . Recurrent sinusitis 02/17/2016  . Vitamin D deficiency 02/15/2014  . Radiculitis of left cervical region 11/30/2013  . Left lumbar radiculitis 11/30/2013  . Asthma, chronic 12/14/2012  . GERD (gastroesophageal reflux disease) 12/14/2012  . Seasonal allergies 12/14/2012  . Anxiety and depression 12/14/2012  . Transaminitis 12/14/2012  . Myalgia and myositis 10/30/2012  . Other malaise and fatigue 10/26/2012  . Flushing 10/26/2012  . Insomnia 08/13/2012  . Stress and adjustment reaction 08/13/2012   Kerin Perna, PTA 10/01/16 10:40 AM  [facility] [place], [place], [address] Phone: [phone]   Fax:  [phone]  Name: Aneka Fagerstrom MRN: [id number] Date of Birth: 29-Jun-1980

## 2016-10-05 ENCOUNTER — Encounter: Payer: Self-pay | Admitting: Physical Therapy

## 2016-10-05 ENCOUNTER — Ambulatory Visit (INDEPENDENT_AMBULATORY_CARE_PROVIDER_SITE_OTHER): Payer: 59 | Admitting: Physical Therapy

## 2016-10-05 DIAGNOSIS — M25512 Pain in left shoulder: Secondary | ICD-10-CM

## 2016-10-05 DIAGNOSIS — G8929 Other chronic pain: Secondary | ICD-10-CM | POA: Diagnosis not present

## 2016-10-05 DIAGNOSIS — M62838 Other muscle spasm: Secondary | ICD-10-CM | POA: Diagnosis not present

## 2016-10-05 DIAGNOSIS — M25552 Pain in left hip: Secondary | ICD-10-CM

## 2016-10-05 DIAGNOSIS — M25652 Stiffness of left hip, not elsewhere classified: Secondary | ICD-10-CM | POA: Diagnosis not present

## 2016-10-05 DIAGNOSIS — M25612 Stiffness of left shoulder, not elsewhere classified: Secondary | ICD-10-CM | POA: Diagnosis not present

## 2016-10-05 DIAGNOSIS — M6281 Muscle weakness (generalized): Secondary | ICD-10-CM

## 2016-10-05 NOTE — Therapy (Signed)
[place], [place], [address] Phone: [phone]   Fax:  [phone]  Physical Therapy Treatment  Patient Details  Name: Tracey Patterson MRN: [id number] Date of Birth: 1981/01/05 Referring Provider: Dr. Dianah Field  Encounter Date: 10/05/2016      PT End of Session - 10/05/16 0943    Visit Number 6   Number of Visits 12   Date for PT Re-Evaluation 10/27/16   PT Start Time 0943  pt in late   PT Stop Time 1036   PT Time Calculation (min) 53 min      Past Medical History:  Diagnosis Date  . Anxiety   . Asthma   . History of TMJ disorder     Past Surgical History:  Procedure Laterality Date  . BACK SURGERY  December 2015  . KNEE ARTHROSCOPY  1998   left  . LAPAROSCOPIC TUBAL LIGATION  02/28/2012   Procedure: LAPAROSCOPIC TUBAL LIGATION;  Surgeon: Cyril Mourning, MD;  Location: [facility];  Service: Gynecology;  Laterality: Bilateral;  . TONSILLECTOMY      There were no vitals filed for this visit.      Subjective Assessment - 10/05/16 0944    Subjective Pt with    Patient Stated Goals keep from having surgery. improve and manage her pain.    Currently in Pain? Yes   Pain Score 4    Pain Location Shoulder   Pain Orientation Left   Pain Descriptors / Indicators Aching   Pain Onset More than a month ago   Pain Frequency Intermittent   Aggravating Factors  pushing pulling lifting   Pain Relieving Factors tape some   Pain Score 3   Pain Location Hip   Pain Orientation Left   Pain Descriptors / Indicators Dull   Pain Type Chronic pain   Pain Onset More than a month ago   Pain Frequency Intermittent   Aggravating Factors  walking/squatting   Pain Relieving Factors heat and stretches                         [facility] Adult PT Treatment/Exercise - 10/05/16 0001      Shoulder Exercises: ROM/Strengthening   UBE (Upper Arm Bike) L2 x 4' alt FWD/BWD, increased shoulder  popping with BWD     Modalities   Modalities Electrical Stimulation;Moist Heat     Moist Heat Therapy   Number Minutes Moist Heat 15 Minutes   Moist Heat Location Hip;Shoulder     Electrical Stimulation   Electrical Stimulation Location Lt shoulder complex   Electrical Stimulation Action IFC   Electrical Stimulation Parameters to tolerance   Electrical Stimulation Goals Tone;Pain     Manual Therapy   Manual Therapy Soft tissue mobilization   Soft tissue mobilization STM to Lt levator, upper trap, RTC muscles around the left shoulder.  Very tight posterior shoulder and in levator.           Trigger Point Dry Needling - 10/05/16 0950    Consent Given? Yes   Education Handout Provided Yes   Muscles Treated Upper Body Levator scapulae;Supraspinatus;Infraspinatus;Subscapularis  all left side   Levator Scapulae Response Palpable increased muscle length;Twitch response elicited   Supraspinatus Response Palpable increased muscle length;Twitch response elicited   Infraspinatus Response Palpable increased muscle length;Twitch response elicited   Subscapularis Response Palpable increased muscle length;Twitch response elicited  PT Long Term Goals - 10/05/16 5027      PT LONG TERM GOAL #1   Title I with advanced HEP ( 10/27/16)    Status On-going     PT LONG TERM GOAL #2   Title demo painfree Lt shoulder ROM WFL ( 10/27/16)    Status On-going     PT LONG TERM GOAL #3   Title improve Lt shoulder strength =/> 5-/5 to allow her to perform her work without difficulty ( 10/27/16)    Status On-going     PT LONG TERM GOAL #4   Title improve Lt hip IR/ER to [facility] to allow her ease with donning shoes/socks ( 10/27/16)    Status On-going     PT LONG TERM GOAL #5   Title demo Lt hip strength =/> 5-/5 to assist with improving her ability to walk around at work ( 10/27/16)    Status On-going     PT LONG TERM GOAL #6   Title reports overall improvement of pain by =/>  75% with daily activities ( 10/27/16)    Status On-going     PT LONG TERM GOAL #7   Title improve FOTO =/< 44% limited ( 10/27/16)    Status On-going               Plan - 10/05/16 1303    Clinical Impression Statement Tracey Patterson had significant tightness around the Lt shoulder complex during manaul work and DN.  She had good releases and was able to perform Lt shoulder flexion about 20 degrees more after. She will most likely require more session of this. No new goals met .   Rehab Potential Good   PT Frequency 2x / week   PT Duration 6 weeks   PT Treatment/Interventions Cryotherapy;Electrical Stimulation;Iontophoresis 79m/ml Dexamethasone;Moist Heat;Therapeutic exercise;Neuromuscular re-education;Patient/family education;Manual techniques;Dry needling;Vasopneumatic Device;Taping;ADLs/Self Care Home Management   PT Next Visit Plan assess response to DN and continue.  Add in more scap stab ex. Continue with hip stabilization   Consulted and Agree with Plan of Care Patient      Patient will benefit from skilled therapeutic intervention in order to improve the following deficits and impairments:  Decreased range of motion, Difficulty walking, Impaired UE functional use, Increased muscle spasms, Pain, Impaired flexibility, Decreased strength  Visit Diagnosis: Pain in left hip  Chronic left shoulder pain  Muscle weakness (generalized)  Stiffness of left hip, not elsewhere classified  Stiffness of left shoulder, not elsewhere classified  Other muscle spasm     Problem List Patient Active Problem List   Diagnosis Date Noted  . Left shoulder pain 09/07/2016  . Degenerative tear of acetabular labrum of left hip 07/15/2016  . Ganglion cyst 02/17/2016  . Generalized anxiety disorder 02/17/2016  . Recurrent sinusitis 02/17/2016  . Vitamin D deficiency 02/15/2014  . Radiculitis of left cervical region 11/30/2013  . Left lumbar radiculitis 11/30/2013  . Asthma, chronic 12/14/2012   . GERD (gastroesophageal reflux disease) 12/14/2012  . Seasonal allergies 12/14/2012  . Anxiety and depression 12/14/2012  . Transaminitis 12/14/2012  . Myalgia and myositis 10/30/2012  . Other malaise and fatigue 10/26/2012  . Flushing 10/26/2012  . Insomnia 08/13/2012  . Stress and adjustment reaction 08/13/2012    SJeral PinchPT 10/05/2016, 1:07 PM  [facility] [address]Phone: [phone]  Fax:  [phone] Name: JMarien ManshipMRN: [id number] of Birth: [birth year]

## 2016-10-05 NOTE — Patient Instructions (Signed)

## 2016-10-07 ENCOUNTER — Ambulatory Visit (INDEPENDENT_AMBULATORY_CARE_PROVIDER_SITE_OTHER): Payer: 59 | Admitting: Physical Therapy

## 2016-10-07 DIAGNOSIS — M25512 Pain in left shoulder: Secondary | ICD-10-CM | POA: Diagnosis not present

## 2016-10-07 DIAGNOSIS — G8929 Other chronic pain: Secondary | ICD-10-CM

## 2016-10-07 DIAGNOSIS — M25612 Stiffness of left shoulder, not elsewhere classified: Secondary | ICD-10-CM | POA: Diagnosis not present

## 2016-10-07 DIAGNOSIS — M25552 Pain in left hip: Secondary | ICD-10-CM | POA: Diagnosis not present

## 2016-10-07 DIAGNOSIS — M6281 Muscle weakness (generalized): Secondary | ICD-10-CM | POA: Diagnosis not present

## 2016-10-07 DIAGNOSIS — M25652 Stiffness of left hip, not elsewhere classified: Secondary | ICD-10-CM

## 2016-10-07 MED FILL — NORETHINDRONE 0.35 MG TAB: 0.35 | 84 days supply | Qty: 84 | Fill #1

## 2016-10-07 NOTE — Therapy (Signed)
[place], [place], [address] Phone: [phone]   Fax:  [phone]  Physical Therapy Treatment  Patient Details  Name: Tracey Patterson MRN: [id number] Date of Birth: 1981-01-22 Referring Provider: Dr.Thekkekandam  Encounter Date: 10/07/2016      PT End of Session - 10/07/16 1211    Visit Number 7   Number of Visits 12   Date for PT Re-Evaluation 10/27/16   PT Start Time 1152  pt arrived late   PT Stop Time 1235   PT Time Calculation (min) 43 min      Past Medical History:  Diagnosis Date  . Anxiety   . Asthma   . History of TMJ disorder     Past Surgical History:  Procedure Laterality Date  . BACK SURGERY  December 2015  . KNEE ARTHROSCOPY  1998   left  . LAPAROSCOPIC TUBAL LIGATION  02/28/2012   Procedure: LAPAROSCOPIC TUBAL LIGATION;  Surgeon: Cyril Mourning, MD;  Location: [facility];  Service: Gynecology;  Laterality: Bilateral;  . TONSILLECTOMY      There were no vitals filed for this visit.      Subjective Assessment - 10/07/16 1219    Subjective Tracey Patterson reports she had some soreness the day of DN, but resolved by next day.  She has noticed her motion in Lt shoulder had improved, "it doesn't feel like it is stuck".   Her left hip only hurts with certain motions (initial ER in supine/sitting)    Currently in Pain? Yes   Pain Score 1             [facility] PT Assessment - 10/07/16 0001      Assessment   Medical Diagnosis Lt hip and Lt shoulder degenerative labral changes   Referring Provider Dr.Thekkekandam   Onset Date/Surgical Date 09/16/14   Hand Dominance Right   Next MD Visit 10/19/16     AROM   Left Shoulder Flexion 160 Degrees   Left Shoulder ABduction 158 Degrees          [facility] Adult PT Treatment/Exercise - 10/07/16 0001      Self-Care   Other Self-Care Comments  Pt educated on self MFR to platysma, SCM, pec; pt able to return demo and verbalize  understanding.      Knee/Hip Exercises: Stretches   Piriformis Stretch Right;Left;2 reps;30 seconds   Other Knee/Hip Stretches butterfly stretch, x 10 sec x 3 reps     Knee/Hip Exercises: Aerobic   Nustep L5: arms/legs x 5 min      Knee/Hip Exercises: Sidelying   Hip ABduction Left;1 set;5 reps  LE at 45 deg   Clams LLE x 10 with core engaged, 2 sets, slow speed.      Shoulder Exercises: Standing   External Rotation Strengthening;Both  8 reps, fatigued. back against noodle, 2 sets   Theraband Level (Shoulder External Rotation) Level 2 (Red)   Extension Strengthening;Both;12 reps;Theraband   Theraband Level (Shoulder Extension) Level 2 (Red)   Row Strengthening;Both;Theraband;12 reps  3 sec hold in scap retraction   Theraband Level (Shoulder Row) Level 3 (Green)     Shoulder Exercises: Stretch   Other Shoulder Stretches low and midlevel doorway stretch x 30 sec x 2 reps.     Modalities   Modalities --  pt declined.      Manual Therapy   Manual therapy comments Rock tape applied in an X formation over Lt glute med and Lt levator attachment with 75%  stretch in center of tape, to decrease pain, sensitivity, increase proprioceptive awareness.    Soft tissue mobilization TPR and STM to Lt glute med/min, piriformis.                      PT Long Term Goals - 10/05/16 4193      PT LONG TERM GOAL #1   Title I with advanced HEP ( 10/27/16)    Status On-going     PT LONG TERM GOAL #2   Title demo painfree Lt shoulder ROM WFL ( 10/27/16)    Status On-going     PT LONG TERM GOAL #3   Title improve Lt shoulder strength =/> 5-/5 to allow her to perform her work without difficulty ( 10/27/16)    Status On-going     PT LONG TERM GOAL #4   Title improve Lt hip IR/ER to [facility] to allow her ease with donning shoes/socks ( 10/27/16)    Status On-going     PT LONG TERM GOAL #5   Title demo Lt hip strength =/> 5-/5 to assist with improving her ability to walk around at work (  10/27/16)    Status On-going     PT LONG TERM GOAL #6   Title reports overall improvement of pain by =/> 75% with daily activities ( 10/27/16)    Status On-going     PT LONG TERM GOAL #7   Title improve FOTO =/< 44% limited ( 10/27/16)    Status On-going               Plan - 10/07/16 1310    Clinical Impression Statement Pt had positive response to DN last session, with measurable improvement in ROM.  She may benefit from DN to Lt hip.  She tolerated all exercise well, without increase in pain, just fatigue.  Progressing towards goals.    Rehab Potential Good   PT Frequency 2x / week   PT Duration 6 weeks   PT Treatment/Interventions Cryotherapy;Electrical Stimulation;Iontophoresis 39m/ml Dexamethasone;Moist Heat;Therapeutic exercise;Neuromuscular re-education;Patient/family education;Manual techniques;Dry needling;Vasopneumatic Device;Taping;ADLs/Self Care Home Management   PT Next Visit Plan Trial DN to hip; continue scap stab and hip stabilization   Consulted and Agree with Plan of Care Patient      Patient will benefit from skilled therapeutic intervention in order to improve the following deficits and impairments:  Decreased range of motion, Difficulty walking, Impaired UE functional use, Increased muscle spasms, Pain, Impaired flexibility, Decreased strength  Visit Diagnosis: Pain in left hip  Chronic left shoulder pain  Muscle weakness (generalized)  Stiffness of left hip, not elsewhere classified  Stiffness of left shoulder, not elsewhere classified     Problem List Patient Active Problem List   Diagnosis Date Noted  . Left shoulder pain 09/07/2016  . Degenerative tear of acetabular labrum of left hip 07/15/2016  . Ganglion cyst 02/17/2016  . Generalized anxiety disorder 02/17/2016  . Recurrent sinusitis 02/17/2016  . Vitamin D deficiency 02/15/2014  . Radiculitis of left cervical region 11/30/2013  . Left lumbar radiculitis 11/30/2013  . Asthma, chronic  12/14/2012  . GERD (gastroesophageal reflux disease) 12/14/2012  . Seasonal allergies 12/14/2012  . Anxiety and depression 12/14/2012  . Transaminitis 12/14/2012  . Myalgia and myositis 10/30/2012  . Other malaise and fatigue 10/26/2012  . Flushing 10/26/2012  . Insomnia 08/13/2012  . Stress and adjustment reaction 08/13/2012   JKerin Perna PTA 10/07/16 1:14 PM  [place] [address]  [place], [place], [address] Phone: [phone]   Fax:  [phone]  Name: Tracey Patterson MRN: [id number] Date of Birth: 04/18/1980

## 2016-10-13 ENCOUNTER — Ambulatory Visit (INDEPENDENT_AMBULATORY_CARE_PROVIDER_SITE_OTHER): Payer: 59 | Admitting: Rehabilitative and Restorative Service Providers"

## 2016-10-13 ENCOUNTER — Encounter: Payer: Self-pay | Admitting: Rehabilitative and Restorative Service Providers"

## 2016-10-13 DIAGNOSIS — M25552 Pain in left hip: Secondary | ICD-10-CM | POA: Diagnosis not present

## 2016-10-13 DIAGNOSIS — M62838 Other muscle spasm: Secondary | ICD-10-CM | POA: Diagnosis not present

## 2016-10-13 DIAGNOSIS — M25512 Pain in left shoulder: Secondary | ICD-10-CM

## 2016-10-13 DIAGNOSIS — G8929 Other chronic pain: Secondary | ICD-10-CM

## 2016-10-13 DIAGNOSIS — M25652 Stiffness of left hip, not elsewhere classified: Secondary | ICD-10-CM

## 2016-10-13 DIAGNOSIS — M6281 Muscle weakness (generalized): Secondary | ICD-10-CM | POA: Diagnosis not present

## 2016-10-13 DIAGNOSIS — H5213 Myopia, bilateral: Secondary | ICD-10-CM | POA: Diagnosis not present

## 2016-10-13 DIAGNOSIS — M25612 Stiffness of left shoulder, not elsewhere classified: Secondary | ICD-10-CM | POA: Diagnosis not present

## 2016-10-13 NOTE — Therapy (Signed)
[place], [place], [address] Phone: [phone]   Fax:  [phone]  Physical Therapy Treatment  Patient Details  Name: Tracey Patterson MRN: [id number] Date of Birth: 07-16-1980 Referring Provider: Dr Dianah Field   Encounter Date: 10/13/2016      PT End of Session - 10/13/16 0933    Visit Number 8   Number of Visits 12   Date for PT Re-Evaluation 10/27/16   PT Start Time 0931   PT Stop Time 1033   PT Time Calculation (min) 62 min   Activity Tolerance Patient tolerated treatment well      Past Medical History:  Diagnosis Date  . Anxiety   . Asthma   . History of TMJ disorder     Past Surgical History:  Procedure Laterality Date  . BACK SURGERY  December 2015  . KNEE ARTHROSCOPY  1998   left  . LAPAROSCOPIC TUBAL LIGATION  02/28/2012   Procedure: LAPAROSCOPIC TUBAL LIGATION;  Surgeon: Cyril Mourning, MD;  Location: [facility];  Service: Gynecology;  Laterality: Bilateral;  . TONSILLECTOMY      There were no vitals filed for this visit.      Subjective Assessment - 10/13/16 0934    Subjective Tracey Patterson reports that her shoulder feels tight and tired today. Hip is tighter than the shoulder today. She is working on the exercises at home.    Currently in Pain? Yes   Pain Score 2   occasional sharp shooting    Pain Location Shoulder   Pain Orientation Left   Pain Descriptors / Indicators Aching;Nagging;Tiring   Pain Onset More than a month ago   Pain Frequency Intermittent   Pain Score 4   Pain Location Hip   Pain Orientation Left   Pain Descriptors / Indicators Dull;Aching  occasional sharp stabbing pain    Pain Type Chronic pain   Pain Radiating Towards posterior buttock down posterior thigh    Pain Onset More than a month ago   Aggravating Factors  walking/squatting    Pain Relieving Factors heat and stretching             [facility] PT Assessment - 10/13/16 0001      Assessment   Medical Diagnosis Lt hip and Lt shoulder degenerative labral changes   Referring Provider Dr Dianah Field    Onset Date/Surgical Date 09/16/14   Hand Dominance Right   Next MD Visit 10/19/16     [facility]   Right/Left Shoulder --  assessed in standing    Left Shoulder Extension 47 Degrees   Left Shoulder Flexion 137 Degrees   Left Shoulder ABduction 120 Degrees   Left Shoulder Internal Rotation 37 Degrees   Left Shoulder External Rotation 66 Degrees                     [facility] Adult PT Treatment/Exercise - 10/13/16 0001      Self-Care   Other Self-Care Comments  self massage and passive stretch pecs      Neuro Re-ed    Neuro Re-ed Details  initiated postural education/correction      Knee/Hip Exercises: Stretches   Piriformis Stretch Right;Left;2 reps;30 seconds  supine travell      Shoulder Exercises: Supine   Other Supine Exercises prolonged snow angel at ~ 90 deg abduction ~ 2-3 min      Shoulder Exercises: Standing   Other Standing Exercises scap squeeze with noodle 10 sec x 10  Shoulder Exercises: Stretch   Other Shoulder Stretches low and midlevel doorway stretch x 30 sec x 2 reps; added higher stretch for pec minor to pt tolerance 30 sec x 2      Moist Heat Therapy   Number Minutes Moist Heat 20 Minutes   Moist Heat Location Hip;Shoulder  Lt      Theme park manager Lt shoudler anterior/posterior    Chartered certified accountant IFC   Electrical Stimulation Parameters to tolerance   Electrical Stimulation Goals Tone;Pain     Manual Therapy   Manual therapy comments pt prone and supine    Soft tissue mobilization deep tissue work through Psychologist, counselling minor; anterior shoulder/biceps; upper trap/leveator/periscap musculature; piriformis/hip abductors    Myofascial Release anterior shoulder; posterior hip           Trigger Point Dry Needling - 10/13/16 1243    Consent Given? Yes   Muscles  Treated Upper Body --  Lt side with estim posterior shoudler and hip    Upper Trapezius Response Twitch reponse elicited;Palpable increased muscle length   Pectoralis Major Response Palpable increased muscle length   Pectoralis Minor Response Twitch response elicited;Palpable increased muscle length   Levator Scapulae Response Palpable increased muscle length   Rhomboids Response Palpable increased muscle length   Longissimus Response Palpable increased muscle length  lower cervical/upper thoracic    Gluteus Maximus Response Palpable increased muscle length   Gluteus Minimus Response Palpable increased muscle length   Piriformis Response Twitch response elicited;Palpable increased muscle length              PT Education - 10/13/16 1245    Education provided Yes   Education Details HEP pec stretch higher position; prolonged snow angel; piriformis stretch; scap squeeze    Person(s) Educated Patient   Methods Explanation;Demonstration;Tactile cues;Verbal cues   Comprehension Verbalized understanding;Returned demonstration;Verbal cues required;Tactile cues required             PT Long Term Goals - 10/13/16 0933      PT LONG TERM GOAL #1   Title I with advanced HEP ( 10/27/16)    Time 6   Period Weeks   Status On-going     PT LONG TERM GOAL #2   Title demo painfree Lt shoulder ROM WFL ( 10/27/16)    Time 6   Period Weeks   Status On-going     PT LONG TERM GOAL #3   Title improve Lt shoulder strength =/> 5-/5 to allow her to perform her work without difficulty ( 10/27/16)    Time 6   Period Weeks   Status On-going     PT LONG TERM GOAL #4   Title improve Lt hip IR/ER to [facility] to allow her ease with donning shoes/socks ( 10/27/16)    Time 6   Period Weeks   Status On-going     PT LONG TERM GOAL #5   Title demo Lt hip strength =/> 5-/5 to assist with improving her ability to walk around at work ( 10/27/16)    Time 6   Period Weeks   Status On-going     PT LONG TERM GOAL  #6   Title reports overall improvement of pain by =/> 75% with daily activities ( 10/27/16)    Time 6   Period Weeks   Status On-going     PT LONG TERM GOAL #7   Title improve FOTO =/< 44% limited ( 10/27/16)    Time  6   Period Weeks   Status On-going               Plan - 10/13/16 1246    Clinical Impression Statement Continued poor posture and alignment; limited mobility and decresed tissue extensibility with tightness noted through pecs; biceps; piriformis; glut med on Lt specifically. Responded well to manual work and DN followed by modalities. Did well with stretching. Needs continued work to correct posture.     Rehab Potential Good   PT Frequency 2x / week   PT Duration 6 weeks   PT Treatment/Interventions Cryotherapy;Electrical Stimulation;Iontophoresis 50m/ml Dexamethasone;Moist Heat;Therapeutic exercise;Neuromuscular re-education;Patient/family education;Manual techniques;Dry needling;Vasopneumatic Device;Taping;ADLs/Self Care Home Management   PT Next Visit Plan assess response to DN to shd/hip; continue postural correction; stretching; scap stab and hip stabilization   Consulted and Agree with Plan of Care Patient      Patient will benefit from skilled therapeutic intervention in order to improve the following deficits and impairments:  Decreased range of motion, Difficulty walking, Impaired UE functional use, Increased muscle spasms, Pain, Impaired flexibility, Decreased strength, Improper body mechanics, Postural dysfunction  Visit Diagnosis: Pain in left hip  Chronic left shoulder pain  Muscle weakness (generalized)  Stiffness of left hip, not elsewhere classified  Stiffness of left shoulder, not elsewhere classified  Other muscle spasm     Problem List Patient Active Problem List   Diagnosis Date Noted  . Left shoulder pain 09/07/2016  . Degenerative tear of acetabular labrum of left hip 07/15/2016  . Ganglion cyst 02/17/2016  . Generalized anxiety  disorder 02/17/2016  . Recurrent sinusitis 02/17/2016  . Vitamin D deficiency 02/15/2014  . Radiculitis of left cervical region 11/30/2013  . Left lumbar radiculitis 11/30/2013  . Asthma, chronic 12/14/2012  . GERD (gastroesophageal reflux disease) 12/14/2012  . Seasonal allergies 12/14/2012  . Anxiety and depression 12/14/2012  . Transaminitis 12/14/2012  . Myalgia and myositis 10/30/2012  . Other malaise and fatigue 10/26/2012  . Flushing 10/26/2012  . Insomnia 08/13/2012  . Stress and adjustment reaction 08/13/2012    Peri Kreft PNilda SimmerPT, MPH  10/13/2016, 12:50 PM  [id number] [place] [address]Phone: [phone]  Fax:  [phone] Name: JMerridy PascoeMRN: [id number] of Birth: 2Dec 19, 1982

## 2016-10-15 ENCOUNTER — Encounter: Payer: Self-pay | Admitting: Rehabilitative and Restorative Service Providers"

## 2016-10-15 ENCOUNTER — Ambulatory Visit (INDEPENDENT_AMBULATORY_CARE_PROVIDER_SITE_OTHER): Payer: 59 | Admitting: Rehabilitative and Restorative Service Providers"

## 2016-10-15 DIAGNOSIS — M25552 Pain in left hip: Secondary | ICD-10-CM | POA: Diagnosis not present

## 2016-10-15 DIAGNOSIS — G8929 Other chronic pain: Secondary | ICD-10-CM

## 2016-10-15 DIAGNOSIS — M25652 Stiffness of left hip, not elsewhere classified: Secondary | ICD-10-CM

## 2016-10-15 DIAGNOSIS — M25612 Stiffness of left shoulder, not elsewhere classified: Secondary | ICD-10-CM | POA: Diagnosis not present

## 2016-10-15 DIAGNOSIS — M25512 Pain in left shoulder: Secondary | ICD-10-CM | POA: Diagnosis not present

## 2016-10-15 DIAGNOSIS — M62838 Other muscle spasm: Secondary | ICD-10-CM | POA: Diagnosis not present

## 2016-10-15 DIAGNOSIS — M6281 Muscle weakness (generalized): Secondary | ICD-10-CM | POA: Diagnosis not present

## 2016-10-15 NOTE — Patient Instructions (Addendum)
Resisted External Rotation: in Neutral - Bilateral   PALMS UP Sit or stand, tubing in both hands, elbows at sides, bent to 90, forearms forward. Pinch shoulder blades together and rotate forearms out. Keep elbows at sides. Repeat __10__ times per set. Do _2-3___ sets per session. Do _2-3___ sessions per day.   Scapular Retraction: Elbow Flexion (Standing)    With elbows bent to 90, pinch shoulder blades together and rotate arms out, keeping elbows bent. Repeat ___10_ times per set. Do __1-2__ sets per session. Do _several ___ sessions per day.   Neurovascular: Median Nerve Stretch - Supine    Lie with neck supported, side-bent away from moving arm. Hold right arm out to side, elbow bent, thumb down, fingers and wrist bent back. Slowly straighten elbowas far as possible without pain. Hold for _1 minute; 2 reps 2 times/day  No pain - just a stretch    Abdominal Bracing With Pelvic Floor (Hook-Lying)    With neutral spine, tighten pelvic floor and abdominals. Hold 10 sec Repeat _10__ times. Do _several__ times a day. Progress to do this in sitting standing and with functional activities    Strengthening: Hip Extension - Resisted    With tubing around right ankle, face anchor and pull leg straight back. Repeat _10___ times per set. Do __1-3__ sets per session. Do _1-2___ sessions per day.    Strengthening: Hip Abduction - Resisted    With tubing around right leg, other side toward anchor, extend leg out from side. Repeat __10__ times per set. Do __1-3__ sets per session. Do _1-2_ sessions per day.

## 2016-10-15 NOTE — Therapy (Addendum)
[facility] [place] [employer] [place], [place], [address] Phone: [phone]   Fax:  [phone]  Physical Therapy Treatment  Patient Details  Name: Tracey Patterson MRN: [id number] Date of Birth: February 06, 1981 Referring Provider: Dr Dianah Field   Encounter Date: 10/15/2016      PT End of Session - 10/15/16 0934    Visit Number 9   Number of Visits 12   Date for PT Re-Evaluation 10/27/16   PT Start Time 0934   PT Stop Time 1024   PT Time Calculation (min) 50 min   Activity Tolerance Patient tolerated treatment well      Past Medical History:  Diagnosis Date  . Anxiety   . Asthma   . History of TMJ disorder     Past Surgical History:  Procedure Laterality Date  . BACK SURGERY  December 2015  . KNEE ARTHROSCOPY  1998   left  . LAPAROSCOPIC TUBAL LIGATION  02/28/2012   Procedure: LAPAROSCOPIC TUBAL LIGATION;  Surgeon: Cyril Mourning, MD;  Location: [facility];  Service: Gynecology;  Laterality: Bilateral;  . TONSILLECTOMY      There were no vitals filed for this visit.      Subjective Assessment - 10/15/16 0935    Subjective Tracey Patterson reports that her shoulder feels tight and tired today - crunchy. Hip is tightas well - notices the hip when she has to put her socks or shoes on as well as when she is standing/working. She is working on the exercises at home. Sore for a couple of days. Not sure it helped. 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    Currently in Pain? Yes   Pain Score 4    Pain Orientation Left   Pain Descriptors / Indicators Aching;Nagging;Tiring   Pain Type Chronic pain   Pain Score 1  5/10 with movement    Pain Location Hip   Pain Orientation Left   Pain Descriptors / Indicators Dull;Aching                         [facility] Adult PT Treatment/Exercise - 10/15/16 0001      Knee/Hip Exercises: Stretches   Piriformis Stretch Right;Left;2 reps;30 seconds  supine travell      Knee/Hip  Exercises: Standing   Hip Abduction Stengthening;Right;Left;10 reps   Hip Extension Stengthening;Right;Left;10 reps     Knee/Hip Exercises: Seated   Sit to Sand 2 sets;5 reps     Knee/Hip Exercises: Supine   Other Supine Knee/Hip Exercises supine clam shell with green band x 10, core engaged.  Then unilateral clam x 5 reps each leg.    Other Supine Knee/Hip Exercises 3 part core 10 sec x 10      Shoulder Exercises: Standing   Extension Strengthening;Both;Theraband;15 reps   Theraband Level (Shoulder Extension) Level 2 (Red)   Row Strengthening;Both;Theraband;15 reps   Theraband Level (Shoulder Row) Level 3 (Green)   Retraction Strengthening;Both;Theraband;20 reps   Theraband Level (Shoulder Retraction) Level 1 (Yellow)   Other Standing Exercises scap squeeze with noodle 10 sec x 10      Shoulder Exercises: ROM/Strengthening   UBE (Upper Arm Bike) L2 x 4' alt FWD/BWD     Shoulder Exercises: Stretch   Other Shoulder Stretches low and midlevel doorway stretch x 30 sec x 2 reps; added higher stretch for pec minor to pt tolerance 30 sec x 2      Electrical Stimulation   Electrical Stimulation Location  Lt shoudler anterior/posterior; Lt posterior hip    Electrical Stimulation Action IFC and TENS    Electrical Stimulation Parameters to tolerance   Electrical Stimulation Goals Tone;Pain                PT Education - 10/15/16 0950    Education provided Yes   Education Details HEP    Person(s) Educated Patient   Methods Explanation;Demonstration;Tactile cues;Verbal cues;Handout   Comprehension Verbalized understanding;Returned demonstration;Verbal cues required;Tactile cues required             PT Long Term Goals - 10/13/16 0933      PT LONG TERM GOAL #1   Title I with advanced HEP ( 10/27/16)    Time 6   Period Weeks   Status On-going     PT LONG TERM GOAL #2   Title demo painfree Lt shoulder ROM WFL ( 10/27/16)    Time 6   Period Weeks   Status On-going      PT LONG TERM GOAL #3   Title improve Lt shoulder strength =/> 5-/5 to allow her to perform her work without difficulty ( 10/27/16)    Time 6   Period Weeks   Status On-going     PT LONG TERM GOAL #4   Title improve Lt hip IR/ER to [facility] to allow her ease with donning shoes/socks ( 10/27/16)    Time 6   Period Weeks   Status On-going     PT LONG TERM GOAL #5   Title demo Lt hip strength =/> 5-/5 to assist with improving her ability to walk around at work ( 10/27/16)    Time 6   Period Weeks   Status On-going     PT LONG TERM GOAL #6   Title reports overall improvement of pain by =/> 75% with daily activities ( 10/27/16)    Time 6   Period Weeks   Status On-going     PT LONG TERM GOAL #7   Title improve FOTO =/< 44% limited ( 10/27/16)    Time 6   Period Weeks   Status On-going               Plan - 10/15/16 9470    Clinical Impression Statement Persistent pain in the shoulder and hip. Poor posture and body mechanics contribute significantly to dysfunction. Continued tightness noted through soft tissue.    PT Frequency 2x / week   PT Duration 6 weeks   PT Treatment/Interventions Cryotherapy;Electrical Stimulation;Iontophoresis 48m/ml Dexamethasone;Moist Heat;Therapeutic exercise;Neuromuscular re-education;Patient/family education;Manual techniques;Dry needling;Vasopneumatic Device;Taping;ADLs/Self Care Home Management   PT Next Visit Plan continue DN to shd/hip as indicated; continue postural correction; stretching; scap stab and hip stabilization   Consulted and Agree with Plan of Care Patient      Patient will benefit from skilled therapeutic intervention in order to improve the following deficits and impairments:  Decreased range of motion, Difficulty walking, Impaired UE functional use, Increased muscle spasms, Pain, Impaired flexibility, Decreased strength, Improper body mechanics, Postural dysfunction  Visit Diagnosis: Pain in left hip  Chronic left shoulder  pain  Muscle weakness (generalized)  Stiffness of left hip, not elsewhere classified  Stiffness of left shoulder, not elsewhere classified  Other muscle spasm     Problem List Patient Active Problem List   Diagnosis Date Noted  . Left shoulder pain 09/07/2016  . Degenerative tear of acetabular labrum of left hip 07/15/2016  . Ganglion cyst 02/17/2016  . Generalized anxiety disorder 02/17/2016  . Recurrent  sinusitis 02/17/2016  . Vitamin D deficiency 02/15/2014  . Radiculitis of left cervical region 11/30/2013  . Left lumbar radiculitis 11/30/2013  . Asthma, chronic 12/14/2012  . GERD (gastroesophageal reflux disease) 12/14/2012  . Seasonal allergies 12/14/2012  . Anxiety and depression 12/14/2012  . Transaminitis 12/14/2012  . Myalgia and myositis 10/30/2012  . Other malaise and fatigue 10/26/2012  . Flushing 10/26/2012  . Insomnia 08/13/2012  . Stress and adjustment reaction 08/13/2012    Celyn Nilda Simmer PT, MPH  10/15/2016, 10:18 AM  [facility] [place], [place], [address] Phone: [phone]   Fax:  [phone]  Name: Tracey Patterson MRN: [id number] Date of Birth: [birth year]   PHYSICAL THERAPY DISCHARGE SUMMARY  Visits from Start of Care: 9 Current functional level related to goals / functional outcomes: unknown   Remaining deficits: unknown   Education / Equipment: HEP Plan: Patient agrees to discharge.  Patient goals were not met. Patient is being discharged due to the patient's request.  ?????Pt requested to be on hold for further assessment.  She has not returned for treatment.      Jeral Pinch, PT 11/30/16 12:33 PM

## 2016-10-18 MED FILL — PANTOPRAZOLE SOD DR 40 MG T: 40 | 90 days supply | Qty: 90 | Fill #2

## 2016-10-19 ENCOUNTER — Encounter: Payer: Self-pay | Admitting: Sports Medicine

## 2016-10-19 ENCOUNTER — Ambulatory Visit (INDEPENDENT_AMBULATORY_CARE_PROVIDER_SITE_OTHER): Payer: 59 | Admitting: Sports Medicine

## 2016-10-19 DIAGNOSIS — G8929 Other chronic pain: Secondary | ICD-10-CM

## 2016-10-19 DIAGNOSIS — M1612 Unilateral primary osteoarthritis, left hip: Secondary | ICD-10-CM | POA: Diagnosis not present

## 2016-10-19 DIAGNOSIS — M25512 Pain in left shoulder: Secondary | ICD-10-CM

## 2016-10-19 DIAGNOSIS — M24152 Other articular cartilage disorders, left hip: Secondary | ICD-10-CM

## 2016-10-19 MED ORDER — CYCLOBENZAPRINE HCL 10 MG PO TABS: ORAL_TABLET | ORAL | 0 refills | Status: DC

## 2016-10-19 MED FILL — CYCLOBENZAPRINE 10 MG TABLE: 10 | 10 days supply | Qty: 30 | Fill #0

## 2016-10-19 NOTE — Progress Notes (Signed)
  Subjective:    CC: Follow-up  HPI: Left hip labral tear: Only intermittent pain, improve significantly with physical therapy.  Left lateral hip pain: Had a fall several weeks ago, now has persistent pain on the left lateral hip. Improving.  Left shoulder pain: Clinical signs were consistent with labral injury, also improving with physical therapy but still has significant pain and mechanical symptoms.  Past medical history:  Negative.  See flowsheet/record as well for more information.  Surgical history: Negative.  See flowsheet/record as well for more information.  Family history: Negative.  See flowsheet/record as well for more information.  Social history: Negative.  See flowsheet/record as well for more information.  Allergies, and medications have been entered into the medical record, reviewed, and no changes needed.   Review of Systems: No fevers, chills, night sweats, weight loss, chest pain, or shortness of breath.   Objective:    General: Well Developed, well nourished, and in no acute distress.  Neuro: Alert and oriented x3, extra-ocular muscles intact, sensation grossly intact.  HEENT: Normocephalic, atraumatic, pupils equal round reactive to light, neck supple, no masses, no lymphadenopathy, thyroid nonpalpable.  Skin: Warm and dry, no rashes. Cardiac: Regular rate and rhythm, no murmurs rubs or gallops, no lower extremity edema.  Respiratory: Clear to auscultation bilaterally. Not using accessory muscles, speaking in full sentences. Left Hip: ROM IR: 60 Deg, ER: 60 Deg, Flexion: 120 Deg, Extension: 100 Deg, Abduction: 45 Deg, Adduction: 45 Deg Strength IR: 5/5, ER: 5/5, Flexion: 5/5, Extension: 5/5, Abduction: 5/5, Adduction: 5/5 Pelvic alignment unremarkable to inspection and palpation. Standing hip rotation and gait without trendelenburg / unsteadiness. Greater trochanter with tenderness to palpation. No tenderness over piriformis. No SI joint tenderness and  normal minimal SI movement.  Impression and Recommendations:    Degenerative tear of acetabular labrum of left hip Improved, does have a bit of bursa type pain today after a fall, adding home rehabilitation exercises. At this point she does say that she can live with her labral type pain in the groin.  Left shoulder pain MRI arthrogram, suspect labral tear, positive O'Brien's test, clunk test, crank test with some translational instability. She also desires a muscle relaxer for some trapezial type pain. Return for arthrogram injection.  I spent 25 minutes with this patient, greater than 50% was face-to-face time counseling regarding the above diagnoses

## 2016-10-19 NOTE — Assessment & Plan Note (Addendum)
MRI arthrogram, suspect labral tear, positive O'Brien's test, clunk test, crank test with some translational instability. She also desires a muscle relaxer for some trapezial type pain. Return for arthrogram injection.  MRI was surprisingly negative, adding aggressive formal physical therapy, this likely represents more of a multidirectional instability.

## 2016-10-19 NOTE — Assessment & Plan Note (Signed)
Improved, does have a bit of bursa type pain today after a fall, adding home rehabilitation exercises. At this point she does say that she can live with her labral type pain in the groin.

## 2016-10-25 ENCOUNTER — Ambulatory Visit (INDEPENDENT_AMBULATORY_CARE_PROVIDER_SITE_OTHER): Payer: 59

## 2016-10-25 ENCOUNTER — Encounter: Payer: Self-pay | Admitting: Sports Medicine

## 2016-10-25 ENCOUNTER — Ambulatory Visit (INDEPENDENT_AMBULATORY_CARE_PROVIDER_SITE_OTHER): Payer: 59 | Admitting: Sports Medicine

## 2016-10-25 DIAGNOSIS — M25512 Pain in left shoulder: Secondary | ICD-10-CM | POA: Diagnosis not present

## 2016-10-25 DIAGNOSIS — G8929 Other chronic pain: Secondary | ICD-10-CM | POA: Diagnosis not present

## 2016-10-25 MED ORDER — GADOBENATE DIMEGLUMINE 529 MG/ML IV SOLN
5.0000 mL | Freq: Once | INTRAVENOUS | Status: AC | PRN
  Administered 2016-10-25: 0.01 mL via INTRAVENOUS

## 2016-10-25 NOTE — Progress Notes (Signed)
  Procedure: Real-time Ultrasound Guided gadolinium contrast injection of left shoulder Device: GE Logiq E  Verbal informed consent obtained.  Time-out conducted.  Noted no overlying erythema, induration, or other signs of local infection.  Skin prepped in a sterile fashion.  Local anesthesia: Topical Ethyl chloride.  With sterile technique and under real time ultrasound guidance:  Using a 22-gauge spinal needle advanced into the joint taking care to avoid the labrum, I then injected 1 mL kenalog 40, 2 mL lidocaine, 2 mL bupivacaine, syringe switched and 0.1 mL [place] him injected, syringe again switched and 10 mL sterile saline used flush the needle. Joint visualized and capsule seen distending confirming intra-articular placement of contrast material and medication. Completed without difficulty  Advised to call if fevers/chills, erythema, induration, drainage, or persistent bleeding.  Images permanently stored and available for review in the ultrasound unit.  Impression: Technically successful ultrasound guided gadolinium contrast injection for MR arthrography.  Please see separate MR arthrogram report.

## 2016-10-25 NOTE — Assessment & Plan Note (Signed)
With a positive O'Brien's, clunk, crank test with some translational instability we did suspect a labral tear. Arthrogram injection for MRI, we will follow up with the results.

## 2016-10-26 NOTE — Addendum Note (Signed)
Addended by: Silverio Decamp on: 10/26/2016 10:09 AM   Modules accepted: Orders

## 2016-12-02 ENCOUNTER — Ambulatory Visit (INDEPENDENT_AMBULATORY_CARE_PROVIDER_SITE_OTHER): Payer: 59 | Admitting: Osteopathic Medicine

## 2016-12-02 ENCOUNTER — Encounter: Payer: Self-pay | Admitting: Osteopathic Medicine

## 2016-12-02 VITALS — BP 117/81 | HR 92 | Ht 65.5 in | Wt 182.0 lb

## 2016-12-02 DIAGNOSIS — F419 Anxiety disorder, unspecified: Secondary | ICD-10-CM

## 2016-12-02 DIAGNOSIS — G4700 Insomnia, unspecified: Secondary | ICD-10-CM

## 2016-12-02 DIAGNOSIS — Z0000 Encounter for general adult medical examination without abnormal findings: Secondary | ICD-10-CM

## 2016-12-02 DIAGNOSIS — F329 Major depressive disorder, single episode, unspecified: Secondary | ICD-10-CM

## 2016-12-02 DIAGNOSIS — F411 Generalized anxiety disorder: Secondary | ICD-10-CM | POA: Diagnosis not present

## 2016-12-02 DIAGNOSIS — K219 Gastro-esophageal reflux disease without esophagitis: Secondary | ICD-10-CM | POA: Diagnosis not present

## 2016-12-02 DIAGNOSIS — G43109 Migraine with aura, not intractable, without status migrainosus: Secondary | ICD-10-CM

## 2016-12-02 DIAGNOSIS — F32A Depression, unspecified: Secondary | ICD-10-CM

## 2016-12-02 MED ORDER — PANTOPRAZOLE SODIUM 40 MG PO TBEC: 40.0000 mg | DELAYED_RELEASE_TABLET | Freq: Every day | ORAL | 3 refills | Status: DC

## 2016-12-02 MED ORDER — CLONAZEPAM 0.5 MG PO TABS: ORAL_TABLET | ORAL | 1 refills | Status: DC

## 2016-12-02 MED ORDER — ESZOPICLONE 2 MG PO TABS: 1.0000 mg | ORAL_TABLET | Freq: Every evening | ORAL | 1 refills | Status: DC | PRN

## 2016-12-02 MED ORDER — BUPROPION HCL ER (XL) 300 MG PO TB24: 300.0000 mg | ORAL_TABLET | Freq: Every day | ORAL | 3 refills | Status: DC

## 2016-12-02 MED FILL — PREVIDENT 5000 BOOSTER PLUS: 1.1 | 30 days supply | Qty: 100 | Fill #0

## 2016-12-02 MED FILL — buPROPion HCL ER (XL) 300 M: 300 | 90 days supply | Qty: 90 | Fill #0

## 2016-12-02 MED FILL — ESZOPICLONE 2 MG TABLET: 2 | 30 days supply | Qty: 30 | Fill #0

## 2016-12-02 NOTE — Progress Notes (Signed)
HPI: Tracey Patterson is a 36 y.o. female  who presents to [place] today, 12/02/16,  for chief complaint of:  Chief Complaint  Patient presents with  . Follow-up    MEDICATION REFILLS    Migraines: Patient last seen here 06/2016 about 5 months ago, at that point migraines were bothering her. We initiated Imitrex to use as needed with discussion for follow-up if this wasn't helping or if wanted to consider prophylactic medications.  Insomnia: Ambien sometimes works, sometimes not, Occasionally causes daytime drowsiness. 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 She has been on several different medications in the past and this seems to do the best for her as noted below. Sparing use of clonazepam  GERD: Stable on current medications, requests refill  Past medical history, surgical history, social history and family history reviewed.  Patient Active Problem List   Diagnosis Date Noted  . Left shoulder pain 09/07/2016  . Degenerative tear of acetabular labrum of left hip 07/15/2016  . Ganglion cyst 02/17/2016  . Generalized anxiety disorder 02/17/2016  . Recurrent sinusitis 02/17/2016  . Vitamin D deficiency 02/15/2014  . Radiculitis of left cervical region 11/30/2013  . Left lumbar radiculitis 11/30/2013  . Asthma, chronic 12/14/2012  . GERD (gastroesophageal reflux disease) 12/14/2012  . Seasonal allergies 12/14/2012  . Anxiety and depression 12/14/2012  . Transaminitis 12/14/2012  . Myalgia and myositis 10/30/2012  . Other malaise and fatigue 10/26/2012  . Flushing 10/26/2012  . Insomnia 08/13/2012  . Stress and adjustment reaction 08/13/2012    Current medication list and allergy/intolerance information reviewed.   Current Outpatient Prescriptions on File Prior to Visit  Medication Sig Dispense Refill   . Acetaminophen (TYLENOL ARTHRITIS PAIN PO) Take by mouth.    Marland Kitchen albuterol (PROVENTIL HFA;VENTOLIN HFA) 108 (90 BASE) MCG/ACT inhaler Inhale 2 puffs into the lungs every 4 (four) hours as needed for wheezing (cough, shortness of breath or wheezing.). 1 Inhaler 1  . buPROPion (WELLBUTRIN XL) 300 MG 24 hr tablet Take 1 tablet (300 mg total) by mouth daily. 90 tablet 1  . cetirizine (ZYRTEC) 10 MG tablet Take 1 tablet (10 mg total) by mouth daily. 30 tablet 3  . clonazePAM (KLONOPIN) 0.5 MG tablet Half to full tab daily as needed for anxiety. 90 tablet 1  . cyclobenzaprine (FLEXERIL) 10 MG tablet One half tab PO qHS, then increase gradually to one tab TID. 30 tablet 0  . diclofenac (VOLTAREN) 75 MG EC tablet Take 1 tablet (75 mg total) by mouth 2 (two) times daily as needed. 30 tablet 3  . fluticasone (FLONASE) 50 MCG/ACT nasal spray Place 2 sprays into both nostrils daily. 16 g 6  . IBUPROFEN PO Take 800 mg by mouth as needed.     . Multiple Vitamin (MULTIVITAMIN) tablet Take 1 tablet by mouth daily.    . norethindrone (CAMILA) 0.35 MG tablet One by mouth daily. 3 Package 2  . pantoprazole (PROTONIX) 40 MG tablet Take 1 tablet (40 mg total) by mouth daily. 90 tablet 3  . SUMAtriptan (IMITREX) 50 MG tablet Take 1-2 tablets (50-100 mg total) by mouth once as needed for migraine. May repeat in 2 hours if headache persists or recurs. Limit use to 200 mg (4 tablets) per 24 hours, use no more than 1-2 times per week 30 tablet 0  . traMADol (  ULTRAM) 50 MG tablet Take 1 tablet (50 mg total) by mouth every 8 (eight) hours as needed. 30 tablet 0  . zolpidem (AMBIEN) 10 MG tablet TAKE 1/2 TO 1 TABLET BY MOUTH AT NIGHT FOR UP TO 8 HOURS OF SLEEP APPOINTMENT NEEDED FOR FURTHER REFILLS 30 tablet 0   No current facility-administered medications on file prior to visit.    Allergies  Allergen Reactions  . Crab [Shellfish Allergy] Other (See Comments)    Allergist advises against eating crab.  . Sulfa  Antibiotics Hives  . Zoloft [Sertraline Hcl]     Hair loss  . Other Rash    Dermabond, steri strips      Review of Systems:  Constitutional: No recent illness  HEENT: No  headache, no vision change   Cardiac: No  chest pain, No  pressure, No palpitations  Respiratory:  No  shortness of breath. No  Cough  Gastrointestinal: No  abdominal pain  Musculoskeletal: No new myalgia/arthralgia  Skin: No  Rash  Neurologic: No  weakness, No  Dizziness  Psychiatric: No  concerns with depression, +concerns with anxiety, +insomnia    Exam:  BP 117/81   Pulse 92   Ht 5' 5.5" (1.664 m)   Wt 182 lb (82.6 kg)   BMI 29.83 kg/m   Constitutional: VS see above. General Appearance: alert, well-developed, well-nourished, NAD  Eyes: Normal lids and conjunctive, non-icteric sclera  Ears, Nose, Mouth, Throat: MMM, Normal external inspection ears/nares/mouth/lips/gums.  Neck: No masses, trachea midline.   Respiratory: Normal respiratory effort. no wheeze, no rhonchi, no rales  Cardiovascular: S1/S2 normal, no murmur, no rub/gallop auscultated. RRR.   Musculoskeletal: Gait normal. Symmetric and independent movement of all extremities  Neurological: Normal balance/coordination. No tremor.  Skin: warm, dry, intact.   Psychiatric: Normal judgment/insight. Normal mood and affect. Oriented x3.       Depression screen [facility] 2/9 12/02/2016 03/24/2016 02/17/2016  Decreased Interest 0 1 1  Down, Depressed, Hopeless 1 1 1   PHQ - 2 Score 1 2 2   Altered sleeping 0 2 2  Tired, decreased energy 0 2 3  Change in appetite 0 3 1  Feeling bad or failure about yourself  0 2 1  Trouble concentrating 0 1 2  Moving slowly or fidgety/restless 0 1 0  Suicidal thoughts 0 0 0  PHQ-9 Score 1 13 11   Some encounter information is confidential and restricted. Go to Review Flowsheets activity to see all data.      ASSESSMENT/PLAN:   Insomnia, unspecified type - Trial switch from Ambien to Lunesta,  consider Ambien CR - Plan: eszopiclone (LUNESTA) 2 MG TABS tablet  Generalized anxiety disorder - Continue current Wellbutrin, psychiatry referral if desired by patient - Plan: buPROPion (WELLBUTRIN XL) 300 MG 24 hr tablet  Anxiety and depression - Plan: clonazePAM (KLONOPIN) 0.5 MG tablet  Gastroesophageal reflux disease, esophagitis presence not specified - Plan: pantoprazole (PROTONIX) 40 MG tablet  Migraine with aura and without status migrainosus, not intractable  Annual physical exam - Labs ordered for future visit, preventive care not discussed today - Plan: CBC, COMPLETE METABOLIC PANEL WITH GFR, Lipid panel, TSH, VITAMIN D 25 Hydroxy (Vit-D Deficiency, Fractures)      Follow-up plan: Return for ANNUAL PHYSICAL next few months, soonerif needed, refills in 6 months .  Visit summary with medication list and pertinent instructions was printed for patient to review, alert [place] if any changes needed. All questions at time of visit were answered - patient instructed to contact  office with any additional concerns. ER/RTC precautions were reviewed with the patient and understanding verbalized.   Note: Total time spent 25 minutes, greater than 50% of the visit was spent face-to-face counseling and coordinating care for the following: The primary encounter diagnosis was Insomnia, unspecified type. Diagnoses of Generalized anxiety disorder, Anxiety and depression, Gastroesophageal reflux disease, esophagitis presence not specified

## 2017-01-05 MED FILL — NORETHINDRONE 0.35 MG TAB: 0.35 | 84 days supply | Qty: 84 | Fill #2

## 2017-01-20 MED FILL — PANTOPRAZOLE SOD DR 40 MG T: 40 | 90 days supply | Qty: 90 | Fill #3

## 2017-02-09 ENCOUNTER — Emergency Department (INDEPENDENT_AMBULATORY_CARE_PROVIDER_SITE_OTHER)
Admission: EM | Admit: 2017-02-09 | Discharge: 2017-02-09 | Disposition: A | Discharge status: Home / Self Care | Payer: 59 | Source: Home / Self Care | Attending: Family Medicine | Admitting: Family Medicine

## 2017-02-09 ENCOUNTER — Encounter: Payer: Self-pay | Admitting: Emergency Medicine

## 2017-02-09 ENCOUNTER — Other Ambulatory Visit: Payer: Self-pay

## 2017-02-09 DIAGNOSIS — Z23 Encounter for immunization: Secondary | ICD-10-CM | POA: Diagnosis not present

## 2017-02-09 DIAGNOSIS — S61210A Laceration without foreign body of right index finger without damage to nail, initial encounter: Secondary | ICD-10-CM

## 2017-02-09 MED ORDER — TETANUS-DIPHTH-ACELL PERTUSSIS 5-2.5-18.5 LF-MCG/0.5 IM SUSP
0.5000 mL | Freq: Once | INTRAMUSCULAR | Status: AC
  Administered 2017-02-09: 0.5 mL via INTRAMUSCULAR

## 2017-02-09 NOTE — ED Provider Notes (Signed)
[facility]    CSN: [id number] Arrival date & time: 02/09/17  1358     History   Chief Complaint Chief Complaint  Patient presents with  . Laceration    HPI Tracey Patterson is a 36 y.o. female.   Patient cut her right second finger while washing dishes today.  Her Tdap is not current.   The history is provided by the patient.  Laceration  Location:  Finger Finger laceration location:  R index finger Length:  1cm Depth:  Through dermis Quality: straight   Bleeding: controlled   Laceration mechanism:  Knife Pain details:    Quality:  Aching   Severity:  Mild   Timing:  Constant   Progression:  Improving Foreign body present:  No foreign bodies Worsened by:  Movement Ineffective treatments:  None tried Tetanus status:  Out of date Associated symptoms: no numbness, no redness and no swelling     Past Medical History:  Diagnosis Date  . Anxiety   . Asthma   . History of TMJ disorder     Patient Active Problem List   Diagnosis Date Noted  . Left shoulder pain 09/07/2016  . Degenerative tear of acetabular labrum of left hip 07/15/2016  . Ganglion cyst 02/17/2016  . Generalized anxiety disorder 02/17/2016  . Recurrent sinusitis 02/17/2016  . Vitamin D deficiency 02/15/2014  . Radiculitis of left cervical region 11/30/2013  . Left lumbar radiculitis 11/30/2013  . Asthma, chronic 12/14/2012  . GERD (gastroesophageal reflux disease) 12/14/2012  . Seasonal allergies 12/14/2012  . Anxiety and depression 12/14/2012  . Transaminitis 12/14/2012  . Myalgia and myositis 10/30/2012  . Other malaise and fatigue 10/26/2012  . Flushing 10/26/2012  . Insomnia 08/13/2012  . Stress and adjustment reaction 08/13/2012    Past Surgical History:  Procedure Laterality Date  . BACK SURGERY  December 2015  . KNEE ARTHROSCOPY  1998   left  . TONSILLECTOMY      OB History    No data available       Home Medications    Prior to Admission medications     Medication Sig Start Date End Date Taking? Authorizing Provider  Acetaminophen (TYLENOL ARTHRITIS PAIN PO) Take by mouth.    [provider]  albuterol (PROVENTIL HFA;VENTOLIN HFA) 108 (90 BASE) MCG/ACT inhaler Inhale 2 puffs into the lungs every 4 (four) hours as needed for wheezing (cough, shortness of breath or wheezing.). 02/26/15   Hommel, Hilliard Clark, DO  buPROPion (WELLBUTRIN XL) 300 MG 24 hr tablet Take 1 tablet (300 mg total) by mouth daily. 12/02/16   Emeterio Reeve, DO  cetirizine (ZYRTEC) 10 MG tablet Take 1 tablet (10 mg total) by mouth daily. 09/20/13   Marcial Pacas, DO  clonazePAM (KLONOPIN) 0.5 MG tablet Half to full tab daily as needed for anxiety. 12/02/16 12/02/17  Emeterio Reeve, DO  cyclobenzaprine (FLEXERIL) 10 MG tablet One half tab PO qHS, then increase gradually to one tab TID. 10/19/16   Silverio Decamp, MD  eszopiclone (LUNESTA) 2 MG TABS tablet Take 0.5-1.5 tablets (1-3 mg total) by mouth at bedtime as needed for sleep. Take immediately before bedtime. Use lowest effective dose. 12/02/16   Emeterio Reeve, DO  fluticasone (FLONASE) 50 MCG/ACT nasal spray Place 2 sprays into both nostrils daily. 02/10/16   Dutch Quint B, FNP  IBUPROFEN PO Take 800 mg by mouth as needed.     [provider]  Multiple Vitamin (MULTIVITAMIN) tablet Take 1 tablet by mouth daily.  [provider]  norethindrone (CAMILA) 0.35 MG tablet One by mouth daily. 07/02/16   Emeterio Reeve, DO  pantoprazole (PROTONIX) 40 MG tablet Take 1 tablet (40 mg total) by mouth daily. 12/02/16   Emeterio Reeve, DO  SUMAtriptan (IMITREX) 50 MG tablet Take 1-2 tablets (50-100 mg total) by mouth once as needed for migraine. May repeat in 2 hours if headache persists or recurs. Limit use to 200 mg (4 tablets) per 24 hours, use no more than 1-2 times per week 07/08/16   Emeterio Reeve, DO  zolpidem (AMBIEN) 10 MG tablet TAKE 1/2 TO 1 TABLET BY MOUTH AT NIGHT FOR UP TO 8 HOURS OF  SLEEP APPOINTMENT NEEDED FOR FURTHER REFILLS 04/21/16   Emeterio Reeve, DO    Family History Family History  Problem Relation Age of Onset  . Hypertension Mother   . Depression Mother   . Hypertension Father     Social History Social History   Tobacco Use  . Smoking status: Never Smoker  . Smokeless tobacco: Never Used  Substance Use Topics  . Alcohol use: Yes  . Drug use: No     Allergies   Crab [shellfish allergy]; Sulfa antibiotics; Zoloft [sertraline hcl]; and Other   Review of Systems Review of Systems  All other systems reviewed and are negative.    Physical Exam Triage Vital Signs ED Triage Vitals  Enc Vitals Group     BP 02/09/17 1408 134/85     Pulse Rate 02/09/17 1408 100     Resp --      Temp 02/09/17 1408 98.1 F (36.7 C)     Temp Source 02/09/17 1408 Oral     SpO2 02/09/17 1408 100 %     Weight 02/09/17 1409 180 lb (81.6 kg)     Height 02/09/17 1409 5' 6"  (1.676 m)     Head Circumference --      Peak Flow --      Pain Score 02/09/17 1409 2     Pain Loc --      Pain Edu? --      Excl. in [place]? --    No data found.  Updated Vital Signs BP 134/85 (BP Location: Left Arm)   Pulse 100   Temp 98.1 F (36.7 C) (Oral)   Ht 5' 6"  (1.676 m)   Wt 180 lb (81.6 kg)   SpO2 100%   BMI 29.05 kg/m   Visual Acuity Right Eye Distance:   Left Eye Distance:   Bilateral Distance:    Right Eye Near:   Left Eye Near:    Bilateral Near:     Physical Exam  Constitutional: She appears well-nourished. No distress.  HENT:  Head: Atraumatic.  Eyes: Pupils are equal, round, and reactive to light.  Cardiovascular: Normal rate.  Pulmonary/Chest: Effort normal.  Musculoskeletal:       Right hand: She exhibits laceration. She exhibits normal range of motion, no tenderness, no bony tenderness, normal two-point discrimination, normal capillary refill, no deformity and no swelling. Normal sensation noted.       Hands: 1cm laceration dorsally over right  second finger proximal phalanx.  Neurological: She is alert.  Skin: Skin is warm and dry.  Nursing note and vitals reviewed.    UC Treatments / Results  Labs (all labs ordered are listed, but only abnormal results are displayed) Labs Reviewed - No data to display  EKG  EKG Interpretation None       Radiology No results found.  Procedures Procedures Laceration Repair Discussed benefits and risks of procedure and verbal consent obtained. Using sterile technique and digital anesthesia with 2% lidocaine without epinephrine, cleansed wound with Betadine followed by copious lavage with normal saline.  Wound carefully inspected for debris and foreign bodies; none found.  Wound closed with #3, 4-0 interrupted nylon sutures.  Bacitracin and non-stick sterile dressing applied.  Wound precautions explained to patient.  Return for suture removal in 10 days.   Medications Ordered in UC Medications  Tdap (BOOSTRIX) injection 0.5 mL (0.5 mLs Intramuscular Given 02/09/17 1408)     Initial Impression / Assessment and Plan / UC Course  I have reviewed the triage vital signs and the nursing notes.  Pertinent labs & imaging results that were available during my care of the patient were reviewed by me and considered in my medical decision making (see chart for details).    Administered Tdap  Change dressing daily and apply Bacitracin ointment to wound.  Keep wound clean and dry.  Return for any signs of infection (or follow-up with family doctor):  Increasing redness, swelling, pain, heat, drainage, etc. Return in 10 days for suture removal.      Final Clinical Impressions(s) / UC Diagnoses   Final diagnoses:  Laceration of right index finger without foreign body without damage to nail, initial encounter    ED Discharge Orders    None           Kandra Nicolas, MD 02/09/17 1441

## 2017-02-09 NOTE — Discharge Instructions (Signed)
Change dressing daily and apply Bacitracin ointment to wound.  Keep wound clean and dry.  Return for any signs of infection (or follow-up with family doctor):  Increasing redness, swelling, pain, heat, drainage, etc. Return in 10 days for suture removal.

## 2017-02-09 NOTE — ED Triage Notes (Signed)
Rt index finger laceration top of finger 1 cm, bleeding is controlled, cut with a knife washing dishes

## 2017-02-18 ENCOUNTER — Emergency Department
Admission: EM | Admit: 2017-02-18 | Discharge: 2017-02-18 | Disposition: A | Discharge status: Home / Self Care | Payer: 59 | Source: Home / Self Care

## 2017-02-18 NOTE — ED Triage Notes (Signed)
Here for removal of 3 sutures on first knuckle of right index finger placed 10days ago.

## 2017-03-04 MED FILL — BUPROPION HCL XL 300 MG TAB: 300 | 90 days supply | Qty: 90 | Fill #1

## 2017-03-15 DIAGNOSIS — Z6830 Body mass index (BMI) 30.0-30.9, adult: Secondary | ICD-10-CM | POA: Diagnosis not present

## 2017-03-15 DIAGNOSIS — Z01419 Encounter for gynecological examination (general) (routine) without abnormal findings: Secondary | ICD-10-CM | POA: Diagnosis not present

## 2017-03-15 DIAGNOSIS — N920 Excessive and frequent menstruation with regular cycle: Secondary | ICD-10-CM | POA: Diagnosis not present

## 2017-03-15 LAB — HM PAP SMEAR

## 2017-03-28 ENCOUNTER — Telehealth: Payer: 59 | Admitting: Family

## 2017-03-28 ENCOUNTER — Other Ambulatory Visit: Payer: Self-pay | Admitting: Osteopathic Medicine

## 2017-03-28 DIAGNOSIS — J019 Acute sinusitis, unspecified: Secondary | ICD-10-CM | POA: Diagnosis not present

## 2017-03-28 MED ORDER — AMOXICILLIN-POT CLAVULANATE 875-125 MG PO TABS: 1.0000 | ORAL_TABLET | Freq: Two times a day (BID) | ORAL | 0 refills | Status: DC

## 2017-03-28 MED ORDER — BENZONATATE 100 MG PO CAPS: 100.0000 mg | ORAL_CAPSULE | Freq: Three times a day (TID) | ORAL | 0 refills | Status: DC | PRN

## 2017-03-28 MED FILL — AMOX-CLAV 875-125 MG TABLET: 875-125 | 7 days supply | Qty: 14 | Fill #0

## 2017-03-28 MED FILL — BENZONATATE 100 MG CAPSULE: 100 | 6 days supply | Qty: 20 | Fill #0

## 2017-03-28 NOTE — Progress Notes (Signed)
We are sorry that you are not feeling well.  Here is how we plan to help!  Based on what you have shared with me it looks like you have sinusitis.  Sinusitis is inflammation and infection in the sinus cavities of the head.  Based on your presentation I believe you most likely have Acute Bacterial Sinusitis.  This is an infection caused by bacteria and is treated with antibiotics. I have prescribed Augmentin 865m/125mg one tablet twice daily with food, for 7 days. You may use an oral decongestant such as Mucinex D or if you have glaucoma or high blood pressure use plain Mucinex. Saline nasal spray help and can safely be used as often as needed for congestion.  If you develop worsening sinus pain, fever or notice severe headache and vision changes, or if symptoms are not better after completion of antibiotic, please schedule an appointment with a health care provider.    In addition you may use A prescription cough medication called Tessalon Perles 1085m You may take 1-2 capsules every 8 hours as needed for your cough.  Sinus infections are not as easily transmitted as other respiratory infection, however we still recommend that you avoid close contact with loved ones, especially the very young and elderly.  Remember to wash your hands thoroughly throughout the day as this is the number one way to prevent the spread of infection!  Home Care:  Only take medications as instructed by your medical team.  Complete the entire course of an antibiotic.  Do not take these medications with alcohol.  A steam or ultrasonic humidifier can help congestion.  You can place a towel over your head and breathe in the steam from hot water coming from a faucet.  Avoid close contacts especially the very young and the elderly.  Cover your mouth when you cough or sneeze.  Always remember to wash your hands.  Get Help Right Away If:  You develop worsening fever or sinus pain.  You develop a severe head ache or  visual changes.  Your symptoms persist after you have completed your treatment plan.  Make sure you  Understand these instructions.  Will watch your condition.  Will get help right away if you are not doing well or get worse.  Your e-visit answers were reviewed by a board certified advanced clinical practitioner to complete your personal care plan.  Depending on the condition, your plan could have included both over the counter or prescription medications.  If there is a problem please reply  once you have received a response from your provider.  Your safety is important to [place] If you have drug allergies check your prescription carefully.    You can use MyChart to ask questions about today's visit, request a non-urgent call back, or ask for a work or school excuse for 24 hours related to this e-Visit. If it has been greater than 24 hours you will need to follow up with your provider, or enter a new e-Visit to address those concerns.  You will get an e-mail in the next two days asking about your experience.  I hope that your e-visit has been valuable and will speed your recovery. Thank you for using e-visits.

## 2017-03-31 ENCOUNTER — Ambulatory Visit (INDEPENDENT_AMBULATORY_CARE_PROVIDER_SITE_OTHER): Payer: No Typology Code available for payment source | Admitting: Family Medicine

## 2017-03-31 VITALS — BP 117/63 | HR 82 | Wt 211.0 lb

## 2017-03-31 DIAGNOSIS — R05 Cough: Secondary | ICD-10-CM | POA: Diagnosis not present

## 2017-03-31 DIAGNOSIS — J329 Chronic sinusitis, unspecified: Secondary | ICD-10-CM

## 2017-03-31 DIAGNOSIS — B9689 Other specified bacterial agents as the cause of diseases classified elsewhere: Secondary | ICD-10-CM | POA: Diagnosis not present

## 2017-03-31 DIAGNOSIS — R059 Cough, unspecified: Secondary | ICD-10-CM

## 2017-03-31 DIAGNOSIS — M7582 Other shoulder lesions, left shoulder: Secondary | ICD-10-CM

## 2017-03-31 MED ORDER — ALBUTEROL SULFATE HFA 108 (90 BASE) MCG/ACT IN AERS: 2.0000 | INHALATION_SPRAY | RESPIRATORY_TRACT | 1 refills | Status: DC | PRN

## 2017-03-31 MED ORDER — TRAMADOL HCL 50 MG PO TABS: 50.0000 mg | ORAL_TABLET | Freq: Three times a day (TID) | ORAL | 0 refills | Status: DC | PRN

## 2017-03-31 MED ORDER — PREDNISONE 10 MG PO TABS: 30.0000 mg | ORAL_TABLET | Freq: Every day | ORAL | 0 refills | Status: DC

## 2017-03-31 MED FILL — traMADol HCL 50 MG TABS: 50 | 6 days supply | Qty: 20 | Fill #0

## 2017-03-31 MED FILL — predniSONE 10 MG TABS: 10 | 5 days supply | Qty: 15 | Fill #0

## 2017-03-31 MED FILL — VENTOLIN HFA 90 MCG INHALER: 108 (90 BAS | 25 days supply | Qty: 18 | Fill #0

## 2017-03-31 NOTE — Progress Notes (Signed)
Tracey Patterson is a 37 y.o. female who presents to [facility]: [facility] today for left shoulder pain and sinusitis.  Tracey Patterson notes several day history of acute onset of left shoulder pain.  The pain occurred without injury.  She notes the pain is located in the lateral upper arm and worse with reaching back.  She denies any radiating pain down the arm.  She works as an Geologist, engineering in the hospital on weekends.  She does think that she will be able to work on Saturday.  She has tried some over-the-counter medications which have helped only a little.  Additionally Tracey Patterson notes sinus pain and pressure sore with cough and congestion and body aches.  She had an E-visit later this week and she was thought to have sinusitis.  She was prescribed Augmentin and Tessalon Perles which have not helped very much at all.  She notes the cough is profoundly obnoxious especially at night interfering with sleep.  She does have a history of mild exercise asthma and notes that she has been wheezing a little bit as well.  She does not have an albuterol inhaler.   Past Medical History:  Diagnosis Date  . Anxiety   . Asthma   . History of TMJ disorder    Past Surgical History:  Procedure Laterality Date  . BACK SURGERY  December 2015  . KNEE ARTHROSCOPY  1998   left  . LAPAROSCOPIC TUBAL LIGATION  02/28/2012   Procedure: LAPAROSCOPIC TUBAL LIGATION;  Surgeon: Cyril Mourning, MD;  Location: [facility];  Service: Gynecology;  Laterality: Bilateral;  . TONSILLECTOMY     Social History   Tobacco Use  . Smoking status: Never Smoker  . Smokeless tobacco: Never Used  Substance Use Topics  . Alcohol use: Yes   family history includes Depression in her mother; Hypertension in her father and mother.  ROS as above:  Medications: Current Outpatient Medications  Medication Sig Dispense Refill  .  Acetaminophen (TYLENOL ARTHRITIS PAIN PO) Take by mouth.    Marland Kitchen albuterol (PROVENTIL HFA;VENTOLIN HFA) 108 (90 Base) MCG/ACT inhaler Inhale 2 puffs into the lungs every 4 (four) hours as needed for wheezing (cough, shortness of breath or wheezing.). 1 Inhaler 1  . amoxicillin-clavulanate (AUGMENTIN) 875-125 MG tablet Take 1 tablet by mouth 2 (two) times daily. 14 tablet 0  . benzonatate (TESSALON PERLES) 100 MG capsule Take 1 capsule (100 mg total) by mouth 3 (three) times daily as needed. 20 capsule 0  . buPROPion (WELLBUTRIN XL) 300 MG 24 hr tablet Take 1 tablet (300 mg total) by mouth daily. 90 tablet 3  . cetirizine (ZYRTEC) 10 MG tablet Take 1 tablet (10 mg total) by mouth daily. 30 tablet 3  . clonazePAM (KLONOPIN) 0.5 MG tablet Half to full tab daily as needed for anxiety. 90 tablet 1  . cyclobenzaprine (FLEXERIL) 10 MG tablet One half tab PO qHS, then increase gradually to one tab TID. 30 tablet 0  . eszopiclone (LUNESTA) 2 MG TABS tablet Take 0.5-1.5 tablets (1-3 mg total) by mouth at bedtime as needed for sleep. Take immediately before bedtime. Use lowest effective dose. 30 tablet 1  . fluticasone (FLONASE) 50 MCG/ACT nasal spray Place 2 sprays into both nostrils daily. 16 g 6  . IBUPROFEN PO Take 800 mg by mouth as needed.     . Multiple Vitamin (MULTIVITAMIN) tablet Take 1 tablet by mouth daily.    . norethindrone ([place])  0.35 MG tablet One by mouth daily. 3 Package 2  . pantoprazole (PROTONIX) 40 MG tablet Take 1 tablet (40 mg total) by mouth daily. 90 tablet 3  . SUMAtriptan (IMITREX) 50 MG tablet Take 1-2 tablets (50-100 mg total) by mouth once as needed for migraine. May repeat in 2 hours if headache persists or recurs. Limit use to 200 mg (4 tablets) per 24 hours, use no more than 1-2 times per week 30 tablet 0  . zolpidem (AMBIEN) 10 MG tablet TAKE 1/2 TO 1 TABLET BY MOUTH AT NIGHT FOR UP TO 8 HOURS OF SLEEP APPOINTMENT NEEDED FOR FURTHER REFILLS 30 tablet 0  . predniSONE  (DELTASONE) 10 MG tablet Take 3 tablets (30 mg total) by mouth daily with breakfast. 15 tablet 0  . traMADol (ULTRAM) 50 MG tablet Take 1 tablet (50 mg total) by mouth every 8 (eight) hours as needed (pain or cough). 20 tablet 0   No current facility-administered medications for this visit.    Allergies  Allergen Reactions  . Crab [Shellfish Allergy] Other (See Comments)    Allergist advises against eating crab.  . Sulfa Antibiotics Hives  . Zoloft [Sertraline Hcl]     Hair loss  . Other Rash    Dermabond, steri strips    Health Maintenance Health Maintenance  Topic Date Due  . HIV Screening  05/09/1993  . PAP SMEAR  05/10/1999  . INFLUENZA VACCINE  12/02/2017 (Originally 10/27/2016)  . TETANUS/TDAP  02/10/2027     Exam:  BP 117/63   Pulse 82   Wt 211 lb (95.7 kg)   BMI 34.06 kg/m  Gen: Well NAD HEENT: EOMI,  MMM clear nasal discharge.  Posterior pharynx with cobblestoning normal tympanic membranes bilaterally no significant cervical lymphadenopathy.  Nontender to palpation maxillary and frontal sinuses. Lungs: Normal work of breathing. CTABL Heart: RRR no MRG Abd: NABS, Soft. Nondistended, Nontender Exts: Brisk capillary refill, warm and well perfused.  MSK: Left shoulder normal-appearing nontender. Painful abduction arc but full range of motion. Internal motion is painful and limited to the lumbar spine versus the thoracic spine on the right side. Positive Hawkins and Neer's test. Positive empty cans test. Negative Yergason's and speeds test.  Pulses and capillary refill are intact upper extremities  No results found for this or any previous visit (from the past 72 hour(s)). No results found.    Assessment and Plan: 37 y.o. female with  Left shoulder pain is likely rotator cuff tendinitis versus subacromial bursitis.  We had a lengthy discussion of treatment options.  Plan for home exercise program.  Additionally will use prednisone mostly for sinusitis however  this should be held shoulder pain.  If this does not help we will proceed with an injection next week.  Tramadol as well for pain control.  Sinusitis not improving.  Plan for trial of prednisone and tramadol for cough.  Will also use albuterol as needed.  Recheck if not improving.   No orders of the defined types were placed in this encounter.  Meds ordered this encounter  Medications  . predniSONE (DELTASONE) 10 MG tablet    Sig: Take 3 tablets (30 mg total) by mouth daily with breakfast.    Dispense:  15 tablet    Refill:  0  . traMADol (ULTRAM) 50 MG tablet    Sig: Take 1 tablet (50 mg total) by mouth every 8 (eight) hours as needed (pain or cough).    Dispense:  20 tablet    Refill:  0  . albuterol (PROVENTIL HFA;VENTOLIN HFA) 108 (90 Base) MCG/ACT inhaler    Sig: Inhale 2 puffs into the lungs every 4 (four) hours as needed for wheezing (cough, shortness of breath or wheezing.).    Dispense:  1 Inhaler    Refill:  1     Discussed warning signs or symptoms. Please see discharge instructions. Patient expresses understanding.

## 2017-03-31 NOTE — Patient Instructions (Signed)
Thank you for coming in today. Use tramadol sparingly.  Do home exercises.  Return for injection if not better.  Take prednisone for inflammation.  STOP Augmentin If you cannot work Saturday or Sunday I will provide a work note and will complete FMLA paperwork to protect your job.     Shoulder Impingement Syndrome Rehab Ask your health care provider which exercises are safe for you. Do exercises exactly as told by your health care provider and adjust them as directed. It is normal to feel mild stretching, pulling, tightness, or discomfort as you do these exercises, but you should stop right away if you feel sudden pain or your pain gets worse.Do not begin these exercises until told by your health care provider. Stretching and range of motion exercise This exercise warms up your muscles and joints and improves the movement and flexibility of your shoulder. This exercise also helps to relieve pain and stiffness. Exercise A: Passive horizontal adduction  1. Sit or stand and pull your left / right elbow across your chest, toward your other shoulder. Stop when you feel a gentle stretch in the back of your shoulder and upper arm. ? Keep your arm at shoulder height. ? Keep your arm as close to your body as you comfortably can. 2. Hold for __________ seconds. 3. Slowly return to the starting position. Repeat __________ times. Complete this exercise __________ times a day. Strengthening exercises These exercises build strength and endurance in your shoulder. Endurance is the ability to use your muscles for a long time, even after they get tired. Exercise B: External rotation, isometric 1. Stand or sit in a doorway, facing the door frame. 2. Bend your left / right elbow and place the back of your wrist against the door frame. Only your wrist should be touching the frame. Keep your upper arm at your side. 3. Gently press your wrist against the door frame, as if you are trying to push your arm away  from your abdomen. ? Avoid shrugging your shoulder while you press your hand against the door frame. Keep your shoulder blade tucked down toward the middle of your back. 4. Hold for __________ seconds. 5. Slowly release the tension, and relax your muscles completely before you do the exercise again. Repeat __________ times. Complete this exercise __________ times a day. Exercise C: Internal rotation, isometric  1. Stand or sit in a doorway, facing the door frame. 2. Bend your left / right elbow and place the inside of your wrist against the door frame. Only your wrist should be touching the frame. Keep your upper arm at your side. 3. Gently press your wrist against the door frame, as if you are trying to push your arm toward your abdomen. ? Avoid shrugging your shoulder while you press your hand against the door frame. Keep your shoulder blade tucked down toward the middle of your back. 4. Hold for __________ seconds. 5. Slowly release the tension, and relax your muscles completely before you do the exercise again. Repeat __________ times. Complete this exercise __________ times a day. Exercise D: Scapular protraction, supine  1. Lie on your back on a firm surface. Hold a __________ weight in your left / right hand. 2. Raise your left / right arm straight into the air so your hand is directly above your shoulder joint. 3. Push the weight into the air so your shoulder lifts off of the surface that you are lying on. Do not move your head, neck, or back. 4.  Hold for __________ seconds. 5. Slowly return to the starting position. Let your muscles relax completely before you repeat this exercise. Repeat __________ times. Complete this exercise __________ times a day. Exercise E: Scapular retraction  1. Sit in a stable chair without armrests, or stand. 2. Secure an exercise band to a stable object in front of you so the band is at shoulder height. 3. Hold one end of the exercise band in each hand.  Your palms should face down. 4. Squeeze your shoulder blades together and move your elbows slightly behind you. Do not shrug your shoulders while you do this. 5. Hold for __________ seconds. 6. Slowly return to the starting position. Repeat __________ times. Complete this exercise __________ times a day. Exercise F: Shoulder extension  1. Sit in a stable chair without armrests, or stand. 2. Secure an exercise band to a stable object in front of you where the band is above shoulder height. 3. Hold one end of the exercise band in each hand. 4. Straighten your elbows and lift your hands up to shoulder height. 5. Squeeze your shoulder blades together and pull your hands down to the sides of your thighs. Stop when your hands are straight down by your sides. Do not let your hands go behind your body. 6. Hold for __________ seconds. 7. Slowly return to the starting position. Repeat __________ times. Complete this exercise __________ times a day. This information is not intended to replace advice given to you by your health care provider. Make sure you discuss any questions you have with your health care provider. Document Released: 03/15/2005 Document Revised: 11/20/2015 Document Reviewed: 02/15/2015 Elsevier Interactive Patient Education  [employer].

## 2017-04-15 MED FILL — PANTOPRAZOLE SOD DR 40 MG T: 40 | 90 days supply | Qty: 90 | Fill #0

## 2017-05-10 ENCOUNTER — Telehealth: Payer: Self-pay | Admitting: Physician Assistant

## 2017-05-10 DIAGNOSIS — Z20828 Contact with and (suspected) exposure to other viral communicable diseases: Secondary | ICD-10-CM

## 2017-05-10 MED ORDER — OSELTAMIVIR PHOSPHATE 75 MG PO CAPS: 75.0000 mg | ORAL_CAPSULE | Freq: Every day | ORAL | 0 refills | Status: AC

## 2017-05-10 MED FILL — OSELTAMIVIR PHOSPHATE 75 MG: 75 | 10 days supply | Qty: 10 | Fill #0

## 2017-05-10 NOTE — Telephone Encounter (Signed)
Daughter diagnosed with influenza A in office today. Lab Results  Component Value Date   CREATININE 0.97 02/26/2016   BUN 8 02/26/2016   NA 138 02/26/2016   K 4.3 02/26/2016   CL 104 02/26/2016   CO2 24 02/26/2016    1. Exposure to influenza - oseltamivir (TAMIFLU) 75 MG capsule; Take 1 capsule (75 mg total) by mouth daily for 10 days.  Dispense: 10 capsule; Refill: 0

## 2017-05-23 ENCOUNTER — Telehealth: Payer: No Typology Code available for payment source | Admitting: Family

## 2017-05-23 DIAGNOSIS — J329 Chronic sinusitis, unspecified: Secondary | ICD-10-CM | POA: Diagnosis not present

## 2017-05-23 DIAGNOSIS — B9689 Other specified bacterial agents as the cause of diseases classified elsewhere: Secondary | ICD-10-CM | POA: Diagnosis not present

## 2017-05-23 MED ORDER — PREDNISONE 5 MG PO TABS: 5.0000 mg | ORAL_TABLET | ORAL | 0 refills | Status: DC

## 2017-05-23 MED ORDER — DOXYCYCLINE HYCLATE 100 MG PO TABS: 100.0000 mg | ORAL_TABLET | Freq: Two times a day (BID) | ORAL | 0 refills | Status: DC

## 2017-05-23 MED FILL — predniSONE 5 MG TABS: 5 | 6 days supply | Qty: 21 | Fill #0

## 2017-05-23 MED FILL — DOXYCYCLINE HYCLATE 100 MG: 100 | 10 days supply | Qty: 20 | Fill #0

## 2017-05-23 NOTE — Progress Notes (Signed)
Thank you for the details you included in the comment boxes. Those details are very helpful in determining the best course of treatment for you and help [place] to provide the best care.  We are sorry that you are not feeling well.  Here is how we plan to help!  Based on what you have shared with me it looks like you have sinusitis.  Sinusitis is inflammation and infection in the sinus cavities of the head.  Based on your presentation I believe you most likely have Acute Bacterial Sinusitis.  This is an infection caused by bacteria and is treated with antibiotics. I have prescribed Doxycycline 184m by mouth twice a day for 10 days. You may use an oral decongestant such as Mucinex D or if you have glaucoma or high blood pressure use plain Mucinex. Saline nasal spray help and can safely be used as often as needed for congestion.  If you develop worsening sinus pain, fever or notice severe headache and vision changes, or if symptoms are not better after completion of antibiotic, please schedule an appointment with a health care provider.    I have also sent a prednisone pack as you requested.   Sinus infections are not as easily transmitted as other respiratory infection, however we still recommend that you avoid close contact with loved ones, especially the very young and elderly.  Remember to wash your hands thoroughly throughout the day as this is the number one way to prevent the spread of infection!  Home Care:  Only take medications as instructed by your medical team.  Complete the entire course of an antibiotic.  Do not take these medications with alcohol.  A steam or ultrasonic humidifier can help congestion.  You can place a towel over your head and breathe in the steam from hot water coming from a faucet.  Avoid close contacts especially the very young and the elderly.  Cover your mouth when you cough or sneeze.  Always remember to wash your hands.  Get Help Right Away If:  You develop  worsening fever or sinus pain.  You develop a severe head ache or visual changes.  Your symptoms persist after you have completed your treatment plan.  Make sure you  Understand these instructions.  Will watch your condition.  Will get help right away if you are not doing well or get worse.  Your e-visit answers were reviewed by a board certified advanced clinical practitioner to complete your personal care plan.  Depending on the condition, your plan could have included both over the counter or prescription medications.  If there is a problem please reply  once you have received a response from your provider.  Your safety is important to [place]  If you have drug allergies check your prescription carefully.    You can use MyChart to ask questions about today's visit, request a non-urgent call back, or ask for a work or school excuse for 24 hours related to this e-Visit. If it has been greater than 24 hours you will need to follow up with your provider, or enter a new e-Visit to address those concerns.  You will get an e-mail in the next two days asking about your experience.  I hope that your e-visit has been valuable and will speed your recovery. Thank you for using e-visits.

## 2017-06-07 MED FILL — BUPROPION HCL XL 300 MG TAB: 300 | 90 days supply | Qty: 90 | Fill #2

## 2017-06-16 ENCOUNTER — Emergency Department (HOSPITAL_BASED_OUTPATIENT_CLINIC_OR_DEPARTMENT_OTHER)
Admission: EM | Admit: 2017-06-16 | Discharge: 2017-06-16 | Disposition: A | Discharge status: Home / Self Care | Payer: No Typology Code available for payment source | Attending: Emergency Medicine | Admitting: Emergency Medicine

## 2017-06-16 ENCOUNTER — Telehealth: Payer: Self-pay | Admitting: Emergency Medicine

## 2017-06-16 ENCOUNTER — Other Ambulatory Visit: Payer: Self-pay

## 2017-06-16 ENCOUNTER — Encounter (HOSPITAL_BASED_OUTPATIENT_CLINIC_OR_DEPARTMENT_OTHER): Payer: Self-pay | Admitting: *Deleted

## 2017-06-16 ENCOUNTER — Emergency Department (HOSPITAL_BASED_OUTPATIENT_CLINIC_OR_DEPARTMENT_OTHER): Payer: No Typology Code available for payment source

## 2017-06-16 DIAGNOSIS — J45909 Unspecified asthma, uncomplicated: Secondary | ICD-10-CM | POA: Diagnosis not present

## 2017-06-16 DIAGNOSIS — Z3202 Encounter for pregnancy test, result negative: Secondary | ICD-10-CM | POA: Diagnosis not present

## 2017-06-16 DIAGNOSIS — Z79899 Other long term (current) drug therapy: Secondary | ICD-10-CM | POA: Diagnosis not present

## 2017-06-16 DIAGNOSIS — N83202 Unspecified ovarian cyst, left side: Secondary | ICD-10-CM | POA: Diagnosis not present

## 2017-06-16 DIAGNOSIS — K529 Noninfective gastroenteritis and colitis, unspecified: Secondary | ICD-10-CM | POA: Insufficient documentation

## 2017-06-16 DIAGNOSIS — R1032 Left lower quadrant pain: Secondary | ICD-10-CM | POA: Diagnosis present

## 2017-06-16 LAB — URINALYSIS, MICROSCOPIC (REFLEX)

## 2017-06-16 LAB — CBC WITH DIFFERENTIAL/PLATELET
BASOS PCT: 0 %
Basophils Absolute: 0 10*3/uL (ref 0.0–0.1)
EOS PCT: 0 %
Eosinophils Absolute: 0 10*3/uL (ref 0.0–0.7)
HEMATOCRIT: 38.9 % (ref 36.0–46.0)
Hemoglobin: 13 g/dL (ref 12.0–15.0)
LYMPHS PCT: 17 %
Lymphs Abs: 2.3 10*3/uL (ref 0.7–4.0)
MCH: 30.7 pg (ref 26.0–34.0)
MCHC: 33.4 g/dL (ref 30.0–36.0)
MCV: 91.7 fL (ref 78.0–100.0)
Monocytes Absolute: 0.5 10*3/uL (ref 0.1–1.0)
Monocytes Relative: 4 %
NEUTROS ABS: 10.5 10*3/uL — AB (ref 1.7–7.7)
Neutrophils Relative %: 79 %
PLATELETS: 387 10*3/uL (ref 150–400)
RBC: 4.24 MIL/uL (ref 3.87–5.11)
RDW: 13.6 % (ref 11.5–15.5)
WBC: 13.5 10*3/uL — AB (ref 4.0–10.5)

## 2017-06-16 LAB — BASIC METABOLIC PANEL
ANION GAP: 8 (ref 5–15)
BUN: 12 mg/dL (ref 6–20)
CO2: 20 mmol/L — ABNORMAL LOW (ref 22–32)
Calcium: 9.3 mg/dL (ref 8.9–10.3)
Chloride: 109 mmol/L (ref 101–111)
Creatinine, Ser: 0.73 mg/dL (ref 0.44–1.00)
GLUCOSE: 91 mg/dL (ref 65–99)
POTASSIUM: 4 mmol/L (ref 3.5–5.1)
Sodium: 137 mmol/L (ref 135–145)

## 2017-06-16 LAB — URINALYSIS, ROUTINE W REFLEX MICROSCOPIC
Bilirubin Urine: NEGATIVE
Glucose, UA: NEGATIVE mg/dL
Ketones, ur: 15 mg/dL — AB
LEUKOCYTES UA: NEGATIVE
NITRITE: NEGATIVE
PROTEIN: NEGATIVE mg/dL
Specific Gravity, Urine: 1.02 (ref 1.005–1.030)
pH: 7 (ref 5.0–8.0)

## 2017-06-16 LAB — WET PREP, GENITAL
Sperm: NONE SEEN
Trich, Wet Prep: NONE SEEN
Yeast Wet Prep HPF POC: NONE SEEN

## 2017-06-16 LAB — PREGNANCY, URINE: PREG TEST UR: NEGATIVE

## 2017-06-16 MED ORDER — ONDANSETRON 4 MG PO TBDP: 4.0000 mg | ORAL_TABLET | Freq: Three times a day (TID) | ORAL | 0 refills | Status: DC | PRN

## 2017-06-16 MED ORDER — ONDANSETRON HCL 4 MG/2ML IJ SOLN
4.0000 mg | Freq: Once | INTRAMUSCULAR | Status: AC
  Administered 2017-06-16: 4 mg via INTRAVENOUS
  Filled 2017-06-16: qty 2

## 2017-06-16 MED ORDER — IOPAMIDOL (ISOVUE-300) INJECTION 61%
100.0000 mL | Freq: Once | INTRAVENOUS | Status: AC | PRN
  Administered 2017-06-16: 100 mL via INTRAVENOUS

## 2017-06-16 MED ORDER — MORPHINE SULFATE (PF) 2 MG/ML IV SOLN
2.0000 mg | Freq: Once | INTRAVENOUS | Status: AC
  Administered 2017-06-16: 2 mg via INTRAVENOUS
  Filled 2017-06-16: qty 1

## 2017-06-16 MED ORDER — METRONIDAZOLE 500 MG PO TABS: 500.0000 mg | ORAL_TABLET | Freq: Two times a day (BID) | ORAL | 0 refills | Status: DC

## 2017-06-16 MED ORDER — KETOROLAC TROMETHAMINE 30 MG/ML IJ SOLN
30.0000 mg | Freq: Once | INTRAMUSCULAR | Status: AC
  Administered 2017-06-16: 30 mg via INTRAVENOUS
  Filled 2017-06-16: qty 1

## 2017-06-16 MED ORDER — CIPROFLOXACIN HCL 500 MG PO TABS: 500.0000 mg | ORAL_TABLET | Freq: Two times a day (BID) | ORAL | 0 refills | Status: DC

## 2017-06-16 NOTE — ED Provider Notes (Signed)
[facility] EMERGENCY DEPARTMENT Provider Note   CSN: [id number] Arrival date & time: 06/16/17  1716     History   Chief Complaint Chief Complaint  Patient presents with  . Abdominal Pain    HPI Tracey Patterson is a 37 y.o. female with past medical history of anxiety, asthma, GERD, presenting to the ED with acute onset of crampy intermittent lower abdominal pain that began today.  Patient states pain is sharp, intermittent, mostly located in the left lower and suprapubic region of her abdomen.  She states she has associated significant nausea.  Took Tylenol for symptoms.  Also reports episodes of diarrhea.  Of Note, she reports she recently had an IUD placed about 3 weeks ago.  LMP 2 weeks ago.  Denies abnormal vaginal bleeding or discharge, fever or chills, vomiting, or other complaints.  The history is provided by the patient.    Past Medical History:  Diagnosis Date  . Anxiety   . Asthma   . History of TMJ disorder     Patient Active Problem List   Diagnosis Date Noted  . Left shoulder pain 09/07/2016  . Degenerative tear of acetabular labrum of left hip 07/15/2016  . Ganglion cyst 02/17/2016  . Generalized anxiety disorder 02/17/2016  . Recurrent sinusitis 02/17/2016  . Vitamin D deficiency 02/15/2014  . Radiculitis of left cervical region 11/30/2013  . Left lumbar radiculitis 11/30/2013  . Asthma, chronic 12/14/2012  . GERD (gastroesophageal reflux disease) 12/14/2012  . Seasonal allergies 12/14/2012  . Anxiety and depression 12/14/2012  . Transaminitis 12/14/2012  . Myalgia and myositis 10/30/2012  . Other malaise and fatigue 10/26/2012  . Flushing 10/26/2012  . Insomnia 08/13/2012  . Stress and adjustment reaction 08/13/2012    Past Surgical History:  Procedure Laterality Date  . BACK SURGERY  December 2015  . KNEE ARTHROSCOPY  1998   left  . LAPAROSCOPIC TUBAL LIGATION  02/28/2012   Procedure: LAPAROSCOPIC TUBAL LIGATION;  Surgeon: Cyril Mourning, MD;  Location: [facility];  Service: Gynecology;  Laterality: Bilateral;  . TONSILLECTOMY      OB History   None      Home Medications    Prior to Admission medications   Medication Sig Start Date End Date Taking? Authorizing Provider  Acetaminophen (TYLENOL ARTHRITIS PAIN PO) Take by mouth.    [provider]  albuterol (PROVENTIL HFA;VENTOLIN HFA) 108 (90 Base) MCG/ACT inhaler Inhale 2 puffs into the lungs every 4 (four) hours as needed for wheezing (cough, shortness of breath or wheezing.). 03/31/17   Gregor Hams, MD  amoxicillin-clavulanate (AUGMENTIN) 875-125 MG tablet Take 1 tablet by mouth 2 (two) times daily. 03/28/17   Sharion Balloon, FNP  benzonatate (TESSALON PERLES) 100 MG capsule Take 1 capsule (100 mg total) by mouth 3 (three) times daily as needed. 03/28/17   Evelina Dun A, FNP  buPROPion (WELLBUTRIN XL) 300 MG 24 hr tablet Take 1 tablet (300 mg total) by mouth daily. 12/02/16   Emeterio Reeve, DO  cetirizine (ZYRTEC) 10 MG tablet Take 1 tablet (10 mg total) by mouth daily. 09/20/13   Marcial Pacas, DO  ciprofloxacin (CIPRO) 500 MG tablet Take 1 tablet (500 mg total) by mouth 2 (two) times daily. 06/16/17   Ashantia Amaral, [place] N, PA-C  clonazePAM (KLONOPIN) 0.5 MG tablet Half to full tab daily as needed for anxiety. 12/02/16 12/02/17  Emeterio Reeve, DO  cyclobenzaprine (FLEXERIL) 10 MG tablet One half tab PO qHS, then increase gradually to one tab  TID. 10/19/16   Silverio Decamp, MD  doxycycline (VIBRA-TABS) 100 MG tablet Take 1 tablet (100 mg total) by mouth 2 (two) times daily. 05/23/17   Withrow, Elyse Jarvis, FNP  eszopiclone (LUNESTA) 2 MG TABS tablet Take 0.5-1.5 tablets (1-3 mg total) by mouth at bedtime as needed for sleep. Take immediately before bedtime. Use lowest effective dose. 12/02/16   Emeterio Reeve, DO  fluticasone (FLONASE) 50 MCG/ACT nasal spray Place 2 sprays into both nostrils daily. 02/10/16   Dutch Quint B, FNP  IBUPROFEN PO Take 800  mg by mouth as needed.     [provider]  metroNIDAZOLE (FLAGYL) 500 MG tablet Take 1 tablet (500 mg total) by mouth 2 (two) times daily. 06/16/17   Salvator Seppala, [place] N, PA-C  Multiple Vitamin (MULTIVITAMIN) tablet Take 1 tablet by mouth daily.    [provider]  norethindrone (CAMILA) 0.35 MG tablet One by mouth daily. 07/02/16   Emeterio Reeve, DO  ondansetron (ZOFRAN ODT) 4 MG disintegrating tablet Take 1 tablet (4 mg total) by mouth every 8 (eight) hours as needed for nausea or vomiting. 06/16/17   Treg Diemer, [place] N, PA-C  pantoprazole (PROTONIX) 40 MG tablet Take 1 tablet (40 mg total) by mouth daily. 12/02/16   Emeterio Reeve, DO  predniSONE (DELTASONE) 10 MG tablet Take 3 tablets (30 mg total) by mouth daily with breakfast. 03/31/17   Gregor Hams, MD  predniSONE (DELTASONE) 5 MG tablet Take 1 tablet (5 mg total) by mouth as directed. Taper 6,5,4,3,2,1 05/23/17   Withrow, Elyse Jarvis, FNP  SUMAtriptan (IMITREX) 50 MG tablet Take 1-2 tablets (50-100 mg total) by mouth once as needed for migraine. May repeat in 2 hours if headache persists or recurs. Limit use to 200 mg (4 tablets) per 24 hours, use no more than 1-2 times per week 07/08/16   Emeterio Reeve, DO  traMADol (ULTRAM) 50 MG tablet Take 1 tablet (50 mg total) by mouth every 8 (eight) hours as needed (pain or cough). 03/31/17   Gregor Hams, MD  zolpidem (AMBIEN) 10 MG tablet TAKE 1/2 TO 1 TABLET BY MOUTH AT NIGHT FOR UP TO 8 HOURS OF SLEEP APPOINTMENT NEEDED FOR FURTHER REFILLS 04/21/16   Emeterio Reeve, DO    Family History Family History  Problem Relation Age of Onset  . Hypertension Mother   . Depression Mother   . Hypertension Father     Social History Social History   Tobacco Use  . Smoking status: Never Smoker  . Smokeless tobacco: Never Used  Substance Use Topics  . Alcohol use: Yes  . Drug use: No     Allergies   Crab [shellfish allergy]; Sulfa antibiotics; Zoloft [sertraline hcl]; and  Other   Review of Systems Review of Systems  Constitutional: Negative for chills and fever.  Gastrointestinal: Positive for abdominal pain, diarrhea and nausea. Negative for blood in stool and vomiting.  Genitourinary: Negative for dysuria, frequency, vaginal bleeding and vaginal discharge.  All other systems reviewed and are negative.    Physical Exam Updated Vital Signs BP 120/82 (BP Location: Left Arm)   Pulse 75   Temp 98.8 F (37.1 C) (Oral)   Resp 18   Ht 5' 5.5" (1.664 m)   Wt 81.6 kg (180 lb)   SpO2 100%   BMI 29.50 kg/m   Physical Exam  Constitutional: She appears well-developed and well-nourished. She does not appear ill. No distress.  HENT:  Head: Normocephalic and atraumatic.  Mouth/Throat: Oropharynx is clear  and moist.  Eyes: Conjunctivae are normal.  Cardiovascular: Normal rate, regular rhythm, normal heart sounds and intact distal pulses.  Pulmonary/Chest: Effort normal and breath sounds normal.  Abdominal: Soft. Normal appearance and bowel sounds are normal. There is tenderness (mild) in the suprapubic area and left lower quadrant. There is no rigidity, no rebound and no guarding.  Genitourinary: Uterus normal. There is no tenderness on the right labia. There is no tenderness on the left labia. Cervix exhibits no motion tenderness and no discharge. Right adnexum displays no mass, no tenderness and no fullness. Left adnexum displays no mass, no tenderness and no fullness. No erythema or tenderness in the vagina.  Genitourinary Comments: Exam performed with female chaperone present.  Scant white vaginal discharge.  IUD strings visible at cervical os.  Neurological: She is alert.  Skin: Skin is warm.  Psychiatric: She has a normal mood and affect. Her behavior is normal.  Nursing note and vitals reviewed.    ED Treatments / Results  Labs (all labs ordered are listed, but only abnormal results are displayed) Labs Reviewed  WET PREP, GENITAL - Abnormal;  Notable for the following components:      Result Value   Clue Cells Wet Prep HPF POC PRESENT (*)    WBC, Wet Prep HPF POC MODERATE (*)    All other components within normal limits  URINALYSIS, ROUTINE W REFLEX MICROSCOPIC - Abnormal; Notable for the following components:   APPearance HAZY (*)    Hgb urine dipstick TRACE (*)    Ketones, ur 15 (*)    All other components within normal limits  URINALYSIS, MICROSCOPIC (REFLEX) - Abnormal; Notable for the following components:   Bacteria, UA MANY (*)    Squamous Epithelial / LPF 6-30 (*)    All other components within normal limits  CBC WITH DIFFERENTIAL/PLATELET - Abnormal; Notable for the following components:   WBC 13.5 (*)    Neutro Abs 10.5 (*)    All other components within normal limits  BASIC METABOLIC PANEL - Abnormal; Notable for the following components:   CO2 20 (*)    All other components within normal limits  PREGNANCY, URINE  GC/CHLAMYDIA PROBE AMP ([place]) NOT AT [facility]    EKG  EKG Interpretation None       Radiology Ct Abdomen Pelvis W Contrast  Result Date: 06/16/2017 CLINICAL DATA:  Left lower quadrant abdominal pain. Nausea. Diarrhea today. IUD placement 3 weeks ago. EXAM: CT ABDOMEN AND PELVIS WITH CONTRAST TECHNIQUE: Multidetector CT imaging of the abdomen and pelvis was performed using the standard protocol following bolus administration of intravenous contrast. CONTRAST:  128m ISOVUE-300 IOPAMIDOL (ISOVUE-300) INJECTION 61% COMPARISON:  None. FINDINGS: Lower chest: Minimal lingular scarring or atelectasis. No consolidation or pleural fluid. Hepatobiliary: No focal liver abnormality is seen. No gallstones, gallbladder wall thickening, or biliary dilatation. Pancreas: No ductal dilatation or inflammation. Spleen: Normal in size without focal abnormality. Adrenals/Urinary Tract: Normal adrenal glands. No hydronephrosis or perinephric edema. Homogeneous renal enhancement with symmetric excretion on delayed phase  imaging. Urinary bladder is physiologically distended without wall thickening. Stomach/Bowel: Mild wall thickening of the distal sigmoid colon and rectum with possible mild pericolonic stranding. No additional colonic wall thickening. There is colonic tortuosity with moderate stool burden. Normal appendix. No small bowel dilatation, inflammation or wall thickening. Stomach physiologically distended. Vascular/Lymphatic: No significant vascular findings are present. No enlarged abdominal or pelvic lymph nodes. Reproductive: 3.4 cm low-density lesion in the left ovary slightly higher than simple fluid density.  No surrounding inflammatory change. Intrauterine device appropriately positioned in the uterus. Physiologic appearance of the right ovary. Trace free fluid in the pelvis. Other: No free air or intra-abdominal abscess. No upper abdominal ascites. Minimal fat containing umbilical hernia. Musculoskeletal: Degenerative disc disease at L4-L5. There are no acute or suspicious osseous abnormalities. IMPRESSION: 1. Mild wall thickening of the distal sigmoid colon and rectum with mild adjacent stranding suggesting colitis/proctocolitis. 2. Left ovarian cyst measuring 3.4 cm, possibly a hemorrhagic cyst. Given left lower quadrant pain, consider further evaluation with pelvic ultrasound. 3. Intrauterine device appropriately positioned in the uterus. Electronically Signed   By: Jeb Levering M.D.   On: 06/16/2017 22:40    Procedures Procedures (including critical care time)  Medications Ordered in ED Medications  ondansetron (ZOFRAN) injection 4 mg (4 mg Intravenous Given 06/16/17 1941)  morphine 2 MG/ML injection 2 mg (2 mg Intravenous Given 06/16/17 1941)  ketorolac (TORADOL) 30 MG/ML injection 30 mg (30 mg Intravenous Given 06/16/17 2108)  iopamidol (ISOVUE-300) 61 % injection 100 mL (100 mLs Intravenous Contrast Given 06/16/17 2231)  ondansetron (ZOFRAN) injection 4 mg (4 mg Intravenous Given 06/16/17 2257)      Initial Impression / Assessment and Plan / ED Course  I have reviewed the triage vital signs and the nursing notes.  Pertinent labs & imaging results that were available during my care of the patient were reviewed by me and considered in my medical decision making (see chart for details).  Clinical Course as of Jun 16 2309  Thu Jun 16, 2017  2256 CT results discussed with patient.  Shared decision-making was done regarding follow-up pelvic ultrasound about left ovarian cyst.  Patient has close GYN follow-up, and would prefer to follow-up outpatient.  Will treat for colitis with Cipro and Flagyl.  Flagyl will also cover BV as shown on wet prep.  Patient states pain is improved, nausea is slowly returning, however will we dose Zofran with anticipated discharge.   [JR]    Clinical Course User Index [JR] Dewaine Morocho, [place] N, PA-C    Pt presenting with acute LLQ abdominal pain, with N/V/D that began today. Pt without pelvic complaints, though concern for placement of new IUD. On exam, mild LLQ and suprapubic tenderness, abd soft without guarding. Pelvic exam with scant white discharge, no adnexal or CMT tenderness. Afebrile, nontoxic appearing. Labs with mild leukocytosis, electrolytes unremarkable. U/A neg for infection. Initially had shared discussion making regarding CT scan given reassuring pelvic. Pt elected for discharge with PCP follow-up, however changed her mind and elected for CT.  CT abdomen pelvis done, showing colitis, and left ovarian cyst.  Shared decision making discussion was made, for possibility of pelvic ultrasound.  Reassured based on patient's pelvic exam without adnexal or CMT tenderness.  Patient has close GYN follow-up, and would prefer to follow-up outpatient.  Will discharge with Cipro and Flagyl to cover for colitis.  Flagyl will also cover possible BV.  Wet prep, though exam not consistent with BV.  Patient tolerating p.o. fluids.  Follow-up discussed, and strict return  precautions discussed.  Safe for discharge.  Patient discussed with Dr. Sherry Ruffing, who agrees with care plan.  Discussed results, findings, treatment and follow up. Patient advised of return precautions. Patient verbalized understanding and agreed with plan.  Final Clinical Impressions(s) / ED Diagnoses   Final diagnoses:  Colitis  Left ovarian cyst    ED Discharge Orders        Ordered    ciprofloxacin (CIPRO) 500 MG tablet  2 times daily     06/16/17 2311    metroNIDAZOLE (FLAGYL) 500 MG tablet  2 times daily     06/16/17 2311    ondansetron (ZOFRAN ODT) 4 MG disintegrating tablet  Every 8 hours PRN     06/16/17 2311       Luisa Louk, [place] N, PA-C 06/16/17 2311    Tegeler, Gwenyth Allegra, MD 06/17/17 [phone]

## 2017-06-16 NOTE — Telephone Encounter (Signed)
Patient states began having abdominal pain with nausea about 3 hours ago; no vomiting or diarrhea; did have IUD placed recently but cannot check to see if strings or lower aspect in vagina; no bleeding. States no fever. Suggested she go to [facility] where [place] and scans are available at this time of day. She states she will do so.

## 2017-06-16 NOTE — ED Triage Notes (Addendum)
Abdominal pain x 4 hours. It started as crampy pain and feeling that she needs to have a BM. Nausea. She had an ICU placed 3 weeks ago.

## 2017-06-16 NOTE — ED Triage Notes (Deleted)
Pt states she had an IUD placed recently.

## 2017-06-16 NOTE — Telephone Encounter (Signed)
Would agree needs appointment/urgent evaluation

## 2017-06-16 NOTE — Discharge Instructions (Signed)
Please read instructions below. Drink clear liquids until your stomach feels better. Then, slowly introduce bland foods into your diet as tolerated, such as bread, rice, apples, bananas. You can take zofran every 8 hours as needed for nausea. Begin taking antibiotics, ciprofloxacin and Flagyl as prescribed until gone. You can take Tylenol or Advil as needed for pain. Follow up with your primary care to your diagnosis of colitis.  Follow-up with your gynecologist regarding your findings of left ovarian cyst. Return to the ER for severely worsening abdominal pain, fever, uncontrollable vomiting, or new or concerning symptoms.

## 2017-06-16 NOTE — ED Notes (Signed)
PO challenge completed per pt food and liquid makes her pain to increase and more nauseated. EDP to be notified.

## 2017-06-16 NOTE — ED Notes (Signed)
After given pt Toradol for pain, pt states she is willing to get the CT done. PA notified.

## 2017-06-17 ENCOUNTER — Telehealth: Payer: Self-pay

## 2017-06-17 ENCOUNTER — Ambulatory Visit (INDEPENDENT_AMBULATORY_CARE_PROVIDER_SITE_OTHER): Payer: No Typology Code available for payment source | Admitting: Physician Assistant

## 2017-06-17 ENCOUNTER — Encounter: Payer: Self-pay | Admitting: Physician Assistant

## 2017-06-17 VITALS — BP 114/74 | HR 90 | Temp 98.2°F | Ht 65.5 in | Wt 180.0 lb

## 2017-06-17 DIAGNOSIS — K529 Noninfective gastroenteritis and colitis, unspecified: Secondary | ICD-10-CM | POA: Insufficient documentation

## 2017-06-17 DIAGNOSIS — N83202 Unspecified ovarian cyst, left side: Secondary | ICD-10-CM | POA: Diagnosis not present

## 2017-06-17 LAB — GC/CHLAMYDIA PROBE AMP (~~LOC~~) NOT AT ARMC
Chlamydia: NEGATIVE
Neisseria Gonorrhea: NEGATIVE

## 2017-06-17 MED ORDER — HYDROCODONE-ACETAMINOPHEN 5-325 MG PO TABS: 1.0000 | ORAL_TABLET | Freq: Three times a day (TID) | ORAL | 0 refills | Status: AC | PRN

## 2017-06-17 MED ORDER — KETOROLAC TROMETHAMINE 30 MG/ML IJ SOLN
30.0000 mg | Freq: Once | INTRAMUSCULAR | Status: AC
  Administered 2017-06-17: 30 mg via INTRAMUSCULAR

## 2017-06-17 MED FILL — ONDANSETRON ODT 4 MG TABLET: 4 | 7 days supply | Qty: 20 | Fill #0

## 2017-06-17 MED FILL — metroNIDAZOLE 500 MG TABS: 500 | 7 days supply | Qty: 14 | Fill #0

## 2017-06-17 MED FILL — CIPROFLOXACIN HCL 500 MG TA: 500 | 7 days supply | Qty: 14 | Fill #0

## 2017-06-17 MED FILL — HYDROCODON-APAP 5-325: 5-325 | 5 days supply | Qty: 15 | Fill #0

## 2017-06-17 NOTE — Patient Instructions (Signed)
Colitis Colitis is inflammation of the colon. Colitis may last a short time (acute) or it may last a long time (chronic). What are the causes? This condition may be caused by:  Viruses.  Bacteria.  Reactions to medicine.  Certain autoimmune diseases, such as Crohn disease or ulcerative colitis.  What are the signs or symptoms? Symptoms of this condition include:  Diarrhea.  Passing bloody or tarry stool.  Pain.  Fever.  Vomiting.  Tiredness (fatigue).  Weight loss.  Bloating.  Sudden increase in abdominal pain.  Having fewer bowel movements than usual.  How is this diagnosed? This condition is diagnosed with a stool test or a blood test. You may also have other tests, including X-rays, a CT scan, or a colonoscopy. How is this treated? Treatment may include:  Resting the bowel. This involves not eating or drinking for a period of time.  Fluids that are given through an IV tube.  Medicine for pain and diarrhea.  Antibiotic medicines.  Cortisone medicines.  Surgery.  Follow these instructions at home: Eating and drinking  Follow instructions from your health care provider about eating or drinking restrictions.  Drink enough fluid to keep your urine clear or pale yellow.  Work with a dietitian to determine which foods cause your condition to flare up.  Avoid foods that cause flare-ups.  Eat a well-balanced diet. Medicines  Take over-the-counter and prescription medicines only as told by your health care provider.  If you were prescribed an antibiotic medicine, take it as told by your health care provider. Do not stop taking the antibiotic even if you start to feel better. General instructions  Keep all follow-up visits as told by your health care provider. This is important. Contact a health care provider if:  Your symptoms do not go away.  You develop new symptoms. Get help right away if:  You have a fever that does not go away with  treatment.  You develop chills.  You have extreme weakness, fainting, or dehydration.  You have repeated vomiting.  You develop severe pain in your abdomen.  You pass bloody or tarry stool. This information is not intended to replace advice given to you by your health care provider. Make sure you discuss any questions you have with your health care provider. Document Released: 04/22/2004 Document Revised: 08/21/2015 Document Reviewed: 07/08/2014 Elsevier Interactive Patient Education  2018 [employer]vier Inc[employer]  

## 2017-06-17 NOTE — Telephone Encounter (Signed)
Spoke with Pt, her pain is still persistent. Added to an acute slot opening today.

## 2017-06-17 NOTE — Telephone Encounter (Signed)
As per pt - she went to [facility], had CT scan done. She was informed she has colitis, vaginal bacteria infection & ovarian cyst on left ovary. She was prescribed zofran, metronidazole & cipro. Pt states she is still in pain & is requesting a pain rx to be sent to local pharmacy. Pls advise, thanks.

## 2017-06-17 NOTE — Telephone Encounter (Signed)
Tracey Patterson called and left a message yesterday at 4:24 pm about having abdominal pain. After reviewing her chart I noticed she went to the ED. I called and left a message for her to call back so we can schedule her an appointment.

## 2017-06-17 NOTE — Progress Notes (Signed)
   Subjective:    Patient ID: Tracey Patterson, female    DOB: 10-Oct-1980, 37 y.o.   MRN: [id number]  HPI  Patient is a 37 year old female who presents today with lower abdominal pain. She was seen at [facility] ED last night. She had a workup which showed some colitis, an ovarian cyst measuring 3.4 cm, and bacterial vaginosis. She was prescribed cipro, flagyl, and zofran. She has only been able to take 1 dose of the antibiotics today. She is complaining of continued pain in the lower abdomen diffusely worse on the suprapubic and left side. She denies any urinary symptoms, flank pain, fevers, but does have nausea and diarrhea and mild chills. She denies any blood in the stool.  .. Past Medical History:  Diagnosis Date  . Anxiety   . Asthma   . History of TMJ disorder      Review of Systems  Constitutional: Positive for chills. Negative for activity change, fatigue and fever.  Cardiovascular: Negative for chest pain and palpitations.  Gastrointestinal: Positive for abdominal pain, diarrhea and nausea. Negative for anal bleeding, blood in stool, constipation and vomiting.  Genitourinary: Negative for dysuria, flank pain and vaginal bleeding.  Musculoskeletal: Positive for back pain. Negative for myalgias.       Objective:   Physical Exam  Constitutional: She is oriented to person, place, and time. She appears well-developed and well-nourished.  HENT:  Head: Normocephalic and atraumatic.  Eyes: Conjunctivae are normal.  Neck: Normal range of motion.  Cardiovascular: Normal rate, regular rhythm and normal heart sounds.  Pulmonary/Chest: Effort normal and breath sounds normal. No respiratory distress.  Abdominal: Bowel sounds are normal. There is tenderness ( lower abdomen diffusely, suprapubic/LLQ > RLQ. No guarding, no rebound,).  Neurological: She is alert and oriented to person, place, and time.  Skin: Skin is warm and dry.  Psychiatric: She has a normal mood and affect. Her  behavior is normal.          Assessment & Plan:  Marland KitchenMarland KitchenDiagnoses and all orders for this visit:  Colitis -     HYDROcodone-acetaminophen (NORCO/VICODIN) 5-325 MG tablet; Take 1 tablet by mouth every 8 (eight) hours as needed for up to 5 days for moderate pain. -     ketorolac (TORADOL) 30 MG/ML injection 30 mg  Left ovarian cyst -     HYDROcodone-acetaminophen (NORCO/VICODIN) 5-325 MG tablet; Take 1 tablet by mouth every 8 (eight) hours as needed for up to 5 days for moderate pain. -     ketorolac (TORADOL) 30 MG/ML injection 30 mg   Reassured patient that I would not expect pain to have been resolved with one dose of antibiotics. Continue cipro and flagyl. Pt has GYN appt on Tuesday. If not improving consider u/s to follow up on cyst. Discussed most cyst resolved on their own. Shot of toradol for pain as well and short supply of norco. Discussed controlled substance and abuse potential. Pt written out of work for the weekend and encouraged to rest and hydrate. Follow up as needed or with any red flags or worsening pain, bloody stools, body aches, chills.   Marland Kitchen.Spent 30 minutes with patient and greater than 50 percent of visit spent counseling patient regarding treatment plan and review of records due to recent ED visit.

## 2017-06-17 NOTE — Telephone Encounter (Signed)
Called pt for an update. No answer, left a brief vm for pt to return call back at their convenience. Call back info provided.

## 2017-06-17 NOTE — Telephone Encounter (Signed)
Will not send pain meds without visit - would route to triage and recommend appointment or route to provider in office

## 2017-06-22 ENCOUNTER — Ambulatory Visit: Payer: Self-pay | Admitting: Osteopathic Medicine

## 2017-06-23 ENCOUNTER — Telehealth: Payer: Self-pay | Admitting: Osteopathic Medicine

## 2017-06-23 NOTE — Telephone Encounter (Signed)
Received FMLA forms for this patient. I have no instructions on why she might need these filled out. Looks like she recently saw [facility] for colitis and ovarian cyst. If forms are in regard to that visit, can give these to [place]. If other reason, she will need a follow-up appointment. (I put forms in your box for now, vanicia)

## 2017-06-23 NOTE — Telephone Encounter (Signed)
Thanks

## 2017-06-23 NOTE — Telephone Encounter (Signed)
As per pt - FMLA form is in regards to missed days at work due to colitis & ovarian cyst. Form will be placed in Jade's box for completion. *Jade - pls refer to previous note from Dr. Sheppard Coil. Thanks.

## 2017-06-27 NOTE — Telephone Encounter (Signed)
Done. Placed in your box vanicia.

## 2017-06-27 NOTE — Telephone Encounter (Signed)
FMLA paperwork faxed to [phone]. Confirmation rec'd. Left vm msg updating pt. Call back information provided.

## 2017-07-18 ENCOUNTER — Encounter: Payer: Self-pay | Admitting: Family Medicine

## 2017-07-18 ENCOUNTER — Ambulatory Visit (INDEPENDENT_AMBULATORY_CARE_PROVIDER_SITE_OTHER): Payer: No Typology Code available for payment source | Admitting: Family Medicine

## 2017-07-18 VITALS — BP 120/78 | HR 86 | Temp 98.0°F | Resp 18 | Wt 183.0 lb

## 2017-07-18 DIAGNOSIS — M25512 Pain in left shoulder: Secondary | ICD-10-CM

## 2017-07-18 DIAGNOSIS — M24152 Other articular cartilage disorders, left hip: Secondary | ICD-10-CM

## 2017-07-18 DIAGNOSIS — S39012A Strain of muscle, fascia and tendon of lower back, initial encounter: Secondary | ICD-10-CM | POA: Diagnosis not present

## 2017-07-18 DIAGNOSIS — M1612 Unilateral primary osteoarthritis, left hip: Secondary | ICD-10-CM

## 2017-07-18 MED ORDER — CYCLOBENZAPRINE HCL 5 MG PO TABS: 5.0000 mg | ORAL_TABLET | Freq: Three times a day (TID) | ORAL | 1 refills | Status: DC | PRN

## 2017-07-18 MED FILL — CYCLOBENZAPRINE 5 MG TABLET: 5 | 5 days supply | Qty: 30 | Fill #0

## 2017-07-18 MED FILL — PANTOPRAZOLE SOD DR 40 MG T: 40 | 90 days supply | Qty: 90 | Fill #1

## 2017-07-18 NOTE — Patient Instructions (Addendum)
Thank you for coming in today. Attend PT.  Use the muscle relaxer mostly at night.  Recheck with me in 4-6 weeks.  Return sooner in needed.   Use tramadol sparingly.  2 aleve 2x daily is the prescription dose.   Use a heating pad and tens unit.  TENS UNIT: This is helpful for muscle pain and spasm.   Search and Purchase a TENS 7000 2nd edition at  www.tenspros.com or www.Sedan.com It should be less than $30.     TENS unit instructions: Do not shower or bathe with the unit on Turn the unit off before removing electrodes or batteries If the electrodes lose stickiness add a drop of water to the electrodes after they are disconnected from the unit and place on plastic sheet. If you continued to have difficulty, call the TENS unit company to purchase more electrodes. Do not apply lotion on the skin area prior to use. Make sure the skin is clean and dry as this will help prolong the life of the electrodes. After use, always check skin for unusual red areas, rash or other skin difficulties. If there are any skin problems, does not apply electrodes to the same area. Never remove the electrodes from the unit by pulling the wires. Do not use the TENS unit or electrodes other than as directed. Do not change electrode placement without consultating your therapist or physician. Keep 2 fingers with between each electrode. Wear time ratio is 2:1, on to off times.    For example on for 30 minutes off for 15 minutes and then on for 30 minutes off for 15 minutes      Lumbosacral Strain Lumbosacral strain is an injury that causes pain in the lower back (lumbosacral spine). This injury usually occurs from overstretching the muscles or ligaments along your spine. A strain can affect one or more muscles or cord-like tissues that connect bones to other bones (ligaments). What are the causes? This condition may be caused by:  A hard, direct hit (blow) to the back.  Excessive stretching of the  lower back muscles. This may result from: ? A fall. ? Lifting something heavy. ? Repetitive movements such as bending or crouching.  What increases the risk? The following factors may increase your risk of getting this condition:  Participating in sports or activities that involve: ? A sudden twist of the back. ? Pushing or pulling motions.  Being overweight or obese.  Having poor strength and flexibility, especially tight hamstrings or weak muscles in the back or abdomen.  Having too much of a curve in the lower back.  Having a pelvis that is tilted forward.  What are the signs or symptoms? The main symptom of this condition is pain in the lower back, at the site of the strain. Pain may extend (radiate) down one or both legs. How is this diagnosed? This condition is diagnosed based on:  Your symptoms.  Your medical history.  A physical exam. ? Your health care provider may push on certain areas of your back to determine the source of your pain. ? You may be asked to bend forward, backward, and side to side to assess the severity of your pain and your range of motion.  Imaging tests, such as: ? X-rays. ? MRI.  How is this treated? Treatment for this condition may include:  Putting heat and cold on the affected area.  Medicines to help relieve pain and relax your muscles (muscle relaxants).  NSAIDs to help reduce  swelling and discomfort.  When your symptoms improve, it is important to gradually return to your normal routine as soon as possible to reduce pain, avoid stiffness, and avoid loss of muscle strength. Generally, symptoms should improve within 6 weeks of treatment. However, recovery time varies. Follow these instructions at home: Managing pain, stiffness, and swelling   If directed, put ice on the injured area during the first 24 hours after your strain. ? Put ice in a plastic bag. ? Place a towel between your skin and the bag. ? Leave the ice on for 20  minutes, 2-3 times a day.  If directed, put heat on the affected area as often as told by your health care provider. Use the heat source that your health care provider recommends, such as a moist heat pack or a heating pad. ? Place a towel between your skin and the heat source. ? Leave the heat on for 20-30 minutes. ? Remove the heat if your skin turns bright red. This is especially important if you are unable to feel pain, heat, or cold. You may have a greater risk of getting burned. Activity  Rest and return to your normal activities as told by your health care provider. Ask your health care provider what activities are safe for you.  Avoid activities that take a lot of energy for as long as told by your health care provider. General instructions  Take over-the-counter and prescription medicines only as told by your health care provider.  Donot drive or use heavy machinery while taking prescription pain medicine.  Do not use any products that contain nicotine or tobacco, such as cigarettes and e-cigarettes. If you need help quitting, ask your health care provider.  Keep all follow-up visits as told by your health care provider. This is important. How is this prevented?  Use correct form when playing sports and lifting heavy objects.  Use good posture when sitting and standing.  Maintain a healthy weight.  Sleep on a mattress with medium firmness to support your back.  Be safe and responsible while being active to avoid falls.  Do at least 150 minutes of moderate-intensity exercise each week, such as brisk walking or water aerobics. Try a form of exercise that takes stress off your back, such as swimming or stationary cycling.  Maintain physical fitness, including: ? Strength. ? Flexibility. ? Cardiovascular fitness. ? Endurance. Contact a health care provider if:  Your back pain does not improve after 6 weeks of treatment.  Your symptoms get worse. Get help right away  if:  Your back pain is severe.  You cannot stand or walk.  You have difficulty controlling when you urinate or when you have a bowel movement.  You feel nauseous or you vomit.  Your feet get very cold.  You have numbness, tingling, weakness, or problems using your arms or legs.  You develop any of the following: ? Shortness of breath. ? Dizziness. ? Pain in your legs. ? Weakness in your buttocks or legs. ? Discoloration of the skin on your toes or legs. This information is not intended to replace advice given to you by your health care provider. Make sure you discuss any questions you have with your health care provider. Document Released: 12/23/2004 Document Revised: 10/03/2015 Document Reviewed: 08/17/2015 Elsevier Interactive Patient Education  [employer].

## 2017-07-18 NOTE — Progress Notes (Signed)
Tracey Patterson is a 37 y.o. female who presents to [facility] today for back pain and left shoulder pain.  Tracey Patterson notes ongoing exacerbation of her chronic low back pain.  Her surgical and medical history is significant for discectomy for lumbar radiculopathy several years ago.  As a result of that she has had some chronic low back pain that is typically more mild.  However over the last several weeks her pain has been worsening and it worsened again recently.  She denies any radiating pain weakness or numbness fevers or chills.  She fell over the weekend landing on her buttocks.  She is not sure if she extended her left hand during the fall.  She notes mild left shoulder pain and worsening.  She notes that her symptoms have not dramatically changed and again she denies any weakness or numbness or bowel bladder dysfunction.  She denies any midline pain.  She is tried over-the-counter medications which have helped a bit.  No fevers or chills.  She notes some left shoulder pain which she rates as mild worse with overhead motion and at night.  She denies any weakness or numbness.   Past Medical History:  Diagnosis Date  . Anxiety   . Asthma   . History of TMJ disorder    Past Surgical History:  Procedure Laterality Date  . BACK SURGERY  December 2015  . KNEE ARTHROSCOPY  1998   left  . LAPAROSCOPIC TUBAL LIGATION  02/28/2012   Procedure: LAPAROSCOPIC TUBAL LIGATION;  Surgeon: Cyril Mourning, MD;  Location: [facility];  Service: Gynecology;  Laterality: Bilateral;  . TONSILLECTOMY     Social History   Tobacco Use  . Smoking status: Never Smoker  . Smokeless tobacco: Never Used  Substance Use Topics  . Alcohol use: Yes     ROS:  As above   Medications: Current Outpatient Medications  Medication Sig Dispense Refill  . Acetaminophen (TYLENOL ARTHRITIS PAIN PO) Take by mouth.    Marland Kitchen albuterol (PROVENTIL HFA;VENTOLIN HFA) 108 (90 Base)  MCG/ACT inhaler Inhale 2 puffs into the lungs every 4 (four) hours as needed for wheezing (cough, shortness of breath or wheezing.). 1 Inhaler 1  . buPROPion (WELLBUTRIN XL) 300 MG 24 hr tablet Take 1 tablet (300 mg total) by mouth daily. 90 tablet 3  . cetirizine (ZYRTEC) 10 MG tablet Take 1 tablet (10 mg total) by mouth daily. 30 tablet 3  . clonazePAM (KLONOPIN) 0.5 MG tablet Half to full tab daily as needed for anxiety. 90 tablet 1  . cyclobenzaprine (FLEXERIL) 5 MG tablet Take 1-2 tablets (5-10 mg total) by mouth 3 (three) times daily as needed for muscle spasms. 30 tablet 1  . eszopiclone (LUNESTA) 2 MG TABS tablet Take 0.5-1.5 tablets (1-3 mg total) by mouth at bedtime as needed for sleep. Take immediately before bedtime. Use lowest effective dose. (Patient not taking: Reported on 06/17/2017) 30 tablet 1  . fluticasone (FLONASE) 50 MCG/ACT nasal spray Place 2 sprays into both nostrils daily. 16 g 6  . IBUPROFEN PO Take 800 mg by mouth as needed.     . pantoprazole (PROTONIX) 40 MG tablet Take 1 tablet (40 mg total) by mouth daily. 90 tablet 3  . SUMAtriptan (IMITREX) 50 MG tablet Take 1-2 tablets (50-100 mg total) by mouth once as needed for migraine. May repeat in 2 hours if headache persists or recurs. Limit use to 200 mg (4 tablets) per 24 hours, use no  more than 1-2 times per week 30 tablet 0  . traMADol (ULTRAM) 50 MG tablet Take 1 tablet (50 mg total) by mouth every 8 (eight) hours as needed (pain or cough). 20 tablet 0   No current facility-administered medications for this visit.    Allergies  Allergen Reactions  . Crab [Shellfish Allergy] Other (See Comments)    Allergist advises against eating crab.  . Sulfa Antibiotics Hives  . Zoloft [Sertraline Hcl]     Hair loss  . Other Rash    Dermabond, steri strips     Exam:  BP 120/78 (BP Location: Left Arm, Patient Position: Sitting, Cuff Size: Large)   Pulse 86   Temp 98 F (36.7 C) (Oral)   Resp 18   Wt 183 lb (83 kg)    SpO2 99%   BMI 29.99 kg/m  General: Well Developed, well nourished, and in no acute distress.  Neuro/Psych: Alert and oriented x3, extra-ocular muscles intact, able to move all 4 extremities, sensation grossly intact. Skin: Warm and dry, no rashes noted.  Respiratory: Not using accessory muscles, speaking in full sentences, trachea midline.  Cardiovascular: Pulses palpable, no extremity edema. Abdomen: Does not appear distended. MSK:  C-spine nontender to midline.  Normal neck motion. Left shoulder: Normal-appearing nontender.  Pain with abduction. Strength is intact. Mildly positive Hawkins and Neer's test. Extremity reflexes and sensation are intact.  L-spine: Nontender to spinal midline. Tender to palpation bilateral paraspinal muscles. L-spine motion significantly limited by pain.  Lower extremity strength reflexes and sensation are intact.  CT scan of the abdomen showing L-spine from March. This shows DDD at L4/5 and shows right facet DJD L4/5. I independently reviewed the images.      Assessment and Plan: 37 y.o. female with  L-spine pain: Acute exacerbation of chronic pain due to myofascial disruption.  Despite recent fall no evidence of fracture on today's exam.  Plan for trial of physical therapy heating pad muscle relaxers.. Recheck in 6 weeks.  Return sooner if needed.  Left shoulder pain: Likely rotator cuff strain doubtful for tear based on intact strength.  Again physical therapy should be helpful.  Recheck in a few weeks.      Orders Placed This Encounter  Procedures  . Ambulatory referral to Physical Therapy    Referral Priority:   Routine    Referral Type:   Physical Medicine    Referral Reason:   Specialty Services Required    Requested Specialty:   Physical Therapy   Meds ordered this encounter  Medications  . cyclobenzaprine (FLEXERIL) 5 MG tablet    Sig: Take 1-2 tablets (5-10 mg total) by mouth 3 (three) times daily as needed for muscle spasms.      Dispense:  30 tablet    Refill:  1    Discussed warning signs or symptoms. Please see discharge instructions. Patient expresses understanding.

## 2017-08-03 ENCOUNTER — Ambulatory Visit (INDEPENDENT_AMBULATORY_CARE_PROVIDER_SITE_OTHER): Payer: No Typology Code available for payment source | Admitting: Physical Therapy

## 2017-08-03 ENCOUNTER — Encounter: Payer: Self-pay | Admitting: Physical Therapy

## 2017-08-03 DIAGNOSIS — M62838 Other muscle spasm: Secondary | ICD-10-CM

## 2017-08-03 DIAGNOSIS — M6281 Muscle weakness (generalized): Secondary | ICD-10-CM

## 2017-08-03 DIAGNOSIS — M25512 Pain in left shoulder: Secondary | ICD-10-CM

## 2017-08-03 DIAGNOSIS — G8929 Other chronic pain: Secondary | ICD-10-CM

## 2017-08-03 DIAGNOSIS — M5441 Lumbago with sciatica, right side: Secondary | ICD-10-CM

## 2017-08-03 NOTE — Therapy (Signed)
[facility] [place], [place], [address] Phone: [phone]   Fax:  [phone]  Physical Therapy Evaluation  Patient Details  Name: Tracey Patterson MRN: [id number] Date of Birth: Mar 26, 1981 Referring Provider: Dr [name]   Encounter Date: 08/03/2017  PT End of Session - 08/03/17 0847    Visit Number  1    Number of Visits  12    Date for PT Re-Evaluation  09/14/17    PT Start Time  0165    PT Stop Time  0949    PT Time Calculation (min)  62 min       Past Medical History:  Diagnosis Date  . Anxiety   . Asthma   . History of TMJ disorder     Past Surgical History:  Procedure Laterality Date  . BACK SURGERY  December 2015  . KNEE ARTHROSCOPY  1998   left  . LAPAROSCOPIC TUBAL LIGATION  02/28/2012   Procedure: LAPAROSCOPIC TUBAL LIGATION;  Surgeon: Cyril Mourning, MD;  Location: [facility];  Service: Gynecology;  Laterality: Bilateral;  . TONSILLECTOMY      There were no vitals filed for this visit.   Subjective Assessment - 08/03/17 0847    Subjective  Pt reports she began having increase in LBP several weeks ago.  On Easter Sunday the dog pulled her and she fell down the steps.  The arm is doing better however the back is still really sore. Currently she has been trying to stretch.  Has joined a gym and is trying to go a couple times a week.     Pertinent History  lumbar surgery 3 yrs ago, fibromyalgia    How long can you sit comfortably?  > 5'    How long can you stand comfortably?  < 5'    How long can you walk comfortably?  occassionally limited - sometimes it helps and then others not so good.     Diagnostic tests  CT scan shows arthritis in her back around the surgery site.     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  Yes    Pain Score  6     Pain Location  Back    Pain Orientation  Right;Lower     Pain Descriptors / Indicators  Sharp;Dull;Aching    Pain Onset  More than a month ago    Pain Frequency  Constant    Aggravating Factors   sitting , bending, picking up a dog    Pain Relieving Factors  meds, heat, hot bath         [facility] PT Assessment - 08/03/17 0001      Assessment   Medical Diagnosis  Lumbosacral sprain, Lt shoulder pain    Referring Provider  Dr [name]    Onset Date/Surgical Date  07/06/17    Hand Dominance  Right    Next MD Visit  june    Prior Therapy  yes      Precautions   Precautions  None      Balance Screen   Has the patient fallen in the past 6 months  Yes    How many times?  1 on Easter    Has the patient had a decrease in activity level because of a fear of falling?   No    Is the patient  reluctant to leave their home because of a fear of falling?   No      Prior Function   Level of Independence  -- I all except for unhooking bra behind back    Vocation  Full time employment    Vocation Requirements  x-ray tech    Leisure  read, walk, watch tv      Observation/Other Assessments   Focus on Therapeutic Outcomes (FOTO)   49% limited      Posture/Postural Control   Posture/Postural Control  Postural limitations    Postural Limitations  Forward head;Rounded Shoulders;Flexed trunk      ROM / Strength   AROM / PROM / Strength  AROM;Strength      AROM   Overall AROM Comments  pain around medial clavicle and wraps around shoulder LT     AROM Assessment Site  Shoulder;Cervical;Lumbar    Right/Left Shoulder  -- WNL, pain Lt abduction and reaching behind back     Cervical Flexion  WNL    Cervical Extension  WNL    Cervical - Right Rotation  WNL     Cervical - Left Rotation  WNL    Lumbar Flexion  to top of knees Rt side pain    Lumbar Extension  WNL    Lumbar - Right Side Bend  WNL    Lumbar - Left Side Bend  75% present, Rt side low back pain    Lumbar - Right Rotation  WNL    Lumbar - Left Rotation  50% present with Rt side LBP       Strength   Overall Strength Comments  mid trap 4-/5 Lt with pain, Rt 5-/5    Strength Assessment Site  Shoulder;Elbow;Hip;Lumbar    Right/Left Shoulder  -- Rt WNL, Lt 4/5 throughout with pain    Right/Left Elbow  -- WNL    Right/Left Hip  Left Rt WNL    Left Hip Flexion  4/5    Left Hip Extension  5/5    Left Hip ABduction  4+/5    Lumbar Flexion  -- TA fair    Lumbar Extension  -- multifidi fair      Palpation   Spinal mobility  pain and hypomobile Rt L3-4 UPA.     Palpation comment  tight and tender in Lt gluts/piriformis, palpable tightness in Lt lumbar paraspinals compared to Rt however not painful                Objective measurements completed on examination: See above findings.      [facility] Adult PT Treatment/Exercise - 08/03/17 0001      Exercises   Exercises  Lumbar;Shoulder      Lumbar Exercises: Stretches   Other Lumbar Stretch Exercise  10 reps cat/cow, 1 rep each, 30 sec childs pose,then stretch to each side.       Lumbar Exercises: Prone   Other Prone Lumbar Exercises  10x5sec pelvic press, then upper body lifts with arms in goal post position      Shoulder Exercises: Supine   External Rotation  Strengthening;Both;20 reps;Theraband    Theraband Level (Shoulder External Rotation)  Level 2 (Red)      Modalities   Modalities  Cryotherapy;Electrical Stimulation;Moist Heat      Moist Heat Therapy   Number Minutes Moist Heat  20 Minutes    Moist Heat Location  Lumbar Spine      Cryotherapy   Number Minutes Cryotherapy  20 Minutes    Cryotherapy  Location  Shoulder Lt    Type of Cryotherapy  Ice pack      Electrical Stimulation   Electrical Stimulation Location  lumbar and Lt shoulder    Electrical Stimulation Action  premod    Electrical Stimulation Parameters  to tolerance    Electrical Stimulation Goals  Pain      Manual Therapy   Manual Therapy  Joint mobilization    Joint Mobilization  grade III Rt UPA mobs L3-4              PT  Education - 08/03/17 1533    Education provided  Yes    Education Details  HEP    Person(s) Educated  Patient    Methods  Explanation;Demonstration;Handout    Comprehension  Returned demonstration;Verbalized understanding          PT Long Term Goals - 08/03/17 1539      PT LONG TERM GOAL #1   Title  I with advanced HEP ( 09/14/17)    Time  6    Period  Weeks    Status  New      PT LONG TERM GOAL #2   Title  demo painfree Lumbar ROM WFL ( 09/14/17)      Time  6    Period  Weeks    Status  On-going      PT LONG TERM GOAL #3   Title  improve Lt shoulder strength =/> 5-/5 to allow her to perform her work without difficulty ( 09/14/17)    Time  6    Period  Weeks    Status  New      PT LONG TERM GOAL #4   Title  improve Lt hip strength to [facility] to allow her walk at work with out difficulty  ( 09/14/17)     Time  6    Period  Weeks    Status  New      PT LONG TERM GOAL #5   Title  report =/> 75% reduction of back pain at the end of a work shift ( 09/14/17)     Time  6    Period  Weeks    Status  New      Additional Long Term Goals   Additional Long Term Goals  Yes      PT LONG TERM GOAL #6   Title  improve FOTO =/< 405 limited ( 09/14/17)     Time  6    Period  Weeks    Status  New             Plan - 08/03/17 1534    Clinical Impression Statement  37 yo female with low back pain and Lt shoulder pain after being pulled down the stairs by her dog.  Her shoulder has been feeling better however she is still having a fair amount of back pain around her old surgical site. She presents with decreased lumbar ROM, decreased strength in her Lt shoulder Lt LE and core. She also has tightness in her lumbar paraspinals and Rt gluts.  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     History and Personal Factors relevant to plan of care:  lumbar HNP 2015, knee scope 1998, anxiety, myalgia and others on snap shot.      Clinical Presentation  Evolving     Clinical Decision Making  Moderate    Rehab Potential  Good    PT  Frequency  2x / week    PT Duration  6 weeks    PT Treatment/Interventions  Iontophoresis 11m/ml Dexamethasone;Dry needling;Manual techniques;Moist Heat;Traction;Ultrasound;Therapeutic activities;Patient/family education;Taping;Vasopneumatic Device;Therapeutic exercise;Cryotherapy;Passive range of motion    PT Next Visit Plan  manual work possible DN to Rt gluts/low back, progresss Lt shoulder RTC strength, core strength and modalities PRN    Consulted and Agree with Plan of Care  Patient       Patient will benefit from skilled therapeutic intervention in order to improve the following deficits and impairments:  Pain, Improper body mechanics, Postural dysfunction, Increased muscle spasms, Decreased strength, Decreased range of motion, Impaired flexibility  Visit Diagnosis: Chronic left shoulder pain - Plan: PT plan of care cert/re-cert  Muscle weakness (generalized) - Plan: PT plan of care cert/re-cert  Other muscle spasm - Plan: PT plan of care cert/re-cert  Chronic right-sided low back pain with right-sided sciatica - Plan: PT plan of care cert/re-cert     Problem List Patient Active Problem List   Diagnosis Date Noted  . Colitis 06/17/2017  . Left ovarian cyst 06/17/2017  . Left shoulder pain 09/07/2016  . Degenerative tear of acetabular labrum of left hip 07/15/2016  . Ganglion cyst 02/17/2016  . Generalized anxiety disorder 02/17/2016  . Recurrent sinusitis 02/17/2016  . Vitamin D deficiency 02/15/2014  . Asthma, chronic 12/14/2012  . GERD (gastroesophageal reflux disease) 12/14/2012  . Seasonal allergies 12/14/2012  . Anxiety and depression 12/14/2012  . Transaminitis 12/14/2012  . Myalgia and myositis 10/30/2012  . Other malaise and fatigue 10/26/2012  . Flushing 10/26/2012  . Insomnia 08/13/2012  . Stress and adjustment reaction 08/13/2012    SJeral PinchPT 08/03/2017, 3:47 PM  [facility] [place] [address]Phone: [phone]  Fax:  [phone] Name: Tracey HolikMRN: [id number] of Birth: 226-Mar-1982

## 2017-08-03 NOTE — Patient Instructions (Addendum)
Pelvic Press    Place hands under belly between navel and pubic bone, palms up. Feel pressure on hands. Increase pressure on hands by pressing pelvis down. This is NOT a pelvic tilt. Hold __5_ seconds. Relax. Repeat _10__ times. Once a day.    Shoulder Blade Squeeze: W    Arms out to sides at 90 palms down. Bend elbows to 90. Press pelvis down. Squeeze backbone with shoulder blades. Raise arms, front of shoulders, chest, and head. Keep neck neutral. Hold _5__ seconds. Relax. Repeat _10__ times. Once a day.   Cat / Cow Flow    Inhale, press spine toward ceiling like a Halloween cat. Keeping strength in arms and abdominals, exhale to soften spine through neutral and into cow pose. Open chest and arch back. Initiate movement between cat and cow at tailbone, one vertebrae at a time. Repeat __10__ times. Once a day.   BACK: Child's Pose (Sciatica)    Sit in knee-chest position and reach arms forward. Separate knees for comfort. Hold position for _30__sec. Repeat _1-2__ times. Do _1__ times per day. Then cross one hand over the other and hold, repeat to the other side.    Shoulder Rotation: Double Arm    On back, knees bent, feet flat, elbows tucked at sides, bent 90, hands palms up. Pull hands apart and down toward floor, keeping elbows near sides. Hold momentarily. Slowly return to starting position. Repeat _10-20__ times. Band color ______   Keep doing doorway stretches

## 2017-08-05 ENCOUNTER — Encounter: Payer: Self-pay | Admitting: Family Medicine

## 2017-08-09 ENCOUNTER — Encounter: Payer: Self-pay | Admitting: Physical Therapy

## 2017-08-09 ENCOUNTER — Ambulatory Visit (INDEPENDENT_AMBULATORY_CARE_PROVIDER_SITE_OTHER): Payer: No Typology Code available for payment source | Admitting: Physical Therapy

## 2017-08-09 DIAGNOSIS — M25512 Pain in left shoulder: Secondary | ICD-10-CM | POA: Diagnosis not present

## 2017-08-09 DIAGNOSIS — M62838 Other muscle spasm: Secondary | ICD-10-CM

## 2017-08-09 DIAGNOSIS — G8929 Other chronic pain: Secondary | ICD-10-CM | POA: Diagnosis not present

## 2017-08-09 DIAGNOSIS — M5441 Lumbago with sciatica, right side: Secondary | ICD-10-CM | POA: Diagnosis not present

## 2017-08-09 DIAGNOSIS — M6281 Muscle weakness (generalized): Secondary | ICD-10-CM

## 2017-08-09 NOTE — Patient Instructions (Addendum)
Flexors, Sitting / Standing    Stand or sit, head in comfortable, centered position. Draw chin in, pulling head straight back, keeping jaw and eyes level. Hold __5_ seconds. Repeat _5-10__ times per session. Do __as needed for neck stiffness_ sessions per day.  Upper Limb Neural Tension I, Standing, after holding this position, turn chin towards arm pit for deeper stretch.     Stand, one hand over top of head, other hand against low back. Turn head down toward pulling side. Gently increase stretch by pulling on head and depressing opposite (stretch-side) shoulder blade. Hold __20-30_ seconds. Repeat _1-2__ times per session. Do _as needed for neck stiffness__ sessions per day.

## 2017-08-09 NOTE — Therapy (Signed)
[place], [place], [address] Phone: [phone]   Fax:  [phone]  Physical Therapy Treatment  Patient Details  Name: Tracey Patterson MRN: [id number] Date of Birth: 1980-11-03 Referring Provider: Dr [name]   Encounter Date: 08/09/2017  PT End of Session - 08/09/17 1105    Visit Number  2    Number of Visits  12    Date for PT Re-Evaluation  09/14/17    PT Start Time  1105    PT Stop Time  1157    PT Time Calculation (min)  52 min    Activity Tolerance  Patient tolerated treatment well       Past Medical History:  Diagnosis Date  . Anxiety   . Asthma   . History of TMJ disorder     Past Surgical History:  Procedure Laterality Date  . BACK SURGERY  December 2015  . KNEE ARTHROSCOPY  1998   left  . LAPAROSCOPIC TUBAL LIGATION  02/28/2012   Procedure: LAPAROSCOPIC TUBAL LIGATION;  Surgeon: Cyril Mourning, MD;  Location: [facility];  Service: Gynecology;  Laterality: Bilateral;  . TONSILLECTOMY      There were no vitals filed for this visit.  Subjective Assessment - 08/09/17 1106    Subjective  Pt is really sore, worked over the weekend and is still very sore. 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  Yes    Pain Score  6     Pain Location  Back    Pain Orientation  Right;Lower    Pain Descriptors / Indicators  Sharp    Pain Type  Chronic pain    Pain Onset  More than a month ago    Pain Frequency  Constant    Aggravating Factors   sitting and work, bending over, prolonged sedentary     Pain Relieving Factors  heat when on, some stretches                       [facility] Adult PT Treatment/Exercise - 08/09/17  0001      Lumbar Exercises: Stretches   Double Knee to Chest Stretch  2 reps;30 seconds    Other Lumbar Stretch Exercise  hooklying over yoga blocks for back and chest stretch.       Lumbar Exercises: Aerobic   Nustep  L3 x4' arms and legs       Shoulder Exercises: Stretch   Other Shoulder Stretches  cervical retraction and side bends      Modalities   Modalities  Electrical Stimulation;Moist Heat      Moist Heat Therapy   Number Minutes Moist Heat  15 Minutes    Moist Heat Location  Lumbar Spine Lt buttocks      Electrical Stimulation   Electrical Stimulation Location  Lumbar and lt buttocks    Electrical Stimulation Action  IFC    Electrical Stimulation Parameters  to tolerance    Electrical Stimulation Goals  Pain;Tone      Manual Therapy   Manual Therapy  Soft tissue mobilization    Soft tissue mobilization  STM to  bilat lumbar paraspinals and Lt gluts.  less banding after DN       Trigger Point Dry Needling - 08/09/17 1147    Consent Given?  Yes    Education Handout Provided  No had from previous tx    Muscles Treated Upper Body  Longissimus    Muscles Treated Lower Body  Gluteus maximus;Gluteus minimus;Piriformis    Longissimus Response  Palpable increased muscle length;Twitch response elicited Rt H4-R74, stim on rt , Lt L4-3    Gluteus Maximus Response  Palpable increased muscle length;Twitch response elicited Lt    Gluteus Minimus Response  Palpable increased muscle length;Twitch response elicited Lt    Piriformis Response  Palpable increased muscle length;Twitch response elicited LT with stm           PT Education - 08/09/17 1145    Education provided  Yes    Education Details  HEP review DN    Person(s) Educated  Patient    Methods  Explanation;Demonstration;Handout    Comprehension  Verbalized understanding;Returned demonstration          PT Long Term Goals - 08/09/17 1250      PT LONG TERM GOAL #1   Title  I with advanced HEP ( 09/14/17)     Status  On-going      PT LONG TERM GOAL #2   Title  demo painfree Lumbar ROM WFL ( 09/14/17)      Status  On-going      PT LONG TERM GOAL #3   Title  improve Lt shoulder strength =/> 5-/5 to allow her to perform her work without difficulty ( 09/14/17)    Status  On-going      PT LONG TERM GOAL #4   Title  improve Lt hip strength to [facility] to allow her walk at work with out difficulty  ( 09/14/17)     Status  On-going      PT LONG TERM GOAL #5   Title  report =/> 75% reduction of back pain at the end of a work shift ( 09/14/17)     Status  On-going      PT LONG TERM GOAL #6   Title  improve FOTO =/< 405 limited ( 09/14/17)     Status  On-going            Plan - 08/09/17 1247    Clinical Impression Statement  This is Tracey Patterson's second visit. she is still having a lot of pain in her low back and some in her Lt shoulder.  Responded well to manual work today with decreased palpable tightness. No goals met yet.     Rehab Potential  Good    PT Frequency  2x / week    PT Duration  6 weeks    PT Treatment/Interventions  Iontophoresis 43m/ml Dexamethasone;Dry needling;Manual techniques;Moist Heat;Traction;Ultrasound;Therapeutic activities;Patient/family education;Taping;Vasopneumatic Device;Therapeutic exercise;Cryotherapy;Passive range of motion    PT Next Visit Plan  assess response to DN, continue with manual work, core strength and progress RTC strengthening    Consulted and Agree with Plan of Care  Patient       Patient will benefit from skilled therapeutic intervention in order to improve the following deficits and impairments:  Pain, Improper body mechanics, Postural dysfunction, Increased muscle spasms, Decreased strength, Decreased range of motion, Impaired flexibility  Visit Diagnosis: Chronic left shoulder pain  Muscle weakness (generalized)  Other muscle spasm  Chronic right-sided low back pain with right-sided sciatica     Problem List Patient  Active Problem List    Diagnosis Date Noted  . Colitis 06/17/2017  . Left ovarian cyst 06/17/2017  . Left shoulder pain 09/07/2016  . Degenerative tear of acetabular labrum of left hip 07/15/2016  . Ganglion cyst 02/17/2016  . Generalized anxiety disorder 02/17/2016  . Recurrent sinusitis 02/17/2016  . Vitamin D deficiency 02/15/2014  . Asthma, chronic 12/14/2012  . GERD (gastroesophageal reflux disease) 12/14/2012  . Seasonal allergies 12/14/2012  . Anxiety and depression 12/14/2012  . Transaminitis 12/14/2012  . Myalgia and myositis 10/30/2012  . Other malaise and fatigue 10/26/2012  . Flushing 10/26/2012  . Insomnia 08/13/2012  . Stress and adjustment reaction 08/13/2012    Jeral Pinch PT  08/09/2017, 12:52 PM  [facility] [place], [place], [address] Phone: [phone]   Fax:  [phone]  Name: Tracey Patterson MRN: [id number] Date of Birth: 1981/01/14

## 2017-08-11 ENCOUNTER — Ambulatory Visit: Payer: No Typology Code available for payment source | Admitting: Physical Therapy

## 2017-08-11 DIAGNOSIS — M25512 Pain in left shoulder: Secondary | ICD-10-CM | POA: Diagnosis not present

## 2017-08-11 DIAGNOSIS — M62838 Other muscle spasm: Secondary | ICD-10-CM | POA: Diagnosis not present

## 2017-08-11 DIAGNOSIS — M5441 Lumbago with sciatica, right side: Secondary | ICD-10-CM

## 2017-08-11 DIAGNOSIS — M6281 Muscle weakness (generalized): Secondary | ICD-10-CM

## 2017-08-11 DIAGNOSIS — G8929 Other chronic pain: Secondary | ICD-10-CM | POA: Diagnosis not present

## 2017-08-11 NOTE — Therapy (Signed)
[place], [place], [address] Phone: [phone]   Fax:  [phone]  Physical Therapy Treatment  Patient Details  Name: Tracey Patterson MRN: [id number] Date of Birth: [birth year] Referring Provider: Dr. [name]    Encounter Date: 08/11/2017  PT End of Session - 08/11/17 1109    Visit Number  3    Number of Visits  12    Date for PT Re-Evaluation  09/14/17    PT Start Time  1108 pt arrived late    PT Stop Time  1203    PT Time Calculation (min)  55 min       Past Medical History:  Diagnosis Date  . Anxiety   . Asthma   . History of TMJ disorder     Past Surgical History:  Procedure Laterality Date  . BACK SURGERY  December 2015  . KNEE ARTHROSCOPY  1998   left  . LAPAROSCOPIC TUBAL LIGATION  02/28/2012   Procedure: LAPAROSCOPIC TUBAL LIGATION;  Surgeon: Cyril Mourning, MD;  Location: [facility];  Service: Gynecology;  Laterality: Bilateral;  . TONSILLECTOMY      There were no vitals filed for this visit.  Subjective Assessment - 08/11/17 1113    Subjective  Pt reports she was in a lot of pain the day of last session.  No real difference since last visit.  She has difficulty with some of the stretches.  She continues to feel very stiff in her back.     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  Yes    Pain Score  5     Pain Location  Back    Pain Orientation  Right;Lower    Pain Descriptors / Indicators  Aching;Tightness    Aggravating Factors   sitting and work    Pain Relieving Factors  heat, stretches         [facility] PT Assessment - 08/11/17 0001      Assessment   Medical Diagnosis  Lumbosacral sprain, Lt shoulder pain    Referring Provider  Dr. [name]     Onset Date/Surgical Date  07/06/17    Hand Dominance  Right      Palpation   SI assessment   Lt PSIS lower than Rt.  Rt  ASIS higher than Lt. iliac crest level in standing     Palpation comment  tender Lt PSIS, Lt ASIS       [facility] Adult PT Treatment/Exercise - 08/11/17 0001      Lumbar Exercises: Stretches   Press Ups  2 reps;10 seconds    Piriformis Stretch  Right;Left;2 reps;30 seconds    Other Lumbar Stretch Exercise  10 reps cat/cow, 1 rep each, 30 sec childs pose,then stretch to each side.   childs pose with wide legs x 3 reps      Lumbar Exercises: Aerobic   Tread Mill  2.5 mph x 5.5 min  PTA present to discuss progress      Lumbar Exercises: Supine   Bridge  5 reps core engaged.       Lumbar Exercises: Prone   Other Prone Lumbar Exercises  8 reps x 5sec pelvic press with tactile cues for proper engagement; upper body lifts with arms in goal post position x 10 reps      Shoulder Exercises: Supine  External Rotation  Strengthening;Both;20 reps;Theraband    Theraband Level (Shoulder External Rotation)  Level 2 (Red)      Electrical Stimulation   Electrical Stimulation Location  Rt lumbar; Lt SI/hip     Electrical Stimulation Action  TENS    Electrical Stimulation Parameters  to tolerance     Electrical Stimulation Goals  Tone;Pain      Manual Therapy   Manual Therapy  Muscle Energy Technique    Muscle Energy Technique  MET to correct Lt posteriorly rotated ilium in prone with RLE off table (foot on floor) utilizing contract/relax of Lt hip flexor. 2 sets.          PT Long Term Goals - 08/09/17 1250      PT LONG TERM GOAL #1   Title  I with advanced HEP ( 09/14/17)    Status  On-going      PT LONG TERM GOAL #2   Title  demo painfree Lumbar ROM WFL ( 09/14/17)      Status  On-going      PT LONG TERM GOAL #3   Title  improve Lt shoulder strength =/> 5-/5 to allow her to perform her work without difficulty ( 09/14/17)    Status  On-going      PT LONG TERM GOAL #4   Title  improve Lt hip strength to [facility] to allow her walk at work with out difficulty  ( 09/14/17)     Status  On-going       PT LONG TERM GOAL #5   Title  report =/> 75% reduction of back pain at the end of a work shift ( 09/14/17)     Status  On-going      PT LONG TERM GOAL #6   Title  improve FOTO =/< 405 limited ( 09/14/17)     Status  On-going            Plan - 08/11/17 1127    Clinical Impression Statement  Pt's Lt mulitifidi slow to fire, with weaker contraction than Rt. Pt point tender in Rt lumbar paraspinals and Lt SI joint.  Pt's Lt ilium appeared posteriorly rotated; improved alignment after MET correction.  She tolerated all exercises well and reported slight reduction in pain at end of session after estim/MHP.      Rehab Potential  Good    PT Frequency  2x / week    PT Duration  6 weeks    PT Treatment/Interventions  Iontophoresis 17m/ml Dexamethasone;Dry needling;Manual techniques;Moist Heat;Traction;Ultrasound;Therapeutic activities;Patient/family education;Taping;Vasopneumatic Device;Therapeutic exercise;Cryotherapy;Passive range of motion    PT Next Visit Plan  assess pelvic alignment; progress RTC strengthening; continue back education and core strengthening.        Patient will benefit from skilled therapeutic intervention in order to improve the following deficits and impairments:  Pain, Improper body mechanics, Postural dysfunction, Increased muscle spasms, Decreased strength, Decreased range of motion, Impaired flexibility  Visit Diagnosis: Chronic right-sided low back pain with right-sided sciatica  Muscle weakness (generalized)  Other muscle spasm  Chronic left shoulder pain     Problem List Patient Active Problem List   Diagnosis Date Noted  . Colitis 06/17/2017  . Left ovarian cyst 06/17/2017  . Left shoulder pain 09/07/2016  . Degenerative tear of acetabular labrum of left hip 07/15/2016  . Ganglion cyst 02/17/2016  . Generalized anxiety disorder 02/17/2016  . Recurrent sinusitis 02/17/2016  . Vitamin D deficiency 02/15/2014  . Asthma, chronic 12/14/2012  .  GERD (gastroesophageal reflux disease)  12/14/2012  . Seasonal allergies 12/14/2012  . Anxiety and depression 12/14/2012  . Transaminitis 12/14/2012  . Myalgia and myositis 10/30/2012  . Other malaise and fatigue 10/26/2012  . Flushing 10/26/2012  . Insomnia 08/13/2012  . Stress and adjustment reaction 08/13/2012   Kerin Perna, PTA 08/11/17 12:38 PM  [place], [place], [address] Phone: [phone]   Fax:  [phone]  Name: Tracey Patterson MRN: [id number] Date of Birth: 1980-04-25

## 2017-08-16 ENCOUNTER — Ambulatory Visit: Payer: No Typology Code available for payment source | Admitting: Physical Therapy

## 2017-08-16 ENCOUNTER — Encounter: Payer: Self-pay | Admitting: Physical Therapy

## 2017-08-16 DIAGNOSIS — M62838 Other muscle spasm: Secondary | ICD-10-CM

## 2017-08-16 DIAGNOSIS — M5441 Lumbago with sciatica, right side: Secondary | ICD-10-CM

## 2017-08-16 DIAGNOSIS — G8929 Other chronic pain: Secondary | ICD-10-CM | POA: Diagnosis not present

## 2017-08-16 DIAGNOSIS — M25512 Pain in left shoulder: Secondary | ICD-10-CM | POA: Diagnosis not present

## 2017-08-16 DIAGNOSIS — M6281 Muscle weakness (generalized): Secondary | ICD-10-CM

## 2017-08-16 NOTE — Therapy (Signed)
[place], [place], [address] Phone: [phone]   Fax:  [phone]  Physical Therapy Treatment  Patient Details  Name: Tracey Patterson MRN: [id number] Date of Birth: 10-28-1980 Referring Provider: Dr. [name]    Encounter Date: 08/16/2017  PT End of Session - 08/16/17 1105    Visit Number  4    Number of Visits  12    Date for PT Re-Evaluation  09/14/17    PT Start Time  1105    PT Stop Time  1159    PT Time Calculation (min)  54 min    Activity Tolerance  Patient limited by pain       Past Medical History:  Diagnosis Date  . Anxiety   . Asthma   . History of TMJ disorder     Past Surgical History:  Procedure Laterality Date  . BACK SURGERY  December 2015  . KNEE ARTHROSCOPY  1998   left  . LAPAROSCOPIC TUBAL LIGATION  02/28/2012   Procedure: LAPAROSCOPIC TUBAL LIGATION;  Surgeon: Cyril Mourning, MD;  Location: [facility];  Service: Gynecology;  Laterality: Bilateral;  . TONSILLECTOMY      There were no vitals filed for this visit.  Subjective Assessment - 08/16/17 1107    Subjective  [name] reports her back pain is really bad, she had to call out of work because of it.  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  Yes    Pain Score  8     Pain Location  Back    Pain Orientation  Right;Lower    Pain Descriptors / Indicators  Sharp    Pain Type  Chronic pain    Pain Radiating Towards  from back to front of hip into quad mid    Pain Onset  More than a month ago    Pain Frequency  Constant    Aggravating Factors   sitting and working, walking    Pain Relieving Factors  lying down     Multiple Pain Sites  -- back and  leg  so painful that she can't feel her shoulder         [facility] PT Assessment - 08/16/17 0001      Assessment   Medical Diagnosis  Lumbosacral sprain, Lt shoulder pain      Posture/Postural Control   Posture Comments  pt in good pelvic alignment lying down.                    [facility] Adult PT Treatment/Exercise - 08/16/17 0001      Lumbar Exercises: Stretches   Quadruped Mid Back Stretch  -- 10 cat/cow flo      Lumbar Exercises: Aerobic   Nustep  L4 x5' arms and legs       Lumbar Exercises: Supine   Ab Set  10 reps with pelvic tilts.       Modalities   Modalities  Electrical Stimulation;Moist Heat      Moist Heat Therapy   Number Minutes Moist Heat  20 Minutes    Moist Heat Location  Lumbar Spine  Marketing executive Parameters  to tolerance    Electrical Stimulation Goals  Pain      Manual Therapy   Manual Therapy  Joint mobilization;Soft tissue mobilization    Joint Mobilization  grade III CPA and Rt UPA mobs, sacrum to L2, hypomobile and pain at L5-4    Soft tissue mobilization  STM to Rt gluts/piriformis with active release, STM into bilat lumbar paraspinals and QLs.                   PT Long Term Goals - 08/16/17 1132      PT LONG TERM GOAL #1   Title  I with advanced HEP ( 09/14/17)    Status  On-going      PT LONG TERM GOAL #2   Title  demo painfree Lumbar ROM WFL ( 09/14/17)      Status  On-going      PT LONG TERM GOAL #3   Title  improve Lt shoulder strength =/> 5-/5 to allow her to perform her work without difficulty ( 09/14/17)    Status  On-going      PT LONG TERM GOAL #4   Title  improve Lt hip strength to [facility] to allow her walk at work with out difficulty  ( 09/14/17)     Status  On-going      PT LONG TERM GOAL #5   Title  report =/> 75% reduction of back pain at the end of a work shift ( 09/14/17)      Status  On-going      PT LONG TERM GOAL #6   Title  improve FOTO =/< 40% limited ( 09/14/17)     Status  On-going            Plan - 08/16/17 1139    Clinical Impression Statement  Shaunda continues with a lot of low back and Rt quad pain.  It is much worse after working.  So far the pain levels have limited her tolerance to strengthening exercise which is what she will need to stabilize her back.  Backing off work is not an option for her right now and they do not have light duty available. She still has tightness in her lumbar muscles, the Rt gluts and piriformis aren't as tight however some trigger points are still there.   Her pelvis was in alignment in supine and prone today.     Rehab Potential  Good    PT Frequency  2x / week    PT Duration  6 weeks    PT Treatment/Interventions  Iontophoresis 45m/ml Dexamethasone;Dry needling;Manual techniques;Moist Heat;Traction;Ultrasound;Therapeutic activities;Patient/family education;Taping;Vasopneumatic Device;Therapeutic exercise;Cryotherapy;Passive range of motion    PT Next Visit Plan  assess pelvic alignment; review body mechanics as relates to pushing/pulling portable x-ray machine and moving patients in bed. Based on pain level manual vx strengthening      Consulted and Agree with Plan of Care  Patient       Patient will benefit from skilled therapeutic intervention in order to improve the following deficits and impairments:  Pain, Improper body mechanics, Postural dysfunction, Increased muscle spasms, Decreased strength, Decreased range of motion, Impaired flexibility  Visit Diagnosis: Chronic right-sided low back pain with right-sided sciatica  Muscle weakness (generalized)  Other muscle spasm  Chronic left shoulder pain     Problem List Patient Active Problem List  Diagnosis Date Noted  . Colitis 06/17/2017  . Left ovarian cyst 06/17/2017  . Left shoulder pain 09/07/2016  . Degenerative tear of acetabular labrum of  left hip 07/15/2016  . Ganglion cyst 02/17/2016  . Generalized anxiety disorder 02/17/2016  . Recurrent sinusitis 02/17/2016  . Vitamin D deficiency 02/15/2014  . Asthma, chronic 12/14/2012  . GERD (gastroesophageal reflux disease) 12/14/2012  . Seasonal allergies 12/14/2012  . Anxiety and depression 12/14/2012  . Transaminitis 12/14/2012  . Myalgia and myositis 10/30/2012  . Other malaise and fatigue 10/26/2012  . Flushing 10/26/2012  . Insomnia 08/13/2012  . Stress and adjustment reaction 08/13/2012    Jeral Pinch PT  08/16/2017, 11:45 AM  [facility] [place], [place], [address] Phone: [phone]   Fax:  [phone]  Name: Damian Hofstra MRN: [id number] Date of Birth: 08-11-1980

## 2017-08-18 ENCOUNTER — Encounter: Payer: Self-pay | Admitting: Physical Therapy

## 2017-08-18 ENCOUNTER — Ambulatory Visit: Payer: No Typology Code available for payment source | Admitting: Physical Therapy

## 2017-08-18 DIAGNOSIS — M6281 Muscle weakness (generalized): Secondary | ICD-10-CM | POA: Diagnosis not present

## 2017-08-18 DIAGNOSIS — M62838 Other muscle spasm: Secondary | ICD-10-CM | POA: Diagnosis not present

## 2017-08-18 DIAGNOSIS — G8929 Other chronic pain: Secondary | ICD-10-CM | POA: Diagnosis not present

## 2017-08-18 DIAGNOSIS — M5441 Lumbago with sciatica, right side: Secondary | ICD-10-CM

## 2017-08-18 NOTE — Therapy (Signed)
[place], [place], [address] Phone: [phone]   Fax:  [phone]  Physical Therapy Treatment  Patient Details  Name: Tracey Patterson MRN: [id number] Date of Birth: 1980/04/12 Referring Provider: Dr. Lynne Patterson    Encounter Date: 08/18/2017  PT End of Session - 08/18/17 1107    Visit Number  5    Number of Visits  12    Date for PT Re-Evaluation  09/14/17    PT Start Time  1108    PT Stop Time  1157    PT Time Calculation (min)  49 min    Activity Tolerance  Patient limited by pain       Past Medical History:  Diagnosis Date  . Anxiety   . Asthma   . History of TMJ disorder     Past Surgical History:  Procedure Laterality Date  . BACK SURGERY  December 2015  . KNEE ARTHROSCOPY  1998   left  . LAPAROSCOPIC TUBAL LIGATION  02/28/2012   Procedure: LAPAROSCOPIC TUBAL LIGATION;  Surgeon: Tracey Mourning, MD;  Location: [facility];  Service: Gynecology;  Laterality: Bilateral;  . TONSILLECTOMY      There were no vitals filed for this visit.  Subjective Assessment - 08/18/17 1108    Subjective  Tracey Patterson reports a lot of pain     Currently in Pain?  Yes    Pain Score  7     Pain Location  Back         [facility] PT Assessment - 08/18/17 0001      Posture/Postural Control   Posture Comments  Pt ambulates into the clinic with a Rt upper body shift and LE turned out due to pain.                    [facility] Adult PT Treatment/Exercise - 08/18/17 0001      Self-Care   Self-Care  -- reviewed body mechanics as relates to her work.       Lumbar Exercises: Aerobic   Nustep  L4 x5' arms and legs       Modalities   Modalities  Electrical Stimulation;Moist Heat      Moist Heat Therapy   Number Minutes Moist Heat  20 Minutes    Moist Heat Location  Lumbar Spine      Electrical Stimulation   Electrical Stimulation Location  lumbar/sacrum    Electrical Stimulation Action  IFC    Electrical Stimulation Parameters  to tolerance    Electrical Stimulation Goals  Pain      Manual Therapy   Soft tissue mobilization  STM bilat lumbar paraspinals, very tight with alot of guarding       Trigger Point Dry Needling - 08/18/17 1119    Consent Given?  Yes    Education Handout Provided  No    Muscles Treated Upper Body  Longissimus moved needles up to T10-L3 after 7'    Longissimus Response  Palpable increased muscle length;Twitch response elicited bilat lumbar with stim - pt very sensitive around L5                PT Long Term Goals - 08/16/17 1132      PT LONG TERM GOAL #1   Title  I with advanced HEP ( 09/14/17)    Status  On-going      PT LONG TERM GOAL #2   Title  demo painfree Lumbar ROM WFL ( 09/14/17)  Status  On-going      PT LONG TERM GOAL #3   Title  improve Lt shoulder strength =/> 5-/5 to allow her to perform her work without difficulty ( 09/14/17)    Status  On-going      PT LONG TERM GOAL #4   Title  improve Lt hip strength to [facility] to allow her walk at work with out difficulty  ( 09/14/17)     Status  On-going      PT LONG TERM GOAL #5   Title  report =/> 75% reduction of back pain at the end of a work shift ( 09/14/17)     Status  On-going      PT LONG TERM GOAL #6   Title  improve FOTO =/< 40% limited ( 09/14/17)     Status  On-going            Plan - 08/18/17 1120    Clinical Impression Statement  Ha presented today with an upper body shift to the Rt , this was absent with lying down and sitting.  Her back musculature is very tight.  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     Rehab Potential  Good    PT Frequency  2x / week    PT Duration  6 weeks    PT Treatment/Interventions  Iontophoresis 16m/ml Dexamethasone;Dry needling;Manual techniques;Moist Heat;Traction;Ultrasound;Therapeutic activities;Patient/family education;Taping;Vasopneumatic Device;Therapeutic  exercise;Cryotherapy;Passive range of motion    PT Next Visit Plan  woring decreasing muscle tightness/trigger points so she can maintain good alignment.     Consulted and Agree with Plan of Care  Patient       Patient will benefit from skilled therapeutic intervention in order to improve the following deficits and impairments:  Pain, Improper body mechanics, Postural dysfunction, Increased muscle spasms, Decreased strength, Decreased range of motion, Impaired flexibility  Visit Diagnosis: Chronic right-sided low back pain with right-sided sciatica  Muscle weakness (generalized)  Other muscle spasm     Problem List Patient Active Problem List   Diagnosis Date Noted  . Colitis 06/17/2017  . Left ovarian cyst 06/17/2017  . Left shoulder pain 09/07/2016  . Degenerative tear of acetabular labrum of left hip 07/15/2016  . Ganglion cyst 02/17/2016  . Generalized anxiety disorder 02/17/2016  . Recurrent sinusitis 02/17/2016  . Vitamin D deficiency 02/15/2014  . Asthma, chronic 12/14/2012  . GERD (gastroesophageal reflux disease) 12/14/2012  . Seasonal allergies 12/14/2012  . Anxiety and depression 12/14/2012  . Transaminitis 12/14/2012  . Myalgia and myositis 10/30/2012  . Other malaise and fatigue 10/26/2012  . Flushing 10/26/2012  . Insomnia 08/13/2012  . Stress and adjustment reaction 08/13/2012    SJeral PinchPT  08/18/2017, 11:44 AM  [facility] [address]Phone: [phone]  Fax:  [phone] Name: Tracey BohmanMRN: [id number] of Birth: [birth year]

## 2017-08-23 ENCOUNTER — Ambulatory Visit: Payer: No Typology Code available for payment source | Admitting: Physical Therapy

## 2017-08-23 DIAGNOSIS — M6281 Muscle weakness (generalized): Secondary | ICD-10-CM

## 2017-08-23 DIAGNOSIS — M62838 Other muscle spasm: Secondary | ICD-10-CM | POA: Diagnosis not present

## 2017-08-23 DIAGNOSIS — G8929 Other chronic pain: Secondary | ICD-10-CM | POA: Diagnosis not present

## 2017-08-23 DIAGNOSIS — M5441 Lumbago with sciatica, right side: Secondary | ICD-10-CM

## 2017-08-23 NOTE — Therapy (Signed)
[place], [place], [address] Phone: [phone]   Fax:  [phone]  Physical Therapy Treatment  Patient Details  Name: Tracey Patterson MRN: [id number] Date of Birth: [birth year] Referring Provider: Dr. [name]   Encounter Date: 08/23/2017  PT End of Session - 08/23/17 1106    Visit Number  6    Number of Visits  12    Date for PT Re-Evaluation  09/14/17    PT Start Time  1106    PT Stop Time  1159    PT Time Calculation (min)  53 min    Activity Tolerance  Patient tolerated treatment well    Behavior During Therapy  [facility] for tasks assessed/performed       Past Medical History:  Diagnosis Date  . Anxiety   . Asthma   . History of TMJ disorder     Past Surgical History:  Procedure Laterality Date  . BACK SURGERY  December 2015  . KNEE ARTHROSCOPY  1998   left  . LAPAROSCOPIC TUBAL LIGATION  02/28/2012   Procedure: LAPAROSCOPIC TUBAL LIGATION;  Surgeon: Cyril Mourning, MD;  Location: [facility];  Service: Gynecology;  Laterality: Bilateral;  . TONSILLECTOMY      There were no vitals filed for this visit.  Subjective Assessment - 08/23/17 1109    Subjective  Pt reports the evening of last treatment was "awful".   However, things have improved since then.  She was able to sit through dinner on Sunday with less pain.  She is using tiger balm patches and taking voltaren daily.   She complains of tightness in low back.     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  Yes    Pain Score  5     Pain Location  Back    Pain Orientation  Right;Left    Pain Descriptors / Indicators  Sharp    Pain Radiating Towards  down into Lt buttock.     Aggravating Factors   transitional movements and prolonged sitting    Pain Relieving Factors  lying down,.          [facility] PT Assessment - 08/23/17 0001      Assessment   Medical Diagnosis  Lumbosacral sprain, Lt shoulder pain    Referring Provider  Dr. [name]    Onset Date/Surgical Date  07/06/17    Hand Dominance  Right    Next MD Visit  08/31/17      Strength   Strength Assessment Site  Shoulder    Right/Left Shoulder  Left    Left Shoulder Flexion  5/5    Left Shoulder Extension  5/5    Left Shoulder ABduction  -- 5-/5    Left Shoulder Internal Rotation  5/5    Left Shoulder External Rotation  -- 5-/5    Left Hip Flexion  4+/5    Left Hip ABduction  -- 5-/5       [facility] Adult PT Treatment/Exercise - 08/23/17 0001      Lumbar Exercises: Stretches   Passive Hamstring Stretch  Right;Left;2 reps;30 seconds    Piriformis Stretch  Right;Left;2 reps;30 seconds seated    Other Lumbar Stretch Exercise  childs pose x 30 sec, POE x 20 sec, childs pose x 30 sec.   One more rep of childs pose after  opp arm/leg x 30 sec.       Lumbar Exercises: Aerobic   Tread Mill  2.2 mph x 4 min  PTA present to discuss progress      Lumbar Exercises: Seated   Sit to Stand  10 reps 3 part core engaged.       Lumbar Exercises: Prone   Opposite Arm/Leg Raise  Right arm/Left leg;Left arm/Right leg;5 reps;2 seconds      Shoulder Exercises: Stretch   Other Shoulder Stretches  bilat lat stretch (modified childs pose) with hands on back of truck x 15 sec.       Modalities   Modalities  Electrical Stimulation;Cryotherapy      Cryotherapy   Number Minutes Cryotherapy  8 Minutes    Cryotherapy Location  Lumbar Spine    Type of Cryotherapy  Ice pack - changed to MHP for last 7 min due to low tolerance for ice     Electrical Stimulation   Electrical Stimulation Location  bilat lumbar/sacrum    Electrical Stimulation Action  IFC    Electrical Stimulation Parameters  to tolerance     Electrical Stimulation Goals  Pain                  PT Long Term Goals - 08/23/17 1125      PT LONG TERM GOAL #1   Title  I with advanced HEP ( 09/14/17)     Time  6    Status  On-going      PT LONG TERM GOAL #2   Title  demo painfree Lumbar ROM WFL ( 09/14/17)      Time  6    Period  Weeks    Status  On-going      PT LONG TERM GOAL #3   Title  improve Lt shoulder strength =/> 5-/5 to allow her to perform her work without difficulty ( 09/14/17)    Time  6    Period  Weeks    Status  Achieved      PT LONG TERM GOAL #4   Title  improve Lt hip strength to [facility] to allow her walk at work with out difficulty  ( 09/14/17)     Time  6    Period  Weeks    Status  On-going improved      PT LONG TERM GOAL #5   Title  report =/> 75% reduction of back pain at the end of a work shift ( 09/14/17)     Time  6    Period  Weeks    Status  On-going unchanged; 08/23/17      PT LONG TERM GOAL #6   Title  improve FOTO =/< 40% limited ( 09/14/17)     Time  6    Period  Weeks    Status  On-going            Plan - 08/23/17 1133    Clinical Impression Statement  Improved Lt shoulder strength without pain.  She reported quick fatigue with core muscles during exercises today.  she tolerated all exercises without increase in pain. Pt has met LTG#3 and is making gradual progress towards remaining goals.  Pt reported 3 point reduction of pain with exercise and further reduction with estim/ice pack.     Rehab Potential  Good    PT Frequency  2x / week    PT Duration  6 weeks    PT Treatment/Interventions  Iontophoresis 64m/ml Dexamethasone;Dry needling;Manual  techniques;Moist Heat;Traction;Ultrasound;Therapeutic activities;Patient/family education;Taping;Vasopneumatic Device;Therapeutic exercise;Cryotherapy;Passive range of motion    PT Next Visit Plan  trial of combo to LB trigger points. continue core strengthening.     Consulted and Agree with Plan of Care  Patient       Patient will benefit from skilled therapeutic intervention in order to improve the following deficits and impairments:  Pain, Improper body mechanics, Postural dysfunction, Increased  muscle spasms, Decreased strength, Decreased range of motion, Impaired flexibility  Visit Diagnosis: Chronic right-sided low back pain with right-sided sciatica  Muscle weakness (generalized)  Other muscle spasm     Problem List Patient Active Problem List   Diagnosis Date Noted  . Colitis 06/17/2017  . Left ovarian cyst 06/17/2017  . Left shoulder pain 09/07/2016  . Degenerative tear of acetabular labrum of left hip 07/15/2016  . Ganglion cyst 02/17/2016  . Generalized anxiety disorder 02/17/2016  . Recurrent sinusitis 02/17/2016  . Vitamin D deficiency 02/15/2014  . Asthma, chronic 12/14/2012  . GERD (gastroesophageal reflux disease) 12/14/2012  . Seasonal allergies 12/14/2012  . Anxiety and depression 12/14/2012  . Transaminitis 12/14/2012  . Myalgia and myositis 10/30/2012  . Other malaise and fatigue 10/26/2012  . Flushing 10/26/2012  . Insomnia 08/13/2012  . Stress and adjustment reaction 08/13/2012   Kerin Perna, PTA 08/23/17 11:47 AM  [facility] [place], [place], [address] Phone: [phone]   Fax:  [phone]  Name: Tracey Patterson MRN: [id number] Date of Birth: 08/23/1980

## 2017-08-25 ENCOUNTER — Encounter: Payer: Self-pay | Admitting: Physical Therapy

## 2017-08-30 ENCOUNTER — Ambulatory Visit: Payer: No Typology Code available for payment source | Admitting: Physical Therapy

## 2017-08-30 DIAGNOSIS — M6281 Muscle weakness (generalized): Secondary | ICD-10-CM

## 2017-08-30 DIAGNOSIS — M5441 Lumbago with sciatica, right side: Secondary | ICD-10-CM

## 2017-08-30 DIAGNOSIS — M62838 Other muscle spasm: Secondary | ICD-10-CM | POA: Diagnosis not present

## 2017-08-30 DIAGNOSIS — G8929 Other chronic pain: Secondary | ICD-10-CM

## 2017-08-30 NOTE — Therapy (Signed)
[place], [place], [address] Phone: [phone]   Fax:  [phone]  Physical Therapy Treatment  Patient Details  Name: Tracey Patterson MRN: [id number] Date of Birth: 1981-03-18 Referring Provider: Dr. [name]   Encounter Date: 08/30/2017  PT End of Session - 08/30/17 0939    Visit Number  7    Number of Visits  12    Date for PT Re-Evaluation  09/14/17    PT Start Time  0935    PT Stop Time  1019    PT Time Calculation (min)  44 min    Activity Tolerance  Patient tolerated treatment well    Behavior During Therapy  [facility] for tasks assessed/performed       Past Medical History:  Diagnosis Date  . Anxiety   . Asthma   . History of TMJ disorder     Past Surgical History:  Procedure Laterality Date  . BACK SURGERY  December 2015  . KNEE ARTHROSCOPY  1998   left  . LAPAROSCOPIC TUBAL LIGATION  02/28/2012   Procedure: LAPAROSCOPIC TUBAL LIGATION;  Surgeon: Cyril Mourning, MD;  Location: [facility];  Service: Gynecology;  Laterality: Bilateral;  . TONSILLECTOMY      There were no vitals filed for this visit.  Subjective Assessment - 08/30/17 0935    Subjective  Pt reports this past weekend, work load was less.  She reports some stiffness "all over", but pain level is low. She reports she did have another occasion that she could sit through dinner with less pain.  She reports some pain in Rt low back when rolling over to Rt in bed.     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  No/denies    Pain Score  0-No pain up to 1/10 with movement.       [facility] PT Assessment - 08/30/17 0001      Assessment   Medical Diagnosis  Lumbosacral sprain, Lt shoulder pain    Referring Provider  Dr. [name]    Onset Date/Surgical Date  07/06/17    Hand Dominance  Right    Next MD Visit  08/31/17      AROM   Lumbar Flexion  finger tips to top of shin with "pinch" in Rt low back.     Lumbar - Right Side Bend  WNL    Lumbar - Left Side Bend  WNL with pain at end range.     Lumbar - Right Rotation  75%     Lumbar - Left Rotation  75%, with pain at end range      Strength   Left Hip Flexion  4+/5        [facility] Adult PT Treatment/Exercise - 08/30/17 0001      Lumbar Exercises: Stretches   Passive Hamstring Stretch  Right;Left;2 reps;30 seconds    Hip Flexor Stretch  Left;1 rep;30 seconds standing, after resisted hip flexion    Press Ups  2 reps;10 seconds    Piriformis Stretch  Right;Left;3 reps;20 seconds 1 rep seated, 2 reps supine    Other Lumbar Stretch Exercise  childs pose x 2 reps      Lumbar Exercises: Aerobic   Nustep  L4 x 6.5 arms and legs  PTA present to discuss progress      Lumbar Exercises: Seated  Sit to Stand  5 reps    Other Seated Lumbar Exercises  seated Lt hip flexion with core engaged x 5 reps      Lumbar Exercises: Supine   Ab Set  5 reps;5 seconds    Isometric Hip Flexion  5 reps;5 seconds      Lumbar Exercises: Prone   Opposite Arm/Leg Raise  Right arm/Left leg;Left arm/Right leg;5 reps;2 seconds 2 sets      Modalities   Modalities  Electrical Stimulation;Ultrasound      Electrical Stimulation   Electrical Stimulation Location  see [place].     Electrical Stimulation Action  combo [place]    Electrical Stimulation Parameters  to pt tolerance    Electrical Stimulation Goals  Pain      Ultrasound   Ultrasound Location  Rt lower lumbar, SI joint    Ultrasound Parameters  100%, 1.2 w/cm2, 8 min     Ultrasound Goals  Pain             PT Education - 08/30/17 1312    Education provided  Yes    Education Details  HEP    Person(s) Educated  Patient    Methods  Explanation;Demonstration;Handout;Verbal cues    Comprehension  Verbalized understanding;Returned demonstration          PT Long Term Goals - 08/30/17 1310      PT LONG TERM GOAL #1   Title  I  with advanced HEP ( 09/14/17)    Time  6    Period  Weeks    Status  On-going      PT LONG TERM GOAL #2   Title  demo painfree Lumbar ROM WFL ( 09/14/17)      Time  6    Period  Weeks    Status  On-going improved; pain with flexion and sidebending      PT LONG TERM GOAL #3   Title  improve Lt shoulder strength =/> 5-/5 to allow her to perform her work without difficulty ( 09/14/17)    Time  6    Period  Weeks    Status  Achieved      PT LONG TERM GOAL #4   Title  improve Lt hip strength to [facility] to allow her walk at work with out difficulty  ( 09/14/17)     Time  6    Period  Weeks    Status  On-going improved.       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  She continues with weakness in Lt hip flexor and limited lumbar ROM (pain in Rt low back with end range Lt side bending and forward flexion).  She  tolerated all exercises well, with minor increase in discomfort.   FOTO score improved  2%.  Pt making gradual progress towards established goals.     Rehab Potential  Good    PT Frequency  2x / week    PT Duration  6 weeks    PT Treatment/Interventions  Iontophoresis 96m/ml Dexamethasone;Dry needling;Manual techniques;Moist Heat;Traction;Ultrasound;Therapeutic activities;Patient/family education;Taping;Vasopneumatic Device;Therapeutic exercise;Cryotherapy;Passive range of motion    PT Next Visit Plan  assess response to combo [place]     Consulted and Agree with Plan of Care  Patient       Patient will benefit from skilled therapeutic  intervention in order to improve the following deficits and impairments:  Pain, Improper body mechanics, Postural dysfunction, Increased muscle spasms, Decreased strength, Decreased range of motion, Impaired flexibility  Visit Diagnosis: Chronic right-sided low back pain with right-sided sciatica  Muscle weakness (generalized)  Other muscle spasm     Problem List Patient Active Problem List   Diagnosis Date Noted  . Colitis 06/17/2017  . Left ovarian cyst 06/17/2017  . Left shoulder pain 09/07/2016  . Degenerative tear of acetabular labrum of left hip 07/15/2016  . Ganglion cyst 02/17/2016  . Generalized anxiety disorder 02/17/2016  . Recurrent sinusitis 02/17/2016  . Vitamin D deficiency 02/15/2014  . Asthma, chronic 12/14/2012  . GERD (gastroesophageal reflux disease) 12/14/2012  . Seasonal allergies 12/14/2012  . Anxiety and depression 12/14/2012  . Transaminitis 12/14/2012  . Myalgia and myositis 10/30/2012  . Other malaise and fatigue 10/26/2012  . Flushing 10/26/2012  . Insomnia 08/13/2012  . Stress and adjustment reaction 08/13/2012   JKerin Perna PTA 08/30/17 1:12 PM  [facility] [place] [address]Phone: [phone]  Fax:  [phone] Name: JJamillah CamiloMRN: [id number] of Birth: 2Aug 21, 1982

## 2017-08-30 NOTE — Patient Instructions (Signed)
Arm / Leg Lift: Opposite (Prone)    Lift leg and opposite arm _2-3___ inches from floor, keeping knee locked. Repeat __5__ times per set. Do ___2_ sets per session. Do _1__ sessions per day.  Child Pose    Sitting on knees, fold body over legs and relax head and arms on floor. Hold for __4__ breaths.  (perform between sets of opposite arm/leg)   [facility] [place], [place] [address]  [phone] (office) [phone] (fax)'

## 2017-08-31 ENCOUNTER — Ambulatory Visit (INDEPENDENT_AMBULATORY_CARE_PROVIDER_SITE_OTHER): Payer: No Typology Code available for payment source | Admitting: Family Medicine

## 2017-08-31 ENCOUNTER — Encounter: Payer: Self-pay | Admitting: Family Medicine

## 2017-08-31 VITALS — BP 121/84 | HR 78 | Ht 65.5 in | Wt 188.0 lb

## 2017-08-31 DIAGNOSIS — M545 Low back pain: Secondary | ICD-10-CM

## 2017-08-31 DIAGNOSIS — S39012A Strain of muscle, fascia and tendon of lower back, initial encounter: Secondary | ICD-10-CM

## 2017-08-31 MED ORDER — DICLOFENAC SODIUM 75 MG PO TBEC: 75.0000 mg | DELAYED_RELEASE_TABLET | Freq: Two times a day (BID) | ORAL | 3 refills | Status: DC | PRN

## 2017-08-31 MED ORDER — CYCLOBENZAPRINE HCL 5 MG PO TABS: 5.0000 mg | ORAL_TABLET | Freq: Three times a day (TID) | ORAL | 1 refills | Status: DC | PRN

## 2017-08-31 MED ORDER — TRAMADOL HCL 50 MG PO TABS: 50.0000 mg | ORAL_TABLET | Freq: Three times a day (TID) | ORAL | 0 refills | Status: DC | PRN

## 2017-08-31 MED FILL — traMADol HCL 50 MG TABS: 50 | 6 days supply | Qty: 20 | Fill #0

## 2017-08-31 MED FILL — DICLOFENAC SOD EC 75 MG TAB: 75 | 30 days supply | Qty: 60 | Fill #0

## 2017-08-31 MED FILL — CYCLOBENZAPRINE 5 MG TABLET: 5 | 15 days supply | Qty: 90 | Fill #0

## 2017-08-31 NOTE — Patient Instructions (Addendum)
Thank you for coming in today. I will communiate with you about the facet injection plan.  Continue home exercises.  Return sooner if needed.  If you need a PT course for a back pain flair up let me know.  Recheck as needed.

## 2017-08-31 NOTE — Progress Notes (Signed)
Tracey Patterson is a 37 y.o. female who presents to [facility] today for back pain follow-up.  Tracey Patterson is here to follow-up her back pain.  She was seen on April 22.  At that time she was experiencing moderate to severe low back pain.  She is had pain off and on for years however it worsened recently.  She notes that she is had surgery in her back and had an MRI in April 2018 and a CT scan of her abdomen in March 2019.  She denies significant radiating pain weakness or numbness.  She notes the pain is located in her right lower back and does not significantly radiate.  Pain waxes and wanes but is improved from physical therapy at night but not all better.  She continues to have pain that can interfere with her ability to work.  She will use tramadol very infrequently but takes diclofenac more frequently.  Like refills of her cyclobenzaprine, diclofenac, and occasional tramadol usage.    ROS:  As above  Exam:  BP 121/84   Pulse 78   Ht 5' 5.5" (1.664 m)   Wt 188 lb (85.3 kg)   BMI 30.81 kg/m  General: Well Developed, well nourished, and in no acute distress.  Neuro/Psych: Alert and oriented x3, extra-ocular muscles intact, able to move all 4 extremities, sensation grossly intact. Skin: Warm and dry, no rashes noted.  Respiratory: Not using accessory muscles, speaking in full sentences, trachea midline.  Cardiovascular: Pulses palpable, no extremity edema. Abdomen: Does not appear distended. MSK:  L-spine: Nontender to midline.  Tender palpation right lumbar paraspinal muscle group.  Decreased back motion due to pain.  Normal gait.    Lab and Radiology Results Images from MRI L-spine dated April 2018, and CT scan abdomen was dated March 2019 viewed.  Patient has postsurgical/degenerative changes at the right L4-L5 facet.  Assessment and Plan: 37 y.o. female with back pain    Plan to communicate with neuroradiology about the  possibility of doing a facet injection in this area.  I think this would be in the next obvious step if reasonable.  I do not think she likely needs more imaging but will obtain new imaging if radiology thinks it would be helpful.  Will communicate with patient once plan is more firmly established.  Refill tramadol, cyclobenzaprine, diclofenac.  Patient researched [place] Controlled Substance Reporting System.   No orders of the defined types were placed in this encounter.  Meds ordered this encounter  Medications  . diclofenac (VOLTAREN) 75 MG EC tablet    Sig: Take 1 tablet (75 mg total) by mouth 2 (two) times daily as needed.    Dispense:  60 tablet    Refill:  3  . traMADol (ULTRAM) 50 MG tablet    Sig: Take 1 tablet (50 mg total) by mouth every 8 (eight) hours as needed (pain or cough).    Dispense:  20 tablet    Refill:  0  . cyclobenzaprine (FLEXERIL) 5 MG tablet    Sig: Take 1-2 tablets (5-10 mg total) by mouth 3 (three) times daily as needed for muscle spasms.    Dispense:  90 tablet    Refill:  1    Historical information moved to improve visibility of documentation.  Past Medical History:  Diagnosis Date  . Anxiety   . Asthma   . History of TMJ disorder    Past Surgical History:  Procedure Laterality Date  .  BACK SURGERY  December 2015  . KNEE ARTHROSCOPY  1998   left  . LAPAROSCOPIC TUBAL LIGATION  02/28/2012   Procedure: LAPAROSCOPIC TUBAL LIGATION;  Surgeon: Cyril Mourning, MD;  Location: [facility];  Service: Gynecology;  Laterality: Bilateral;  . TONSILLECTOMY     Social History   Tobacco Use  . Smoking status: Never Smoker  . Smokeless tobacco: Never Used  Substance Use Topics  . Alcohol use: Yes   family history includes Depression in her mother; Hypertension in her father and mother.  Medications: Current Outpatient Medications  Medication Sig Dispense Refill  . Acetaminophen (TYLENOL ARTHRITIS PAIN PO) Take by mouth.    Marland Kitchen albuterol  (PROVENTIL HFA;VENTOLIN HFA) 108 (90 Base) MCG/ACT inhaler Inhale 2 puffs into the lungs every 4 (four) hours as needed for wheezing (cough, shortness of breath or wheezing.). 1 Inhaler 1  . buPROPion (WELLBUTRIN XL) 300 MG 24 hr tablet Take 1 tablet (300 mg total) by mouth daily. 90 tablet 3  . cetirizine (ZYRTEC) 10 MG tablet Take 1 tablet (10 mg total) by mouth daily. 30 tablet 3  . clonazePAM (KLONOPIN) 0.5 MG tablet Half to full tab daily as needed for anxiety. 90 tablet 1  . cyclobenzaprine (FLEXERIL) 5 MG tablet Take 1-2 tablets (5-10 mg total) by mouth 3 (three) times daily as needed for muscle spasms. 90 tablet 1  . eszopiclone (LUNESTA) 2 MG TABS tablet Take 0.5-1.5 tablets (1-3 mg total) by mouth at bedtime as needed for sleep. Take immediately before bedtime. Use lowest effective dose. 30 tablet 1  . fluticasone (FLONASE) 50 MCG/ACT nasal spray Place 2 sprays into both nostrils daily. 16 g 6  . IBUPROFEN PO Take 800 mg by mouth as needed.     . pantoprazole (PROTONIX) 40 MG tablet Take 1 tablet (40 mg total) by mouth daily. 90 tablet 3  . SUMAtriptan (IMITREX) 50 MG tablet Take 1-2 tablets (50-100 mg total) by mouth once as needed for migraine. May repeat in 2 hours if headache persists or recurs. Limit use to 200 mg (4 tablets) per 24 hours, use no more than 1-2 times per week 30 tablet 0  . traMADol (ULTRAM) 50 MG tablet Take 1 tablet (50 mg total) by mouth every 8 (eight) hours as needed (pain or cough). 20 tablet 0  . diclofenac (VOLTAREN) 75 MG EC tablet Take 1 tablet (75 mg total) by mouth 2 (two) times daily as needed. 60 tablet 3   No current facility-administered medications for this visit.    Allergies  Allergen Reactions  . Crab [Shellfish Allergy] Other (See Comments)    Allergist advises against eating crab.  . Sulfa Antibiotics Hives  . Zoloft [Sertraline Hcl]     Hair loss  . Other Rash    Dermabond, steri strips      Discussed warning signs or symptoms. Please  see discharge instructions. Patient expresses understanding.

## 2017-09-01 ENCOUNTER — Ambulatory Visit (INDEPENDENT_AMBULATORY_CARE_PROVIDER_SITE_OTHER): Payer: No Typology Code available for payment source | Admitting: Osteopathic Medicine

## 2017-09-01 ENCOUNTER — Encounter: Payer: Self-pay | Admitting: Osteopathic Medicine

## 2017-09-01 VITALS — BP 136/95 | HR 98 | Ht 65.51 in | Wt 189.0 lb

## 2017-09-01 DIAGNOSIS — Z0000 Encounter for general adult medical examination without abnormal findings: Secondary | ICD-10-CM | POA: Diagnosis not present

## 2017-09-01 DIAGNOSIS — M25552 Pain in left hip: Secondary | ICD-10-CM | POA: Diagnosis not present

## 2017-09-01 DIAGNOSIS — G8929 Other chronic pain: Secondary | ICD-10-CM

## 2017-09-01 DIAGNOSIS — K219 Gastro-esophageal reflux disease without esophagitis: Secondary | ICD-10-CM

## 2017-09-01 DIAGNOSIS — M545 Low back pain: Secondary | ICD-10-CM | POA: Diagnosis not present

## 2017-09-01 DIAGNOSIS — F411 Generalized anxiety disorder: Secondary | ICD-10-CM

## 2017-09-01 DIAGNOSIS — R768 Other specified abnormal immunological findings in serum: Secondary | ICD-10-CM

## 2017-09-01 DIAGNOSIS — R198 Other specified symptoms and signs involving the digestive system and abdomen: Secondary | ICD-10-CM

## 2017-09-01 DIAGNOSIS — F419 Anxiety disorder, unspecified: Secondary | ICD-10-CM

## 2017-09-01 DIAGNOSIS — Z8739 Personal history of other diseases of the musculoskeletal system and connective tissue: Secondary | ICD-10-CM

## 2017-09-01 DIAGNOSIS — F32A Depression, unspecified: Secondary | ICD-10-CM

## 2017-09-01 DIAGNOSIS — F329 Major depressive disorder, single episode, unspecified: Secondary | ICD-10-CM | POA: Diagnosis not present

## 2017-09-01 MED ORDER — PANTOPRAZOLE SODIUM 40 MG PO TBEC: 40.0000 mg | DELAYED_RELEASE_TABLET | Freq: Every day | ORAL | 3 refills | Status: DC

## 2017-09-01 MED ORDER — BUPROPION HCL ER (XL) 300 MG PO TB24: 300.0000 mg | ORAL_TABLET | Freq: Every day | ORAL | 3 refills | Status: DC

## 2017-09-01 MED ORDER — CLONAZEPAM 0.5 MG PO TABS: ORAL_TABLET | ORAL | 0 refills | Status: DC

## 2017-09-01 MED FILL — BUPROPION HCL XL 300 MG TAB: 300 | 90 days supply | Qty: 90 | Fill #0

## 2017-09-01 MED FILL — clonazePAM 0.5 MG TABS: 0.5 | 90 days supply | Qty: 90 | Fill #0

## 2017-09-01 NOTE — Patient Instructions (Addendum)
General  Most recent routine screening lipids/other labs: need labs soon-ish!  Tobacco: never smoker  Alcohol: none  Recreational/Illicit Drugs: none  Exercise/Dieat: discussed healthy habits and exercise as tolerated   Mental health: if need for mental health care (adjust medicines, counseling, other), or concerns about moods getting worse, please let me know!   Sexual health: IUD in place, if there is a need for STD testing, please let me know!   Vaccines  Flu vaccine: recommended every fall (by Halloween!)  Shingles vaccine: recommended after age 58  Pneumonia vaccines: recommended after age 47 unless other reason such as diabetes, COPD/asthma, others  Tetanus booster: recommended every 10 years  Cancer screenings   Colon cancer screening: recommended at age 43, sooner if risk factors - we could consider this for you to evaluate bowel issues  Breast cancer screening: mammogram recommended at age 21  Cervical cancer screening: every 1 to 5 years depending on age and other risk factors  Infection screenings . HIV: recommended screening at least once age 1-65, more often if risk factors . Gonorrhea/Chlamydia: many insurances require testing for anyone on birth control pills, otherwise screening as needed . Hepatitis C: recommended for anyone born 08-1965  Other . Aspirin: most people don't need it, you don't need it!  Marland Kitchen Bone Density Test: recommended for women at age 77, men at age 41, sooner depending on risk factors . Advanced Directive: Living Will and/or Healthcare Power of Attorney recommended for everyone, regardless of age or health . Cholesterol: recommended now . Diabetes: recommended now . Thyroid and Vitamin D: routine screening not recommended, usually I'll check this anyway as part of annual visit

## 2017-09-01 NOTE — Progress Notes (Signed)
HPI: Tracey Patterson is a 37 y.o. female who  has a past medical history of Anxiety, Asthma, and History of TMJ disorder.  she presents to [facility] today, 09/01/17,  for chief complaint of: Annual Physical     Patient here for annual physical / wellness exam.  See preventive care reviewed as below.  Recent labs reviewed in detail with the patient.   Additional concerns today include:  History of fibromyalgia, chronic low back pain and left hip pain.  She states she had a previous work-up by rheumatology many years ago which was negative but she just feels particularly worried about multiple issues with pain, would like to get blood work repeated    Past medical, surgical, social and family history reviewed:  Patient Active Problem List   Diagnosis Date Noted  . Colitis 06/17/2017  . Left ovarian cyst 06/17/2017  . Left shoulder pain 09/07/2016  . Degenerative tear of acetabular labrum of left hip 07/15/2016  . Ganglion cyst 02/17/2016  . Generalized anxiety disorder 02/17/2016  . Recurrent sinusitis 02/17/2016  . Vitamin D deficiency 02/15/2014  . Asthma, chronic 12/14/2012  . GERD (gastroesophageal reflux disease) 12/14/2012  . Seasonal allergies 12/14/2012  . Anxiety and depression 12/14/2012  . Transaminitis 12/14/2012  . Myalgia and myositis 10/30/2012  . Other malaise and fatigue 10/26/2012  . Flushing 10/26/2012  . Insomnia 08/13/2012  . Stress and adjustment reaction 08/13/2012    Past Surgical History:  Procedure Laterality Date  . BACK SURGERY  December 2015  . KNEE ARTHROSCOPY  1998   left  . LAPAROSCOPIC TUBAL LIGATION  02/28/2012   Procedure: LAPAROSCOPIC TUBAL LIGATION;  Surgeon: Cyril Mourning, MD;  Location: [facility];  Service: Gynecology;  Laterality: Bilateral;  . TONSILLECTOMY      Social History   Tobacco Use  . Smoking status: Never Smoker  . Smokeless tobacco: Never Used  Substance Use Topics  .  Alcohol use: Yes    Family History  Problem Relation Age of Onset  . Hypertension Mother   . Depression Mother   . Hypertension Father      Current medication list and allergy/intolerance information reviewed:    Current Outpatient Medications  Medication Sig Dispense Refill  . Acetaminophen (TYLENOL ARTHRITIS PAIN PO) Take by mouth.    Marland Kitchen albuterol (PROVENTIL HFA;VENTOLIN HFA) 108 (90 Base) MCG/ACT inhaler Inhale 2 puffs into the lungs every 4 (four) hours as needed for wheezing (cough, shortness of breath or wheezing.). 1 Inhaler 1  . buPROPion (WELLBUTRIN XL) 300 MG 24 hr tablet Take 1 tablet (300 mg total) by mouth daily. 90 tablet 3  . cetirizine (ZYRTEC) 10 MG tablet Take 1 tablet (10 mg total) by mouth daily. 30 tablet 3  . clonazePAM (KLONOPIN) 0.5 MG tablet Half to full tab daily as needed for anxiety. 90 tablet 1  . cyclobenzaprine (FLEXERIL) 5 MG tablet Take 1-2 tablets (5-10 mg total) by mouth 3 (three) times daily as needed for muscle spasms. 90 tablet 1  . diclofenac (VOLTAREN) 75 MG EC tablet Take 1 tablet (75 mg total) by mouth 2 (two) times daily as needed. 60 tablet 3  . eszopiclone (LUNESTA) 2 MG TABS tablet Take 0.5-1.5 tablets (1-3 mg total) by mouth at bedtime as needed for sleep. Take immediately before bedtime. Use lowest effective dose. 30 tablet 1  . fluticasone (FLONASE) 50 MCG/ACT nasal spray Place 2 sprays into both nostrils daily. 16 g 6  . IBUPROFEN PO Take  800 mg by mouth as needed.     . pantoprazole (PROTONIX) 40 MG tablet Take 1 tablet (40 mg total) by mouth daily. 90 tablet 3  . SUMAtriptan (IMITREX) 50 MG tablet Take 1-2 tablets (50-100 mg total) by mouth once as needed for migraine. May repeat in 2 hours if headache persists or recurs. Limit use to 200 mg (4 tablets) per 24 hours, use no more than 1-2 times per week 30 tablet 0  . traMADol (ULTRAM) 50 MG tablet Take 1 tablet (50 mg total) by mouth every 8 (eight) hours as needed (pain or cough). 20  tablet 0   No current facility-administered medications for this visit.     Allergies  Allergen Reactions  . Crab [Shellfish Allergy] Other (See Comments)    Allergist advises against eating crab.  . Sulfa Antibiotics Hives  . Zoloft [Sertraline Hcl]     Hair loss  . Other Rash    Dermabond, steri strips      Review of Systems:  Constitutional:  No  fever, no chills, No recent illness, No unintentional weight changes. No significant fatigue.   HEENT: No  headache, no vision change, no hearing change, No sore throat, No  sinus pressure  Cardiac: No  chest pain, No  pressure, No palpitations, No  Orthopnea  Respiratory:  No  shortness of breath. No  Cough  Gastrointestinal: No  abdominal pain, No  nausea, No  vomiting,  No  blood in stool, +diarrhea, +constipation   Musculoskeletal: No new myalgia/arthralgia  Skin: No  Rash  Genitourinary: No  incontinence, No  abnormal genital bleeding, No abnormal genital discharge  Hem/Onc: No  easy bruising/bleeding, No  abnormal lymph node  Endocrine: No cold intolerance,  No heat intolerance.    Neurologic: No  weakness, No  dizziness  Psychiatric: +concerns with depression, +concerns with anxiety, No sleep problems, No mood problems  GAD 7 : Generalized Anxiety Score 09/01/2017 03/24/2016 02/17/2016  Nervous, Anxious, on Edge 2 2 3   Control/stop worrying 2 2 3   Worry too much - different things 2 2 3   Trouble relaxing 2 2 3   Restless 1 2 2   Easily annoyed or irritable 2 1 3   Afraid - awful might happen 0 1 0  Total GAD 7 Score 11 12 17   Anxiety Difficulty Somewhat difficult Somewhat difficult Very difficult    Depression screen [facility] 2/9 09/01/2017 12/02/2016 03/24/2016  Decreased Interest 1 0 1  Down, Depressed, Hopeless 1 1 1   PHQ - 2 Score 2 1 2   Altered sleeping 0 0 2  Tired, decreased energy 3 0 2  Change in appetite 1 0 3  Feeling bad or failure about yourself  0 0 2  Trouble concentrating 2 0 1  Moving slowly or  fidgety/restless 1 0 1  Suicidal thoughts 0 0 0  PHQ-9 Score 9 1 13   Difficult doing work/chores Somewhat difficult - -  Some encounter information is confidential and restricted. Go to Review Flowsheets activity to see all data.     Exam:  BP (!) 136/95   Pulse 98   Ht 5' 5.51" (1.664 m)   Wt 189 lb (85.7 kg)   SpO2 99%   BMI 30.96 kg/m   Constitutional: VS see above. General Appearance: alert, well-developed, well-nourished, NAD  Eyes: Normal lids and conjunctive, non-icteric sclera  Ears, Nose, Mouth, Throat: MMM, Normal external inspection ears/nares/mouth/lips/gums. TM normal bilaterally. Pharynx/tonsils no erythema, no exudate. Nasal mucosa normal.   Neck: No masses,  trachea midline. No thyroid enlargement. No tenderness/mass appreciated. No lymphadenopathy  Respiratory: Normal respiratory effort. no wheeze, no rhonchi, no rales  Cardiovascular: S1/S2 normal, no murmur, no rub/gallop auscultated. RRR. No lower extremity edema. Pedal pulse II/IV bilaterally DP and PT. No carotid bruit or JVD. No abdominal aortic bruit.  Gastrointestinal: Nontender, no masses. No hepatomegaly, no splenomegaly. No hernia appreciated. Bowel sounds normal. Rectal exam deferred.   Musculoskeletal: Gait normal. No clubbing/cyanosis of digits.   Neurological: Normal balance/coordination. No tremor. No cranial nerve deficit on limited exam. Motor and sensation intact and symmetric. Cerebellar reflexes intact.   Skin: warm, dry, intact. No rash/ulcer. No concerning nevi or subq nodules on limited exam.    Psychiatric: Normal judgment/insight. Normal mood and affect. Oriented x3.    No results found for this or any previous visit (from the past 72 hour(s)).  No results found.   ASSESSMENT/PLAN:   Annual physical exam - Plan: CBC, COMPLETE METABOLIC PANEL WITH GFR, Lipid panel, VITAMIN D 25 Hydroxy (Vit-D Deficiency, Fractures), TSH, HIV antibody  Alternating constipation and diarrhea -  Patient states she is not overly bothered by the symptoms.  Suspect IBS.  Let me know if treatment desired. - Plan: CBC, COMPLETE METABOLIC PANEL WITH GFR, TSH  Generalized anxiety disorder - Refill medications - Plan: buPROPion (WELLBUTRIN XL) 300 MG 24 hr tablet  Gastroesophageal reflux disease, esophagitis presence not specified - Fill medications - Plan: pantoprazole (PROTONIX) 40 MG tablet  Anxiety and depression - Refill medications, she is taking clonazepam maybe every few days, sometimes goes a while without it sometimes a few days in a row - Plan: clonazePAM (KLONOPIN) 0.5 MG tablet  Chronic bilateral low back pain without sciatica - Devious work-up for rheumatologic issue was negative but this was several years ago.  She would like to repeat this lab work - Plan: ANA, CK, High sensitivity CRP, Rheumatoid factor, Sedimentation rate, ANA  History of fibromyalgia - Plan: ANA, CK, High sensitivity CRP, Rheumatoid factor, Sedimentation rate, ANA  Chronic left hip pain - Plan: ANA, CK, High sensitivity CRP, Rheumatoid factor, Sedimentation rate, ANA    Patient Instructions  General  Most recent routine screening lipids/other labs: need labs soon-ish!  Tobacco: never smoker  Alcohol: none  Recreational/Illicit Drugs: none  Exercise/Dieat: discussed healthy habits and exercise as tolerated   Mental health: if need for mental health care (adjust medicines, counseling, other), or concerns about moods getting worse, please let me know!   Sexual health: IUD in place, if there is a need for STD testing, please let me know!   Vaccines  Flu vaccine: recommended every fall (by Halloween!)  Shingles vaccine: recommended after age 9  Pneumonia vaccines: recommended after age 60 unless other reason such as diabetes, COPD/asthma, others  Tetanus booster: recommended every 10 years  Cancer screenings   Colon cancer screening: recommended at age 34, sooner if risk factors - we could  consider this for you to evaluate bowel issues  Breast cancer screening: mammogram recommended at age 30  Cervical cancer screening: every 1 to 5 years depending on age and other risk factors  Infection screenings . HIV: recommended screening at least once age 51-65, more often if risk factors . Gonorrhea/Chlamydia: many insurances require testing for anyone on birth control pills, otherwise screening as needed . Hepatitis C: recommended for anyone born 52-1967  Other . Aspirin: most people don't need it, you don't need it!  Marland Kitchen Bone Density Test: recommended for women at age 39, men  at age 28, sooner depending on risk factors . Advanced Directive: Living Will and/or Healthcare Power of Attorney recommended for everyone, regardless of age or health . Cholesterol: recommended now . Diabetes: recommended now . Thyroid and Vitamin D: routine screening not recommended, usually I'll check this anyway as part of annual visit      Visit summary with medication list and pertinent instructions was printed for patient to review. All questions at time of visit were answered - patient instructed to contact office with any additional concerns. ER/RTC precautions were reviewed with the patient.   Follow-up plan: Return in about 1 year (around 09/02/2018) for annual check-up, sooner if needed! .    Please note: voice recognition software was used to produce this document, and typos may escape review. Please contact Dr. Sheppard Coil for any needed clarifications.

## 2017-09-02 ENCOUNTER — Encounter: Payer: Self-pay | Admitting: Osteopathic Medicine

## 2017-09-02 ENCOUNTER — Ambulatory Visit: Payer: No Typology Code available for payment source | Admitting: Physical Therapy

## 2017-09-02 DIAGNOSIS — M62838 Other muscle spasm: Secondary | ICD-10-CM

## 2017-09-02 DIAGNOSIS — M5441 Lumbago with sciatica, right side: Secondary | ICD-10-CM | POA: Diagnosis not present

## 2017-09-02 DIAGNOSIS — M6281 Muscle weakness (generalized): Secondary | ICD-10-CM

## 2017-09-02 DIAGNOSIS — G8929 Other chronic pain: Secondary | ICD-10-CM

## 2017-09-02 NOTE — Patient Instructions (Addendum)
Pelvic Press     Place hands under belly between navel and pubic bone, palms up. Feel pressure on hands. Increase pressure on hands by pressing pelvis down. This is NOT a pelvic tilt. Hold __5_ seconds. Relax. Repeat _10__ times. Once a day.  KNEE: Flexion - Prone   Hold pelvic press. Bend knee, then return the foot down. Repeat on opposite leg. Do not raise hips. _10__ reps per set. When this is mastered, pull both heels up at same time, x 10 reps.  Once a day  Arm / Leg Lift: Opposite (Prone)    Lift right leg and opposite arm __3__ inches from floor, keeping knee locked. Repeat __5-10__ times per set. Do __2__ sets per session. Do __4__ sessions per week.  PELVIC STABILIZATION: Progressive Bridge    Exhaling, lift hips, waist, and rib cage. Hold for _3__ breaths. Exhaling, release rib cage, waist, and hips back to the floor. Repeat __5-10_ times. Do _1__ times per day.  As hips lift, raise arms up to ceiling and beside ears. Arms stay parallel with palms facing up. Lowering hips, bring arms to sides.  Abduction: Clam (Eccentric) - Side-Lying    Lie on side with knees bent. Lift top knee, keeping feet together. Keep trunk steady. Slowly lower for 3-5 seconds. __10_ reps per set, _2__ sets per day, _3__ days per week.   [facility] at [facility] [place], [place] [address]  [phone] (office) [phone] (fax)

## 2017-09-02 NOTE — Therapy (Addendum)
[place], [place], [address] Phone: [phone]   Fax:  [phone]  Physical Therapy Treatment  Patient Details  Name: Tracey Patterson MRN: [id number] Date of Birth: 1980-06-21 Referring Provider: Dr. [name]    Encounter Date: 09/02/2017  PT End of Session - 09/02/17 1113    Visit Number  8    Number of Visits  12    Date for PT Re-Evaluation  09/14/17    PT Start Time  1110    PT Stop Time  1209    PT Time Calculation (min)  59 min       Past Medical History:  Diagnosis Date  . Anxiety   . Asthma   . History of TMJ disorder     Past Surgical History:  Procedure Laterality Date  . BACK SURGERY  December 2015  . KNEE ARTHROSCOPY  1998   left  . LAPAROSCOPIC TUBAL LIGATION  02/28/2012   Procedure: LAPAROSCOPIC TUBAL LIGATION;  Surgeon: Cyril Mourning, MD;  Location: [facility];  Service: Gynecology;  Laterality: Bilateral;  . TONSILLECTOMY      There were no vitals filed for this visit.  Subjective Assessment - 09/02/17 1120    Subjective  Pt reports she visited MD this week.  Per pt, MD wants her to get a HEP and put therapy on hold.  She is to have a neuroradiology consult regarding possible injection.  She reports she was not as stiff when she left last appt.   She had difficulty sitting through dinner that evening and pain returned.     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  Yes    Pain Score  4     Pain Location  Back    Pain Orientation  Right;Lower    Pain Descriptors / Indicators  Sharp pinching    Aggravating Factors   prolonged sitting, transitional movements    Pain Relieving Factors  lying down.          [facility] PT Assessment - 09/02/17 0001      Assessment   Medical Diagnosis  Lumbosacral sprain, Lt shoulder pain    Referring Provider  Dr. [name]     Onset  Date/Surgical Date  07/06/17    Hand Dominance  Right    Next MD Visit  PRN       [facility] Adult PT Treatment/Exercise - 09/02/17 0001      Self-Care   Self-Care  Other Self-Care Comments    Other Self-Care Comments   re-educated pt regarding self massage with ball to lumbar musculature to decrease fascial tightness.       Lumbar Exercises: Stretches   Passive Hamstring Stretch  Right;Left;2 reps;30 seconds    Press Ups  10 seconds;3 reps    Piriformis Stretch  Right;Left;3 reps;20 seconds 2 reps sitting; 1 modified pigeon pose    Other Lumbar Stretch Exercise  childs pose x 3 reps      Lumbar Exercises: Aerobic   Nustep  L4 x 6.5 arms and legs  PTA present to discuss progress      Lumbar Exercises: Seated   Other Seated Lumbar Exercises  seated Lt hip flexion with core engaged x 5 reps; 2 sets.  backward leans (gentle) with alternating arm flexion to 90 deg for core engagement.  Lumbar Exercises: Supine   Bridge  10 reps core engaged, with arm press.       Lumbar Exercises: Sidelying   Clam  Right;Left;10 reps;3 seconds core engaged       Lumbar Exercises: Prone   Opposite Arm/Leg Raise  Right arm/Left leg;Left arm/Right leg;2 seconds;10 reps 2 sets    Other Prone Lumbar Exercises  pelvic press x 10 reps of 5 sec; pelvc press with unilateral knee bend x 5 each leg.       Acupuncturist Location  Rt lower lumbar paraspinals/ QL    Electrical Stimulation Action  combo [place]    Electrical Stimulation Parameters  to pt tolerance    Electrical Stimulation Goals  Pain      Ultrasound   Ultrasound Location  see estim    Ultrasound Parameters  100%, 1.2 w/cm2, 8 min     Ultrasound Goals  Pain             PT Education - 09/02/17 1142    Education provided  Yes    Education Details  HEP     Person(s) Educated  Patient    Methods  Explanation    Comprehension  Verbalized understanding          PT Long Term Goals - 08/30/17 1310       PT LONG TERM GOAL #1   Title  I with advanced HEP ( 09/14/17)    Time  6    Period  Weeks    Status  On-going      PT LONG TERM GOAL #2   Title  demo painfree Lumbar ROM WFL ( 09/14/17)      Time  6    Period  Weeks    Status  On-going improved; pain with flexion and sidebending      PT LONG TERM GOAL #3   Title  improve Lt shoulder strength =/> 5-/5 to allow her to perform her work without difficulty ( 09/14/17)    Time  6    Period  Weeks    Status  Achieved      PT LONG TERM GOAL #4   Title  improve Lt hip strength to [facility] to allow her walk at work with out difficulty  ( 09/14/17)     Time  6    Period  Weeks    Status  On-going improved.       PT LONG TERM GOAL #5   Title  report =/> 75% reduction of back pain at the end of a work shift ( 09/14/17)     Time  6    Period  Weeks    Status  On-going 25% reported 08/30/17      PT LONG TERM GOAL #6   Title  improve FOTO =/< 40% limited ( 09/14/17)     Time  6    Period  Weeks    Status  On-going            Plan - 09/02/17 1137    Clinical Impression Statement  Pt tolerated all exercises well, with only minor increase in low back discomfort.  She required stretch breaks in between core strengthening exercises to decrease discomfort. Reviewed HEP and made modifications as needed.  No new goals met since last visit.      Rehab Potential  Good    PT Frequency  2x / week    PT Duration  6 weeks  PT Treatment/Interventions  Iontophoresis 20m/ml Dexamethasone;Dry needling;Manual techniques;Moist Heat;Traction;Ultrasound;Therapeutic activities;Patient/family education;Taping;Vasopneumatic Device;Therapeutic exercise;Cryotherapy;Passive range of motion    PT Next Visit Plan  will hold therapy for 2 wks per pt request.  if pt doesn't return by 6/24; will d/c.     Consulted and Agree with Plan of Care  Patient       Patient will benefit from skilled therapeutic intervention in order to improve the following deficits and  impairments:  Pain, Improper body mechanics, Postural dysfunction, Increased muscle spasms, Decreased strength, Decreased range of motion, Impaired flexibility  Visit Diagnosis: Chronic right-sided low back pain with right-sided sciatica  Muscle weakness (generalized)  Other muscle spasm     Problem List Patient Active Problem List   Diagnosis Date Noted  . Colitis 06/17/2017  . Left ovarian cyst 06/17/2017  . Left shoulder pain 09/07/2016  . Degenerative tear of acetabular labrum of left hip 07/15/2016  . Ganglion cyst 02/17/2016  . Generalized anxiety disorder 02/17/2016  . Recurrent sinusitis 02/17/2016  . Vitamin D deficiency 02/15/2014  . Asthma, chronic 12/14/2012  . GERD (gastroesophageal reflux disease) 12/14/2012  . Seasonal allergies 12/14/2012  . Anxiety and depression 12/14/2012  . Transaminitis 12/14/2012  . Myalgia and myositis 10/30/2012  . Other malaise and fatigue 10/26/2012  . Flushing 10/26/2012  . Insomnia 08/13/2012  . Stress and adjustment reaction 08/13/2012   Tracey Patterson PTA 09/02/17 1:53 PM  [facility] [place] [address] [address]Phone: [phone]  Fax:  [phone] Name: Tracey PerdueMRN: [id number] of Birth: [birth year]  PHYSICAL THERAPY DISCHARGE SUMMARY  Visits from Start of Care: 8  Current functional level related to goals / functional outcomes: Pt continues to have pain, is working with MD to try injections   Remaining deficits: unknown   Education / Equipment: HEP Plan: Patient agrees to discharge.  Patient goals were partially met. Patient is being discharged due to a change in medical status.  ?????    SJeral Pinch PT 09/19/17 7:38 AM

## 2017-09-03 LAB — CBC
HCT: 38.6 % (ref 35.0–45.0)
HEMOGLOBIN: 13.1 g/dL (ref 11.7–15.5)
MCH: 30.3 pg (ref 27.0–33.0)
MCHC: 33.9 g/dL (ref 32.0–36.0)
MCV: 89.1 fL (ref 80.0–100.0)
MPV: 9.3 fL (ref 7.5–12.5)
PLATELETS: 427 10*3/uL — AB (ref 140–400)
RBC: 4.33 10*6/uL (ref 3.80–5.10)
RDW: 12.9 % (ref 11.0–15.0)
WBC: 7.5 10*3/uL (ref 3.8–10.8)

## 2017-09-03 LAB — COMPLETE METABOLIC PANEL WITH GFR
AG RATIO: 1.6 (calc) (ref 1.0–2.5)
ALBUMIN MSPROF: 4.2 g/dL (ref 3.6–5.1)
ALT: 64 U/L — ABNORMAL HIGH (ref 6–29)
AST: 28 U/L (ref 10–30)
Alkaline phosphatase (APISO): 137 U/L — ABNORMAL HIGH (ref 33–115)
BUN: 13 mg/dL (ref 7–25)
CALCIUM: 9.3 mg/dL (ref 8.6–10.2)
CO2: 25 mmol/L (ref 20–32)
Chloride: 104 mmol/L (ref 98–110)
Creat: 0.82 mg/dL (ref 0.50–1.10)
GFR, EST AFRICAN AMERICAN: 104 mL/min/{1.73_m2} (ref >=60)
GFR, EST NON AFRICAN AMERICAN: 90 mL/min/{1.73_m2} (ref >=60)
GLOBULIN: 2.7 g/dL (ref 1.9–3.7)
Glucose, Bld: 88 mg/dL (ref 65–99)
POTASSIUM: 4.3 mmol/L (ref 3.5–5.3)
Sodium: 137 mmol/L (ref 135–146)
Total Bilirubin: 0.4 mg/dL (ref 0.2–1.2)
Total Protein: 6.9 g/dL (ref 6.1–8.1)

## 2017-09-03 LAB — SEDIMENTATION RATE: Sed Rate: 6 mm/h (ref 0–20)

## 2017-09-03 LAB — LIPID PANEL
Cholesterol: 195 mg/dL (ref <200)
HDL: 53 mg/dL (ref >50)
LDL Cholesterol (Calc): 113 mg/dL (calc) — ABNORMAL HIGH
Non-HDL Cholesterol (Calc): 142 mg/dL (calc) — ABNORMAL HIGH (ref <130)
TRIGLYCERIDES: 169 mg/dL — AB (ref <150)
Total CHOL/HDL Ratio: 3.7 (calc) (ref <5.0)

## 2017-09-03 LAB — CK: Total CK: 52 U/L (ref 29–143)

## 2017-09-03 LAB — HIGH SENSITIVITY CRP: HS-CRP: 3.4 mg/L — AB

## 2017-09-03 LAB — VITAMIN D 25 HYDROXY (VIT D DEFICIENCY, FRACTURES): VIT D 25 HYDROXY: 22 ng/mL — AB (ref 30–100)

## 2017-09-03 LAB — RHEUMATOID FACTOR: Rhuematoid fact SerPl-aCnc: <14 IU/mL (ref <14)

## 2017-09-03 LAB — TSH: TSH: 1.24 m[IU]/L

## 2017-09-06 ENCOUNTER — Encounter: Payer: Self-pay | Admitting: Physical Therapy

## 2017-09-06 LAB — ANA: Anti Nuclear Antibody(ANA): POSITIVE — AB

## 2017-09-06 LAB — HIV ANTIBODY (ROUTINE TESTING W REFLEX): HIV 1&2 Ab, 4th Generation: NONREACTIVE

## 2017-09-06 LAB — ANTI-NUCLEAR AB-TITER (ANA TITER)

## 2017-09-07 ENCOUNTER — Encounter: Payer: Self-pay | Admitting: Family Medicine

## 2017-09-07 DIAGNOSIS — M545 Low back pain: Principal | ICD-10-CM

## 2017-09-07 DIAGNOSIS — G8929 Other chronic pain: Secondary | ICD-10-CM

## 2017-09-08 ENCOUNTER — Encounter: Payer: Self-pay | Admitting: Physical Therapy

## 2017-09-09 ENCOUNTER — Encounter: Payer: Self-pay | Admitting: Osteopathic Medicine

## 2017-09-12 NOTE — Addendum Note (Signed)
Addended by: Maryla Morrow on: 09/12/2017 10:03 AM   Modules accepted: Orders

## 2017-09-13 ENCOUNTER — Encounter: Payer: Self-pay | Admitting: Osteopathic Medicine

## 2017-09-20 ENCOUNTER — Telehealth: Payer: Self-pay

## 2017-09-20 NOTE — Telephone Encounter (Signed)
I looked at referral note, no details why denied, but probably because absence of other symptoms of autoimmune problems or other lab abnormalities, as ANA can be positive in normal individuals. Tracey Patterson - we can see if pt would like [place] to try referral somewhere else but I think we will run into the same issue again.

## 2017-09-20 NOTE — Telephone Encounter (Signed)
Pt called stating that referral for Rheumatologist was denied. Pt is requesting a call back at [phone].

## 2017-09-21 NOTE — Telephone Encounter (Signed)
Done

## 2017-09-23 ENCOUNTER — Other Ambulatory Visit: Payer: Self-pay

## 2017-09-26 ENCOUNTER — Telehealth: Payer: Self-pay

## 2017-09-26 NOTE — Telephone Encounter (Signed)
On [employer] plan, she needs to look up on the [facility] website and let us know where she'd like to go

## 2017-09-26 NOTE — Telephone Encounter (Signed)
Pt called stating that she received a called from Rheumatologist office in [place], but cannot make an appt. Insurance will not cover rheumatalogist - out of network, pt has [employer] plan. Pt is requesting another referral and prefers [place] location.

## 2017-09-27 ENCOUNTER — Encounter: Payer: Self-pay | Admitting: Osteopathic Medicine

## 2017-09-27 DIAGNOSIS — R768 Other specified abnormal immunological findings in serum: Secondary | ICD-10-CM

## 2017-09-27 NOTE — Telephone Encounter (Signed)
Called patient and LM on VM to call [facility] and see what rheumatologist they will cover and let Dr. Sheppard Coil know. KG LPN

## 2017-10-04 ENCOUNTER — Inpatient Hospital Stay
Admission: RE | Admit: 2017-10-04 | Discharge: 2017-10-04 | Disposition: A | Discharge status: Home / Self Care | Payer: Self-pay | Source: Ambulatory Visit | Attending: Family Medicine | Admitting: Family Medicine

## 2017-10-07 MED FILL — PANTOPRAZOLE SOD DR 40 MG T: 40 | 90 days supply | Qty: 90 | Fill #2

## 2017-10-12 ENCOUNTER — Ambulatory Visit
Admission: RE | Admit: 2017-10-12 | Discharge: 2017-10-12 | Disposition: A | Discharge status: Home / Self Care | Payer: No Typology Code available for payment source | Source: Ambulatory Visit | Attending: Family Medicine | Admitting: Family Medicine

## 2017-10-12 MED ORDER — METHYLPREDNISOLONE ACETATE 40 MG/ML INJ SUSP (RADIOLOG
120.0000 mg | Freq: Once | INTRAMUSCULAR | Status: AC
  Administered 2017-10-12: 120 mg via INTRA_ARTICULAR

## 2017-10-12 MED ORDER — IOPAMIDOL (ISOVUE-M 200) INJECTION 41%
1.0000 mL | Freq: Once | INTRAMUSCULAR | Status: AC
  Administered 2017-10-12: 1 mL via INTRA_ARTICULAR

## 2017-10-12 NOTE — Discharge Instructions (Signed)

## 2017-10-21 ENCOUNTER — Encounter: Payer: Self-pay | Admitting: Osteopathic Medicine

## 2017-10-26 ENCOUNTER — Encounter: Payer: Self-pay | Admitting: Osteopathic Medicine

## 2017-11-02 ENCOUNTER — Ambulatory Visit (INDEPENDENT_AMBULATORY_CARE_PROVIDER_SITE_OTHER): Payer: No Typology Code available for payment source | Admitting: Osteopathic Medicine

## 2017-11-02 ENCOUNTER — Encounter: Payer: Self-pay | Admitting: Osteopathic Medicine

## 2017-11-02 VITALS — BP 129/83 | HR 85 | Temp 98.2°F | Wt 194.1 lb

## 2017-11-02 DIAGNOSIS — M797 Fibromyalgia: Secondary | ICD-10-CM

## 2017-11-02 MED ORDER — AMITRIPTYLINE HCL 50 MG PO TABS: 25.0000 mg | ORAL_TABLET | Freq: Every day | ORAL | 1 refills | Status: DC

## 2017-11-02 MED FILL — AMITRIPTYLINE HCL 50 MG TAB: 50 | 45 days supply | Qty: 90 | Fill #0

## 2017-11-02 NOTE — Patient Instructions (Addendum)
Fibromyalgia:  Will try Amitriptyline at bedtime  Can increase dose as directed  Continue other current medications Let's revisit in a month or so and see hoe it's doing Let mw know sooner if any problems!   Constipation: Fiber regimen daily Continue water   Both: Walking!

## 2017-11-02 NOTE — Progress Notes (Signed)
HPI: Tracey Patterson is a 37 y.o. female who  has a past medical history of Anxiety, Asthma, and History of TMJ disorder.  she presents to [facility] today, 11/02/17,  for chief complaint of:  Follow up pain, fibromyalgia  Recently seen by rheumatologist.  Positive ANA but no strong suspicion for rheumatologic/autoimmune disorder as cause of chronic pain.  Chronic pain seems more likely fibromyalgia type syndrome.  We discussed treatments for this.  She has been on multiple SSRIs/SNRI in the past.  Has been on multiple NSAIDs.  Getting much in the way of intentional physical activity.  Reports anxiety and sleep issues       Past medical history, surgical history, and family history reviewed.  Current medication list and allergy/intolerance information reviewed.   (See remainder of HPI, ROS, Phys Exam below)    ASSESSMENT/PLAN:   Fibromyalgia   We discussed amitriptyline might be a good option to augment anxiety/depression treatment, hopefully help with sleep, as well as IBS.   Meds ordered this encounter  Medications  . amitriptyline (ELAVIL) 50 MG tablet    Sig: Take 0.5-2 tablets (25-100 mg total) by mouth at bedtime. Increase by 0.5 tablets every 2-3 days    Dispense:  90 tablet    Refill:  1    Patient Instructions  Fibromyalgia:  Will try Amitriptyline at bedtime  Can increase dose as directed  Continue other current medications Let's revisit in a month or so and see hoe it's doing Let mw know sooner if any problems!   Constipation: Fiber regimen daily Continue water   Both: Walking!   Follow-up plan: Return in about 1 month (around 11/30/2017) for recheck fibromyalgia, sooner if needed .                 ############################################ ############################################ ############################################ ############################################    Outpatient  Encounter Medications as of 11/02/2017  Medication Sig  . Acetaminophen (TYLENOL ARTHRITIS PAIN PO) Take by mouth.  Marland Kitchen albuterol (PROVENTIL HFA;VENTOLIN HFA) 108 (90 Base) MCG/ACT inhaler Inhale 2 puffs into the lungs every 4 (four) hours as needed for wheezing (cough, shortness of breath or wheezing.).  Marland Kitchen buPROPion (WELLBUTRIN XL) 300 MG 24 hr tablet Take 1 tablet (300 mg total) by mouth daily.  . cetirizine (ZYRTEC) 10 MG tablet Take 1 tablet (10 mg total) by mouth daily.  . clonazePAM (KLONOPIN) 0.5 MG tablet Half to full tab daily as needed for anxiety.  . cyclobenzaprine (FLEXERIL) 5 MG tablet Take 1-2 tablets (5-10 mg total) by mouth 3 (three) times daily as needed for muscle spasms.  . diclofenac (VOLTAREN) 75 MG EC tablet Take 1 tablet (75 mg total) by mouth 2 (two) times daily as needed.  . eszopiclone (LUNESTA) 2 MG TABS tablet Take 0.5-1.5 tablets (1-3 mg total) by mouth at bedtime as needed for sleep. Take immediately before bedtime. Use lowest effective dose.  . fluticasone (FLONASE) 50 MCG/ACT nasal spray Place 2 sprays into both nostrils daily.  . IBUPROFEN PO Take 800 mg by mouth as needed.   . pantoprazole (PROTONIX) 40 MG tablet Take 1 tablet (40 mg total) by mouth daily.  . SUMAtriptan (IMITREX) 50 MG tablet Take 1-2 tablets (50-100 mg total) by mouth once as needed for migraine. May repeat in 2 hours if headache persists or recurs. Limit use to 200 mg (4 tablets) per 24 hours, use no more than 1-2 times per week  . traMADol (ULTRAM) 50 MG tablet Take 1 tablet (50 mg  total) by mouth every 8 (eight) hours as needed (pain or cough).   No facility-administered encounter medications on file as of 11/02/2017.    Allergies  Allergen Reactions  . Sulfa Antibiotics Hives  . Zoloft [Sertraline Hcl] Other (See Comments)    Hair loss  . Crab [Shellfish Allergy] Other (See Comments)    Allergist advises against eating crab.  . Other Rash    Dermabond, steri strips      Review of  Systems:  Constitutional: No recent illness  HEENT: +headache, no vision change  Cardiac: No  chest pain, No  pressure, No palpitations  Respiratory:  No  shortness of breath. No  Cough  Gastrointestinal: +abdominal pain, no change on bowel habits, +constipation  Musculoskeletal: +myalgia/arthralgia  Skin: No  Rash  Hem/Onc: No  easy bruising/bleeding  Neurologic: No  weakness, No  Dizziness  Psychiatric: +concerns with depression, +concerns with anxiety  Exam:  BP 129/83 (BP Location: Left Arm, Patient Position: Sitting, Cuff Size: Normal)   Pulse 85   Temp 98.2 F (36.8 C) (Oral)   Wt 194 lb 1.6 oz (88 kg)   BMI 31.80 kg/m   Constitutional: VS see above. General Appearance: alert, well-developed, well-nourished, NAD  Eyes: Normal lids and conjunctive, non-icteric sclera  Ears, Nose, Mouth, Throat: MMM, Normal external inspection ears/nares/mouth/lips/gums.  Neck: No masses, trachea midline.   Respiratory: Normal respiratory effort. no wheeze, no rhonchi, no rales  Cardiovascular: S1/S2 normal, no murmur, no rub/gallop auscultated. RRR.   Musculoskeletal: Gait normal. Symmetric and independent movement of all extremities  Neurological: Normal balance/coordination. No tremor.  Skin: warm, dry, intact.   Psychiatric: Normal judgment/insight. Normal mood and affect. Oriented x3.   Visit summary with medication list and pertinent instructions was printed for patient to review, advised to alert [place] if any changes needed. All questions at time of visit were answered - patient instructed to contact office with any additional concerns. ER/RTC precautions were reviewed with the patient and understanding verbalized.   Follow-up plan: Return in about 1 month (around 11/30/2017) for recheck fibromyalgia, sooner if needed .  Note: Total time spent 25 minutes, greater than 50% of the visit was spent face-to-face counseling and coordinating care for the following: The encounter  diagnosis was Fibromyalgia..  Please note: voice recognition software was used to produce this document, and typos may escape review. Please contact Dr. Sheppard Coil for any needed clarifications.

## 2017-11-03 ENCOUNTER — Encounter: Payer: Self-pay | Admitting: Osteopathic Medicine

## 2017-11-03 DIAGNOSIS — M797 Fibromyalgia: Secondary | ICD-10-CM | POA: Insufficient documentation

## 2017-11-15 ENCOUNTER — Encounter: Payer: Self-pay | Admitting: Osteopathic Medicine

## 2017-11-15 DIAGNOSIS — K219 Gastro-esophageal reflux disease without esophagitis: Secondary | ICD-10-CM

## 2017-11-15 MED ORDER — PANTOPRAZOLE SODIUM 40 MG PO TBEC: 40.0000 mg | DELAYED_RELEASE_TABLET | Freq: Two times a day (BID) | ORAL | 3 refills | Status: DC

## 2017-11-29 ENCOUNTER — Encounter: Payer: Self-pay | Admitting: Family Medicine

## 2017-11-29 DIAGNOSIS — M545 Low back pain, unspecified: Secondary | ICD-10-CM

## 2017-11-29 DIAGNOSIS — G8929 Other chronic pain: Secondary | ICD-10-CM

## 2017-11-30 ENCOUNTER — Encounter: Payer: Self-pay | Admitting: Osteopathic Medicine

## 2017-11-30 ENCOUNTER — Ambulatory Visit (INDEPENDENT_AMBULATORY_CARE_PROVIDER_SITE_OTHER): Payer: No Typology Code available for payment source | Admitting: Osteopathic Medicine

## 2017-11-30 VITALS — BP 125/85 | HR 113 | Temp 98.5°F | Wt 191.8 lb

## 2017-11-30 DIAGNOSIS — M797 Fibromyalgia: Secondary | ICD-10-CM | POA: Diagnosis not present

## 2017-11-30 DIAGNOSIS — G8929 Other chronic pain: Secondary | ICD-10-CM | POA: Diagnosis not present

## 2017-11-30 DIAGNOSIS — M545 Low back pain: Secondary | ICD-10-CM

## 2017-11-30 MED ORDER — AMITRIPTYLINE HCL 100 MG PO TABS: 100.0000 mg | ORAL_TABLET | Freq: Every day | ORAL | 1 refills | Status: DC

## 2017-11-30 MED ORDER — KETOROLAC TROMETHAMINE 60 MG/2ML IM SOLN
60.0000 mg | Freq: Once | INTRAMUSCULAR | Status: AC
  Administered 2017-11-30: 60 mg via INTRAMUSCULAR

## 2017-11-30 MED FILL — AMITRIPTYLINE HCL 100 MG TA: 100 | 90 days supply | Qty: 90 | Fill #0

## 2017-11-30 NOTE — Patient Instructions (Signed)
Plan:  OK to take amitriptyline plus cyclobenzaprine together though this may increase sedation risk   Will get set up for injection again  Follow-up with me as needed! I'm glad the amitriptyline seems to be helping, let's see if we can address the back spasm/pain

## 2017-11-30 NOTE — Progress Notes (Signed)
HPI: Tracey Patterson is a 37 y.o. female who  has a past medical history of Anxiety, Asthma, and History of TMJ disorder.  she presents to [facility] today, 11/30/17,  for chief complaint of:  Follow up pain, fibromyalgia  Recently seen by rheumatologist.  Positive ANA but no strong suspicion for rheumatologic/autoimmune disorder as cause of chronic pain. Chronic pain we discussed was more likely fibromyalgia type syndrome.  We discussed treatments for this.  She has been on multiple SSRIs/SNRI in the past.  Has been on multiple NSAIDs.  She was not getting much in the way of intentional physical activity.  Reports anxiety and sleep issues. Last visit a month ago we started Elavil 50 mg to take 25-100 mg qhs, she is taking 100 mg and doing well.   Today reports back pain. Has been getting injections - following with Dr Georgina Snell for these. Recently ordered repeat, last one was 08/2017.   She states if we'd had this follow up a week ago, she'd be reporting good news! Back pain is an issue. Spasm pain on L and R lower back, no falls or numbness.      Past medical history, surgical history, and family history reviewed.  Current medication list and allergy/intolerance information reviewed.   (See remainder of HPI, ROS, Phys Exam below)    ASSESSMENT/PLAN:   Chronic bilateral low back pain without sciatica - Plan: ketorolac (TORADOL) injection 60 mg  Fibromyalgia     Meds ordered this encounter  Medications  . amitriptyline (ELAVIL) 100 MG tablet    Sig: Take 1 tablet (100 mg total) by mouth at bedtime.    Dispense:  90 tablet    Refill:  1  . ketorolac (TORADOL) injection 60 mg    Patient Instructions  Plan:  OK to take amitriptyline plus cyclobenzaprine together though this may increase sedation risk   Will get set up for injection again  Follow-up with me as needed! I'm glad the amitriptyline seems to be helping, let's see if we can address  the back spasm/pain      Follow-up plan: Return if symptoms worsen or fail to improve.                 ############################################ ############################################ ############################################ ############################################    Outpatient Encounter Medications as of 11/30/2017  Medication Sig  . Acetaminophen (TYLENOL ARTHRITIS PAIN PO) Take by mouth.  Marland Kitchen albuterol (PROVENTIL HFA;VENTOLIN HFA) 108 (90 Base) MCG/ACT inhaler Inhale 2 puffs into the lungs every 4 (four) hours as needed for wheezing (cough, shortness of breath or wheezing.).  Marland Kitchen amitriptyline (ELAVIL) 50 MG tablet Take 0.5-2 tablets (25-100 mg total) by mouth at bedtime. Increase by 0.5 tablets every 2-3 days  . buPROPion (WELLBUTRIN XL) 300 MG 24 hr tablet Take 1 tablet (300 mg total) by mouth daily.  . cetirizine (ZYRTEC) 10 MG tablet Take 1 tablet (10 mg total) by mouth daily.  . clonazePAM (KLONOPIN) 0.5 MG tablet Half to full tab daily as needed for anxiety.  . cyclobenzaprine (FLEXERIL) 5 MG tablet Take 1-2 tablets (5-10 mg total) by mouth 3 (three) times daily as needed for muscle spasms.  . diclofenac (VOLTAREN) 75 MG EC tablet Take 1 tablet (75 mg total) by mouth 2 (two) times daily as needed.  . eszopiclone (LUNESTA) 2 MG TABS tablet Take 0.5-1.5 tablets (1-3 mg total) by mouth at bedtime as needed for sleep. Take immediately before bedtime. Use lowest effective dose.  . fluticasone (FLONASE)  50 MCG/ACT nasal spray Place 2 sprays into both nostrils daily.  . IBUPROFEN PO Take 800 mg by mouth as needed.   . pantoprazole (PROTONIX) 40 MG tablet Take 1 tablet (40 mg total) by mouth 2 (two) times daily.  . SUMAtriptan (IMITREX) 50 MG tablet Take 1-2 tablets (50-100 mg total) by mouth once as needed for migraine. May repeat in 2 hours if headache persists or recurs. Limit use to 200 mg (4 tablets) per 24 hours, use no more than 1-2 times per week  .  traMADol (ULTRAM) 50 MG tablet Take 1 tablet (50 mg total) by mouth every 8 (eight) hours as needed (pain or cough).   No facility-administered encounter medications on file as of 11/30/2017.    Allergies  Allergen Reactions  . Sulfa Antibiotics Hives  . Zoloft [Sertraline Hcl] Other (See Comments)    Hair loss  . Crab [Shellfish Allergy] Other (See Comments)    Allergist advises against eating crab.  . Other Rash    Dermabond, steri strips      Review of Systems:  Constitutional: No recent illness  HEENT: No headache, no vision change  Cardiac: No  chest pain, No  pressure, No palpitations  Respiratory:  No  shortness of breath. No  Cough  Gastrointestinal: No abdominal pain, no change on bowel habits, +constipation  Musculoskeletal: +myalgia/arthralgia  Skin: No  Rash  Hem/Onc: No  easy bruising/bleeding  Neurologic: No  weakness, No  Dizziness  Psychiatric: +concerns with depression, +concerns with anxiety  Exam:  BP 125/85 (BP Location: Left Arm, Patient Position: Sitting, Cuff Size: Normal)   Pulse (!) 113   Temp 98.5 F (36.9 C) (Oral)   Wt 191 lb 12.8 oz (87 kg)   BMI 31.42 kg/m   Constitutional: VS see above. General Appearance: alert, well-developed, well-nourished, NAD  Eyes: Normal lids and conjunctive, non-icteric sclera  Ears, Nose, Mouth, Throat: MMM, Normal external inspection ears/nares/mouth/lips/gums.  Neck: No masses, trachea midline.   Respiratory: Normal respiratory effort. no wheeze, no rhonchi, no rales  Cardiovascular: S1/S2 normal, no murmur, no rub/gallop auscultated. RRR.   Musculoskeletal: Gait normal. Symmetric and independent movement of all extremities. (+)tendernedd L and R lower back, no midline tenderness, neg SLR   Neurological: Normal balance/coordination. No tremor.  Skin: warm, dry, intact.   Psychiatric: Normal judgment/insight. Normal mood and affect. Oriented x3.   Visit summary with medication list and  pertinent instructions was printed for patient to review, advised to alert [place] if any changes needed. All questions at time of visit were answered - patient instructed to contact office with any additional concerns. ER/RTC precautions were reviewed with the patient and understanding verbalized.   Follow-up plan: Return if symptoms worsen or fail to improve.  Note: Total time spent 25 minutes, greater than 50% of the visit was spent face-to-face counseling and coordinating care for the following: The primary encounter diagnosis was Chronic bilateral low back pain without sciatica. A diagnosis of Fibromyalgia was also pertinent to this visit.Marland Kitchen  Please note: voice recognition software was used to produce this document, and typos may escape review. Please contact Dr. Sheppard Coil for any needed clarifications.

## 2017-12-01 MED FILL — buPROPion HCL ER (XL) 300 M: 300 | 90 days supply | Qty: 90 | Fill #1

## 2017-12-02 ENCOUNTER — Encounter: Payer: Self-pay | Admitting: Family Medicine

## 2017-12-02 MED ORDER — PREDNISONE 50 MG PO TABS: 50.0000 mg | ORAL_TABLET | Freq: Every day | ORAL | 0 refills | Status: DC

## 2017-12-19 ENCOUNTER — Ambulatory Visit
Admission: RE | Admit: 2017-12-19 | Discharge: 2017-12-19 | Disposition: A | Discharge status: Home / Self Care | Payer: No Typology Code available for payment source | Source: Ambulatory Visit | Attending: Family Medicine | Admitting: Family Medicine

## 2017-12-19 MED ORDER — IOPAMIDOL (ISOVUE-M 200) INJECTION 41%
1.0000 mL | Freq: Once | INTRAMUSCULAR | Status: AC
  Administered 2017-12-19: 1 mL via EPIDURAL

## 2017-12-19 MED ORDER — METHYLPREDNISOLONE ACETATE 40 MG/ML INJ SUSP (RADIOLOG
120.0000 mg | Freq: Once | INTRAMUSCULAR | Status: AC
  Administered 2017-12-19: 120 mg via EPIDURAL

## 2017-12-19 NOTE — Discharge Instructions (Signed)

## 2017-12-27 MED FILL — PANTOPRAZOLE SOD DR 40 MG T: 40 | 90 days supply | Qty: 180 | Fill #0

## 2017-12-30 ENCOUNTER — Encounter: Payer: Self-pay | Admitting: Osteopathic Medicine

## 2017-12-30 DIAGNOSIS — R768 Other specified abnormal immunological findings in serum: Secondary | ICD-10-CM | POA: Insufficient documentation

## 2018-02-24 ENCOUNTER — Telehealth: Payer: No Typology Code available for payment source | Admitting: Nurse Practitioner

## 2018-02-24 DIAGNOSIS — R059 Cough, unspecified: Secondary | ICD-10-CM

## 2018-02-24 DIAGNOSIS — R05 Cough: Secondary | ICD-10-CM

## 2018-02-24 MED ORDER — BENZONATATE 100 MG PO CAPS: 100.0000 mg | ORAL_CAPSULE | Freq: Three times a day (TID) | ORAL | 0 refills | Status: DC | PRN

## 2018-02-24 MED FILL — BENZONATATE 100 MG CAP: 100 | 7 days supply | Qty: 20 | Fill #0

## 2018-02-24 NOTE — Progress Notes (Signed)

## 2018-02-27 ENCOUNTER — Encounter: Payer: Self-pay | Admitting: Family Medicine

## 2018-02-27 DIAGNOSIS — G8929 Other chronic pain: Secondary | ICD-10-CM

## 2018-02-27 DIAGNOSIS — M545 Low back pain: Principal | ICD-10-CM

## 2018-03-01 ENCOUNTER — Other Ambulatory Visit: Payer: Self-pay | Admitting: Family Medicine

## 2018-03-01 DIAGNOSIS — M545 Low back pain: Principal | ICD-10-CM

## 2018-03-01 DIAGNOSIS — G8929 Other chronic pain: Secondary | ICD-10-CM

## 2018-03-03 ENCOUNTER — Encounter: Payer: Self-pay | Admitting: Osteopathic Medicine

## 2018-03-03 ENCOUNTER — Ambulatory Visit (INDEPENDENT_AMBULATORY_CARE_PROVIDER_SITE_OTHER): Payer: No Typology Code available for payment source

## 2018-03-03 ENCOUNTER — Ambulatory Visit (INDEPENDENT_AMBULATORY_CARE_PROVIDER_SITE_OTHER): Payer: No Typology Code available for payment source | Admitting: Osteopathic Medicine

## 2018-03-03 VITALS — BP 127/78 | HR 92 | Temp 97.7°F | Wt 198.7 lb

## 2018-03-03 DIAGNOSIS — J45909 Unspecified asthma, uncomplicated: Secondary | ICD-10-CM

## 2018-03-03 DIAGNOSIS — R05 Cough: Secondary | ICD-10-CM

## 2018-03-03 DIAGNOSIS — R059 Cough, unspecified: Secondary | ICD-10-CM

## 2018-03-03 DIAGNOSIS — R062 Wheezing: Secondary | ICD-10-CM | POA: Diagnosis not present

## 2018-03-03 DIAGNOSIS — R0602 Shortness of breath: Secondary | ICD-10-CM | POA: Diagnosis not present

## 2018-03-03 MED ORDER — AZITHROMYCIN 250 MG PO TABS: ORAL_TABLET | ORAL | 0 refills | Status: DC

## 2018-03-03 MED ORDER — GUAIFENESIN-CODEINE 100-10 MG/5ML PO SYRP: 5.0000 mL | ORAL_SOLUTION | Freq: Three times a day (TID) | ORAL | 0 refills | Status: DC | PRN

## 2018-03-03 MED ORDER — GUAIFENESIN-CODEINE 100-10 MG/5ML PO SYRP: 5.0000 mL | ORAL_SOLUTION | Freq: Four times a day (QID) | ORAL | 0 refills | Status: DC | PRN

## 2018-03-03 MED ORDER — PREDNISONE 50 MG PO TABS: 50.0000 mg | ORAL_TABLET | Freq: Every day | ORAL | 0 refills | Status: DC

## 2018-03-03 MED FILL — GUAIATUSSIN AC LIQUID: 100-10 | 5 days supply | Qty: 180 | Fill #0

## 2018-03-03 MED FILL — AZITHROMYCIN 250 MG TABLET: 250 | 5 days supply | Qty: 6 | Fill #0

## 2018-03-03 MED FILL — predniSONE 50 MG TABS: 50 | 5 days supply | Qty: 5 | Fill #0

## 2018-03-03 NOTE — Progress Notes (Signed)
HPI: Tracey Patterson is a 37 y.o. female who  has a past medical history of Anxiety, Asthma, and History of TMJ disorder.  she presents to [facility] today, 03/03/18,  for chief complaint of: Sick - respiratory   Cough x10 days, e-visit last week Rx tessalon perles which didn't really help, cough worse at night, no fever. (+)wheezing, hx mild intermittent asthma. Hasn't been using inhaler much.      Past medical, surgical, social and family history reviewed and updated as necessary.   Current medication list and allergy/intolerance information reviewed:    Current Outpatient Medications  Medication Sig Dispense Refill  . Acetaminophen (TYLENOL ARTHRITIS PAIN PO) Take by mouth.    Marland Kitchen albuterol (PROVENTIL HFA;VENTOLIN HFA) 108 (90 Base) MCG/ACT inhaler Inhale 2 puffs into the lungs every 4 (four) hours as needed for wheezing (cough, shortness of breath or wheezing.). 1 Inhaler 1  . amitriptyline (ELAVIL) 100 MG tablet Take 1 tablet (100 mg total) by mouth at bedtime. 90 tablet 1  . benzonatate (TESSALON PERLES) 100 MG capsule Take 1 capsule (100 mg total) by mouth 3 (three) times daily as needed for cough. 20 capsule 0  . buPROPion (WELLBUTRIN XL) 300 MG 24 hr tablet Take 1 tablet (300 mg total) by mouth daily. 90 tablet 3  . cetirizine (ZYRTEC) 10 MG tablet Take 1 tablet (10 mg total) by mouth daily. 30 tablet 3  . clonazePAM (KLONOPIN) 0.5 MG tablet Half to full tab daily as needed for anxiety. 90 tablet 0  . cyclobenzaprine (FLEXERIL) 5 MG tablet Take 1-2 tablets (5-10 mg total) by mouth 3 (three) times daily as needed for muscle spasms. 90 tablet 1  . diclofenac (VOLTAREN) 75 MG EC tablet Take 1 tablet (75 mg total) by mouth 2 (two) times daily as needed. 60 tablet 3  . fluticasone (FLONASE) 50 MCG/ACT nasal spray Place 2 sprays into both nostrils daily. 16 g 6  . IBUPROFEN PO Take 800 mg by mouth as needed.     . pantoprazole (PROTONIX) 40 MG tablet  Take 1 tablet (40 mg total) by mouth 2 (two) times daily. 180 tablet 3  . predniSONE (DELTASONE) 50 MG tablet Take 1 tablet (50 mg total) by mouth daily. 5 tablet 0  . SUMAtriptan (IMITREX) 50 MG tablet Take 1-2 tablets (50-100 mg total) by mouth once as needed for migraine. May repeat in 2 hours if headache persists or recurs. Limit use to 200 mg (4 tablets) per 24 hours, use no more than 1-2 times per week 30 tablet 0  . traMADol (ULTRAM) 50 MG tablet Take 1 tablet (50 mg total) by mouth every 8 (eight) hours as needed (pain or cough). 20 tablet 0   No current facility-administered medications for this visit.     Allergies  Allergen Reactions  . Sulfa Antibiotics Hives  . Zoloft [Sertraline Hcl] Other (See Comments)    Hair loss  . Crab [Shellfish Allergy] Other (See Comments)    Allergist advises against eating crab.  . Other Rash    Dermabond, steri strips      Review of Systems:  Constitutional:  No  fever, no chills, +recent illness, No unintentional weight changes. +significant fatigue.   HEENT: No  headache, no vision change, no hearing change, No sore throat, No  sinus pressure  Cardiac: No  chest pain, No  pressure, No palpitations  Respiratory:  No  shortness of breath. +Cough  Gastrointestinal: No  abdominal pain, No  nausea, No  vomiting,  No  blood in stool, No  diarrhea  Musculoskeletal: No new myalgia/arthralgia  Skin: No  Rash  Neurologic: No  weakness, No  dizziness  Exam:  BP 127/78   Pulse 92   Temp 97.7 F (36.5 C) (Oral)   Wt 198 lb 11.2 oz (90.1 kg)   SpO2 100%   BMI 32.55 kg/m   Constitutional: VS see above. General Appearance: alert, well-developed, well-nourished, NAD  Eyes: Normal lids and conjunctive, non-icteric sclera  Ears, Nose, Mouth, Throat: MMM, Normal external inspection ears/nares/mouth/lips/gums. TM normal bilaterally. Pharynx/tonsils +erythema, no exudate. Nasal mucosa normal.   Neck: No masses, trachea midline. No  tenderness/mass appreciated. No lymphadenopathy  Respiratory: Normal respiratory effort. +faint wheeze which then cleared on repeat auscultation of LML area, no rhonchi, no rales  Cardiovascular: S1/S2 normal, no murmur, no rub/gallop auscultated. RRR. No lower extremity edema.   Gastrointestinal: Nontender, no masses. Bowel sounds normal.  Musculoskeletal: Gait normal.   Neurological: Normal balance/coordination. No tremor.   Skin: warm, dry, intact.   Psychiatric: Normal judgment/insight. Normal mood and affect.    CXR on personal review Cardiomediastinal silhouette/heart size: normal Obvious bony abnormality: none Infiltrate: none Mass or other opacity: none Atelectasis: none Diaphragms: normal Lateral view: normal Pt counseled that radiologist will review the images as well, our office will call if the formal read reveals any significant findings other than what has been noted above.       ASSESSMENT/PLAN: The primary encounter diagnosis was Chronic asthma without complication, unspecified asthma severity, unspecified whether persistent. A diagnosis of Cough was also pertinent to this visit.   Declined breathing treatment in office  Advised use inhaler at home   Meds ordered this encounter  Medications  . DISCONTD: guaiFENesin-codeine (ROBITUSSIN AC) 100-10 MG/5ML syrup    Sig: Take 5 mLs by mouth 3 (three) times daily as needed for cough.    Dispense:  75 mL    Refill:  0  . azithromycin (ZITHROMAX) 250 MG tablet    Sig: 2 tabs po on Day 1, then 1 tab daily Days 2 - 5    Dispense:  6 tablet    Refill:  0  . predniSONE (DELTASONE) 50 MG tablet    Sig: Take 1 tablet (50 mg total) by mouth daily.    Dispense:  5 tablet    Refill:  0  . guaiFENesin-codeine (ROBITUSSIN AC) 100-10 MG/5ML syrup    Sig: Take 5-10 mLs by mouth 4 (four) times daily as needed for cough.    Dispense:  180 mL    Refill:  0    Orders Placed This Encounter  Procedures  . DG Chest 2  View    Patient Instructions  Medications & Home Remedies for Respiratory Illness   Aches/Pains, Fever, Headache OTC Acetaminophen (Tylenol) 500 mg tablets - take max 2 tablets (1000 mg) every 6 hours (4 times per day)  OTC Ibuprofen (Motrin) 200 mg tablets - take max 4 tablets (800 mg) every 6 hours*   Sinus Congestion OTC Nasal Saline if desired to rinse OTC Oxymetolazone (Afrin, others) sparing use due to rebound congestion, NEVER use in kids OTC Phenylephrine (Sudafed) 10 mg tablets every 4 hours (or the 12-hour formulation)* OTC Diphenhydramine (Benadryl) 25 mg tablets - take max 2 tablets every 4 hours   Cough & Sore Throat Prescription cough pills or syrups as directed OTC Dextromethorphan (Robitussin, others) - cough suppressant OTC Guaifenesin (Robitussin, Mucinex, others) - expectorant (helps cough up  mucus) (DO NOT take with Rx cough syrup - duplicates the Guaifenesin)  (Dextromethorphan and Guaifenesin also come in a combination tablet/syrup) OTC Lozenges w/ Benzocaine + Menthol (Cepacol) Honey - as much as you want! Teas which "coat the throat" - look for ingredients Elm Bark, Licorice Root, Marshmallow Root   Other Prescription Oral Steroids to decrease inflammation  Prescription Antibiotics if these are necessary for bacterial infection - take ALL, even if you're feeling better  Prescription Inhaler to help break up cough and open airways  OTC Zinc Lozenges within 24 hours of symptoms onset - mixed evidence this shortens the duration of the common cold Don't waste your money on Vitamin C or Echinacea in acute illness - it's already too late!    *Caution in patients with high blood pressure         Visit summary with medication list and pertinent instructions was printed for patient to review. All questions at time of visit were answered - patient instructed to contact office with any additional concerns or updates. ER/RTC precautions were reviewed with the  patient.   Follow-up plan: Return if symptoms worsen or fail to improve.    Please note: voice recognition software was used to produce this document, and typos may escape review. Please contact Dr. Sheppard Coil for any needed clarifications.

## 2018-03-03 NOTE — Patient Instructions (Addendum)
Medications & Home Remedies for Respiratory Illness   Aches/Pains, Fever, Headache OTC Acetaminophen (Tylenol) 500 mg tablets - take max 2 tablets (1000 mg) every 6 hours (4 times per day)  OTC Ibuprofen (Motrin) 200 mg tablets - take max 4 tablets (800 mg) every 6 hours*   Sinus Congestion OTC Nasal Saline if desired to rinse OTC Oxymetolazone (Afrin, others) sparing use due to rebound congestion, NEVER use in kids OTC Phenylephrine (Sudafed) 10 mg tablets every 4 hours (or the 12-hour formulation)* OTC Diphenhydramine (Benadryl) 25 mg tablets - take max 2 tablets every 4 hours   Cough & Sore Throat Prescription cough pills or syrups as directed OTC Dextromethorphan (Robitussin, others) - cough suppressant OTC Guaifenesin (Robitussin, Mucinex, others) - expectorant (helps cough up mucus) (DO NOT take with Rx cough syrup - duplicates the Guaifenesin)  (Dextromethorphan and Guaifenesin also come in a combination tablet/syrup) OTC Lozenges w/ Benzocaine + Menthol (Cepacol) Honey - as much as you want! Teas which "coat the throat" - look for ingredients Elm Bark, Licorice Root, Marshmallow Root   Other Prescription Oral Steroids to decrease inflammation  Prescription Antibiotics if these are necessary for bacterial infection - take ALL, even if you're feeling better  Prescription Inhaler to help break up cough and open airways  OTC Zinc Lozenges within 24 hours of symptoms onset - mixed evidence this shortens the duration of the common cold Don't waste your money on Vitamin C or Echinacea in acute illness - it's already too late!    *Caution in patients with high blood pressure

## 2018-03-07 MED FILL — buPROPion HCL ER (XL) 300 M: 300 | 90 days supply | Qty: 90 | Fill #2

## 2018-03-07 MED FILL — AMITRIPTYLINE HCL 100 MG TA: 100 | 90 days supply | Qty: 90 | Fill #1

## 2018-03-15 ENCOUNTER — Encounter: Payer: Self-pay | Admitting: Osteopathic Medicine

## 2018-03-15 DIAGNOSIS — B9689 Other specified bacterial agents as the cause of diseases classified elsewhere: Secondary | ICD-10-CM

## 2018-03-15 DIAGNOSIS — J329 Chronic sinusitis, unspecified: Principal | ICD-10-CM

## 2018-03-16 MED ORDER — ALBUTEROL SULFATE 108 (90 BASE) MCG/ACT IN AEPB: 1.0000 | INHALATION_SPRAY | RESPIRATORY_TRACT | 1 refills | Status: DC | PRN

## 2018-03-16 MED ORDER — GUAIFENESIN-CODEINE 100-10 MG/5ML PO SYRP: 5.0000 mL | ORAL_SOLUTION | Freq: Four times a day (QID) | ORAL | 0 refills | Status: DC | PRN

## 2018-03-20 ENCOUNTER — Ambulatory Visit
Admission: RE | Admit: 2018-03-20 | Discharge: 2018-03-20 | Disposition: A | Discharge status: Home / Self Care | Payer: No Typology Code available for payment source | Source: Ambulatory Visit | Attending: Family Medicine | Admitting: Family Medicine

## 2018-03-20 DIAGNOSIS — M545 Low back pain: Principal | ICD-10-CM

## 2018-03-20 DIAGNOSIS — G8929 Other chronic pain: Secondary | ICD-10-CM

## 2018-03-31 ENCOUNTER — Ambulatory Visit
Admission: RE | Admit: 2018-03-31 | Discharge: 2018-03-31 | Disposition: A | Discharge status: Home / Self Care | Payer: No Typology Code available for payment source | Source: Ambulatory Visit | Attending: Family Medicine | Admitting: Family Medicine

## 2018-03-31 ENCOUNTER — Other Ambulatory Visit: Payer: Self-pay | Admitting: Family Medicine

## 2018-03-31 DIAGNOSIS — G8929 Other chronic pain: Secondary | ICD-10-CM

## 2018-03-31 DIAGNOSIS — M545 Low back pain, unspecified: Secondary | ICD-10-CM

## 2018-03-31 MED ORDER — KETOROLAC TROMETHAMINE 30 MG/ML IJ SOLN
30.0000 mg | Freq: Once | INTRAMUSCULAR | Status: AC
  Administered 2018-03-31: 30 mg via INTRAVENOUS

## 2018-03-31 MED ORDER — METHYLPREDNISOLONE ACETATE 40 MG/ML INJ SUSP (RADIOLOG
120.0000 mg | Freq: Once | INTRAMUSCULAR | Status: AC
  Administered 2018-03-31: 120 mg via INTRALESIONAL

## 2018-03-31 MED ORDER — FENTANYL CITRATE (PF) 100 MCG/2ML IJ SOLN
25.0000 ug | INTRAMUSCULAR | Status: DC | PRN
  Administered 2018-03-31: 50 ug via INTRAVENOUS

## 2018-03-31 MED ORDER — SODIUM CHLORIDE 0.9 % IV SOLN
Freq: Once | INTRAVENOUS | Status: AC
  Administered 2018-03-31: 08:00:00 via INTRAVENOUS

## 2018-03-31 MED ORDER — MIDAZOLAM HCL 2 MG/2ML IJ SOLN
1.0000 mg | INTRAMUSCULAR | Status: DC | PRN
  Administered 2018-03-31: 2 mg via INTRAVENOUS

## 2018-03-31 NOTE — Discharge Instructions (Signed)
Radio Frequency Ablation Post Procedure Discharge Instructions  1. May resume a regular diet and any medications that you routinely take (including pain medications). 2. No driving day of procedure. 3. Upon discharge go home and rest for at least 4 hours.  May use an ice pack as needed to injection sites on back. 4. Remove bandades later, today.    Please contact our office at [phone] for the following symptoms:   Fever greater than 100 degrees  Increased swelling, pain, or redness at injection site.   Thank you for visiting [facility].

## 2018-03-31 NOTE — Progress Notes (Signed)
Pt recovering in nurses station. Pt is currently awake, alert and speaking in complete sentences. Pt has no complaints to offer at this time. Dr. Jola Baptist at bedside speaking with patient. Will continue to monitor.

## 2018-04-10 MED FILL — CYCLOBENZAPRINE HCL 5 MG TA: 5 | 15 days supply | Qty: 90 | Fill #1

## 2018-05-24 MED FILL — PANTOPRAZOLE SOD DR 40 MG T: 40 | 90 days supply | Qty: 180 | Fill #1

## 2018-06-01 ENCOUNTER — Encounter: Payer: Self-pay | Admitting: Osteopathic Medicine

## 2018-06-13 ENCOUNTER — Encounter: Payer: No Typology Code available for payment source | Admitting: Osteopathic Medicine

## 2018-06-13 MED FILL — buPROPion HCL ER (XL) 300 M: 300 | 90 days supply | Qty: 90 | Fill #3

## 2018-06-16 MED FILL — PROAIR RESPICLICK INHAL PWD: 108 (90 BAS | 25 days supply | Qty: 1 | Fill #0

## 2018-06-20 ENCOUNTER — Encounter: Payer: Self-pay | Admitting: Osteopathic Medicine

## 2018-06-20 MED ORDER — AMITRIPTYLINE HCL 100 MG PO TABS: 100.0000 mg | ORAL_TABLET | Freq: Every day | ORAL | 0 refills | Status: DC

## 2018-06-20 MED FILL — AMITRIPTYLINE HCL 100 MG TA: 100 | 90 days supply | Qty: 90 | Fill #0

## 2018-06-21 ENCOUNTER — Encounter: Payer: No Typology Code available for payment source | Admitting: Osteopathic Medicine

## 2018-07-24 ENCOUNTER — Encounter: Payer: No Typology Code available for payment source | Admitting: Osteopathic Medicine

## 2018-08-28 ENCOUNTER — Other Ambulatory Visit: Payer: Self-pay | Admitting: Osteopathic Medicine

## 2018-08-28 DIAGNOSIS — F411 Generalized anxiety disorder: Secondary | ICD-10-CM

## 2018-08-28 MED FILL — buPROPion HCL ER (XL) 300 M: 300 | 90 days supply | Qty: 90 | Fill #0

## 2018-08-28 NOTE — Telephone Encounter (Signed)
Routing to PCP for medication refill review.

## 2018-09-25 ENCOUNTER — Ambulatory Visit (INDEPENDENT_AMBULATORY_CARE_PROVIDER_SITE_OTHER): Payer: No Typology Code available for payment source | Admitting: Family Medicine

## 2018-09-25 VITALS — BP 119/83 | HR 109 | Temp 98.1°F | Wt 203.0 lb

## 2018-09-25 DIAGNOSIS — S39012A Strain of muscle, fascia and tendon of lower back, initial encounter: Secondary | ICD-10-CM | POA: Diagnosis not present

## 2018-09-25 MED ORDER — BACLOFEN 10 MG PO TABS: 10.0000 mg | ORAL_TABLET | Freq: Three times a day (TID) | ORAL | 1 refills | Status: DC | PRN

## 2018-09-25 MED ORDER — TRAMADOL HCL 50 MG PO TABS: 50.0000 mg | ORAL_TABLET | Freq: Three times a day (TID) | ORAL | 0 refills | Status: DC | PRN

## 2018-09-25 MED FILL — BACLOFEN 10 MG TABS: 10 | 15 days supply | Qty: 90 | Fill #0

## 2018-09-25 MED FILL — traMADol HCL 50 MG TABS: 50 | 5 days supply | Qty: 15 | Fill #0

## 2018-09-25 NOTE — Progress Notes (Signed)
Tracey Patterson is a 38 y.o. female who presents to [facility] today for back pain.   Tracey Patterson was evaluated and treated for back pain last year.  This culminated in right facet nerve ablation in January.  This worked pretty well to control pain until about April.  Starting April the pain returned and worsened a lot about a week ago.  She denies any injury.  She notes that the ablation was uncomfortable and she would wish to avoid it if possible.  She took a leftover tramadol and some muscle relaxer in the day which helped some to control her pain.  She notes significant pain in the right low back.  She denies any radiating pain weakness or numbness distally.  No bowel or bladder dysfunction.  Pain is severe at times and worse with activity.      ROS:  As above  Exam:  BP 119/83   Pulse (!) 109   Temp 98.1 F (36.7 C) (Oral)   Wt 203 lb (92.1 kg)   BMI 33.26 kg/m   Wt Readings from Last 5 Encounters:  09/25/18 203 lb (92.1 kg)  03/03/18 198 lb 11.2 oz (90.1 kg)  11/30/17 191 lb 12.8 oz (87 kg)  11/02/17 194 lb 1.6 oz (88 kg)  09/01/17 189 lb (85.7 kg)   General: Well Developed, well nourished, and in no acute distress.  Neuro/Psych: Alert and oriented x3, extra-ocular muscles intact, able to move all 4 extremities, sensation grossly intact. Skin: Warm and dry, no rashes noted.  Respiratory: Not using accessory muscles, speaking in full sentences, trachea midline.  Cardiovascular: Pulses palpable, no extremity edema. Abdomen: Does not appear distended. MSK: L-spine: Nontender to spinal midline.  Tender palpation right lumbar paraspinal musculature.  Paraspinal musculature appears to be in spasm and is rigid to palpation. Significantly decreased lumbar motion with pain. Lower extremity strength reflexes and sensation are equal normal throughout. Antalgic gait present .    Lab and Radiology Results L-spine imaging reviewed  including facet ablations lumbar spine.    Assessment and Plan: 38 y.o. female with worsening right low back pain.  Patient is now 6 months out from ablation.  Is not surprising that her pain has returned however she appears to have acute onset of worsening pain which is likely myofascial spasm and dysfunction.  Her exam shows rigidity of lumbar musculature and tender to touch.  Plan to proceed with trial of physical therapy as this is likely provide some benefit.  We will also switch up muscle relaxers and use baclofen.  Limited tramadol for pain control.  Recheck if not improving.   PDMP reviewed during this encounter. Orders Placed This Encounter  Procedures  . Ambulatory referral to Physical Therapy    Referral Priority:   Routine    Referral Type:   Physical Medicine    Referral Reason:   Specialty Services Required    Requested Specialty:   Physical Therapy   Meds ordered this encounter  Medications  . baclofen (LIORESAL) 10 MG tablet    Sig: Take 1-2 tablets (10-20 mg total) by mouth 3 (three) times daily as needed for muscle spasms.    Dispense:  90 each    Refill:  1  . traMADol (ULTRAM) 50 MG tablet    Sig: Take 1 tablet (50 mg total) by mouth every 8 (eight) hours as needed for severe pain.    Dispense:  15 tablet    Refill:  0  Historical information moved to improve visibility of documentation.  Past Medical History:  Diagnosis Date  . Anxiety   . Asthma   . History of TMJ disorder    Past Surgical History:  Procedure Laterality Date  . BACK SURGERY  December 2015  . KNEE ARTHROSCOPY  1998   left  . LAPAROSCOPIC TUBAL LIGATION  02/28/2012   Procedure: LAPAROSCOPIC TUBAL LIGATION;  Surgeon: Cyril Mourning, MD;  Location: [facility];  Service: Gynecology;  Laterality: Bilateral;  . TONSILLECTOMY     Social History   Tobacco Use  . Smoking status: Never Smoker  . Smokeless tobacco: Never Used  Substance Use Topics  . Alcohol use: Yes   family history  includes Depression in her mother; Hypertension in her father and mother.  Medications: Current Outpatient Medications  Medication Sig Dispense Refill  . Acetaminophen (TYLENOL ARTHRITIS PAIN PO) Take by mouth.    . Albuterol Sulfate (PROAIR RESPICLICK) 357 (90 Base) MCG/ACT AEPB Inhale 1-2 puffs into the lungs every 4 (four) hours as needed (cough/wheeze). 1 each 1  . amitriptyline (ELAVIL) 100 MG tablet Take 1 tablet (100 mg total) by mouth at bedtime. 90 tablet 0  . baclofen (LIORESAL) 10 MG tablet Take 1-2 tablets (10-20 mg total) by mouth 3 (three) times daily as needed for muscle spasms. 90 each 1  . buPROPion (WELLBUTRIN XL) 300 MG 24 hr tablet TAKE 1 TABLET (300 MG TOTAL) BY MOUTH DAILY. 90 tablet 3  . cetirizine (ZYRTEC) 10 MG tablet Take 1 tablet (10 mg total) by mouth daily. 30 tablet 3  . clonazePAM (KLONOPIN) 0.5 MG tablet Half to full tab daily as needed for anxiety. 90 tablet 0  . fluticasone (FLONASE) 50 MCG/ACT nasal spray Place 2 sprays into both nostrils daily. 16 g 6  . IBUPROFEN PO Take 800 mg by mouth as needed.     . pantoprazole (PROTONIX) 40 MG tablet Take 1 tablet (40 mg total) by mouth 2 (two) times daily. 180 tablet 3  . predniSONE (DELTASONE) 50 MG tablet Take 1 tablet (50 mg total) by mouth daily. 5 tablet 0  . SUMAtriptan (IMITREX) 50 MG tablet Take 1-2 tablets (50-100 mg total) by mouth once as needed for migraine. May repeat in 2 hours if headache persists or recurs. Limit use to 200 mg (4 tablets) per 24 hours, use no more than 1-2 times per week 30 tablet 0  . traMADol (ULTRAM) 50 MG tablet Take 1 tablet (50 mg total) by mouth every 8 (eight) hours as needed for severe pain. 15 tablet 0   No current facility-administered medications for this visit.    Allergies  Allergen Reactions  . Sulfa Antibiotics Hives  . Zoloft [Sertraline Hcl] Other (See Comments)    Hair loss  . Crab [Shellfish Allergy] Other (See Comments)    Allergist advises against eating  crab.  . Other Rash    Dermabond, steri strips      Discussed warning signs or symptoms. Please see discharge instructions. Patient expresses understanding.

## 2018-09-25 NOTE — Patient Instructions (Signed)
Thank you for coming in today. Attend PT.  Use medicine sparingly especially tramadol.  Keep me updated.  IF no improvement with PT we can repeat ablation.     Lumbosacral Strain Lumbosacral strain is an injury that causes pain in the lower back (lumbosacral spine). This injury usually occurs from overstretching the muscles or ligaments along your spine. A strain can affect one or more muscles or cord-like tissues that connect bones to other bones (ligaments). What are the causes? This condition may be caused by:  A hard, direct hit (blow) to the back.  Excessive stretching of the lower back muscles. This may result from: ? A fall. ? Lifting something heavy. ? Repetitive movements such as bending or crouching. What increases the risk? The following factors may increase your risk of getting this condition:  Participating in sports or activities that involve: ? A sudden twist of the back. ? Pushing or pulling motions.  Being overweight or obese.  Having poor strength and flexibility, especially tight hamstrings or weak muscles in the back or abdomen.  Having too much of a curve in the lower back.  Having a pelvis that is tilted forward. What are the signs or symptoms? The main symptom of this condition is pain in the lower back, at the site of the strain. Pain may extend (radiate) down one or both legs. How is this diagnosed? This condition is diagnosed based on:  Your symptoms.  Your medical history.  A physical exam. ? Your health care provider may push on certain areas of your back to determine the source of your pain. ? You may be asked to bend forward, backward, and side to side to assess the severity of your pain and your range of motion.  Imaging tests, such as: ? X-rays. ? MRI.  How is this treated? Treatment for this condition may include:  Putting heat and cold on the affected area.  Medicines to help relieve pain and relax your muscles (muscle  relaxants).  NSAIDs to help reduce swelling and discomfort. When your symptoms improve, it is important to gradually return to your normal routine as soon as possible to reduce pain, avoid stiffness, and avoid loss of muscle strength. Generally, symptoms should improve within 6 weeks of treatment. However, recovery time varies. Follow these instructions at home: Managing pain, stiffness, and swelling   If directed, put ice on the injured area during the first 24 hours after your strain. ? Put ice in a plastic bag. ? Place a towel between your skin and the bag. ? Leave the ice on for 20 minutes, 2-3 times a day.  If directed, put heat on the affected area as often as told by your health care provider. Use the heat source that your health care provider recommends, such as a moist heat pack or a heating pad. ? Place a towel between your skin and the heat source. ? Leave the heat on for 20-30 minutes. ? Remove the heat if your skin turns bright red. This is especially important if you are unable to feel pain, heat, or cold. You may have a greater risk of getting burned. Activity  Rest and return to your normal activities as told by your health care provider. Ask your health care provider what activities are safe for you.  Avoid activities that take a lot of energy for as long as told by your health care provider. General instructions  Take over-the-counter and prescription medicines only as told by your health care  provider.  Donot drive or use heavy machinery while taking prescription pain medicine.  Do not use any products that contain nicotine or tobacco, such as cigarettes and e-cigarettes. If you need help quitting, ask your health care provider.  Keep all follow-up visits as told by your health care provider. This is important. How is this prevented?  Use correct form when playing sports and lifting heavy objects.  Use good posture when sitting and standing.  Maintain a  healthy weight.  Sleep on a mattress with medium firmness to support your back.  Be safe and responsible while being active to avoid falls.  Do at least 150 minutes of moderate-intensity exercise each week, such as brisk walking or water aerobics. Try a form of exercise that takes stress off your back, such as swimming or stationary cycling.  Maintain physical fitness, including: ? Strength. ? Flexibility. ? Cardiovascular fitness. ? Endurance. Contact a health care provider if:  Your back pain does not improve after 6 weeks of treatment.  Your symptoms get worse. Get help right away if:  Your back pain is severe.  You cannot stand or walk.  You have difficulty controlling when you urinate or when you have a bowel movement.  You feel nauseous or you vomit.  Your feet get very cold.  You have numbness, tingling, weakness, or problems using your arms or legs.  You develop any of the following: ? Shortness of breath. ? Dizziness. ? Pain in your legs. ? Weakness in your buttocks or legs. ? Discoloration of the skin on your toes or legs. This information is not intended to replace advice given to you by your health care provider. Make sure you discuss any questions you have with your health care provider. Document Released: 12/23/2004 Document Revised: 07/07/2018 Document Reviewed: 08/17/2015 Elsevier Patient Education  2020 [employer].

## 2018-09-26 ENCOUNTER — Ambulatory Visit: Payer: No Typology Code available for payment source | Admitting: Physical Therapy

## 2018-09-27 ENCOUNTER — Other Ambulatory Visit: Payer: Self-pay

## 2018-09-27 ENCOUNTER — Encounter: Payer: Self-pay | Admitting: Family Medicine

## 2018-09-27 ENCOUNTER — Encounter: Payer: Self-pay | Admitting: Physical Therapy

## 2018-09-27 ENCOUNTER — Ambulatory Visit: Payer: No Typology Code available for payment source | Admitting: Physical Therapy

## 2018-09-27 DIAGNOSIS — M5441 Lumbago with sciatica, right side: Secondary | ICD-10-CM | POA: Diagnosis not present

## 2018-09-27 DIAGNOSIS — M62838 Other muscle spasm: Secondary | ICD-10-CM

## 2018-09-27 DIAGNOSIS — M6281 Muscle weakness (generalized): Secondary | ICD-10-CM | POA: Diagnosis not present

## 2018-09-27 DIAGNOSIS — R293 Abnormal posture: Secondary | ICD-10-CM

## 2018-09-27 DIAGNOSIS — G8929 Other chronic pain: Secondary | ICD-10-CM

## 2018-09-27 NOTE — Patient Instructions (Signed)
Access Code: [email or web address]  URL: [email or web address]  Date: 09/27/2018  Prepared by: Faustino Congress   Exercises  Standing Quadratus Lumborum Stretch with Doorway - 3 reps - 1 sets - 30 sec hold - 2x daily - 7x weekly  Hooklying Single Knee to Chest - 3 reps - 1 sets - 30 sec hold - 2x daily - 7x weekly  Supine Double Knee to Chest - 3 reps - 1 sets - 30 sec hold - 2x daily - 7x weekly  Seated Hamstring Stretch - 3 reps - 1 sets - 30 sec hold - 2x daily - 7x weekly  Patient Education  Trigger Point Dry Needling  TENS Unit

## 2018-09-27 NOTE — Therapy (Signed)
[facility] [place], [place], [address] Phone: [phone]   Fax:  [phone]  Physical Therapy Evaluation  Patient Details  Name: Tracey Patterson MRN: [id number] Date of Birth: 1981-03-06 Referring Provider (PT): Gregor Hams, MD   Encounter Date: 09/27/2018  PT End of Session - 09/27/18 1136    Visit Number  1    Number of Visits  12    Date for PT Re-Evaluation  11/08/18    PT Start Time  1033    PT Stop Time  1130    PT Time Calculation (min)  57 min    Activity Tolerance  Patient tolerated treatment well    Behavior During Therapy  [facility] for tasks assessed/performed       Past Medical History:  Diagnosis Date  . Anxiety   . Asthma   . History of TMJ disorder     Past Surgical History:  Procedure Laterality Date  . BACK SURGERY  December 2015  . KNEE ARTHROSCOPY  1998   left  . LAPAROSCOPIC TUBAL LIGATION  02/28/2012   Procedure: LAPAROSCOPIC TUBAL LIGATION;  Surgeon: Cyril Mourning, MD;  Location: [facility];  Service: Gynecology;  Laterality: Bilateral;  . TONSILLECTOMY      There were no vitals filed for this visit.   Subjective Assessment - 09/27/18 1036    Subjective  Pt is a 38 y/o female who returns to OPPT for acute exacerbation of chronic LBP.  Pt with hx of lumbar surgery, injection, facet arthritis, nerve ablation 03/31/18 (with significant improvment).  Pt reports relief x 4 months, with return of pain worsening over past 2-3 weeks.  Pt reports difficulty with all activities.    Pertinent History  back surgery L4/5 microdiscectomy and laminectomy    Limitations  Sitting;Standing;Walking    How long can you sit comfortably?  < 15 min    How long can you stand comfortably?  variable: 10-15 min    Diagnostic tests  Rt facet joint arthritis and Lt hip arthritis    Patient Stated Goals  improve pain    Currently in Pain?  Yes    Pain Score  8    up to 9/10; at best 2/10   Pain  Location  Back    Pain Orientation  Right;Left;Lower    Pain Descriptors / Indicators  Aching;Shooting;Burning    Pain Radiating Towards  Rt lateral hip into front of thigh    Pain Onset  More than a month ago    Pain Frequency  Constant    Aggravating Factors   sitting, standing, walking, twisting, bending    Pain Relieving Factors  muscle relaxer, ibuprofen, heat         [facility] PT Assessment - 09/27/18 1043      Assessment   Medical Diagnosis  S39.012A (ICD-10-CM) - Lumbosacral strain, initial encounter    Referring Provider (PT)  Gregor Hams, MD    Onset Date/Surgical Date  --   chronic, recent exacerbation 2-3 weeks ago   Hand Dominance  Right    Next MD Visit  PRN    Prior Therapy  at this clinic ~ 1 year ago      Precautions   Precautions  None      Restrictions   Weight Bearing Restrictions  No      Balance Screen   Has the patient fallen in the past 6 months  No    Has the patient had  a decrease in activity level because of a fear of falling?   No    Is the patient reluctant to leave their home because of a fear of falling?   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; has to assist with moving patients; pushing portable machine    Leisure  reading, watching movies, hiking, walking      Cognition   Overall Cognitive Status  Within Functional Limits for tasks assessed      Observation/Other Assessments   Focus on Therapeutic Outcomes (FOTO)   39 (61% limited; predicted 50% limited)      Posture/Postural Control   Posture/Postural Control  Postural limitations    Postural Limitations  Rounded  Shoulders;Forward head      ROM / Strength   AROM / PROM / Strength  AROM;Strength      AROM   Overall AROM Comments  pain on Rt side with Lt quadrant    AROM Assessment Site  Lumbar    Lumbar Flexion  limited 50% with improvement 2nd rep    Lumbar Extension  WNL    Lumbar - Right Side Bend  WNL    Lumbar - Left Side Bend  WNL      Strength   Overall Strength  Within functional limits for tasks performed      Flexibility   Soft Tissue Assessment /Muscle Length  yes    Hamstrings  tightness bil    Piriformis  tightness bil    Quadratus Lumborum  tightness on Rt      Palpation   Spinal mobility  WNL; slightly hypomobile in L2-5    Palpation comment  trigger points in Rt QL into glutes and paraspinals      Special Tests    Special Tests  Lumbar    Lumbar Tests  Slump Test;Straight Leg Raise      Slump test   Findings  Negative    Comment  pain increased with decreased neural tension      Straight Leg Raise   Findings  Negative    Comment  hamstring tightness                Objective measurements completed on examination: See above findings.      [facility] Adult PT Treatment/Exercise - 09/27/18 1043      Self-Care   Self-Care  Other Self-Care Comments    Other Self-Care Comments   verbally reviewed HEP with pt, did not perform - will review next visit      Manual Therapy   Manual Therapy  Soft tissue mobilization    Soft tissue mobilization  STM to Rt QL       Trigger Point Dry Needling - 09/27/18 1124    Consent Given?  Yes    Education Handout Provided  Yes    Muscles Treated Back/Hip  Quadratus lumborum    Quadratus Lumborum Response  Twitch response elicited;Palpable increased muscle length           PT Education - 09/27/18 1136  Education Details  HEP, DN, TENS    Person(s) Educated  Patient    Methods  Explanation;Handout    Comprehension  Verbalized understanding;Need further instruction          PT Long Term Goals - 09/27/18  1141      PT LONG TERM GOAL #1   Title  independent with HEP    Status  New    Target Date  11/08/18      PT LONG TERM GOAL #2   Title  demo painfree Lumbar ROM WFL    Status  New    Target Date  11/08/18      PT LONG TERM GOAL #3   Title  report ability to sit/stand > 20 min without increase in pain for improved function and work responsibilities    Status  New    Target Date  11/08/18      PT LONG TERM GOAL #4   Title  demonstrate proper body mechanics to decrease risk of reinjury with work responsibilities    Status  New    Target Date  11/08/18      PT LONG TERM GOAL #5   Title  FOTO score improved to </= 50% limited    Status  New    Target Date  11/08/18             Plan - 09/27/18 1125    Clinical Impression Statement  Pt is a 38 y/o female who presents to OPPT with acute exacerbation of chronic LBP.  Pt demonstrates active trigger points in Rt QL and DN and manual therapy performed today with positive response.  Pt also with limited ROM, poor core strength and postural abnormalities affecting functional mobility.  Pt will benefit from PT to address deficits listed.    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/Moderate complexity    Clinical Decision Making  Moderate    Rehab Potential  Good    PT Frequency  2x / week    PT Duration  6 weeks    PT Treatment/Interventions  ADLs/Self Care Home Management;Cryotherapy;Electrical Stimulation;Moist Heat;Traction;Therapeutic exercise;Therapeutic activities;Functional mobility training;Gait training;Stair training;Ultrasound;Patient/family education;Manual techniques;Taping;Dry needling;Spinal  Manipulations    PT Next Visit Plan  review HEP, manual/modalities/DN PRN    PT Home Exercise Plan  Access Code: [email or web address]    Consulted and Agree with Plan of Care  Patient       Patient will benefit from skilled therapeutic intervention in order to improve the following deficits and impairments:  Hypomobility, Increased fascial restricitons, Increased muscle spasms, Pain, Decreased strength, Decreased range of motion, Impaired flexibility, Postural dysfunction  Visit Diagnosis: 1. Chronic right-sided low back pain with right-sided sciatica   2. Muscle weakness (generalized)   3. Other muscle spasm   4. Abnormal posture        Problem List Patient Active Problem List   Diagnosis Date Noted  . ANA positive 12/30/2017  . Fibromyalgia 11/03/2017  . Colitis 06/17/2017  . Left ovarian cyst 06/17/2017  . Left shoulder pain 09/07/2016  . Degenerative tear of acetabular labrum of left hip 07/15/2016  . Ganglion cyst 02/17/2016  . Generalized anxiety disorder 02/17/2016  . Recurrent sinusitis 02/17/2016  . Vitamin D deficiency 02/15/2014  . Asthma, chronic  12/14/2012  . GERD (gastroesophageal reflux disease) 12/14/2012  . Seasonal allergies 12/14/2012  . Anxiety and depression 12/14/2012  . Transaminitis 12/14/2012  . Myalgia and myositis 10/30/2012  . Other malaise and fatigue 10/26/2012  . Flushing 10/26/2012  . Insomnia 08/13/2012  . Stress and adjustment reaction 08/13/2012      Laureen Abrahams, PT, DPT 09/27/18 11:47 AM     [facility] [place], [place], [address] Phone: [phone]   Fax:  [phone]  Name: Tracey Patterson MRN: [id number] Date of Birth: 26-Oct-1980

## 2018-10-02 ENCOUNTER — Encounter: Payer: Self-pay | Admitting: Family Medicine

## 2018-10-02 NOTE — Telephone Encounter (Signed)
Left message on machine for patient to call back to let her know [employer] paperwork ready, to see if what she wants to do with obtaining paperwork.

## 2018-10-03 NOTE — Telephone Encounter (Signed)
Forms faxed to [employer] at [phone] with confirmation received. Copy to scan and copy to Dr. Georgina Snell.

## 2018-10-04 ENCOUNTER — Ambulatory Visit: Payer: No Typology Code available for payment source | Admitting: Physical Therapy

## 2018-10-04 ENCOUNTER — Other Ambulatory Visit: Payer: Self-pay

## 2018-10-04 DIAGNOSIS — G8929 Other chronic pain: Secondary | ICD-10-CM

## 2018-10-04 DIAGNOSIS — M5441 Lumbago with sciatica, right side: Secondary | ICD-10-CM

## 2018-10-04 DIAGNOSIS — R293 Abnormal posture: Secondary | ICD-10-CM | POA: Diagnosis not present

## 2018-10-04 DIAGNOSIS — M62838 Other muscle spasm: Secondary | ICD-10-CM

## 2018-10-04 DIAGNOSIS — M6281 Muscle weakness (generalized): Secondary | ICD-10-CM | POA: Diagnosis not present

## 2018-10-04 NOTE — Therapy (Signed)
[place], [place], [address] Phone: [phone]   Fax:  [phone]  Physical Therapy Treatment  Patient Details  Name: Tracey Patterson MRN: [id number] Date of Birth: 04/01/1980 Referring Provider (PT): Gregor Hams, MD   Encounter Date: 10/04/2018  PT End of Session - 10/04/18 1550    Visit Number  2    Number of Visits  12    Date for PT Re-Evaluation  11/08/18    PT Start Time  1503    PT Stop Time  1556    PT Time Calculation (min)  53 min    Activity Tolerance  Patient tolerated treatment well    Behavior During Therapy  [facility] for tasks assessed/performed       Past Medical History:  Diagnosis Date  . Anxiety   . Asthma   . History of TMJ disorder     Past Surgical History:  Procedure Laterality Date  . BACK SURGERY  December 2015  . KNEE ARTHROSCOPY  1998   left  . LAPAROSCOPIC TUBAL LIGATION  02/28/2012   Procedure: LAPAROSCOPIC TUBAL LIGATION;  Surgeon: Cyril Mourning, MD;  Location: [facility];  Service: Gynecology;  Laterality: Bilateral;  . TONSILLECTOMY      There were no vitals filed for this visit.  Subjective Assessment - 10/04/18 1507    Subjective  Pt reports she fell on Monday when trying to step over a baby gate; since then her shoulder is bothering her some.   Overall her pain is a little better. Tightness and stiffness persist.  Wearing led apron has been "ramping up" her back pain.    Pertinent History  back surgery L4/5 microdiscectomy and laminectomy    Limitations  Sitting;Standing;Walking    Diagnostic tests  Rt facet joint arthritis and Lt hip arthritis    Patient Stated Goals  improve pain    Currently in Pain?  Yes    Pain Score  3     Pain Location  Back    Pain Orientation  Right;Left;Lower    Pain Descriptors / Indicators  Tightness;Aching    Pain Radiating Towards  Left hip.    Aggravating Factors   prolonged standing or sitting    Pain Relieving  Factors  laying down, muscle relaxer, ibuprofen, heat         [facility] PT Assessment - 10/04/18 0001      Assessment   Medical Diagnosis  S39.012A (ICD-10-CM) - Lumbosacral strain, initial encounter    Referring Provider (PT)  Gregor Hams, MD    Onset Date/Surgical Date  --   chronic, recent exacerbation 2-3 weeks ago   Hand Dominance  Right    Next MD Visit  PRN    Prior Therapy  at this clinic ~ 1 year ago       [facility] Adult PT Treatment/Exercise - 10/04/18 0001      Lumbar Exercises: Stretches   Passive Hamstring Stretch  Right;Left;2 reps;30 seconds    Single Knee to Chest Stretch  Right;Left;2 reps;30 seconds    Piriformis Stretch  Right;Left;1 rep;30 seconds      Lumbar Exercises: Aerobic   Nustep  L4: arms/legs x 6 min       Lumbar Exercises: Standing   Other Standing Lumbar Exercises  Standing QL stretch x 20 sec each side (per HEP);  mid level and high level doorway stretch x 20 sec x 2 reps       Lumbar Exercises:  Supine   Bridge  5 reps   limited clearance due to pain      Lumbar Exercises: Sidelying   Other Sidelying Lumbar Exercises  L sidelying over bolster, x 2 reps (one over black bolster, one over green bolster)       Lumbar Exercises: Quadruped   Other Quadruped Lumbar Exercises  child pose 20 sec, x 2, then with lateral flexion.       Modalities   Modalities  Electrical Stimulation;Moist Heat      Moist Heat Therapy   Number Minutes Moist Heat  10 Minutes    Moist Heat Location  Lumbar Spine      Electrical Stimulation   Electrical Stimulation Location  bilat QL    Electrical Stimulation Action  IFC    Electrical Stimulation Parameters  to tolerance    Electrical Stimulation Goals  Pain      Manual Therapy   Soft tissue mobilization  IASTM and STM to bilat QL and paraspinals with pt in prone to decrease fascial restrictions.                   PT Long Term Goals - 09/27/18 1141      PT LONG TERM GOAL #1   Title  independent with  HEP    Status  New    Target Date  11/08/18      PT LONG TERM GOAL #2   Title  demo painfree Lumbar ROM WFL    Status  New    Target Date  11/08/18      PT LONG TERM GOAL #3   Title  report ability to sit/stand > 20 min without increase in pain for improved function and work responsibilities    Status  New    Target Date  11/08/18      PT LONG TERM GOAL #4   Title  demonstrate proper body mechanics to decrease risk of reinjury with work responsibilities    Status  New    Target Date  11/08/18      PT LONG TERM GOAL #5   Title  FOTO score improved to </= 50% limited    Status  New    Target Date  11/08/18            Plan - 10/04/18 1551    Clinical Impression Statement  Pt reports improved tightness after DN last session. Continued palpable tightness in Rt QL.  She had difficulty tolerating Lt hamstring stretch due to increased pain in Lt hip and bridge due to increased LBP, otherwise all other exercises tolerated well. Goals are ongoing at this time.    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/Moderate complexity    Rehab Potential  Good    PT Frequency  2x / week    PT Duration  6 weeks    PT Treatment/Interventions  ADLs/Self Care Home Management;Cryotherapy;Electrical Stimulation;Moist Heat;Traction;Therapeutic exercise;Therapeutic activities;Functional mobility training;Gait training;Stair training;Ultrasound;Patient/family education;Manual techniques;Taping;Dry needling;Spinal Manipulations    PT Next Visit Plan  review HEP, manual/modalities/DN PRN    PT Home Exercise Plan  Access Code: [email or web address]    Consulted and Agree with Plan of Care  Patient        Patient will benefit from skilled therapeutic  intervention in order to improve the following deficits and impairments:  Hypomobility, Increased fascial restricitons, Increased muscle spasms, Pain, Decreased strength, Decreased range of motion, Impaired flexibility, Postural dysfunction  Visit Diagnosis: 1. Muscle weakness (generalized)   2. Other muscle spasm   3. Chronic right-sided low back pain with right-sided sciatica   4. Abnormal posture        Problem List Patient Active Problem List   Diagnosis Date Noted  . ANA positive 12/30/2017  . Fibromyalgia 11/03/2017  . Colitis 06/17/2017  . Left ovarian cyst 06/17/2017  . Left shoulder pain 09/07/2016  . Degenerative tear of acetabular labrum of left hip 07/15/2016  . Ganglion cyst 02/17/2016  . Generalized anxiety disorder 02/17/2016  . Recurrent sinusitis 02/17/2016  . Vitamin D deficiency 02/15/2014  . Asthma, chronic 12/14/2012  . GERD (gastroesophageal reflux disease) 12/14/2012  . Seasonal allergies 12/14/2012  . Anxiety and depression 12/14/2012  . Transaminitis 12/14/2012  . Myalgia and myositis 10/30/2012  . Other malaise and fatigue 10/26/2012  . Flushing 10/26/2012  . Insomnia 08/13/2012  . Stress and adjustment reaction 08/13/2012   Kerin Perna, PTA 10/04/18 4:00 PM  [place], [place], [address] Phone: [phone]   Fax:  [phone]  Name: Tracey Patterson MRN: [id number] Date of Birth: 1980/11/28

## 2018-10-06 ENCOUNTER — Other Ambulatory Visit: Payer: Self-pay

## 2018-10-06 ENCOUNTER — Ambulatory Visit: Payer: No Typology Code available for payment source | Admitting: Physical Therapy

## 2018-10-06 DIAGNOSIS — M6281 Muscle weakness (generalized): Secondary | ICD-10-CM

## 2018-10-06 DIAGNOSIS — M5441 Lumbago with sciatica, right side: Secondary | ICD-10-CM | POA: Diagnosis not present

## 2018-10-06 DIAGNOSIS — M62838 Other muscle spasm: Secondary | ICD-10-CM

## 2018-10-06 DIAGNOSIS — R293 Abnormal posture: Secondary | ICD-10-CM

## 2018-10-06 DIAGNOSIS — G8929 Other chronic pain: Secondary | ICD-10-CM

## 2018-10-06 NOTE — Therapy (Signed)
[place], [place], [address] Phone: [phone]   Fax:  [phone]  Physical Therapy Treatment  Patient Details  Name: Tracey Patterson MRN: [id number] Date of Birth: 1980/12/15 Referring Provider (PT): Gregor Hams, MD   Encounter Date: 10/06/2018  PT End of Session - 10/06/18 0914    Visit Number  3    Number of Visits  12    Date for PT Re-Evaluation  11/08/18    PT Start Time  0900    PT Stop Time  0953    PT Time Calculation (min)  53 min    Activity Tolerance  Patient tolerated treatment well    Behavior During Therapy  [facility] for tasks assessed/performed       Past Medical History:  Diagnosis Date  . Anxiety   . Asthma   . History of TMJ disorder     Past Surgical History:  Procedure Laterality Date  . BACK SURGERY  December 2015  . KNEE ARTHROSCOPY  1998   left  . LAPAROSCOPIC TUBAL LIGATION  02/28/2012   Procedure: LAPAROSCOPIC TUBAL LIGATION;  Surgeon: Cyril Mourning, MD;  Location: [facility];  Service: Gynecology;  Laterality: Bilateral;  . TONSILLECTOMY      There were no vitals filed for this visit.  Subjective Assessment - 10/06/18 0915    Subjective  Pt reports her back has felt a little better, "I don't have the constant pain.  But it does take a while after I get up to loosen up".  She went 6 hrs into work before needing ibuprofen.    Pertinent History  back surgery L4/5 microdiscectomy and laminectomy    Limitations  Sitting;Standing;Walking    Diagnostic tests  Rt facet joint arthritis and Lt hip arthritis    Patient Stated Goals  improve pain    Currently in Pain?  Yes    Pain Score  4     Pain Location  Hip    Pain Orientation  Left    Pain Descriptors / Indicators  Tightness    Aggravating Factors   standing prolonged    Pain Relieving Factors  ibuprofen, heat         [facility] PT Assessment - 10/06/18 0001      Assessment   Medical Diagnosis  S39.012A  (ICD-10-CM) - Lumbosacral strain, initial encounter    Referring Provider (PT)  Gregor Hams, MD    Onset Date/Surgical Date  --   chronic, recent exacerbation 2-3 weeks ago   Hand Dominance  Right    Next MD Visit  PRN    Prior Therapy  at this clinic ~ 1 year ago      Palpation   SI assessment   Rt ASIS lower than Lt. Rt sacral torsion. Rt PSIS higher than Lt.       [facility] Adult PT Treatment/Exercise - 10/06/18 0001      Lumbar Exercises: Stretches   Passive Hamstring Stretch  Right;Left;2 reps;30 seconds    Prone on Elbows Stretch  2 reps;60 seconds    Piriformis Stretch  Right;Left;2 reps;30 seconds      Lumbar Exercises: Supine   Bridge  --   2 reps - during MET corrections to aid in realignment.      Modalities   Modalities  Electrical Stimulation;Moist Heat      Moist Heat Therapy   Number Minutes Moist Heat  12 Minutes   Pt in Rt sidelying  Moist Heat Location  Lumbar Spine;Hip   Lt hip     Electrical Stimulation   Electrical Stimulation Location  Rt QL/  Lt piriformis    Electrical Stimulation Action  premod to each area    Electrical Stimulation Parameters  intensity to tolerance    Electrical Stimulation Goals  Pain      Manual Therapy   Manual Therapy  Soft tissue mobilization;Muscle Energy Technique    Soft tissue mobilization  TPR with contract relax to Lt hip rotators and abductors     Muscle Energy Technique  MET to correct anteriorly rotated Rt ilium; MET to correct Rt sacral torsion.         PT Long Term Goals - 09/27/18 1141      PT LONG TERM GOAL #1   Title  independent with HEP    Status  New    Target Date  11/08/18      PT LONG TERM GOAL #2   Title  demo painfree Lumbar ROM WFL    Status  New    Target Date  11/08/18      PT LONG TERM GOAL #3   Title  report ability to sit/stand > 20 min without increase in pain for improved function and work responsibilities    Status  New    Target Date  11/08/18      PT LONG TERM GOAL #4    Title  demonstrate proper body mechanics to decrease risk of reinjury with work responsibilities    Status  New    Target Date  11/08/18      PT LONG TERM GOAL #5   Title  FOTO score improved to </= 50% limited    Status  New    Target Date  11/08/18            Plan - 10/06/18 0951    Clinical Impression Statement  Overall improvement in Rt lower back pain.  Pt presented with pelvis asymmetries; improved with MET corrections. Limited change in Lt hip pain at end of session. Progressing towards goals.    Personal Factors and Comorbidities  Past/Current Experience;Comorbidity 3+    Comorbidities  arthritis, hx of lumbar microdiscectomy and laminectomy, depression, anxiety    Examination-Activity Limitations  Bend;Caring for Others;Sit;Stand;Locomotion Level    Examination-Participation Restrictions  Other;Driving    Rehab Potential  Good    PT Frequency  2x / week    PT Duration  6 weeks    PT Treatment/Interventions  ADLs/Self Care Home Management;Cryotherapy;Electrical Stimulation;Moist Heat;Traction;Therapeutic exercise;Therapeutic activities;Functional mobility training;Gait training;Stair training;Ultrasound;Patient/family education;Manual techniques;Taping;Dry needling;Spinal Manipulations    PT Next Visit Plan  review HEP, manual/modalities/DN PRN    PT Home Exercise Plan  Access Code: [email or web address]    Consulted and Agree with Plan of Care  Patient       Patient will benefit from skilled therapeutic intervention in order to improve the following deficits and impairments:  Hypomobility, Increased fascial restricitons, Increased muscle spasms, Pain, Decreased strength, Decreased range of motion, Impaired flexibility, Postural dysfunction  Visit Diagnosis: 1. Muscle weakness (generalized)   2. Other muscle spasm   3. Chronic right-sided low back pain with right-sided sciatica   4. Abnormal posture        Problem List Patient Active Problem List   Diagnosis Date Noted  .  ANA positive 12/30/2017  . Fibromyalgia 11/03/2017  . Colitis 06/17/2017  . Left ovarian cyst 06/17/2017  . Left shoulder pain 09/07/2016  . Degenerative tear of  acetabular labrum of left hip 07/15/2016  . Ganglion cyst 02/17/2016  . Generalized anxiety disorder 02/17/2016  . Recurrent sinusitis 02/17/2016  . Vitamin D deficiency 02/15/2014  . Asthma, chronic 12/14/2012  . GERD (gastroesophageal reflux disease) 12/14/2012  . Seasonal allergies 12/14/2012  . Anxiety and depression 12/14/2012  . Transaminitis 12/14/2012  . Myalgia and myositis 10/30/2012  . Other malaise and fatigue 10/26/2012  . Flushing 10/26/2012  . Insomnia 08/13/2012  . Stress and adjustment reaction 08/13/2012    Kerin Perna, PTA 10/06/18 10:00 AM  [place], [place], [address] Phone: [phone]   Fax:  [phone]  Name: Charlann Wayne MRN: [id number] Date of Birth: October 27, 1980

## 2018-10-09 ENCOUNTER — Other Ambulatory Visit: Payer: Self-pay

## 2018-10-09 ENCOUNTER — Encounter: Payer: Self-pay | Admitting: Physical Therapy

## 2018-10-09 ENCOUNTER — Ambulatory Visit: Payer: No Typology Code available for payment source | Admitting: Physical Therapy

## 2018-10-09 DIAGNOSIS — M5441 Lumbago with sciatica, right side: Secondary | ICD-10-CM

## 2018-10-09 DIAGNOSIS — M62838 Other muscle spasm: Secondary | ICD-10-CM

## 2018-10-09 DIAGNOSIS — M6281 Muscle weakness (generalized): Secondary | ICD-10-CM | POA: Diagnosis not present

## 2018-10-09 DIAGNOSIS — R293 Abnormal posture: Secondary | ICD-10-CM

## 2018-10-09 DIAGNOSIS — G8929 Other chronic pain: Secondary | ICD-10-CM

## 2018-10-09 NOTE — Therapy (Signed)
[place], [place], [address] Phone: [phone]   Fax:  [phone]  Physical Therapy Treatment  Patient Details  Name: Tracey Patterson MRN: [id number] Date of Birth: 1980-10-19 Referring Provider (PT): Gregor Hams, MD   Encounter Date: 10/09/2018  PT End of Session - 10/09/18 1541    Visit Number  4    Number of Visits  12    Date for PT Re-Evaluation  11/08/18    PT Start Time  6270    PT Stop Time  1625    PT Time Calculation (min)  50 min    Activity Tolerance  Patient tolerated treatment well    Behavior During Therapy  [facility] for tasks assessed/performed       Past Medical History:  Diagnosis Date  . Anxiety   . Asthma   . History of TMJ disorder     Past Surgical History:  Procedure Laterality Date  . BACK SURGERY  December 2015  . KNEE ARTHROSCOPY  1998   left  . LAPAROSCOPIC TUBAL LIGATION  02/28/2012   Procedure: LAPAROSCOPIC TUBAL LIGATION;  Surgeon: Cyril Mourning, MD;  Location: [facility];  Service: Gynecology;  Laterality: Bilateral;  . TONSILLECTOMY      There were no vitals filed for this visit.  Subjective Assessment - 10/09/18 1539    Subjective  Pt states pain is a bit better, but she had increased pain saturday night. She states worst pain first thing in am, and at end of work shift.    Currently in Pain?  Yes    Pain Score  4     Pain Location  Back    Pain Orientation  Right;Left    Pain Descriptors / Indicators  Aching;Tightness    Pain Onset  More than a month ago    Pain Frequency  Intermittent                       [facility] Adult PT Treatment/Exercise - 10/09/18 1542      Lumbar Exercises: Stretches   Passive Hamstring Stretch  Right;Left;2 reps;30 seconds    Prone on Elbows Stretch  --    Piriformis Stretch  Right;Left;2 reps;30 seconds    Piriformis Stretch Limitations  supine fig 4      Lumbar Exercises: Aerobic   Stationary Bike   L2 x 6 min;       Lumbar Exercises: Standing   Other Standing Lumbar Exercises  Standing QL stretch x 20 sec each side P);       Lumbar Exercises: Supine   Ab Set  10 reps;3 seconds    Clam  20 reps    Clam Limitations  RTB and TA    Bent Knee Raise  20 reps    Bent Knee Raise Limitations  with TA    Bridge  15 reps      Lumbar Exercises: Quadruped   Other Quadruped Lumbar Exercises  child pose 20 sec, x 2, then with lateral flexion.       Modalities   Modalities  --      Moist Heat Therapy   Moist Heat Location  --   Lt hip     Electrical Stimulation   Electrical Stimulation Location  --    Electrical Stimulation Goals  --      Manual Therapy   Manual Therapy  Soft tissue mobilization;Muscle Energy Technique    Manual therapy comments  skilled palpation and monitoring of soft tissue during dry needling.     Soft tissue mobilization  DTM to Bil lumbar region/paraspinals.     Muscle Energy Technique  MET to correct anteriorly rotated Rt ilium;        Trigger Point Dry Needling - 10/09/18 0001    Consent Given?  Yes    Education Handout Provided  Previously provided    Muscles Treated Back/Hip  Piriformis;Obturator internus;Lumbar multifidi;Gluteus medius    Gluteus Medius Response  Palpable increased muscle length   L   Piriformis Response  Palpable increased muscle length   L   Lumbar multifidi Response  Palpable increased muscle length   Bilateral   Obturator internus Response  Palpable increased muscle length   L               PT Long Term Goals - 09/27/18 1141      PT LONG TERM GOAL #1   Title  independent with HEP    Status  New    Target Date  11/08/18      PT LONG TERM GOAL #2   Title  demo painfree Lumbar ROM WFL    Status  New    Target Date  11/08/18      PT LONG TERM GOAL #3   Title  report ability to sit/stand > 20 min without increase in pain for improved function and work responsibilities    Status  New    Target Date  11/08/18       PT LONG TERM GOAL #4   Title  demonstrate proper body mechanics to decrease risk of reinjury with work responsibilities    Status  New    Target Date  11/08/18      PT LONG TERM GOAL #5   Title  FOTO score improved to </= 50% limited    Status  New    Target Date  11/08/18            Plan - 10/09/18 1630    Clinical Impression Statement  Pt with decreased pain in R QL region today, more pain at multifidus bilaterally R>L. DN done for this, as well as L hip rotators, which are also sore and restricted. Pt educated on TA contraction, added light stabilization. Pt will benefit from further education on this. Pt continues to be very limited with lumbar flexion.    Personal Factors and Comorbidities  Past/Current Experience;Comorbidity 3+    Comorbidities  arthritis, hx of lumbar microdiscectomy and laminectomy, depression, anxiety    Examination-Activity Limitations  Bend;Caring for Others;Sit;Stand;Locomotion Level    Examination-Participation Restrictions  Other;Driving    Rehab Potential  Good    PT Frequency  2x / week    PT Duration  6 weeks    PT Treatment/Interventions  ADLs/Self Care Home Management;Cryotherapy;Electrical Stimulation;Moist Heat;Traction;Therapeutic exercise;Therapeutic activities;Functional mobility training;Gait training;Stair training;Ultrasound;Patient/family education;Manual techniques;Taping;Dry needling;Spinal Manipulations    PT Next Visit Plan  review HEP, manual/modalities/DN PRN    PT Home Exercise Plan  Access Code: [email or web address]    Consulted and Agree with Plan of Care  Patient       Patient will benefit from skilled therapeutic intervention in order to improve the following deficits and impairments:  Hypomobility, Increased fascial restricitons, Increased muscle spasms, Pain, Decreased strength, Decreased range of motion, Impaired flexibility, Postural dysfunction  Visit Diagnosis: 1. Muscle weakness (generalized)   2. Chronic right-sided low  back pain with right-sided sciatica   3. Other muscle spasm  4. Abnormal posture        Problem List Patient Active Problem List   Diagnosis Date Noted  . ANA positive 12/30/2017  . Fibromyalgia 11/03/2017  . Colitis 06/17/2017  . Left ovarian cyst 06/17/2017  . Left shoulder pain 09/07/2016  . Degenerative tear of acetabular labrum of left hip 07/15/2016  . Ganglion cyst 02/17/2016  . Generalized anxiety disorder 02/17/2016  . Recurrent sinusitis 02/17/2016  . Vitamin D deficiency 02/15/2014  . Asthma, chronic 12/14/2012  . GERD (gastroesophageal reflux disease) 12/14/2012  . Seasonal allergies 12/14/2012  . Anxiety and depression 12/14/2012  . Transaminitis 12/14/2012  . Myalgia and myositis 10/30/2012  . Other malaise and fatigue 10/26/2012  . Flushing 10/26/2012  . Insomnia 08/13/2012  . Stress and adjustment reaction 08/13/2012    Lyndee Hensen, PT, DPT 4:34 PM  10/09/18    [facility] [place], [place], [address] Phone: [phone]   Fax:  [phone]  Name: Tracey Patterson MRN: [id number] Date of Birth: 1980/06/25

## 2018-10-12 ENCOUNTER — Other Ambulatory Visit: Payer: Self-pay

## 2018-10-12 ENCOUNTER — Ambulatory Visit: Payer: No Typology Code available for payment source | Admitting: Physical Therapy

## 2018-10-12 ENCOUNTER — Telehealth: Payer: Self-pay | Admitting: Family Medicine

## 2018-10-12 DIAGNOSIS — M6281 Muscle weakness (generalized): Secondary | ICD-10-CM | POA: Diagnosis not present

## 2018-10-12 DIAGNOSIS — M5441 Lumbago with sciatica, right side: Secondary | ICD-10-CM | POA: Diagnosis not present

## 2018-10-12 DIAGNOSIS — G8929 Other chronic pain: Secondary | ICD-10-CM

## 2018-10-12 DIAGNOSIS — M62838 Other muscle spasm: Secondary | ICD-10-CM | POA: Diagnosis not present

## 2018-10-12 DIAGNOSIS — M25552 Pain in left hip: Secondary | ICD-10-CM

## 2018-10-12 DIAGNOSIS — R293 Abnormal posture: Secondary | ICD-10-CM | POA: Diagnosis not present

## 2018-10-12 NOTE — Telephone Encounter (Signed)
Adding left hip pain for PT

## 2018-10-12 NOTE — Therapy (Signed)
[place], [place], [address] Phone: [phone]   Fax:  [phone]  Physical Therapy Treatment  Patient Details  Name: Tracey Patterson MRN: [id number] Date of Birth: [birth year] Referring Provider (PT): Gregor Hams, MD   Encounter Date: 10/12/2018  PT End of Session - 10/12/18 1719    Visit Number  5    Number of Visits  12    Date for PT Re-Evaluation  11/08/18    PT Start Time  1962    PT Stop Time  1649    PT Time Calculation (min)  44 min    Activity Tolerance  Patient limited by pain    Behavior During Therapy  [facility] for tasks assessed/performed       Past Medical History:  Diagnosis Date  . Anxiety   . Asthma   . History of TMJ disorder     Past Surgical History:  Procedure Laterality Date  . BACK SURGERY  December 2015  . KNEE ARTHROSCOPY  1998   left  . LAPAROSCOPIC TUBAL LIGATION  02/28/2012   Procedure: LAPAROSCOPIC TUBAL LIGATION;  Surgeon: Cyril Mourning, MD;  Location: [facility];  Service: Gynecology;  Laterality: Bilateral;  . TONSILLECTOMY      There were no vitals filed for this visit.  Subjective Assessment - 10/12/18 1608    Subjective  Pt reports her low back has felt some better.  Her Lt hip has been painful; "I was limping when I got up from bed".  If she sits >15 min at work, her back and hip feel stiff.  Lt hip was really bothering her at work last night.  She has begun to use TENS at home for pain relief.    Currently in Pain?  Yes    Pain Score  3    up to 7/10 with walking   Pain Location  Hip    Pain Orientation  Left, posterior         [facility] PT Assessment - 10/12/18 0001      Assessment   Medical Diagnosis  S39.012A (ICD-10-CM) - Lumbosacral strain, initial encounter    Referring Provider (PT)  Gregor Hams, MD    Onset Date/Surgical Date  --   chronic, recent exacerbation 2-3 weeks ago   Hand Dominance  Right    Next MD Visit  PRN    Prior  Therapy  at this clinic ~ 1 year ago       [facility] Adult PT Treatment/Exercise - 10/12/18 0001      Lumbar Exercises: Stretches   Passive Hamstring Stretch  Right;Left;30 seconds;3 reps    Piriformis Stretch  Right;Left;2 reps;30 seconds    Piriformis Stretch Limitations  trial of modified pigeon pose with Lt leg on table, RLE off table - pt reported increased LBP with forward lean - switched to supine fig 4 pulling towards chest      Lumbar Exercises: Aerobic   Nustep  L5: 6 min       Lumbar Exercises: Supine   Ab Set  5 reps;3 seconds    Clam  20 reps    Clam Limitations  RTB and TA    Bent Knee Raise  20 reps;3 seconds   with opp arm raise to 90 deg.    Bent Knee Raise Limitations  with TA      Lumbar Exercises: Sidelying   Clam  Left;Right;10 reps;2 seconds    Clam Limitations  and reverse  clams x 10 reps       Modalities   Modalities  --   declined; will use at home.      Manual Therapy   Soft tissue mobilization  STM to Lt glute med, piriformis;  IASTM and STM to Rt QL and lumbar paraspinals.                   PT Long Term Goals - 09/27/18 1141      PT LONG TERM GOAL #1   Title  independent with HEP    Status  New    Target Date  11/08/18      PT LONG TERM GOAL #2   Title  demo painfree Lumbar ROM WFL    Status  New    Target Date  11/08/18      PT LONG TERM GOAL #3   Title  report ability to sit/stand > 20 min without increase in pain for improved function and work responsibilities    Status  New    Target Date  11/08/18      PT LONG TERM GOAL #4   Title  demonstrate proper body mechanics to decrease risk of reinjury with work responsibilities    Status  New    Target Date  11/08/18      PT LONG TERM GOAL #5   Title  FOTO score improved to </= 50% limited    Status  New    Target Date  11/08/18            Plan - 10/12/18 1714    Clinical Impression Statement  Pt continues to Rt low back tightness/ tenderness at QL, multifidi and SI  joint. She had difficulty maintaining TA contraction during core exercise.  Peristent Lt hip pain, especially with transition from sitting to standing; may benefit from further evaluation of this area next visit. Goals are ongoing at this time.    Comorbidities  arthritis, hx of lumbar microdiscectomy and laminectomy, depression, anxiety    Rehab Potential  Good    PT Frequency  2x / week    PT Duration  6 weeks    PT Treatment/Interventions  ADLs/Self Care Home Management;Cryotherapy;Electrical Stimulation;Moist Heat;Traction;Therapeutic exercise;Therapeutic activities;Functional mobility training;Gait training;Stair training;Ultrasound;Patient/family education;Manual techniques;Taping;Dry needling;Spinal Manipulations    PT Next Visit Plan  spine stabilization, manual/ DN as indicated.  further assessment of Lt hip. Trial of ionto to Lt hip?   PT Home Exercise Plan  Access Code: [email or web address]    Consulted and Agree with Plan of Care  Patient       Patient will benefit from skilled therapeutic intervention in order to improve the following deficits and impairments:  Hypomobility, Increased fascial restricitons, Increased muscle spasms, Pain, Decreased strength, Decreased range of motion, Impaired flexibility, Postural dysfunction  Visit Diagnosis: 1. Chronic right-sided low back pain with right-sided sciatica   2. Muscle weakness (generalized)   3. Other muscle spasm   4. Abnormal posture        Problem List Patient Active Problem List   Diagnosis Date Noted  . ANA positive 12/30/2017  . Fibromyalgia 11/03/2017  . Colitis 06/17/2017  . Left ovarian cyst 06/17/2017  . Left shoulder pain 09/07/2016  . Degenerative tear of acetabular labrum of left hip 07/15/2016  . Ganglion cyst 02/17/2016  . Generalized anxiety disorder 02/17/2016  . Recurrent sinusitis 02/17/2016  . Vitamin D deficiency 02/15/2014  . Asthma, chronic 12/14/2012  . GERD (gastroesophageal reflux disease) 12/14/2012  .  Seasonal  allergies 12/14/2012  . Anxiety and depression 12/14/2012  . Transaminitis 12/14/2012  . Myalgia and myositis 10/30/2012  . Other malaise and fatigue 10/26/2012  . Flushing 10/26/2012  . Insomnia 08/13/2012  . Stress and adjustment reaction 08/13/2012   Kerin Perna, PTA 10/12/18 5:22 PM  [place], [place], [address] Phone: [phone]   Fax:  [phone]  Name: Tracey Patterson MRN: [id number] Date of Birth: 1980-08-18

## 2018-10-16 ENCOUNTER — Encounter: Payer: Self-pay | Admitting: Physical Therapy

## 2018-10-16 ENCOUNTER — Ambulatory Visit (INDEPENDENT_AMBULATORY_CARE_PROVIDER_SITE_OTHER): Payer: No Typology Code available for payment source | Admitting: Physical Therapy

## 2018-10-16 ENCOUNTER — Other Ambulatory Visit: Payer: Self-pay | Admitting: Osteopathic Medicine

## 2018-10-16 ENCOUNTER — Other Ambulatory Visit: Payer: Self-pay

## 2018-10-16 DIAGNOSIS — M62838 Other muscle spasm: Secondary | ICD-10-CM

## 2018-10-16 DIAGNOSIS — R293 Abnormal posture: Secondary | ICD-10-CM

## 2018-10-16 DIAGNOSIS — M5441 Lumbago with sciatica, right side: Secondary | ICD-10-CM | POA: Diagnosis not present

## 2018-10-16 DIAGNOSIS — M6281 Muscle weakness (generalized): Secondary | ICD-10-CM | POA: Diagnosis not present

## 2018-10-16 DIAGNOSIS — G8929 Other chronic pain: Secondary | ICD-10-CM

## 2018-10-16 MED FILL — AMITRIPTYLINE HCL 100 MG TA: 100 | 30 days supply | Qty: 30 | Fill #0

## 2018-10-16 NOTE — Therapy (Signed)
[place], [place], [address] Phone: [phone]   Fax:  [phone]  Physical Therapy Treatment  Patient Details  Name: Tracey Patterson MRN: [id number] Date of Birth: 1980-10-11 Referring Provider (PT): Gregor Hams, MD   Encounter Date: 10/16/2018  PT End of Session - 10/16/18 1035    Visit Number  6    Number of Visits  12    Date for PT Re-Evaluation  11/08/18    PT Start Time  0832    PT Stop Time  0920    PT Time Calculation (min)  48 min    Activity Tolerance  Patient limited by pain    Behavior During Therapy  [facility] for tasks assessed/performed       Past Medical History:  Diagnosis Date  . Anxiety   . Asthma   . History of TMJ disorder     Past Surgical History:  Procedure Laterality Date  . BACK SURGERY  December 2015  . KNEE ARTHROSCOPY  1998   left  . LAPAROSCOPIC TUBAL LIGATION  02/28/2012   Procedure: LAPAROSCOPIC TUBAL LIGATION;  Surgeon: Cyril Mourning, MD;  Location: [facility];  Service: Gynecology;  Laterality: Bilateral;  . TONSILLECTOMY      There were no vitals filed for this visit.  Subjective Assessment - 10/16/18 1033    Subjective  Pt states variable pain. Her back was feeling better, and then she had significant pain in back and L hip in past 2 days.    Patient Stated Goals  improve pain    Currently in Pain?  Yes    Pain Score  5     Pain Location  Hip    Pain Orientation  Left    Pain Descriptors / Indicators  Aching;Tightness    Pain Type  Chronic pain    Pain Onset  More than a month ago    Pain Frequency  Intermittent    Multiple Pain Sites  Yes    Pain Score  4    Pain Location  Back    Pain Orientation  Right    Pain Descriptors / Indicators  Aching;Tightness    Pain Type  Chronic pain    Pain Onset  More than a month ago    Pain Frequency  Intermittent                       [facility] Adult PT Treatment/Exercise - 10/16/18 0842       Lumbar Exercises: Stretches   Passive Hamstring Stretch  --    Piriformis Stretch  Right;Left;2 reps;30 seconds    Piriformis Stretch Limitations  seated fig 4      Lumbar Exercises: Aerobic   Stationary Bike  L1 x 8 min      Lumbar Exercises: Standing   Other Standing Lumbar Exercises  Standing QL stretch x 20 sec each side P);       Lumbar Exercises: Supine   Ab Set  5 reps;3 seconds    Clam  --    Clam Limitations  --    Bent Knee Raise  --    Bent Knee Raise Limitations  --    Bridge  15 reps    Straight Leg Raise  10 reps    Straight Leg Raises Limitations  bil, with TA      Lumbar Exercises: Sidelying   Clam  Left;Right;10 reps;2 seconds    Clam Limitations  --  Modalities   Modalities  --      Manual Therapy   Manual therapy comments  skilled palpation and monitoring of soft tissue during dry needling.     Soft tissue mobilization  DTM to Lt glute med, piriformis and R low lumbar region.        Trigger Point Dry Needling - 10/16/18 0001    Consent Given?  Yes    Education Handout Provided  Previously provided    Muscles Treated Back/Hip  Piriformis;Obturator internus;Lumbar multifidi;Gluteus medius    Gluteus Medius Response  Palpable increased muscle length    Piriformis Response  Palpable increased muscle length    Lumbar multifidi Response  Palpable increased muscle length           PT Education - 10/16/18 1034    Education Details  Discussed importance of HEP, posture at work, and Hotel manager.    Person(s) Educated  Patient    Methods  Explanation    Comprehension  Verbalized understanding          PT Long Term Goals - 09/27/18 1141      PT LONG TERM GOAL #1   Title  independent with HEP    Status  New    Target Date  11/08/18      PT LONG TERM GOAL #2   Title  demo painfree Lumbar ROM WFL    Status  New    Target Date  11/08/18      PT LONG TERM GOAL #3   Title  report ability to sit/stand > 20 min without increase in  pain for improved function and work responsibilities    Status  New    Target Date  11/08/18      PT LONG TERM GOAL #4   Title  demonstrate proper body mechanics to decrease risk of reinjury with work responsibilities    Status  New    Target Date  11/08/18      PT LONG TERM GOAL #5   Title  FOTO score improved to </= 50% limited    Status  New    Target Date  11/08/18            Plan - 10/16/18 1100    Clinical Impression Statement  Pt with prior MRI showing fraying of labrum in L hip. Pt with good preservation of ROM, does have mid pain with FADIR. She has muscle tightness and trigger points in glute, addressed today with dry needling. DN also done for low back. She has significant weakness and instability  in core and will benefit from continued work on this. Pain has been variable in last couple weeks. Pt to benefit from continued care, for L hip and back.    Comorbidities  arthritis, hx of lumbar microdiscectomy and laminectomy, depression, anxiety    Rehab Potential  Good    PT Frequency  2x / week    PT Duration  6 weeks    PT Treatment/Interventions  ADLs/Self Care Home Management;Cryotherapy;Electrical Stimulation;Moist Heat;Traction;Therapeutic exercise;Therapeutic activities;Functional mobility training;Gait training;Stair training;Ultrasound;Patient/family education;Manual techniques;Taping;Dry needling;Spinal Manipulations    PT Next Visit Plan  spine stabilization, manual/ DN as indicated.  further assessment of Lt hip. modalities PRN    PT Home Exercise Plan  Access Code: [email or web address]    Consulted and Agree with Plan of Care  Patient       Patient will benefit from skilled therapeutic intervention in order to improve the following deficits and impairments:  Hypomobility, Increased fascial restricitons,  Increased muscle spasms, Pain, Decreased strength, Decreased range of motion, Impaired flexibility, Postural dysfunction  Visit Diagnosis: 1. Chronic right-sided low  back pain with right-sided sciatica   2. Muscle weakness (generalized)   3. Other muscle spasm   4. Abnormal posture        Problem List Patient Active Problem List   Diagnosis Date Noted  . ANA positive 12/30/2017  . Fibromyalgia 11/03/2017  . Colitis 06/17/2017  . Left ovarian cyst 06/17/2017  . Left shoulder pain 09/07/2016  . Degenerative tear of acetabular labrum of left hip 07/15/2016  . Ganglion cyst 02/17/2016  . Generalized anxiety disorder 02/17/2016  . Recurrent sinusitis 02/17/2016  . Vitamin D deficiency 02/15/2014  . Asthma, chronic 12/14/2012  . GERD (gastroesophageal reflux disease) 12/14/2012  . Seasonal allergies 12/14/2012  . Anxiety and depression 12/14/2012  . Transaminitis 12/14/2012  . Myalgia and myositis 10/30/2012  . Other malaise and fatigue 10/26/2012  . Flushing 10/26/2012  . Insomnia 08/13/2012  . Stress and adjustment reaction 08/13/2012    Lyndee Hensen, PT, DPT 11:08 AM  10/16/18    [facility] [place], [place], [address] Phone: [phone]   Fax:  [phone]  Name: Tracey Patterson MRN: [id number] Date of Birth: 06-11-1980

## 2018-10-19 ENCOUNTER — Ambulatory Visit: Payer: No Typology Code available for payment source | Admitting: Physical Therapy

## 2018-10-19 ENCOUNTER — Other Ambulatory Visit: Payer: Self-pay

## 2018-10-19 DIAGNOSIS — R293 Abnormal posture: Secondary | ICD-10-CM | POA: Diagnosis not present

## 2018-10-19 DIAGNOSIS — M5441 Lumbago with sciatica, right side: Secondary | ICD-10-CM | POA: Diagnosis not present

## 2018-10-19 DIAGNOSIS — G8929 Other chronic pain: Secondary | ICD-10-CM

## 2018-10-19 DIAGNOSIS — M6281 Muscle weakness (generalized): Secondary | ICD-10-CM

## 2018-10-19 DIAGNOSIS — M62838 Other muscle spasm: Secondary | ICD-10-CM

## 2018-10-19 NOTE — Therapy (Signed)
[place], [place], [address] Phone: [phone]   Fax:  [phone]  Physical Therapy Treatment  Patient Details  Name: Tracey Patterson MRN: [id number] Date of Birth: July 18, 1980 Referring Provider (PT): Gregor Hams, MD   Encounter Date: 10/19/2018  PT End of Session - 10/19/18 1613    Visit Number  7    Number of Visits  12    Date for PT Re-Evaluation  11/08/18    PT Start Time  3810    PT Stop Time  1655    PT Time Calculation (min)  50 min       Past Medical History:  Diagnosis Date  . Anxiety   . Asthma   . History of TMJ disorder     Past Surgical History:  Procedure Laterality Date  . BACK SURGERY  December 2015  . KNEE ARTHROSCOPY  1998   left  . LAPAROSCOPIC TUBAL LIGATION  02/28/2012   Procedure: LAPAROSCOPIC TUBAL LIGATION;  Surgeon: Cyril Mourning, MD;  Location: [facility];  Service: Gynecology;  Laterality: Bilateral;  . TONSILLECTOMY      There were no vitals filed for this visit.  Subjective Assessment - 10/19/18 1610    Subjective  "I am getting more flexible and having less freq/ less severe pain".   Tracey Patterson reports the DN last session helped.  She states her pain in low back and hip are variable; relieved mostly with muscle relaxers and pain medicine.    Diagnostic tests  Rt facet joint arthritis and Lt hip arthritis    Patient Stated Goals  improve pain    Currently in Pain?  Yes    Pain Score  5     Pain Location  Hip    Pain Orientation  Left    Pain Descriptors / Indicators  Tightness;Aching    Aggravating Factors   walking; prolonged standing    Pain Relieving Factors  ibuprofen, heat         [facility] PT Assessment - 10/19/18 0001      Assessment   Medical Diagnosis  S39.012A (ICD-10-CM) - Lumbosacral strain, initial encounter    Referring Provider (PT)  Gregor Hams, MD    Onset Date/Surgical Date  --   chronic, recent exacerbation 2-3 weeks ago   Hand Dominance  Right    Next MD Visit  PRN    Prior Therapy  at this clinic ~ 1 year ago       [facility] Adult PT Treatment/Exercise - 10/19/18 0001      Lumbar Exercises: Stretches   Passive Hamstring Stretch  Right;Left;2 reps;20 seconds    Hip Flexor Stretch  Right;Left;2 reps;30 seconds    Press Ups  4 reps   3 sec hold   Piriformis Stretch  Right;Left;2 reps;30 seconds    Piriformis Stretch Limitations  1 rep supine, 1 rep sitting (after [place])    Other Lumbar Stretch Exercise  child's pose x 20 sec, then with lat trunk flexion x 15 sec each direction       Lumbar Exercises: Aerobic   Nustep  L4 (arms/legs): 6 min       Lumbar Exercises: Prone   Opposite Arm/Leg Raise  Right arm/Left leg;Left arm/Right leg;10 reps      Modalities   Modalities  Electrical Stimulation;Ultrasound      Electrical Stimulation   Electrical Stimulation Location  Rt low back/ Lt piriformis     Chartered certified accountant  combo [place]     Electrical Stimulation Parameters  intensity to pt tolerance     Electrical Stimulation Goals  Pain      Ultrasound   Ultrasound Location  see estim    Ultrasound Parameters  combo [place] - 100%, 1.3 w/cm2 x 9 min total.     Ultrasound Goals  Pain   tightness     Manual Therapy   Manual therapy comments  skilled palpation and monitoring of soft tissue during dry needling.     Soft tissue mobilization  DTM to Lt glute med, piriformis and R low lumbar region.        Trigger Point Dry Needling - 10/19/18 1630    Consent Given?  Yes    Education Handout Provided  Previously provided    Muscles Treated Back/Hip  Gluteus medius;Lumbar multifidi    Electrical Stimulation Performed with Dry Needling  Yes    E-stim with Dry Needling Details  Glute med 10 mHz freq to tolerance x 5 min    Gluteus Medius Response  Twitch response elicited;Palpable increased muscle length    Lumbar multifidi Response  Twitch response elicited                PT Long Term Goals -  09/27/18 1141      PT LONG TERM GOAL #1   Title  independent with HEP    Status  New    Target Date  11/08/18      PT LONG TERM GOAL #2   Title  demo painfree Lumbar ROM WFL    Status  New    Target Date  11/08/18      PT LONG TERM GOAL #3   Title  report ability to sit/stand > 20 min without increase in pain for improved function and work responsibilities    Status  New    Target Date  11/08/18      PT LONG TERM GOAL #4   Title  demonstrate proper body mechanics to decrease risk of reinjury with work responsibilities    Status  New    Target Date  11/08/18      PT LONG TERM GOAL #5   Title  FOTO score improved to </= 50% limited    Status  New    Target Date  11/08/18            Plan - 10/19/18 1736    Clinical Impression Statement  Overall, pt reporting less frequent/less intense Rt LBP.  Lt pain persists, and varies dependent on workload.  Trial of prone back/core strengthening initiated.  Pt reported reduction of pain at end of session.  Pt will benefit from continued PT intervention for L hip and low back.    Comorbidities  arthritis, hx of lumbar microdiscectomy and laminectomy, depression, anxiety    Rehab Potential  Good    PT Frequency  2x / week    PT Duration  6 weeks    PT Treatment/Interventions  ADLs/Self Care Home Management;Cryotherapy;Electrical Stimulation;Moist Heat;Traction;Therapeutic exercise;Therapeutic activities;Functional mobility training;Gait training;Stair training;Ultrasound;Patient/family education;Manual techniques;Taping;Dry needling;Spinal Manipulations    PT Next Visit Plan  spine stabilization, manual/ DN as indicated.  progress HEP has tolerated. modalities PRN    PT Home Exercise Plan  Access Code: [email or web address]    Consulted and Agree with Plan of Care  Patient       Patient will benefit from skilled therapeutic intervention in order to improve the following deficits and impairments:  Hypomobility, Increased fascial restricitons,  Increased muscle spasms, Pain, Decreased strength, Decreased range of motion, Impaired flexibility, Postural dysfunction  Visit Diagnosis: 1. Chronic right-sided low back pain with right-sided sciatica   2. Muscle weakness (generalized)   3. Other muscle spasm   4. Abnormal posture        Problem List Patient Active Problem List   Diagnosis Date Noted  . ANA positive 12/30/2017  . Fibromyalgia 11/03/2017  . Colitis 06/17/2017  . Left ovarian cyst 06/17/2017  . Left shoulder pain 09/07/2016  . Degenerative tear of acetabular labrum of left hip 07/15/2016  . Ganglion cyst 02/17/2016  . Generalized anxiety disorder 02/17/2016  . Recurrent sinusitis 02/17/2016  . Vitamin D deficiency 02/15/2014  . Asthma, chronic 12/14/2012  . GERD (gastroesophageal reflux disease) 12/14/2012  . Seasonal allergies 12/14/2012  . Anxiety and depression 12/14/2012  . Transaminitis 12/14/2012  . Myalgia and myositis 10/30/2012  . Other malaise and fatigue 10/26/2012  . Flushing 10/26/2012  . Insomnia 08/13/2012  . Stress and adjustment reaction 08/13/2012   Kerin Perna, PTA 10/19/18 5:45 PM    Laureen Abrahams, PT, DPT 10/20/18 8:18 AM      [facility] [place], [place], [address] Phone: [phone]   Fax:  [phone]  Name: Tracey Patterson MRN: [id number] Date of Birth: Jul 13, 1980

## 2018-10-23 ENCOUNTER — Encounter: Payer: Self-pay | Admitting: Physical Therapy

## 2018-10-23 ENCOUNTER — Other Ambulatory Visit: Payer: Self-pay

## 2018-10-23 ENCOUNTER — Ambulatory Visit (INDEPENDENT_AMBULATORY_CARE_PROVIDER_SITE_OTHER): Payer: No Typology Code available for payment source | Admitting: Physical Therapy

## 2018-10-23 DIAGNOSIS — G8929 Other chronic pain: Secondary | ICD-10-CM

## 2018-10-23 DIAGNOSIS — M62838 Other muscle spasm: Secondary | ICD-10-CM | POA: Diagnosis not present

## 2018-10-23 DIAGNOSIS — M6281 Muscle weakness (generalized): Secondary | ICD-10-CM | POA: Diagnosis not present

## 2018-10-23 DIAGNOSIS — R293 Abnormal posture: Secondary | ICD-10-CM | POA: Diagnosis not present

## 2018-10-23 DIAGNOSIS — M5441 Lumbago with sciatica, right side: Secondary | ICD-10-CM

## 2018-10-24 ENCOUNTER — Encounter: Payer: Self-pay | Admitting: Physical Therapy

## 2018-10-24 NOTE — Therapy (Signed)
[place], [place], [address] Phone: [phone]   Fax:  [phone]  Physical Therapy Treatment  Patient Details  Name: Tracey Patterson MRN: [id number] Date of Birth: 1980/06/12 Referring Provider (PT): Gregor Hams, MD   Encounter Date: 10/23/2018  PT End of Session - 10/23/18 0175    Visit Number  8    Number of Visits  12    Date for PT Re-Evaluation  11/08/18    PT Start Time  1025    PT Stop Time  1515    PT Time Calculation (min)  42 min    Activity Tolerance  Patient tolerated treatment well    Behavior During Therapy  [facility] for tasks assessed/performed       Past Medical History:  Diagnosis Date  . Anxiety   . Asthma   . History of TMJ disorder     Past Surgical History:  Procedure Laterality Date  . BACK SURGERY  December 2015  . KNEE ARTHROSCOPY  1998   left  . LAPAROSCOPIC TUBAL LIGATION  02/28/2012   Procedure: LAPAROSCOPIC TUBAL LIGATION;  Surgeon: Cyril Mourning, MD;  Location: [facility];  Service: Gynecology;  Laterality: Bilateral;  . TONSILLECTOMY      There were no vitals filed for this visit.  Subjective Assessment - 10/23/18 1435    Subjective  Pt states pain has been less frequent and less intense. Still having stiffness in AM and with initial standing.    Currently in Pain?  Yes    Pain Score  4     Pain Location  Hip    Pain Orientation  Left    Pain Descriptors / Indicators  Aching    Pain Type  Chronic pain    Pain Onset  More than a month ago    Pain Frequency  Intermittent    Multiple Pain Sites  Yes    Pain Score  5    Pain Location  Back    Pain Orientation  Right    Pain Descriptors / Indicators  Aching;Tightness    Pain Type  Chronic pain    Pain Onset  More than a month ago    Pain Frequency  Intermittent                       [facility] Adult PT Treatment/Exercise - 10/23/18 1438      Lumbar Exercises: Stretches   Passive  Hamstring Stretch  --    Hip Flexor Stretch  --    Press Ups  15 reps   5 sec hold   Piriformis Stretch  Right;Left;2 reps;30 seconds    Piriformis Stretch Limitations  1 rep supine, 1 rep sitting (after [place])    Other Lumbar Stretch Exercise  child's pose x 20 sec, then with lat trunk flexion x 15 sec each direction       Lumbar Exercises: Aerobic   Stationary Bike  L1x 6    Nustep  --      Lumbar Exercises: Standing   Other Standing Lumbar Exercises  Standing QL stretch x 20 sec each side P);       Lumbar Exercises: Supine   Clam  20 reps    Clam Limitations  GTB and TA/ alternating    Dead Bug  20 reps    Dead Bug Limitations  with GTB at thighs    Bridge  20 reps  Lumbar Exercises: Sidelying   Hip Abduction  20 reps;Both      Lumbar Exercises: Prone   Opposite Arm/Leg Raise  --      Modalities   Modalities  Electrical Stimulation;Ultrasound      Acupuncturist Location  --    Electrical Stimulation Goals  --      Ultrasound   Ultrasound Goals  --      Manual Therapy   Manual therapy comments  skilled palpation and monitoring of soft tissue during dry needling.     Soft tissue mobilization  DTM to Lt glute, piriformis                   PT Long Term Goals - 09/27/18 1141      PT LONG TERM GOAL #1   Title  independent with HEP    Status  New    Target Date  11/08/18      PT LONG TERM GOAL #2   Title  demo painfree Lumbar ROM WFL    Status  New    Target Date  11/08/18      PT LONG TERM GOAL #3   Title  report ability to sit/stand > 20 min without increase in pain for improved function and work responsibilities    Status  New    Target Date  11/08/18      PT LONG TERM GOAL #4   Title  demonstrate proper body mechanics to decrease risk of reinjury with work responsibilities    Status  New    Target Date  11/08/18      PT LONG TERM GOAL #5   Title  FOTO score improved to </= 50% limited    Status  New     Target Date  11/08/18            Plan - 10/24/18 0929    Clinical Impression Statement  Pt with decreasing pain overall. She does have tightness and tenderness at L glute piriformis today, addressed with DN and DTM. Pain in R lumbar region improved from previous visits (at multifidi region), still with some soreness in QL. Pt encouraged to continue Stretching and strenthening, daily for HEP. She has improved ability for TA contraction and requires less cuing with stability exercises today.    Comorbidities  arthritis, hx of lumbar microdiscectomy and laminectomy, depression, anxiety    Rehab Potential  Good    PT Frequency  2x / week    PT Duration  6 weeks    PT Treatment/Interventions  ADLs/Self Care Home Management;Cryotherapy;Electrical Stimulation;Moist Heat;Traction;Therapeutic exercise;Therapeutic activities;Functional mobility training;Gait training;Stair training;Ultrasound;Patient/family education;Manual techniques;Taping;Dry needling;Spinal Manipulations    PT Next Visit Plan  spine stabilization, manual/ DN as indicated.  progress HEP has tolerated. modalities PRN    PT Home Exercise Plan  Access Code: [email or web address]    Consulted and Agree with Plan of Care  Patient       Patient will benefit from skilled therapeutic intervention in order to improve the following deficits and impairments:  Hypomobility, Increased fascial restricitons, Increased muscle spasms, Pain, Decreased strength, Decreased range of motion, Impaired flexibility, Postural dysfunction  Visit Diagnosis: 1. Chronic right-sided low back pain with right-sided sciatica   2. Muscle weakness (generalized)   3. Other muscle spasm   4. Abnormal posture        Problem List Patient Active Problem List   Diagnosis Date Noted  . ANA positive 12/30/2017  . Fibromyalgia 11/03/2017  .  Colitis 06/17/2017  . Left ovarian cyst 06/17/2017  . Left shoulder pain 09/07/2016  . Degenerative tear of acetabular labrum of left  hip 07/15/2016  . Ganglion cyst 02/17/2016  . Generalized anxiety disorder 02/17/2016  . Recurrent sinusitis 02/17/2016  . Vitamin D deficiency 02/15/2014  . Asthma, chronic 12/14/2012  . GERD (gastroesophageal reflux disease) 12/14/2012  . Seasonal allergies 12/14/2012  . Anxiety and depression 12/14/2012  . Transaminitis 12/14/2012  . Myalgia and myositis 10/30/2012  . Other malaise and fatigue 10/26/2012  . Flushing 10/26/2012  . Insomnia 08/13/2012  . Stress and adjustment reaction 08/13/2012   Lyndee Hensen, PT, DPT 9:32 AM  10/24/18    [facility] [place], [place], [address] Phone: [phone]   Fax:  [phone]  Name: Shauntavia Brackin MRN: [id number] Date of Birth: 1981/02/06

## 2018-10-26 ENCOUNTER — Telehealth: Payer: No Typology Code available for payment source | Admitting: Family

## 2018-10-26 DIAGNOSIS — B9689 Other specified bacterial agents as the cause of diseases classified elsewhere: Secondary | ICD-10-CM

## 2018-10-26 DIAGNOSIS — J019 Acute sinusitis, unspecified: Secondary | ICD-10-CM | POA: Diagnosis not present

## 2018-10-26 MED ORDER — AMOXICILLIN-POT CLAVULANATE 875-125 MG PO TABS: 1.0000 | ORAL_TABLET | Freq: Two times a day (BID) | ORAL | 0 refills | Status: DC

## 2018-10-26 MED FILL — AMOX-CLAV 875-125 MG TABLET: 875-125 | 7 days supply | Qty: 14 | Fill #0

## 2018-10-26 NOTE — Progress Notes (Signed)
We are sorry that you are not feeling well.  Here is how we plan to help!  Based on what you have shared with me it looks like you have sinusitis.  Sinusitis is inflammation and infection in the sinus cavities of the head.  Based on your presentation I believe you most likely have Acute Bacterial Sinusitis.  This is an infection caused by bacteria and is treated with antibiotics. I have prescribed Augmentin 822m/125mg one tablet twice daily with food, for 7 days. You may use an oral decongestant such as Mucinex D or if you have glaucoma or high blood pressure use plain Mucinex. Saline nasal spray help and can safely be used as often as needed for congestion.  If you develop worsening sinus pain, fever or notice severe headache and vision changes, or if symptoms are not better after completion of antibiotic, please schedule an appointment with a health care provider.    Sinus infections are not as easily transmitted as other respiratory infection, however we still recommend that you avoid close contact with loved ones, especially the very young and elderly.  Remember to wash your hands thoroughly throughout the day as this is the number one way to prevent the spread of infection!  Home Care:  Only take medications as instructed by your medical team.  Complete the entire course of an antibiotic.  Do not take these medications with alcohol.  A steam or ultrasonic humidifier can help congestion.  You can place a towel over your head and breathe in the steam from hot water coming from a faucet.  Avoid close contacts especially the very young and the elderly.  Cover your mouth when you cough or sneeze.  Always remember to wash your hands.  Get Help Right Away If:  You develop worsening fever or sinus pain.  You develop a severe head ache or visual changes.  Your symptoms persist after you have completed your treatment plan.  Make sure you  Understand these instructions.  Will watch your  condition.  Will get help right away if you are not doing well or get worse.  Your e-visit answers were reviewed by a board certified advanced clinical practitioner to complete your personal care plan.  Depending on the condition, your plan could have included both over the counter or prescription medications.  If there is a problem please reply  once you have received a response from your provider.  Your safety is important to [place]  If you have drug allergies check your prescription carefully.    You can use MyChart to ask questions about today's visit, request a non-urgent call back, or ask for a work or school excuse for 24 hours related to this e-Visit. If it has been greater than 24 hours you will need to follow up with your provider, or enter a new e-Visit to address those concerns.  You will get an e-mail in the next two days asking about your experience.  I hope that your e-visit has been valuable and will speed your recovery. Thank you for using e-visits.   Greater than 5 minutes, yet less than 10 minutes of time have been spent researching, coordinating, and implementing care for this patient today.  Thank you for the details you included in the comment boxes. Those details are very helpful in determining the best course of treatment for you and help [place] provide the best care.

## 2018-10-27 ENCOUNTER — Encounter: Payer: No Typology Code available for payment source | Admitting: Physical Therapy

## 2018-10-30 ENCOUNTER — Other Ambulatory Visit: Payer: Self-pay

## 2018-10-30 ENCOUNTER — Encounter: Payer: Self-pay | Admitting: Physical Therapy

## 2018-10-30 ENCOUNTER — Ambulatory Visit: Payer: No Typology Code available for payment source | Admitting: Physical Therapy

## 2018-10-30 DIAGNOSIS — G8929 Other chronic pain: Secondary | ICD-10-CM

## 2018-10-30 DIAGNOSIS — M5441 Lumbago with sciatica, right side: Secondary | ICD-10-CM | POA: Diagnosis not present

## 2018-10-30 DIAGNOSIS — M62838 Other muscle spasm: Secondary | ICD-10-CM | POA: Diagnosis not present

## 2018-10-30 DIAGNOSIS — M6281 Muscle weakness (generalized): Secondary | ICD-10-CM | POA: Diagnosis not present

## 2018-10-30 DIAGNOSIS — R293 Abnormal posture: Secondary | ICD-10-CM | POA: Diagnosis not present

## 2018-10-30 NOTE — Therapy (Signed)
[place], [place], [address] Phone: [phone]   Fax:  [phone]  Physical Therapy Treatment  Patient Details  Name: Tracey Patterson MRN: [id number] Date of Birth: [birth year] Referring Provider (PT): Gregor Hams, MD   Encounter Date: 10/30/2018  PT End of Session - 10/30/18 0951    Visit Number  9    Number of Visits  12    Date for PT Re-Evaluation  11/08/18    PT Start Time  0934    PT Stop Time  1013    PT Time Calculation (min)  39 min    Activity Tolerance  Patient tolerated treatment well    Behavior During Therapy  [facility] for tasks assessed/performed       Past Medical History:  Diagnosis Date  . Anxiety   . Asthma   . History of TMJ disorder     Past Surgical History:  Procedure Laterality Date  . BACK SURGERY  December 2015  . KNEE ARTHROSCOPY  1998   left  . LAPAROSCOPIC TUBAL LIGATION  02/28/2012   Procedure: LAPAROSCOPIC TUBAL LIGATION;  Surgeon: Cyril Mourning, MD;  Location: [facility];  Service: Gynecology;  Laterality: Bilateral;  . TONSILLECTOMY      There were no vitals filed for this visit.  Subjective Assessment - 10/30/18 0936    Subjective  Pt reports she had a good weekend, mostly painfree.  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  She still feels a pull in her Lt hip when transitional movement.    Currently in Pain?  No/denies    Pain Score  0-No pain         [facility] PT Assessment - 10/30/18 0001      Assessment   Medical Diagnosis  S39.012A (ICD-10-CM) - Lumbosacral strain, initial encounter    Referring Provider (PT)  Gregor Hams, MD    Onset Date/Surgical Date  --   chronic, recent exacerbation 2-3 weeks ago   Hand Dominance  Right    Next MD Visit  PRN    Prior Therapy  at this clinic ~ 1 year ago        [facility] Adult PT Treatment/Exercise - 10/30/18 0001      Lumbar Exercises: Stretches   Passive Hamstring Stretch  Left;2 reps;30 seconds    Hip Flexor Stretch  Left;1 rep;20 seconds    Press Ups  5 reps   5 sec hold   Piriformis Stretch  Right;Left;2 reps;30 seconds    Piriformis Stretch Limitations  plus modified pigeon pose x 2 reps of 20 sec.       Lumbar Exercises: Aerobic   Nustep  L4 (arms/legs): 6 min       Lumbar Exercises: Standing   Other Standing Lumbar Exercises  Standing QL stretch x 20 sec each side, arms on door frame x 2 reps        Lumbar Exercises: Supine   Clam  20 reps    Clam Limitations  GTB and TA    Bridge  15 reps   with green band on thighs   Bridge Limitations  initially painful, eased some with repetition      Lumbar Exercises: Sidelying   Clam  Left;Right;10 reps;2 seconds    Clam Limitations  --    Hip Abduction  Right;Left;10 reps;3 seconds      Manual Therapy   Manual Therapy  Myofascial release;Soft tissue mobilization    Soft tissue mobilization  DTM to Lt glute, piriformis     Myofascial Release  MFR to Lt glute                  PT Long Term Goals - 09/27/18 1141      PT LONG TERM GOAL #1   Title  independent with HEP    Status  New    Target Date  11/08/18      PT LONG TERM GOAL #2   Title  demo painfree Lumbar ROM WFL    Status  New    Target Date  11/08/18      PT LONG TERM GOAL #3   Title  report ability to sit/stand > 20 min without increase in pain for improved function and work responsibilities    Status  New    Target Date  11/08/18      PT LONG TERM GOAL #4   Title  demonstrate proper body mechanics to decrease risk of reinjury with work responsibilities    Status  New    Target Date  11/08/18      PT LONG TERM GOAL #5   Title  FOTO score improved to </= 50% limited    Status  New    Target Date  11/08/18            Plan - 10/30/18 0951    Clinical Impression Statement  Pt can now sit 15  min prior to increased pain; no limitation with standing; progressing towards LTG#3. Pt has had a few days with minimal to no pain.  She had some mild increase in pain in Lt hip after completing LE/core exercises and DTM to Lt hip; discomfort eased some with stretches.  Pt making good gains each visit.    Rehab Potential  Good    PT Frequency  2x / week    PT Duration  6 weeks    PT Treatment/Interventions  ADLs/Self Care Home Management;Cryotherapy;Electrical Stimulation;Moist Heat;Traction;Therapeutic exercise;Therapeutic activities;Functional mobility training;Gait training;Stair training;Ultrasound;Patient/family education;Manual techniques;Taping;Dry needling;Spinal Manipulations    PT Next Visit Plan  add standing core work (TA with sit to stand, dead lift, rowing).    PT Home Exercise Plan  Access Code: [email or web address]    Consulted and Agree with Plan of Care  Patient       Patient will benefit from skilled therapeutic intervention in order to improve the following deficits and impairments:  Hypomobility, Increased fascial restricitons, Increased muscle spasms, Pain, Decreased strength, Decreased range of motion, Impaired flexibility, Postural dysfunction  Visit Diagnosis: 1. Chronic right-sided low back pain with right-sided sciatica   2. Muscle weakness (generalized)   3. Other muscle spasm   4. Abnormal posture        Problem List Patient Active Problem List   Diagnosis Date Noted  . ANA positive 12/30/2017  . Fibromyalgia 11/03/2017  . Colitis 06/17/2017  . Left ovarian cyst 06/17/2017  . Left shoulder pain 09/07/2016  . Degenerative tear of acetabular labrum of left hip 07/15/2016  . Ganglion cyst 02/17/2016  . Generalized anxiety disorder 02/17/2016  . Recurrent sinusitis 02/17/2016  . Vitamin D deficiency 02/15/2014  . Asthma, chronic 12/14/2012  . GERD (gastroesophageal reflux disease) 12/14/2012  . Seasonal allergies 12/14/2012  . Anxiety and depression 12/14/2012  .  Transaminitis 12/14/2012  . Myalgia and myositis 10/30/2012  . Other  malaise and fatigue 10/26/2012  . Flushing 10/26/2012  . Insomnia 08/13/2012  . Stress and adjustment reaction 08/13/2012   Kerin Perna, PTA 10/30/18 10:36 AM  [facility] [place], [place], [address] Phone: [phone]   Fax:  [phone]  Name: Elvi Leventhal MRN: [id number] Date of Birth: [birth year]

## 2018-11-02 MED FILL — PANTOPRAZOLE SOD DR 40 MG T: 40 | 90 days supply | Qty: 180 | Fill #2

## 2018-11-03 ENCOUNTER — Encounter: Payer: No Typology Code available for payment source | Admitting: Physical Therapy

## 2018-11-03 ENCOUNTER — Other Ambulatory Visit: Payer: Self-pay

## 2018-11-03 ENCOUNTER — Ambulatory Visit: Payer: No Typology Code available for payment source | Admitting: Physical Therapy

## 2018-11-03 DIAGNOSIS — G8929 Other chronic pain: Secondary | ICD-10-CM

## 2018-11-03 DIAGNOSIS — M6281 Muscle weakness (generalized): Secondary | ICD-10-CM | POA: Diagnosis not present

## 2018-11-03 DIAGNOSIS — M62838 Other muscle spasm: Secondary | ICD-10-CM

## 2018-11-03 DIAGNOSIS — M5441 Lumbago with sciatica, right side: Secondary | ICD-10-CM | POA: Diagnosis not present

## 2018-11-03 NOTE — Patient Instructions (Signed)
Bird Dog Pose - Beginner    Keep pelvis stable. From table pose, step one leg back to plank pose. Reach opposite arm forward. Hold for __3__ breaths. Return arm and leg at same rate. Repeat _5-10___ times, alternating sides.   Abduction: "Doggie Ambulance person" (Eccentric) - All 4's    On hands and knees, lift knee out to side. Slowly lower for 3-5 seconds. __5-10_ reps per set, _1__ sets per day, _3-4__ days per week.   Bridge Pose, One Leg    Bring ankle to opposite thigh (figure 4). Roll up from tailbone to bridge pose on supporting leg. Focus on engaging posterior hip muscles. Hold for __2__ breaths. Repeat __5-10__ times each leg.  Resistive Band Rowing   With resistive band anchored in door, grasp both ends. Keeping elbows bent, pull back, squeezing shoulder blades together. Hold _3-5___ seconds. Repeat _10___ times. Do __1__ sessions per day., 3-4 days per week.   Strengthening: Resisted Extension   Hold tubing with both hands, arms forward  Pull in abdomen in. Pull arms down to side, elbows straight. Repeat _10__ times per set. Do __1-2__ sets per session. Do _1___ sessions per day, 3-4 days per week.

## 2018-11-03 NOTE — Therapy (Signed)
[place], [place], [address] Phone: [phone]   Fax:  [phone]  Physical Therapy Treatment  Patient Details  Name: Tracey Patterson MRN: [id number] Date of Birth: [birth year] Referring Provider (PT): Gregor Hams, MD   Encounter Date: 11/03/2018  PT End of Session - 11/03/18 1405    Visit Number  10    Number of Visits  12    Date for PT Re-Evaluation  11/08/18    PT Start Time  1402    PT Stop Time  1443    PT Time Calculation (min)  41 min       Past Medical History:  Diagnosis Date  . Anxiety   . Asthma   . History of TMJ disorder     Past Surgical History:  Procedure Laterality Date  . BACK SURGERY  December 2015  . KNEE ARTHROSCOPY  1998   left  . LAPAROSCOPIC TUBAL LIGATION  02/28/2012   Procedure: LAPAROSCOPIC TUBAL LIGATION;  Surgeon: Cyril Mourning, MD;  Location: [facility];  Service: Gynecology;  Laterality: Bilateral;  . TONSILLECTOMY      There were no vitals filed for this visit.  Subjective Assessment - 11/03/18 1405    Subjective  Pt reports she used heat and TENS after last session and this calmed her hip down. Her pain remains less intense and less frequent.  She has not taken baclofen in 2 wks and hasn't taken pain medicine in over a wk.  She now only has occassional twinges at work that are resolved with seated rest and stretches.    Diagnostic tests  Rt facet joint arthritis and Lt hip arthritis    Patient Stated Goals  improve pain    Currently in Pain?  No/denies    Pain Score  0-No pain         [facility] PT Assessment - 11/03/18 0001      Assessment   Medical Diagnosis  S39.012A (ICD-10-CM) - Lumbosacral strain, initial encounter    Referring Provider (PT)  Gregor Hams, MD    Onset Date/Surgical Date  --   chronic, recent exacerbation 2-3 weeks ago   Hand Dominance  Right    Next MD Visit  PRN    Prior Therapy  at this clinic ~ 1 year ago         [facility] Adult PT Treatment/Exercise - 11/03/18 0001      Lumbar Exercises: Stretches   Passive Hamstring Stretch  Right;Left;2 reps;30 seconds    Piriformis Stretch  Right;Left;3 reps;30 seconds   throughout session   Other Lumbar Stretch Exercise  childs pose x 2 reps of 15 sec       Lumbar Exercises: Aerobic   Nustep  L5 (arms/legs): 5 min       Lumbar Exercises: Standing   Wall Slides Limitations  5x in/out from core with 5# x 3 reps of wall slide    Row  Strengthening;Both;10 reps    Theraband Level (Row)  Level 3 (Green)    Shoulder Extension  Strengthening;Both;10 reps;Theraband   2 sets, one sitting and one standing   Theraband Level (Shoulder Extension)  Level 3 (Green)      Lumbar Exercises: Supine   Single Leg Bridge  5 reps      Lumbar Exercises: Quadruped   Madcat/Old Horse  5 reps    Opposite Arm/Leg Raise  Right arm/Left leg;Left arm/Right leg;5 reps;3 seconds    Plank  modified  plank with hands on counter, and trial of modified plank x 10 sec each (some bothered wrists)    Other Quadruped Lumbar Exercises  fire hydrants x 10 reps each side (some cramping in Lt hip after RLE lifts)      Modalities   Modalities  --   declined; will use at home.      Manual Therapy   Manual Therapy  --   declined; will do self massage at home if needed.             PT Education - 11/03/18 1442    Education Details  HEP    Person(s) Educated  Patient    Methods  Explanation;Demonstration;Verbal cues;Handout    Comprehension  Verbalized understanding;Returned demonstration          PT Long Term Goals - 11/03/18 1414      PT LONG TERM GOAL #1   Title  independent with HEP    Time  6    Period  Weeks    Status  On-going      PT LONG TERM GOAL #2   Title  demo painfree Lumbar ROM WFL    Time  6    Period  Weeks    Status  On-going      PT LONG TERM GOAL #3   Title  report ability to sit/stand > 20 min without increase in pain for improved function and  work responsibilities    Time  6    Period  Weeks    Status  Achieved      PT LONG TERM GOAL #4   Title  demonstrate proper body mechanics to decrease risk of reinjury with work responsibilities    Time  6    Period  Weeks    Status  On-going      PT LONG TERM GOAL #5   Title  FOTO score improved to </= 50% limited    Time  6    Period  Weeks    Status  On-going            Plan - 11/03/18 1448    Clinical Impression Statement  Much improved sitting/standing tolerance reported; has met LTG #3.  She tolerated all exercises well, without increase in pain, just fatigue in Lt hip.  Incorporated new core /hip strengthening into HEP.  Progressing towards goals.    Rehab Potential  Good    PT Frequency  2x / week    PT Duration  6 weeks    PT Treatment/Interventions  ADLs/Self Care Home Management;Cryotherapy;Electrical Stimulation;Moist Heat;Traction;Therapeutic exercise;Therapeutic activities;Functional mobility training;Gait training;Stair training;Ultrasound;Patient/family education;Manual techniques;Taping;Dry needling;Spinal Manipulations    PT Next Visit Plan  possible DN to hip/low back.  Assess goals and readiness to hold vs d/c therapy. FOTO?   PT Home Exercise Plan  Access Code: [email or web address]    Consulted and Agree with Plan of Care  Patient       Patient will benefit from skilled therapeutic intervention in order to improve the following deficits and impairments:  Hypomobility, Increased fascial restricitons, Increased muscle spasms, Pain, Decreased strength, Decreased range of motion, Impaired flexibility, Postural dysfunction  Visit Diagnosis: 1. Chronic right-sided low back pain with right-sided sciatica   2. Muscle weakness (generalized)   3. Other muscle spasm        Problem List Patient Active Problem List   Diagnosis Date Noted  . ANA positive 12/30/2017  . Fibromyalgia 11/03/2017  . Colitis 06/17/2017  . Left ovarian cyst  06/17/2017  . Left shoulder pain  09/07/2016  . Degenerative tear of acetabular labrum of left hip 07/15/2016  . Ganglion cyst 02/17/2016  . Generalized anxiety disorder 02/17/2016  . Recurrent sinusitis 02/17/2016  . Vitamin D deficiency 02/15/2014  . Asthma, chronic 12/14/2012  . GERD (gastroesophageal reflux disease) 12/14/2012  . Seasonal allergies 12/14/2012  . Anxiety and depression 12/14/2012  . Transaminitis 12/14/2012  . Myalgia and myositis 10/30/2012  . Other malaise and fatigue 10/26/2012  . Flushing 10/26/2012  . Insomnia 08/13/2012  . Stress and adjustment reaction 08/13/2012   Kerin Perna, PTA 11/03/18 3:02 PM  [place], [place], [address] Phone: [phone]   Fax:  [phone]  Name: Tracey Patterson MRN: [id number] Date of Birth: 03-20-1981

## 2018-11-06 ENCOUNTER — Encounter: Payer: Self-pay | Admitting: Physical Therapy

## 2018-11-06 ENCOUNTER — Other Ambulatory Visit: Payer: Self-pay

## 2018-11-06 ENCOUNTER — Ambulatory Visit (INDEPENDENT_AMBULATORY_CARE_PROVIDER_SITE_OTHER): Payer: No Typology Code available for payment source | Admitting: Physical Therapy

## 2018-11-06 DIAGNOSIS — M5441 Lumbago with sciatica, right side: Secondary | ICD-10-CM | POA: Diagnosis not present

## 2018-11-06 DIAGNOSIS — M6281 Muscle weakness (generalized): Secondary | ICD-10-CM | POA: Diagnosis not present

## 2018-11-06 DIAGNOSIS — R293 Abnormal posture: Secondary | ICD-10-CM | POA: Diagnosis not present

## 2018-11-06 DIAGNOSIS — G8929 Other chronic pain: Secondary | ICD-10-CM

## 2018-11-06 DIAGNOSIS — M62838 Other muscle spasm: Secondary | ICD-10-CM | POA: Diagnosis not present

## 2018-11-06 NOTE — Therapy (Signed)
[place], [place], [address] Phone: [phone]   Fax:  [phone]  Physical Therapy Treatment  Patient Details  Name: Tracey Patterson MRN: [id number] Date of Birth: [birth year] Referring Provider (PT): Gregor Hams, MD   Encounter Date: 11/06/2018  PT End of Session - 11/06/18 1427    Visit Number  11    Number of Visits  12    Date for PT Re-Evaluation  11/08/18    PT Start Time  9833    PT Stop Time  1425    PT Time Calculation (min)  40 min    Activity Tolerance  Patient tolerated treatment well    Behavior During Therapy  [facility] for tasks assessed/performed       Past Medical History:  Diagnosis Date  . Anxiety   . Asthma   . History of TMJ disorder     Past Surgical History:  Procedure Laterality Date  . BACK SURGERY  December 2015  . KNEE ARTHROSCOPY  1998   left  . LAPAROSCOPIC TUBAL LIGATION  02/28/2012   Procedure: LAPAROSCOPIC TUBAL LIGATION;  Surgeon: Cyril Mourning, MD;  Location: [facility];  Service: Gynecology;  Laterality: Bilateral;  . TONSILLECTOMY      There were no vitals filed for this visit.  Subjective Assessment - 11/06/18 1348    Subjective  feels like she's getting better.  no pain meds for 1-2 weeks.    Diagnostic tests  Rt facet joint arthritis and Lt hip arthritis    Patient Stated Goals  improve pain    Currently in Pain?  No/denies         [facility] PT Assessment - 11/06/18 1406      Assessment   Medical Diagnosis  S39.012A (ICD-10-CM) - Lumbosacral strain, initial encounter    Referring Provider (PT)  Gregor Hams, MD      Observation/Other Assessments   Focus on Therapeutic Outcomes (FOTO)   69 (31% limited)                   [facility] Adult PT Treatment/Exercise - 11/06/18 1349      Lumbar Exercises: Stretches   Passive Hamstring Stretch  Right;Left;2 reps;30 seconds    Piriformis Stretch  Right;Left;2 reps;30 seconds    Other  Lumbar Stretch Exercise  childs pose x 2 reps of 15 sec     Other Lumbar Stretch Exercise  standing mid back stretch 3x20 sec      Lumbar Exercises: Aerobic   Nustep  L5 (arms/legs): 5 min       Lumbar Exercises: Standing   Heel Raises  10 reps    Heel Raises Limitations  off step    Functional Squats  10 reps    Functional Squats Limitations  holding 5# weight    Row  Strengthening;Both;10 reps    Theraband Level (Row)  Level 3 (Green)    Shoulder Extension  Strengthening;Both;10 reps;Theraband    Theraband Level (Shoulder Extension)  Level 3 (Green)    Other Standing Lumbar Exercises  deadlift x10 reps; 5# weight      Lumbar Exercises: Supine   Bent Knee Raise  10 reps    Bent Knee Raise Limitations  with ab set    Single Leg Bridge  10 reps   figure-4 (2x5 reps)     Lumbar Exercises: Quadruped   Opposite Arm/Leg Raise  Right arm/Left leg;Left arm/Right leg;3 seconds;10 reps    Other Quadruped  Lumbar Exercises  fire hydrants x 10 reps each side (some cramping in Lt hip after RLE lifts)                  PT Long Term Goals - 11/06/18 1428      PT LONG TERM GOAL #1   Title  independent with HEP    Time  6    Period  Weeks    Status  On-going      PT LONG TERM GOAL #2   Title  demo painfree Lumbar ROM WFL    Time  6    Period  Weeks    Status  On-going      PT LONG TERM GOAL #3   Title  report ability to sit/stand > 20 min without increase in pain for improved function and work responsibilities    Time  6    Period  Weeks    Status  Achieved      PT LONG TERM GOAL #4   Title  demonstrate proper body mechanics to decrease risk of reinjury with work responsibilities    Time  6    Period  Weeks    Status  On-going      PT LONG TERM GOAL #5   Title  FOTO score improved to </= 50% limited    Time  6    Period  Weeks    Status  Achieved            Plan - 11/06/18 1428    Clinical Impression Statement  Pt tolerated strengthening exercises well  today and anticipate pt will be ready for d/c next visit.  FOTO goal met.    Rehab Potential  Good    PT Frequency  2x / week    PT Duration  6 weeks    PT Treatment/Interventions  ADLs/Self Care Home Management;Cryotherapy;Electrical Stimulation;Moist Heat;Traction;Therapeutic exercise;Therapeutic activities;Functional mobility training;Gait training;Stair training;Ultrasound;Patient/family education;Manual techniques;Taping;Dry needling;Spinal Manipulations    PT Next Visit Plan  possible DN to hip/low back.  Assess goals and readiness to hold vs d/c therapy.    PT Home Exercise Plan  Access Code: [email or web address]    Consulted and Agree with Plan of Care  Patient       Patient will benefit from skilled therapeutic intervention in order to improve the following deficits and impairments:  Hypomobility, Increased fascial restricitons, Increased muscle spasms, Pain, Decreased strength, Decreased range of motion, Impaired flexibility, Postural dysfunction  Visit Diagnosis: 1. Chronic right-sided low back pain with right-sided sciatica   2. Muscle weakness (generalized)   3. Other muscle spasm   4. Abnormal posture        Problem List Patient Active Problem List   Diagnosis Date Noted  . ANA positive 12/30/2017  . Fibromyalgia 11/03/2017  . Colitis 06/17/2017  . Left ovarian cyst 06/17/2017  . Left shoulder pain 09/07/2016  . Degenerative tear of acetabular labrum of left hip 07/15/2016  . Ganglion cyst 02/17/2016  . Generalized anxiety disorder 02/17/2016  . Recurrent sinusitis 02/17/2016  . Vitamin D deficiency 02/15/2014  . Asthma, chronic 12/14/2012  . GERD (gastroesophageal reflux disease) 12/14/2012  . Seasonal allergies 12/14/2012  . Anxiety and depression 12/14/2012  . Transaminitis 12/14/2012  . Myalgia and myositis 10/30/2012  . Other malaise and fatigue 10/26/2012  . Flushing 10/26/2012  . Insomnia 08/13/2012  . Stress and adjustment reaction 08/13/2012      Laureen Abrahams, PT, DPT 11/06/18 2:30 PM  [place], [place], [address] Phone: [phone]   Fax:  [phone]  Name: Tracey Patterson MRN: [id number] Date of Birth: [birth year]

## 2018-11-10 ENCOUNTER — Ambulatory Visit (INDEPENDENT_AMBULATORY_CARE_PROVIDER_SITE_OTHER): Payer: No Typology Code available for payment source | Admitting: Physical Therapy

## 2018-11-10 ENCOUNTER — Other Ambulatory Visit: Payer: Self-pay

## 2018-11-10 DIAGNOSIS — M5441 Lumbago with sciatica, right side: Secondary | ICD-10-CM

## 2018-11-10 DIAGNOSIS — M62838 Other muscle spasm: Secondary | ICD-10-CM

## 2018-11-10 DIAGNOSIS — R293 Abnormal posture: Secondary | ICD-10-CM | POA: Diagnosis not present

## 2018-11-10 DIAGNOSIS — M6281 Muscle weakness (generalized): Secondary | ICD-10-CM | POA: Diagnosis not present

## 2018-11-10 DIAGNOSIS — G8929 Other chronic pain: Secondary | ICD-10-CM

## 2018-11-10 NOTE — Therapy (Addendum)
[place], [place], [address] Phone: [phone]   Fax:  [phone]  Physical Therapy Treatment/Discharge  Patient Details  Name: Tracey Patterson MRN: [id number] Date of Birth: June 20, 1980 Referring Provider (PT): Gregor Hams, MD   Encounter Date: 11/10/2018  PT End of Session - 11/10/18 1406    Visit Number  12    Number of Visits  12    Date for PT Re-Evaluation  11/08/18    PT Start Time  1401    PT Stop Time  1436    PT Time Calculation (min)  35 min    Activity Tolerance  Patient tolerated treatment well;No increased pain    Behavior During Therapy  WFL for tasks assessed/performed       Past Medical History:  Diagnosis Date  . Anxiety   . Asthma   . History of TMJ disorder     Past Surgical History:  Procedure Laterality Date  . BACK SURGERY  December 2015  . KNEE ARTHROSCOPY  1998   left  . LAPAROSCOPIC TUBAL LIGATION  02/28/2012   Procedure: LAPAROSCOPIC TUBAL LIGATION;  Surgeon: Cyril Mourning, MD;  Location: [facility];  Service: Gynecology;  Laterality: Bilateral;  . TONSILLECTOMY      There were no vitals filed for this visit.  Subjective Assessment - 11/10/18 1406    Subjective  Pt reports she is still pain free.  continues to perform HEP daily.    Pertinent History  back surgery L4/5 microdiscectomy and laminectomy    Currently in Pain?  No/denies    Pain Score  0-No pain         [facility] PT Assessment - 11/10/18 0001      Assessment   Medical Diagnosis  S39.012A (ICD-10-CM) - Lumbosacral strain, initial encounter    Referring Provider (PT)  Gregor Hams, MD    Next MD Visit  PRN      AROM   Lumbar Flexion  WNL   with stretch feeling to LB   Lumbar Extension  WNL    Lumbar - Right Side Bend  WNL    Lumbar - Left Side Bend  WNL    Lumbar - Right Rotation  WNL    Lumbar - Left Rotation  WNL         [facility] Adult PT Treatment/Exercise - 11/10/18 0001      Lumbar Exercises: Stretches   Passive Hamstring Stretch  Left;Right;1 rep;30 seconds   seated   Single Knee to Chest Stretch  Right;Left;2 reps;20 seconds    Hip Flexor Stretch  Right;Left;1 rep;20 seconds    Standing Extension  1 rep;5 seconds    Piriformis Stretch  Right;Left;30 seconds;4 reps   throughout session   Other Lumbar Stretch Exercise  childs pose x 2 reps of 15 sec     Other Lumbar Stretch Exercise  standing mid back stretch 3x20 sec      Lumbar Exercises: Aerobic   Nustep  L5 (arms/legs): 5 min       Lumbar Exercises: Standing   Wall Slides Limitations  5x in/out from core with 2# x 5 reps of wall slide    Row  Strengthening;Both;20 reps;Theraband    Theraband Level (Row)  Level 3 (Green)    Other Standing Lumbar Exercises  deadlift x10 reps; 0# weight    Other Standing Lumbar Exercises  reverse wall pushups x 5 reps;  Standing hip isometric against wall (per HEP) x 5  sec x 5 reps each side.       Lumbar Exercises: Supine   Bridge  10 reps    Bridge with March  --   1 rep trial   Single Leg Bridge  10 reps   figure-4 (2x5 reps)                 PT Long Term Goals - 11/10/18 1419      PT LONG TERM GOAL #1   Title  independent with HEP    Time  6    Period  Weeks    Status  Achieved      PT LONG TERM GOAL #2   Title  demo painfree Lumbar ROM WFL    Time  6    Period  Weeks    Status  Achieved      PT LONG TERM GOAL #3   Title  report ability to sit/stand > 20 min without increase in pain for improved function and work responsibilities    Time  6    Period  Weeks    Status  Achieved      PT LONG TERM GOAL #4   Title  demonstrate proper body mechanics to decrease risk of reinjury with work responsibilities    Time  6    Period  Weeks    Status  Achieved      PT LONG TERM GOAL #5   Title  FOTO score improved to </= 50% limited    Time  6    Period  Weeks    Status  Achieved            Plan - 11/10/18 1440    Clinical Impression  Statement  Pt requested HEP she can perform at work; pt issued new HEP for this purpose.  She tolerated all new exercises well without increase in symptoms.  Pt has met all goals and is agreeable to d/c.    Rehab Potential  Good    PT Frequency  2x / week    PT Duration  6 weeks    PT Treatment/Interventions  ADLs/Self Care Home Management;Cryotherapy;Electrical Stimulation;Moist Heat;Traction;Therapeutic exercise;Therapeutic activities;Functional mobility training;Gait training;Stair training;Ultrasound;Patient/family education;Manual techniques;Taping;Dry needling;Spinal Manipulations    PT Next Visit Plan  spoke to supervising PT; will d/c at this time.    PT Home Exercise Plan  Access Code: [email or web address]    Consulted and Agree with Plan of Care  Patient       Patient will benefit from skilled therapeutic intervention in order to improve the following deficits and impairments:  Hypomobility, Increased fascial restricitons, Increased muscle spasms, Pain, Decreased strength, Decreased range of motion, Impaired flexibility, Postural dysfunction  Visit Diagnosis: 1. Chronic right-sided low back pain with right-sided sciatica   2. Muscle weakness (generalized)   3. Other muscle spasm   4. Abnormal posture        Problem List Patient Active Problem List   Diagnosis Date Noted  . ANA positive 12/30/2017  . Fibromyalgia 11/03/2017  . Colitis 06/17/2017  . Left ovarian cyst 06/17/2017  . Left shoulder pain 09/07/2016  . Degenerative tear of acetabular labrum of left hip 07/15/2016  . Ganglion cyst 02/17/2016  . Generalized anxiety disorder 02/17/2016  . Recurrent sinusitis 02/17/2016  . Vitamin D deficiency 02/15/2014  . Asthma, chronic 12/14/2012  . GERD (gastroesophageal reflux disease) 12/14/2012  . Seasonal allergies 12/14/2012  . Anxiety and depression 12/14/2012  . Transaminitis 12/14/2012  . Myalgia and myositis 10/30/2012  .  Other malaise and fatigue 10/26/2012  .  Flushing 10/26/2012  . Insomnia 08/13/2012  . Stress and adjustment reaction 08/13/2012   Kerin Perna, PTA 11/10/18 2:50 PM  [place], [place], [address] Phone: [phone]   Fax:  [phone]  Name: Tracey Patterson MRN: [id number] Date of Birth: [birth year]      PHYSICAL THERAPY DISCHARGE SUMMARY  Visits from Start of Care: 12  Current functional level related to goals / functional outcomes: See above   Remaining deficits: See above   Education / Equipment: HEP  Plan: Patient agrees to discharge.  Patient goals were met. Patient is being discharged due to meeting the stated rehab goals.  ?????     Laureen Abrahams, PT, DPT 11/13/18 7:21 AM  [facility] [place], [place] [address]  [phone] (office) [phone] (fax)

## 2018-11-13 ENCOUNTER — Encounter: Payer: Self-pay | Admitting: Physical Therapy

## 2018-12-11 ENCOUNTER — Other Ambulatory Visit: Payer: Self-pay | Admitting: Osteopathic Medicine

## 2018-12-11 MED FILL — buPROPion HCL ER (XL) 300 M: 300 | 90 days supply | Qty: 90 | Fill #1

## 2018-12-18 ENCOUNTER — Encounter: Payer: Self-pay | Admitting: Family Medicine

## 2018-12-18 ENCOUNTER — Ambulatory Visit (INDEPENDENT_AMBULATORY_CARE_PROVIDER_SITE_OTHER): Payer: No Typology Code available for payment source | Admitting: Family Medicine

## 2018-12-18 ENCOUNTER — Ambulatory Visit (INDEPENDENT_AMBULATORY_CARE_PROVIDER_SITE_OTHER): Payer: No Typology Code available for payment source

## 2018-12-18 ENCOUNTER — Other Ambulatory Visit: Payer: Self-pay

## 2018-12-18 VITALS — BP 113/79 | HR 91 | Temp 98.0°F | Wt 199.0 lb

## 2018-12-18 DIAGNOSIS — M79675 Pain in left toe(s): Secondary | ICD-10-CM | POA: Diagnosis not present

## 2018-12-18 DIAGNOSIS — F5101 Primary insomnia: Secondary | ICD-10-CM | POA: Diagnosis not present

## 2018-12-18 DIAGNOSIS — M797 Fibromyalgia: Secondary | ICD-10-CM | POA: Diagnosis not present

## 2018-12-18 MED ORDER — AMITRIPTYLINE HCL 50 MG PO TABS: 50.0000 mg | ORAL_TABLET | Freq: Every evening | ORAL | 2 refills | Status: DC | PRN

## 2018-12-18 MED ORDER — DICLOFENAC SODIUM 1 % TD GEL: 4.0000 g | Freq: Four times a day (QID) | TRANSDERMAL | 11 refills | Status: DC

## 2018-12-18 MED FILL — DICLOFENAC SODIUM 1 % GEL: 1 | 6 days supply | Qty: 100 | Fill #0

## 2018-12-18 MED FILL — AMITRIPTYLINE HCL 50 MG TAB: 50 | 90 days supply | Qty: 180 | Fill #0

## 2018-12-18 NOTE — Patient Instructions (Signed)
Thank you for coming in today. If xray and Uric acid support Hallux Rigidus then get over the counter carbon fiber spring plate.  Use diclofenac gel.  Get xray and labs.  Recheck if not better.    Hallux Rigidus  Hallux rigidus is a type of joint pain or joint disease (arthritis) that affects your big toe (hallux). This condition involves the joint that connects the base of your big toe to the main part of your foot (metatarsophalangeal joint or MTP joint). This condition can cause your big toe to become stiff, painful, and difficult to move. Symptoms may get worse with movement or in cold or damp weather. The condition gets worse over time. What are the causes? This condition may be caused by having a foot that does not function the way that it should or that has an abnormal shape (structural deformity). These foot problems can run in families and may be passed down from parents to children (are hereditary). This condition can also be caused by:  Injury.  Overuse.  Certain inflammatory diseases, including gout and rheumatoid arthritis. What increases the risk? You are more likely to develop this condition if you have:  A foot bone (metatarsal) that is longer or higher than normal.  A family history of hallux rigidus.  Previously injured your big toe.  Feet that do not have a curve (arch) on the inner side of the foot. This may be called flat feet or fallen arches.  Ankles that turn in when you walk (pronation).  Rheumatoid arthritis or gout.  A job that requires you to stoop down often at work. What are the signs or symptoms? Symptoms of this condition include:  Big toe pain.  Stiffness and difficulty moving the big toe.  Swelling of the toe and surrounding area.  Bone spurs. These are bony growths that can form on the joint of the big toe.  A limp. How is this diagnosed? This condition is diagnosed based on your medical history and a physical exam. You may also have  X-rays. How is this treated? This condition is treated by:  Wearing roomy, comfortable shoes that have a large toe box.  Putting orthotic devices in your shoes.  Taking pain medicines.  Having physical therapy.  Icing the injured area.  Alternating between putting your foot in cold water and then in warm water. If your condition is severe, treatment may include:  Corticosteroid injections to relieve pain.  Surgery to remove bone spurs, fuse damaged bones together, or replace the entire joint. Follow these instructions at home: Managing pain, stiffness, and swelling   Put your feet in cold water for 30 seconds, and then in warm water for 30 seconds. Alternate between the cold and warm water for 5 minutes. Do this several times a day or as told by your health care provider.  If directed, put ice on the injured area. ? Put ice in a plastic bag. ? Place a towel between your skin and the bag. ? Leave the ice on for 20 minutes, 2-3 times a day. General instructions  Take over-the-counter and prescription medicines only as told by your health care provider.  Do not wear high heels or other restrictive footwear. Wear comfortable, supportive shoes that have a large toe box.  Wear shoe inserts (orthotics) as told by your health care provider, if this applies.  Do foot exercises as instructed by your health care provider or a physical therapist.  Keep all follow-up visits as told by  your health care provider. This is important. Contact a health care provider if:  You notice bone spurs or growths on or around your big toe.  Your pain does not get better or it gets worse.  You have pain while resting.  You have pain in other parts of your body, such as your back, hip, or knee.  You start to limp. Summary  Hallux rigidus is a condition that makes your big toe become stiff, painful, and difficult to move.  It can be caused by injury, overuse, or inflammatory diseases.  This  condition may be treated with ice, medicines, physical therapy, and surgery.  Do not wear high heels or other restrictive footwear. Wear comfortable, supportive shoes that have a large toe box. This information is not intended to replace advice given to you by your health care provider. Make sure you discuss any questions you have with your health care provider. Document Released: 03/15/2005 Document Revised: 12/23/2017 Document Reviewed: 12/26/2017 Elsevier Patient Education  2020 [employer].

## 2018-12-18 NOTE — Progress Notes (Signed)
Tracey Patterson is a 38 y.o. female who presents to [facility]: [facility] today for left 1st toe pain, refill   Left 1st Toe Pain - Patient states that the pain onset three months ago. She denies any injury or trauma to the area. She states the pain does not last all day and comes and goes. The pain is primarily located at the base of her MTP joint and the pain is worse when dorsiflexing the toe. After a full night at work on her feet, the pain is at its worst at a 5/10 in severity. She states that tennis shoes make the pain better and flip-flops make the pain worse. Ibuprofen helps slightly. She reports an infrequent limp due to the pain.   Amytriptyline refill - Patient would like a refill on amitriptyline.  She takes 5200 mg at bedtime which works quite well.  She notes that she would like diffuse milligram pills instead of the 100 mg pills for more convenient dosing.  ROS as above: Infrequent swelling, no fever, chills.   Exam:  BP 113/79   Pulse 91   Temp 98 F (36.7 C) (Oral)   Wt 199 lb (90.3 kg)   BMI 32.60 kg/m  Wt Readings from Last 5 Encounters:  12/18/18 199 lb (90.3 kg)  09/25/18 203 lb (92.1 kg)  03/03/18 198 lb 11.2 oz (90.1 kg)  11/30/17 191 lb 12.8 oz (87 kg)  11/02/17 194 lb 1.6 oz (88 kg)    Gen: Well NAD HEENT: EOMI,  MMM. External auditory canal and TM clear and intact.  Lungs: Normal work of breathing.  Heart: RRR no MRG Exts: Brisk capillary refill, warm and well perfused.  MSK: Left first toe: decreased ROM in L MTP.  Especially extension.  Mild tenderness in the MTP. No erythema, swelling, or warmth.   Lab and Radiology Results X-ray images left first toe personally independently reviewed X-ray left first toe reveals no significant degenerative changes.  No erosions lesions or fracture. Await formal radiology review  Assessment and Plan: 38  y.o. female with left first hallux pain. Likely first hallux rigidis. Will check a uric acid to evaluate for gout. Will also XR toe to r/o fracture or bone lesion. Start diclofenac gel and obtain OTC fiber spring plate for management. Will consider corticosteroid injection if pain persists.   Insomnia/pain refilled amitriptyline.   PDMP not reviewed this encounter. Orders Placed This Encounter  Procedures  . DG Toe Great Left    Order Specific Question:   Reason for exam:    Answer:   eval left 1st MTP pain    Order Specific Question:   Is the patient pregnant?    Answer:   No    Order Specific Question:   Preferred imaging location?    Answer:   [facility]  . Uric acid   Meds ordered this encounter  Medications  . amitriptyline (ELAVIL) 50 MG tablet    Sig: Take 1-2 tablets (50-100 mg total) by mouth at bedtime as needed for sleep (pain).    Dispense:  180 tablet    Refill:  2  . diclofenac sodium (VOLTAREN) 1 % GEL    Sig: Apply 4 g topically 4 (four) times daily. To affected joint.    Dispense:  100 g    Refill:  11     Historical information moved to improve visibility of documentation.  Past Medical History:  Diagnosis Date  .  Anxiety   . Asthma   . History of TMJ disorder    Past Surgical History:  Procedure Laterality Date  . BACK SURGERY  December 2015  . KNEE ARTHROSCOPY  1998   left  . LAPAROSCOPIC TUBAL LIGATION  02/28/2012   Procedure: LAPAROSCOPIC TUBAL LIGATION;  Surgeon: Cyril Mourning, MD;  Location: [facility];  Service: Gynecology;  Laterality: Bilateral;  . TONSILLECTOMY     Social History   Tobacco Use  . Smoking status: Never Smoker  . Smokeless tobacco: Never Used  Substance Use Topics  . Alcohol use: Yes   family history includes Depression in her mother; Hypertension in her father and mother.  Medications: Current Outpatient Medications  Medication Sig Dispense Refill  . Acetaminophen (TYLENOL ARTHRITIS PAIN PO) Take by  mouth.    . Albuterol Sulfate (PROAIR RESPICLICK) 932 (90 Base) MCG/ACT AEPB Inhale 1-2 puffs into the lungs every 4 (four) hours as needed (cough/wheeze). 1 each 1  . amitriptyline (ELAVIL) 100 MG tablet Take 1 tablet (100 mg total) by mouth at bedtime. Due for follow up visit w/PCP 30 tablet 0  . amoxicillin-clavulanate (AUGMENTIN) 875-125 MG tablet Take 1 tablet by mouth 2 (two) times daily. 14 tablet 0  . baclofen (LIORESAL) 10 MG tablet Take 1-2 tablets (10-20 mg total) by mouth 3 (three) times daily as needed for muscle spasms. 90 each 1  . buPROPion (WELLBUTRIN XL) 300 MG 24 hr tablet TAKE 1 TABLET (300 MG TOTAL) BY MOUTH DAILY. 90 tablet 3  . cetirizine (ZYRTEC) 10 MG tablet Take 1 tablet (10 mg total) by mouth daily. 30 tablet 3  . clonazePAM (KLONOPIN) 0.5 MG tablet Half to full tab daily as needed for anxiety. 90 tablet 0  . fluticasone (FLONASE) 50 MCG/ACT nasal spray Place 2 sprays into both nostrils daily. 16 g 6  . IBUPROFEN PO Take 800 mg by mouth as needed.     . pantoprazole (PROTONIX) 40 MG tablet Take 1 tablet (40 mg total) by mouth 2 (two) times daily. 180 tablet 3  . predniSONE (DELTASONE) 50 MG tablet Take 1 tablet (50 mg total) by mouth daily. (Patient not taking: Reported on 09/27/2018) 5 tablet 0  . SUMAtriptan (IMITREX) 50 MG tablet Take 1-2 tablets (50-100 mg total) by mouth once as needed for migraine. May repeat in 2 hours if headache persists or recurs. Limit use to 200 mg (4 tablets) per 24 hours, use no more than 1-2 times per week 30 tablet 0  . traMADol (ULTRAM) 50 MG tablet Take 1 tablet (50 mg total) by mouth every 8 (eight) hours as needed for severe pain. 15 tablet 0   No current facility-administered medications for this visit.    Allergies  Allergen Reactions  . Sulfa Antibiotics Hives  . Zoloft [Sertraline Hcl] Other (See Comments)    Hair loss  . Crab [Shellfish Allergy] Other (See Comments)    Allergist advises against eating crab.  . Other Rash     Dermabond, steri strips     Discussed warning signs or symptoms. Please see discharge instructions. Patient expresses understanding.   I personally was present and performed or re-performed the history, physical exam and medical decision-making activities of this service and have verified that the service and findings are accurately documented in the student's note. ___________________________________________ [name] M.D., ABFM., CAQSM. Primary Care and Sports Medicine Adjunct Instructor of [employer] [facility] [employer] Medicine

## 2018-12-19 ENCOUNTER — Encounter: Payer: Self-pay | Admitting: Family Medicine

## 2018-12-19 LAB — URIC ACID: Uric Acid, Serum: 6.2 mg/dL (ref 2.5–7.0)

## 2018-12-19 IMAGING — DX DG CHEST 2V
2 series · 2 of 2 positions shown · non-contrast
Comparison: Chest x-ray dated 02/16/2012.

CLINICAL DATA: Ten days productive cough, shortness of breath,
wheezing.

EXAM:
CHEST - 2 VIEW

[chest pa]
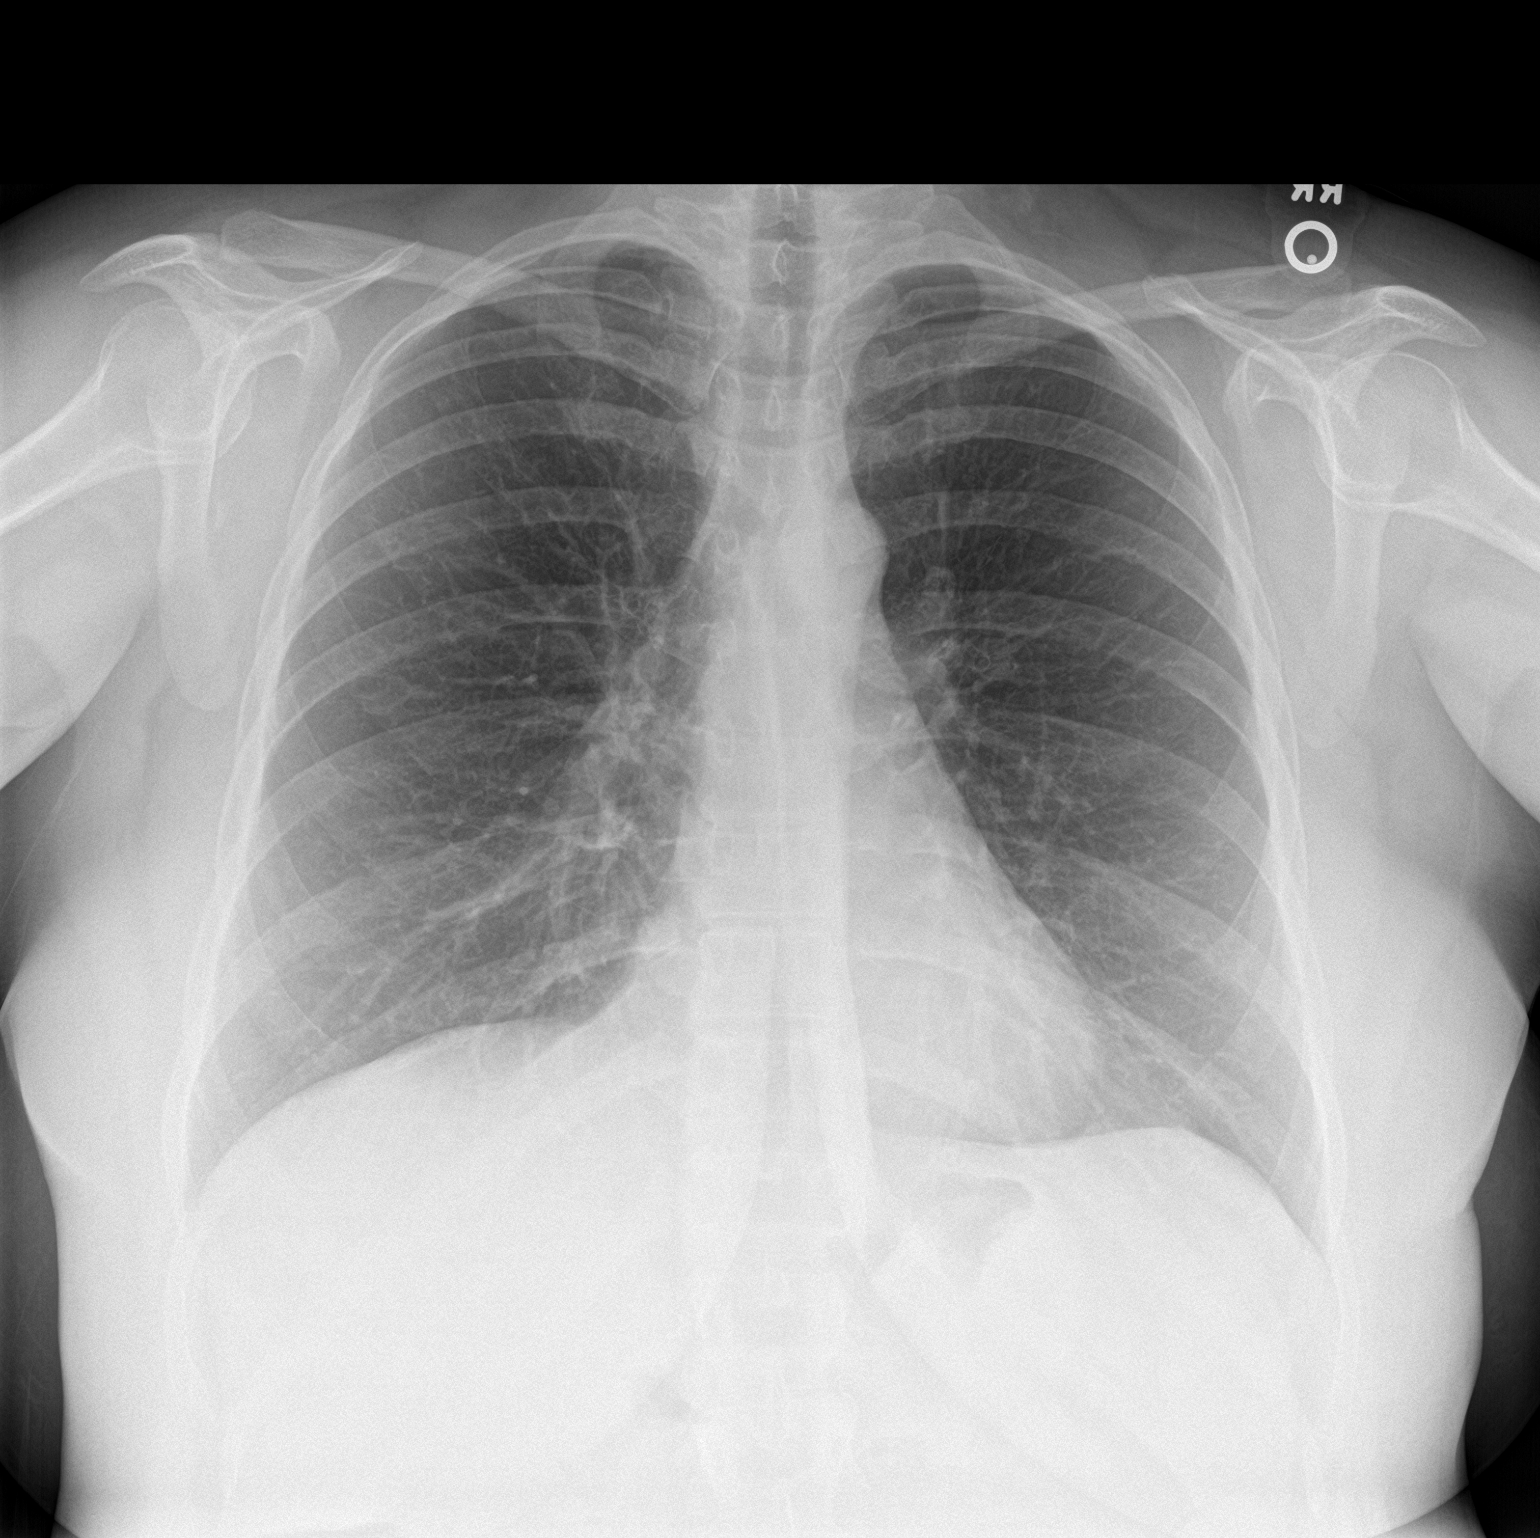

[chest lat]
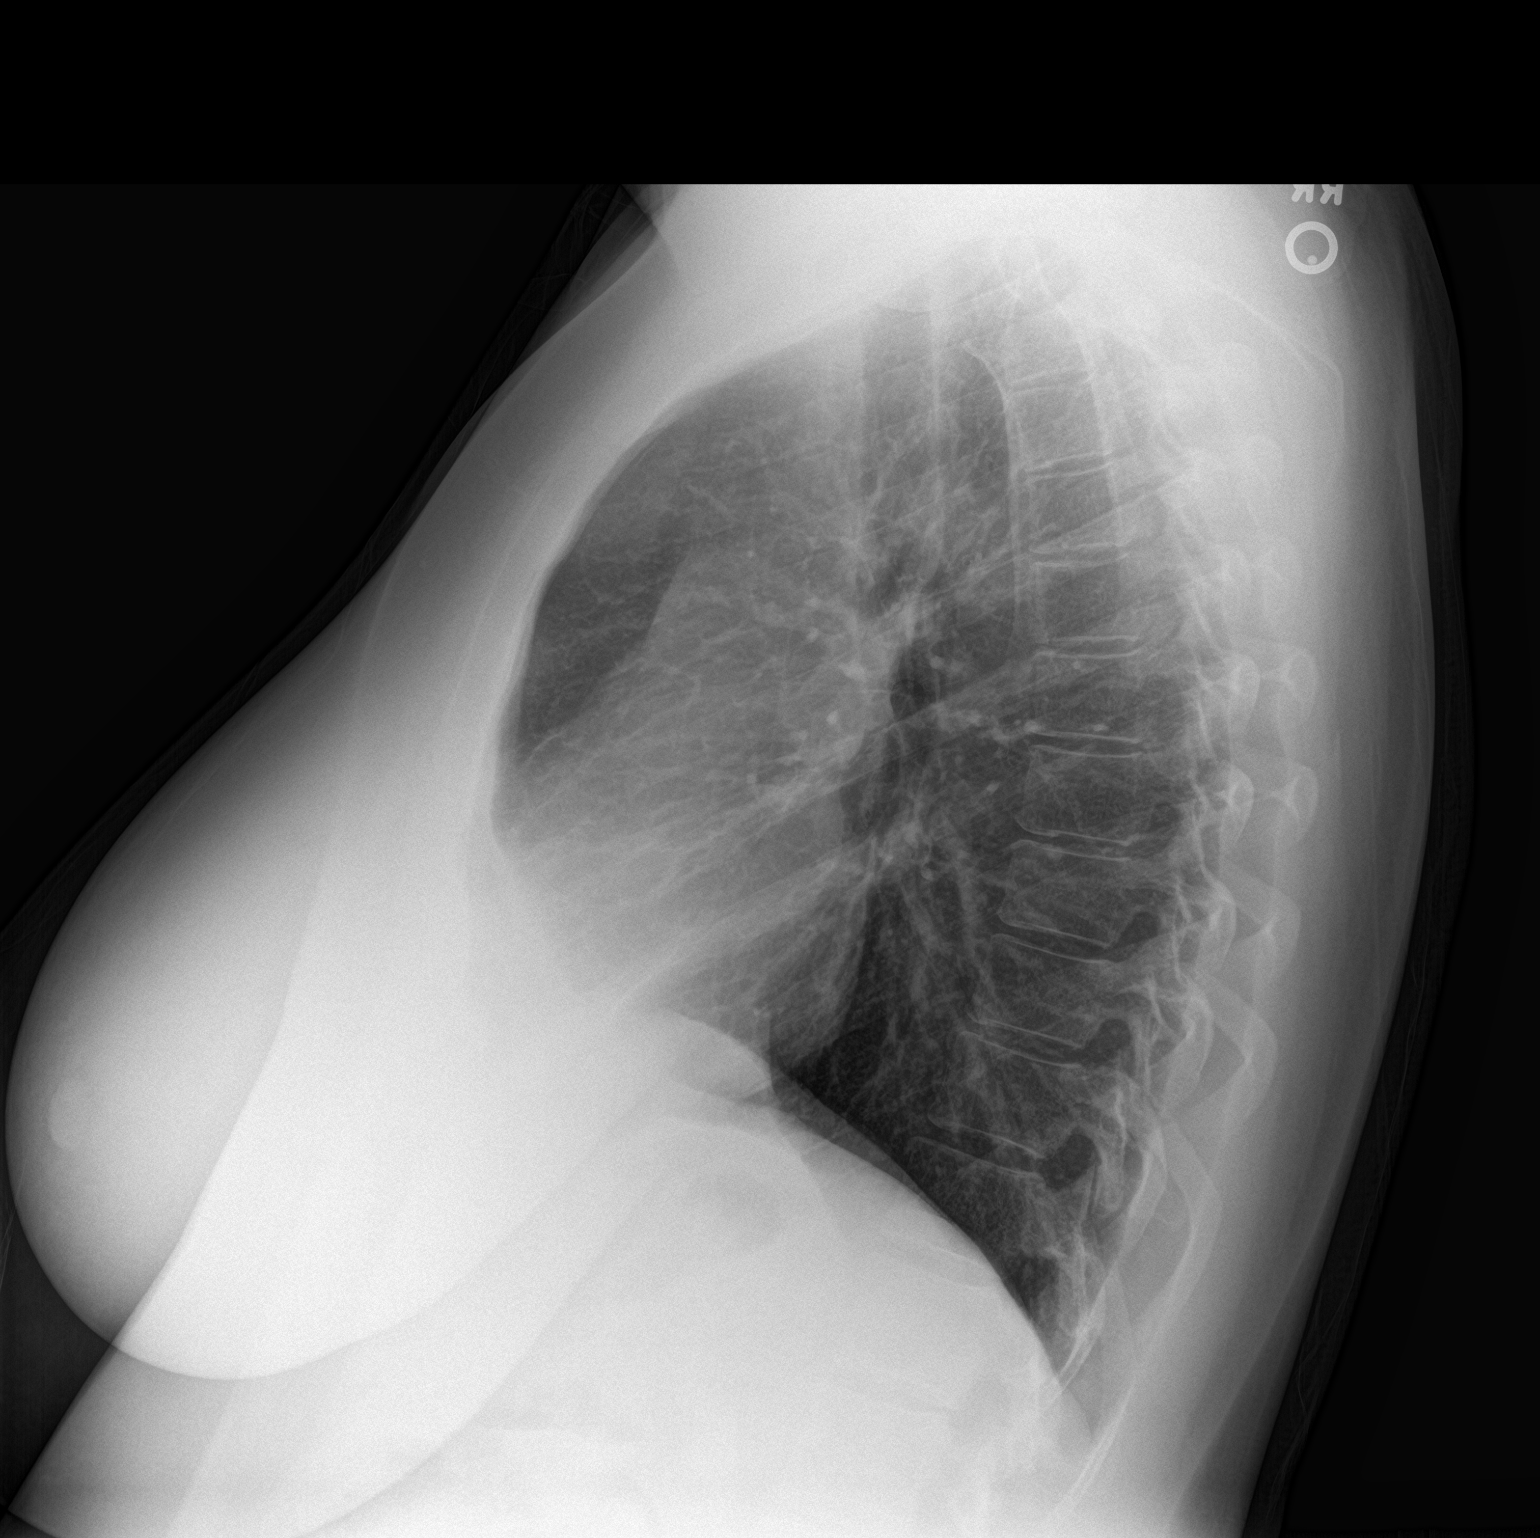

[2 of 2 positions shown; findings below may reference images not displayed]

FINDINGS: The heart size and mediastinal contours are within normal limits.
Both lungs are clear. The visualized skeletal structures are
unremarkable.
IMPRESSION: No active cardiopulmonary disease.  No pneumonia.

## 2018-12-19 MED ORDER — COLCHICINE 0.6 MG PO TABS: 0.6000 mg | ORAL_TABLET | Freq: Every day | ORAL | 2 refills | Status: DC

## 2018-12-19 MED FILL — COLCHICINE 0.6 MG TABS: 0.6 | 30 days supply | Qty: 30 | Fill #0

## 2018-12-19 NOTE — Addendum Note (Signed)
Addended by: Gregor Hams on: 12/19/2018 06:19 AM   Modules accepted: Orders

## 2019-01-03 ENCOUNTER — Encounter: Payer: Self-pay | Admitting: Family Medicine

## 2019-01-07 ENCOUNTER — Telehealth: Payer: No Typology Code available for payment source | Admitting: Family

## 2019-01-07 DIAGNOSIS — R399 Unspecified symptoms and signs involving the genitourinary system: Secondary | ICD-10-CM | POA: Diagnosis not present

## 2019-01-07 MED ORDER — CEPHALEXIN 500 MG PO CAPS: 500.0000 mg | ORAL_CAPSULE | Freq: Two times a day (BID) | ORAL | 0 refills | Status: DC

## 2019-01-07 NOTE — Progress Notes (Signed)
We are sorry that you are not feeling well.  Here is how we plan to help!  Based on what you shared with me it looks like you most likely have a simple urinary tract infection.  A UTI (Urinary Tract Infection) is a bacterial infection of the bladder.  Most cases of urinary tract infections are simple to treat but a key part of your care is to encourage you to drink plenty of fluids and watch your symptoms carefully.  I have prescribed Keflex 500 mg twice a day for 7 days.  Your symptoms should gradually improve. Call us if the burning in your urine worsens, you develop worsening fever, back pain or pelvic pain or if your symptoms do not resolve after completing the antibiotic.  Urinary tract infections can be prevented by drinking plenty of water to keep your body hydrated.  Also be sure when you wipe, wipe from front to back and don't hold it in!  If possible, empty your bladder every 4 hours.  Approximately 5 minutes was spent documenting and reviewing patient's chart.    Your e-visit answers were reviewed by a board certified advanced clinical practitioner to complete your personal care plan.  Depending on the condition, your plan could have included both over the counter or prescription medications.  If there is a problem please reply  once you have received a response from your provider.  Your safety is important to [place].  If you have drug allergies check your prescription carefully.    You can use MyChart to ask questions about today's visit, request a non-urgent call back, or ask for a work or school excuse for 24 hours related to this e-Visit. If it has been greater than 24 hours you will need to follow up with your provider, or enter a new e-Visit to address those concerns.   You will get an e-mail in the next two days asking about your experience.  I hope that your e-visit has been valuable and will speed your recovery. Thank you for using e-visits.

## 2019-01-17 MED FILL — BACLOFEN 10 MG TABS: 10 | 15 days supply | Qty: 90 | Fill #1

## 2019-03-12 MED FILL — BUPROPION HCL ER (XL) 300 M: 300 | 90 days supply | Qty: 90 | Fill #2

## 2019-04-23 ENCOUNTER — Telehealth: Payer: No Typology Code available for payment source | Admitting: Nurse Practitioner

## 2019-04-23 DIAGNOSIS — Z20822 Contact with and (suspected) exposure to covid-19: Secondary | ICD-10-CM

## 2019-04-23 DIAGNOSIS — R5081 Fever presenting with conditions classified elsewhere: Secondary | ICD-10-CM

## 2019-04-23 DIAGNOSIS — R0602 Shortness of breath: Secondary | ICD-10-CM

## 2019-04-23 DIAGNOSIS — R0989 Other specified symptoms and signs involving the circulatory and respiratory systems: Secondary | ICD-10-CM

## 2019-04-23 MED ORDER — BENZONATATE 100 MG PO CAPS: 100.0000 mg | ORAL_CAPSULE | Freq: Three times a day (TID) | ORAL | 0 refills | Status: DC | PRN

## 2019-04-23 MED FILL — BENZONATATE 100 MG CAPS: 100 | 7 days supply | Qty: 20 | Fill #0

## 2019-04-23 NOTE — Progress Notes (Signed)
E-Visit for Corona Virus Screening  Your current symptoms could be consistent with the coronavirus. This needs to be ruled out before further treatment can be rendered. Many health care providers can now test patients at their office but not all are.  [place] has multiple testing sites. For information on our [place] testing locations and hours go to [email or web address]  We are enrolling you in our [place] for [facility] . Daily you will receive a questionnaire within the [place] website. Our COVID 19 response team will be monitoring your responses daily.  Testing Information: The COVID-19 Community Testing sites will begin testing BY APPOINTMENT ONLY.  You can schedule online at [email or web address]  If you do not have access to a smart phone or computer you may call [phone] for an appointment.   Additional testing sites in the Community:  . For [facility] Testing sites in [facility]  FaceUpdate.[email or web address]  . For Pop-up testing sites in [place]  BowlDirectory.co.uk  . For Testing sites with regular hours [email or web address]  . For [place] [email or web address]  . For [facility] [email or web address]  . For [facility] testing in [place] and [employer] [email or web address]  . For [facility] testing in [facility]   [email or web address]  For  more information about community testing call [phone]   Please quarantine yourself while awaiting your test results. Please stay home for a minimum of 10 days from the first day of illness with improving symptoms and you have had  24 hours of no fever (without the use of Tylenol (Acetaminophen) Motrin (Ibuprofen) or any fever reducing medication).  Also - Do not get tested prior to returning to work because once you have had a positive test the test can stay positive for more then a month in some cases.   You should wear a mask or cloth face covering over your nose and mouth if you must be around other people or animals, including pets (even at home). Try to stay at least 6 feet away from other people. This will protect the people around you.  Please continue good preventive care measures, including:  frequent hand-washing, avoid touching your face, cover coughs/sneezes, stay out of crowds and keep a 6 foot distance from others.  COVID-19 is a respiratory illness with symptoms that are similar to the flu. Symptoms are typically mild to moderate, but there have been cases of severe illness and death due to the virus.   The following symptoms may appear 2-14 days after exposure: . Fever . Cough . Shortness of breath or difficulty breathing . Chills . Repeated shaking with chills . Muscle pain . Headache . Sore throat . New loss of taste or smell . Fatigue . Congestion or runny nose . Nausea or vomiting . Diarrhea  Go to the nearest hospital ED for assessment if fever/cough/breathlessness are severe or illness seems like a threat to life.  It is vitally important that if you feel that you have an infection such as this virus or any other virus that you stay home and away from places where you may spread it to others.  You should avoid contact with people age 25 and older.   You can use medication such as A prescription cough medication called Tessalon Perles 100 mg. You may take 1-2 capsules every 8 hours as needed for cough  You may also take acetaminophen (Tylenol) as needed for fever.  Reduce your risk of any infection by using the same precautions used for  avoiding the common cold or flu:  [place] Kitchen Wash your hands often  with soap and warm water for at least 20 seconds.  If soap and water are not readily available, use an alcohol-based hand sanitizer with at least 60% alcohol.  . If coughing or sneezing, cover your mouth and nose by coughing or sneezing into the elbow areas of your shirt or coat, into a tissue or into your sleeve (not your hands). . Avoid shaking hands with others and consider head nods or verbal greetings only. . Avoid touching your eyes, nose, or mouth with unwashed hands.  . Avoid close contact with people who are sick. . Avoid places or events with large numbers of people in one location, like concerts or sporting events. . Carefully consider travel plans you have or are making. . If you are planning any travel outside or inside the [place], visit the CDC's Travelers' Health webpage for the latest health notices. . If you have some symptoms but not all symptoms, continue to monitor at home and seek medical attention if your symptoms worsen. . If you are having a medical emergency, call 911.  HOME CARE . Only take medications as instructed by your medical team. . Drink plenty of fluids and get plenty of rest. . A steam or ultrasonic humidifier can help if you have congestion.   GET HELP RIGHT AWAY IF YOU HAVE EMERGENCY WARNING SIGNS** FOR COVID-19. If you or someone is showing any of these signs seek emergency medical care immediately. Call 911 or proceed to your closest emergency facility if: . You develop worsening high fever. . Trouble breathing . Bluish lips or face . Persistent pain or pressure in the chest . New confusion . Inability to wake or stay awake . You cough up blood. . Your symptoms become more severe  **This list is not all possible symptoms. Contact your medical provider for any symptoms that are sever or concerning to you.  MAKE SURE YOU   Understand these instructions.  Will watch your condition.  Will get help right away if you are not doing well or get  worse.  Your e-visit answers were reviewed by a board certified advanced clinical practitioner to complete your personal care plan.  Depending on the condition, your plan could have included both over the counter or prescription medications.  If there is a problem please reply once you have received a response from your provider.  Your safety is important to [place].  If you have drug allergies check your prescription carefully.    You can use MyChart to ask questions about today's visit, request a non-urgent call back, or ask for a work or school excuse for 24 hours related to this e-Visit. If it has been greater than 24 hours you will need to follow up with your provider, or enter a new e-Visit to address those concerns. You will get an e-mail in the next two days asking about your experience.  I hope that your e-visit has been valuable and will speed your recovery. Thank you for using e-visits.   5-10 minutes spent reviewing and documenting in chart.

## 2019-04-24 ENCOUNTER — Telehealth: Payer: No Typology Code available for payment source | Admitting: Physician Assistant

## 2019-04-24 ENCOUNTER — Encounter: Payer: Self-pay | Admitting: Osteopathic Medicine

## 2019-04-24 DIAGNOSIS — J329 Chronic sinusitis, unspecified: Secondary | ICD-10-CM | POA: Diagnosis not present

## 2019-04-24 DIAGNOSIS — R0981 Nasal congestion: Secondary | ICD-10-CM | POA: Diagnosis not present

## 2019-04-24 MED ORDER — FLUTICASONE PROPIONATE 50 MCG/ACT NA SUSP: 2.0000 | Freq: Every day | NASAL | 6 refills | Status: DC

## 2019-04-24 MED ORDER — AMOXICILLIN-POT CLAVULANATE 875-125 MG PO TABS: 1.0000 | ORAL_TABLET | Freq: Two times a day (BID) | ORAL | 0 refills | Status: AC

## 2019-04-24 MED FILL — AMOX-CLAV 875-125 MG TABLET: 875-125 | 7 days supply | Qty: 14 | Fill #0

## 2019-04-24 MED FILL — FLUTICASONE PROP 50 MCG SPR: 50 | 30 days supply | Qty: 16 | Fill #0

## 2019-04-24 NOTE — Telephone Encounter (Signed)
E-visit done yesterday. I feel that they should write RX for patient since they evaluated, but I am not sure how that works?  Please advise

## 2019-04-24 NOTE — Progress Notes (Signed)
We are sorry that you are not feeling well.  Here is how we plan to help!  Based on what you have shared with me it looks like you have sinusitis.  Sinusitis is inflammation and infection in the sinus cavities of the head.  Based on your presentation I believe you most likely have Acute Bacterial Sinusitis.  This is an infection caused by bacteria and is treated with antibiotics. I have prescribed Augmentin 822m/125mg one tablet twice daily with food, for 7 days.  And Flonase nasal spray twice daily.   You may use an oral decongestant such as Mucinex D or if you have glaucoma or high blood pressure use plain Mucinex. Saline nasal spray help and can safely be used as often as needed for congestion.  If you develop worsening sinus pain, fever or notice severe headache and vision changes, or if symptoms are not better after completion of antibiotic, please schedule an appointment with a health care provider.    Sinus infections are not as easily transmitted as other respiratory infection, however we still recommend that you avoid close contact with loved ones, especially the very young and elderly.  Remember to wash your hands thoroughly throughout the day as this is the number one way to prevent the spread of infection!  Home Care:  Only take medications as instructed by your medical team.  Complete the entire course of an antibiotic.  Do not take these medications with alcohol.  A steam or ultrasonic humidifier can help congestion.  You can place a towel over your head and breathe in the steam from hot water coming from a faucet.  Avoid close contacts especially the very young and the elderly.  Cover your mouth when you cough or sneeze.  Always remember to wash your hands.  Get Help Right Away If:  You develop worsening fever or sinus pain.  You develop a severe head ache or visual changes.  Your symptoms persist after you have completed your treatment plan.  Make sure  you  Understand these instructions.  Will watch your condition.  Will get help right away if you are not doing well or get worse.  Your e-visit answers were reviewed by a board certified advanced clinical practitioner to complete your personal care plan.  Depending on the condition, your plan could have included both over the counter or prescription medications.  If there is a problem please reply  once you have received a response from your provider.  Your safety is important to [place]  If you have drug allergies check your prescription carefully.    You can use MyChart to ask questions about today's visit, request a non-urgent call back, or ask for a work or school excuse for 24 hours related to this e-Visit. If it has been greater than 24 hours you will need to follow up with your provider, or enter a new e-Visit to address those concerns.  You will get an e-mail in the next two days asking about your experience.  I hope that your e-visit has been valuable and will speed your recovery. Thank you for using e-visits.   Greater than 5 minutes, yet less than 10 minutes of time have been spent researching, coordinating and implementing care for this patient today.

## 2019-05-09 ENCOUNTER — Other Ambulatory Visit: Payer: Self-pay | Admitting: Family Medicine

## 2019-05-09 DIAGNOSIS — K219 Gastro-esophageal reflux disease without esophagitis: Secondary | ICD-10-CM

## 2019-05-09 MED FILL — PANTOPRAZOLE SOD DR 40 MG T: 40 | 90 days supply | Qty: 180 | Fill #0

## 2019-05-16 ENCOUNTER — Telehealth: Payer: No Typology Code available for payment source | Admitting: Family

## 2019-05-16 DIAGNOSIS — B372 Candidiasis of skin and nail: Secondary | ICD-10-CM | POA: Diagnosis not present

## 2019-05-16 MED ORDER — NYSTATIN 100000 UNIT/GM EX POWD: 1.0000 "application " | Freq: Three times a day (TID) | CUTANEOUS | 0 refills | Status: DC

## 2019-05-16 MED ORDER — NYSTATIN-TRIAMCINOLONE 100000-0.1 UNIT/GM-% EX OINT: 1.0000 "application " | TOPICAL_OINTMENT | Freq: Two times a day (BID) | CUTANEOUS | 0 refills | Status: DC

## 2019-05-16 NOTE — Progress Notes (Signed)
E Visit for Rash  We are sorry that you are not feeling well. Here is how we plan to help!        Based upon your presentation it appears you have a fungal infection.  I have prescribed: and Nystatin cream apply to the affected area twice daily and nystatin powder that you can apply four times a day as needed.   HOME CARE:   Take cool showers and avoid direct sunlight.  Apply cool compress or wet dressings.  Take a bath in an oatmeal bath.  Sprinkle content of one Aveeno packet under running faucet with comfortably warm water.  Bathe for 15-20 minutes, 1-2 times daily.  Pat dry with a towel. Do not rub the rash.  Use hydrocortisone cream.  Take an antihistamine like Benadryl for widespread rashes that itch.  The adult dose of Benadryl is 25-50 mg by mouth 4 times daily.  Caution:  This type of medication may cause sleepiness.  Do not drink alcohol, drive, or operate dangerous machinery while taking antihistamines.  Do not take these medications if you have prostate enlargement.  Read package instructions thoroughly on all medications that you take.  GET HELP RIGHT AWAY IF:   Symptoms don't go away after treatment.  Severe itching that persists.  If you rash spreads or swells.  If you rash begins to smell.  If it blisters and opens or develops a yellow-brown crust.  You develop a fever.  You have a sore throat.  You become short of breath.  MAKE SURE YOU:  Understand these instructions. Will watch your condition. Will get help right away if you are not doing well or get worse.  Thank you for choosing an e-visit. Your e-visit answers were reviewed by a board certified advanced clinical practitioner to complete your personal care plan. Depending upon the condition, your plan could have included both over the counter or prescription medications. Please review your pharmacy choice. Be sure that the pharmacy you have chosen is open so that you can pick up your  prescription now.  If there is a problem you may message your provider in [place] to have the prescription routed to another pharmacy. Your safety is important to [place]. If you have drug allergies check your prescription carefully.  For the next 24 hours, you can use MyChart to ask questions about today's visit, request a non-urgent call back, or ask for a work or school excuse from your e-visit provider. You will get an email in the next two days asking about your experience. I hope that your e-visit has been valuable and will speed your recovery.    Approximately 5 minutes was spent documenting and reviewing patient's chart.

## 2019-05-21 MED FILL — AMITRIPTYLINE HCL 50 MG TAB: 50 | 90 days supply | Qty: 180 | Fill #1

## 2019-05-29 MED FILL — AMITRIPTYLINE HCL 50 MG TAB: 50 | 90 days supply | Qty: 180 | Fill #1

## 2019-06-11 MED FILL — BUPROPION HCL XL 300 MG TAB: 300 | 90 days supply | Qty: 90 | Fill #3

## 2019-06-19 MED FILL — BUPROPION HCL XL 300 MG TAB: 300 | 90 days supply | Qty: 90 | Fill #3

## 2019-07-04 ENCOUNTER — Ambulatory Visit (INDEPENDENT_AMBULATORY_CARE_PROVIDER_SITE_OTHER): Payer: No Typology Code available for payment source | Admitting: Physician Assistant

## 2019-07-04 ENCOUNTER — Ambulatory Visit (INDEPENDENT_AMBULATORY_CARE_PROVIDER_SITE_OTHER): Payer: No Typology Code available for payment source

## 2019-07-04 ENCOUNTER — Encounter: Payer: Self-pay | Admitting: Physician Assistant

## 2019-07-04 ENCOUNTER — Other Ambulatory Visit: Payer: Self-pay

## 2019-07-04 VITALS — BP 133/87 | HR 104 | Temp 98.3°F | Wt 201.0 lb

## 2019-07-04 DIAGNOSIS — R109 Unspecified abdominal pain: Secondary | ICD-10-CM | POA: Diagnosis not present

## 2019-07-04 DIAGNOSIS — K5900 Constipation, unspecified: Secondary | ICD-10-CM

## 2019-07-04 MED ORDER — LINACLOTIDE 145 MCG PO CAPS: 145.0000 ug | ORAL_CAPSULE | Freq: Every day | ORAL | 1 refills | Status: DC

## 2019-07-04 MED FILL — LINZESS 145 MCG CAPSULE: 145 | 90 days supply | Qty: 90 | Fill #0

## 2019-07-04 NOTE — Progress Notes (Signed)
Call patient:  Xray appears like constipation seen throughout transverse, descending, and rectosigmoid colon.   I would like for you to take 2 dulcolax tablets and then 2 hours after mix 255gram bottle of miralax with 64 oz of gatorade and mix. Drink 8 oz every 20 to 30 minutes until solution gone. I would suggest being out of work tomorrow. Ok to write note.

## 2019-07-04 NOTE — Progress Notes (Signed)
   Subjective:    Patient ID: Tracey Patterson, female    DOB: Jul 17, 1980, 39 y.o.   MRN: [id number]  HPI  Pt is a 39 yo female with hx of colitis and constipation who presents to the clinic with constipation and abdominal pain. She had what she thought was stomach virus for 24 hours on 28th of march. She had diarrhea, abdomina pain, nausea. She felt almost like collitis but then resolved. She has not had a bowel movement since then. She now has right upper and right lower pain.she is passing gas but no stool. No melena or hematochezia history. No fever or chills. She has never been to GI. She has tried correctal and colace with no benefit.  .. Active Ambulatory Problems    Diagnosis Date Noted  . Insomnia 08/13/2012  . Stress and adjustment reaction 08/13/2012  . Other malaise and fatigue 10/26/2012  . Flushing 10/26/2012  . Myalgia and myositis 10/30/2012  . Asthma, chronic 12/14/2012  . GERD (gastroesophageal reflux disease) 12/14/2012  . Seasonal allergies 12/14/2012  . Anxiety and depression 12/14/2012  . Transaminitis 12/14/2012  . Vitamin D deficiency 02/15/2014  . Ganglion cyst 02/17/2016  . Generalized anxiety disorder 02/17/2016  . Recurrent sinusitis 02/17/2016  . Degenerative tear of acetabular labrum of left hip 07/15/2016  . Left shoulder pain 09/07/2016  . Colitis 06/17/2017  . Left ovarian cyst 06/17/2017  . Fibromyalgia 11/03/2017  . ANA positive 12/30/2017   Resolved Ambulatory Problems    Diagnosis Date Noted  . Radiculitis of left cervical region 11/30/2013  . Left lumbar radiculitis 11/30/2013   Past Medical History:  Diagnosis Date  . Anxiety   . Asthma   . History of TMJ disorder      Review of Systems See HPI.     Objective:   Physical Exam Vitals reviewed.  Constitutional:      Appearance: Normal appearance. She is obese.  HENT:     Head: Normocephalic.  Cardiovascular:     Rate and Rhythm: Normal rate and regular rhythm.     Pulses:  Normal pulses.  Pulmonary:     Effort: Pulmonary effort is normal.     Breath sounds: Normal breath sounds.  Abdominal:     General: There is no distension.     Palpations: Abdomen is soft. There is no mass.     Tenderness: There is abdominal tenderness. There is no right CVA tenderness, left CVA tenderness, guarding or rebound.     Comments: RUQ and RLQ tenderness diffusely.  Increased bowel sounds over RUQ.   Neurological:     General: No focal deficit present.     Mental Status: She is alert.  Psychiatric:        Mood and Affect: Mood normal.        Behavior: Behavior normal.           Assessment & Plan:  Marland KitchenMarland KitchenDiagnoses and all orders for this visit:  Constipation, unspecified constipation type -     DG Abd 2 Views  Abdominal pain, unspecified abdominal location   Reassuring soft abdomen with bowel sounds. Xray confirms constipation. Gave instructions for bowel clean out with miralax gatorade and ducolax. Written out of work for tomorrow. Follow up on Friday. Drink water. Increase fiber. HO given on constipation. Hx of problems with constipation. Consider linzess in the future for prevention.

## 2019-07-16 ENCOUNTER — Encounter: Payer: Self-pay | Admitting: Physician Assistant

## 2019-07-30 ENCOUNTER — Encounter: Payer: Self-pay | Admitting: Physician Assistant

## 2019-08-20 ENCOUNTER — Encounter: Payer: Self-pay | Admitting: Physician Assistant

## 2019-08-20 DIAGNOSIS — K5909 Other constipation: Secondary | ICD-10-CM

## 2019-08-20 NOTE — Telephone Encounter (Signed)
I saw her once for this but since she is asking for referrals or you may have a magic constipation regimen I will defer to you.

## 2019-08-22 NOTE — Telephone Encounter (Signed)
Referral paced

## 2019-08-23 ENCOUNTER — Encounter: Payer: Self-pay | Admitting: Gastroenterology

## 2019-08-30 ENCOUNTER — Other Ambulatory Visit: Payer: Self-pay | Admitting: Osteopathic Medicine

## 2019-08-30 DIAGNOSIS — F411 Generalized anxiety disorder: Secondary | ICD-10-CM

## 2019-08-30 MED FILL — AMITRIPTYLINE HCL 50 MG TAB: 50 | 90 days supply | Qty: 180 | Fill #2

## 2019-08-30 MED FILL — buPROPion HCL ER (XL) 300 M: 300 | 90 days supply | Qty: 90 | Fill #0

## 2019-09-18 ENCOUNTER — Encounter: Payer: Self-pay | Admitting: Osteopathic Medicine

## 2019-09-18 ENCOUNTER — Other Ambulatory Visit: Payer: Self-pay | Admitting: Osteopathic Medicine

## 2019-09-18 DIAGNOSIS — F32A Depression, unspecified: Secondary | ICD-10-CM

## 2019-09-18 DIAGNOSIS — F419 Anxiety disorder, unspecified: Secondary | ICD-10-CM

## 2019-09-18 MED ORDER — CLONAZEPAM 0.5 MG PO TABS: 0.2500 mg | ORAL_TABLET | Freq: Two times a day (BID) | ORAL | 0 refills | Status: DC | PRN

## 2019-09-18 MED FILL — clonazePAM 0.5 MG TABS: 0.5 | 15 days supply | Qty: 30 | Fill #0

## 2019-09-26 ENCOUNTER — Encounter: Payer: Self-pay | Admitting: Family Medicine

## 2019-09-26 ENCOUNTER — Ambulatory Visit (INDEPENDENT_AMBULATORY_CARE_PROVIDER_SITE_OTHER): Payer: No Typology Code available for payment source | Admitting: Family Medicine

## 2019-09-26 ENCOUNTER — Other Ambulatory Visit: Payer: Self-pay

## 2019-09-26 VITALS — BP 120/78 | HR 86 | Ht 65.5 in | Wt 197.2 lb

## 2019-09-26 DIAGNOSIS — M25552 Pain in left hip: Secondary | ICD-10-CM | POA: Diagnosis not present

## 2019-09-26 DIAGNOSIS — M5416 Radiculopathy, lumbar region: Secondary | ICD-10-CM

## 2019-09-26 DIAGNOSIS — S39012A Strain of muscle, fascia and tendon of lower back, initial encounter: Secondary | ICD-10-CM

## 2019-09-26 DIAGNOSIS — M25551 Pain in right hip: Secondary | ICD-10-CM | POA: Diagnosis not present

## 2019-09-26 MED ORDER — GABAPENTIN 300 MG PO CAPS
300.0000 mg | ORAL_CAPSULE | Freq: Three times a day (TID) | ORAL | 1 refills | Status: DC | PRN
  Filled 2020-09-10: qty 90, 30d supply, fill #0

## 2019-09-26 MED ORDER — PREDNISONE 50 MG PO TABS: 50.0000 mg | ORAL_TABLET | Freq: Every day | ORAL | 0 refills | Status: DC

## 2019-09-26 MED FILL — GABAPENTIN 300 MG CAPSULE: 300 | 30 days supply | Qty: 90 | Fill #0

## 2019-09-26 MED FILL — predniSONE 50 MG TABS: 50 | 5 days supply | Qty: 5 | Fill #0

## 2019-09-26 NOTE — Progress Notes (Signed)
I, Wendy Poet, LAT, ATC, am serving as scribe for Dr. [name].  Tracey Patterson is a 39 y.o. female who presents to [place] at [facility] today for low back pain and R hip pain.  She was last seen by Dr. Georgina Snell on 12/18/18 for her L great toe.  She has a chronic hx of LBP and has had a prior RFA at R L4-5 on 03/31/18.  Today, she reports that her R hip started hurting about 6 months ago w/ no known MOI.  About 2 weeks ago, she slipped and fell going out her front door and her legs went into a split position w/ her R leg in front.  She reports B LE pain since then w/ the R LE pain being new compared to her prior radicular symptoms in her L leg due to a HNP at L4-5.  Radiating pain: yes into the B LE LE numbness/tingling: yes in her B heels and L lateral lower leg at L5 dermatomal pattern. LE weakness: No Aggravating factors: Prolonged standing/walking; forward flexion; pushing/pulling pts as an XR tech Treatments tried: prior course of PT in 2020 for LBP (12 visits); HEP shown to her previously in PT; rest; IBU 839m tid;   Diagnostic imaging: L-spine MRI- 07/12/16; L-spine XR- 07/08/16   Pertinent review of systems: No fevers or chills  Relevant historical information: History left L4-5 hemilaminectomy and ablation in the past that helped a little.   Exam:  BP 120/78 (BP Location: Right Arm, Patient Position: Sitting, Cuff Size: Normal)   Pulse 86   Ht 5' 5.5" (1.664 m)   Wt 197 lb 3.2 oz (89.4 kg)   SpO2 98%   BMI 32.32 kg/m  General: Well Developed, well nourished, and in no acute distress.   MSK: L-spine normal-appearing nontender midline.   Tender palpation bilateral lumbar paraspinal musculature..  Paraspinal musculature rigid. Decreased lumbar motion. Lower extremity strength reflexes and sensation are intact distally.  Positive bilateral straight leg raise test.  Right hip normal-appearing normal motion. Tender to palpation greater trochanter and  buttocks. Hip abduction strength is intact.  External rotation strength diminished 4/5 with pain.  Left hip normal-appearing normal motion. Tender palpation greater trochanter and buttocks. Hip abduction strength is intact.  External rotation strength diminished 4/5 with pain.   Lab and Radiology Results  EXAM: MRI LUMBAR SPINE WITHOUT CONTRAST  TECHNIQUE: Multiplanar, multisequence MR imaging of the lumbar spine was performed. No intravenous contrast was administered.  COMPARISON:  07/08/2016 plain film exam.  04/24/2014 CT.  FINDINGS: Segmentation: Last fully open disk space is labeled L5-S1. Present examination incorporates from T11-12 disc space through the S2-3 level.  Alignment:  Mild straightening of the lumbar spine.  Vertebrae: Scattered focal fatty deposits/small hemangiomas without change.  Conus medullaris: Extends to the L1 level level and appears normal.  Paraspinal and other soft tissues: Negative  Disc levels:  T11-12 through L3-4 unremarkable.  L4-5: Prior left hemilaminectomy. Disc degeneration. Bulge with slight narrowing ventral thecal sac. Facet degenerative changes. No significant spinal stenosis or foraminal narrowing. New from the prior examination is a small Schmorl's node deformity left aspect L4 inferior endplate with surrounding mild reactive edema.  L5-S1:  Minimal facet degenerative changes.  IMPRESSION: L4-5 prior left hemilaminectomy. Disc degeneration, bulge and facet degenerative changes without significant spinal stenosis or foraminal narrowing.  New from the prior examination is a small Schmorl's node deformity left aspect L4 inferior endplate with surrounding mild reactive edema.   Electronically  Signed   By: Genia Del M.D.   On: 07/12/2016 19:01  I, [name], personally (independently) visualized and performed the interpretation of the images attached in this note.     Assessment and Plan: 39  y.o. female with  Exacerbation of chronic low back pain after fall.  Patient will have some benefit with physical therapy but I am doubtful that she will get complete resolution of this pain.  May need further interventions in the future.  Lumbar radiculopathy at L5 bilaterally.  New issue today.  No evidence of change at this level on most recent MRI from 2018.  Trial of prednisone gabapentin physical therapy.  If not improving will proceed with MRI.  Bilateral lateral hip and buttocks pain: After fall 2 weeks ago.  Strain of hip abductor and rotators.  This should benefit significantly physical therapy.  Check back as needed.   PDMP not reviewed this encounter. Orders Placed This Encounter  Procedures  . Ambulatory referral to Physical Therapy    Referral Priority:   Routine    Referral Type:   Physical Medicine    Referral Reason:   Specialty Services Required    Requested Specialty:   Physical Therapy   Meds ordered this encounter  Medications  . predniSONE (DELTASONE) 50 MG tablet    Sig: Take 1 tablet (50 mg total) by mouth daily.    Dispense:  5 tablet    Refill:  0  . gabapentin (NEURONTIN) 300 MG capsule    Sig: Take 1 capsule (300 mg total) by mouth 3 (three) times daily as needed (nerve pain).    Dispense:  90 capsule    Refill:  3     Discussed warning signs or symptoms. Please see discharge instructions. Patient expresses understanding.   The above documentation has been reviewed and is accurate and complete [name], M.D.

## 2019-09-26 NOTE — Patient Instructions (Addendum)
Thank you for coming in today. Plan for PT.  Take prednisone and gabapentin.  Gabapentin may make you sleepy.  It can be taken 3x daily.   Send me an updated  In a few weeks.

## 2019-10-03 ENCOUNTER — Ambulatory Visit: Payer: No Typology Code available for payment source | Admitting: Rehabilitative and Restorative Service Providers"

## 2019-10-03 ENCOUNTER — Encounter: Payer: Self-pay | Admitting: Rehabilitative and Restorative Service Providers"

## 2019-10-03 ENCOUNTER — Other Ambulatory Visit: Payer: Self-pay

## 2019-10-03 DIAGNOSIS — M5442 Lumbago with sciatica, left side: Secondary | ICD-10-CM

## 2019-10-03 DIAGNOSIS — M62838 Other muscle spasm: Secondary | ICD-10-CM | POA: Diagnosis not present

## 2019-10-03 DIAGNOSIS — M5441 Lumbago with sciatica, right side: Secondary | ICD-10-CM

## 2019-10-03 DIAGNOSIS — R293 Abnormal posture: Secondary | ICD-10-CM

## 2019-10-03 DIAGNOSIS — M6281 Muscle weakness (generalized): Secondary | ICD-10-CM

## 2019-10-03 NOTE — Therapy (Signed)
[place], [place], [address] Phone: [phone]   Fax:  [phone]  Physical Therapy Evaluation  Patient Details  Name: Tracey Patterson MRN: [id number] Date of Birth: Jul 10, 1980 Referring Provider (PT): Dr [name]    Encounter Date: 10/03/2019   PT End of Session - 10/03/19 1246    Visit Number 1    Number of Visits 12    Date for PT Re-Evaluation 11/14/19    PT Start Time 3291    PT Stop Time 1246    PT Time Calculation (min) 61 min    Activity Tolerance Patient tolerated treatment well           Past Medical History:  Diagnosis Date  . Anxiety   . Asthma   . History of TMJ disorder     Past Surgical History:  Procedure Laterality Date  . BACK SURGERY  December 2015  . KNEE ARTHROSCOPY  1998   left  . LAPAROSCOPIC TUBAL LIGATION  02/28/2012   Procedure: LAPAROSCOPIC TUBAL LIGATION;  Surgeon: Cyril Mourning, MD;  Location: [facility];  Service: Gynecology;  Laterality: Bilateral;  . TONSILLECTOMY      There were no vitals filed for this visit.    Subjective Assessment - 10/03/19 1200    Subjective Patient reports that she feel ~ 3 weeks ago with Rt leg going out in front and Lt leg behind her - fell landing on her butt. Patient had some soreness for a day or so but pain increased significantly at ~ 2-3 days. She was out of work for about 1 week with some improvement. She returned to work and symptoms have increased significantly and continue to increase. Pain in LB and both posterior hips/buttocks into posterior thighs to knee; numbness in both heels.    Pertinent History neck pain 2015; microdisceomy L4/5 2015; episodes of increased flare up of LBP 2018/19/20; ablasion Rt L5 facet/nerve root 2019 with minimal improvement; arthritis facet joints L4/5 and Lt hip    Diagnostic tests MRI    Patient Stated Goals to be able to clean house; decrease pain; ride in the car > 30 min without  increased pain; work a shift with only moderate pain    Currently in Pain? Yes    Pain Score 4     Pain Location Back    Pain Orientation Right;Left   Rt > Lt   Pain Descriptors / Indicators Nagging    Pain Type Acute pain;Chronic pain    Pain Radiating Towards LB to bilat hips/buttocks; posterior thighs - pain in bilat heels    Pain Onset 1 to 4 weeks ago    Pain Frequency Constant    Aggravating Factors  standing >1 hour; sitting > 15 min; bending; in and out of car; in and out of the chair; lifting; steps > three flights of stairs    Pain Relieving Factors ice; heat; meds - only slight help              [facility] PT Assessment - 10/03/19 0001      Assessment   Medical Diagnosis LBP with bilat LE pain     Referring Provider (PT) Dr [name]     Onset Date/Surgical Date 09/10/19   history of LBP since 2015   Hand Dominance Right    Next MD Visit to schedule     Prior Therapy yes - here for neck and back       Precautions  Precautions None      Restrictions   Weight Bearing Restrictions No      Balance Screen   Has the patient fallen in the past 6 months Yes    How many times? 1    Has the patient had a decrease in activity level because of a fear of falling?  No    Is the patient reluctant to leave their home because of a fear of falling?  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     Leisure household chores; walking dogs daily ~ 30-40 min       Observation/Other Assessments   Focus on Therapeutic Outcomes (FOTO)  53% limitation       Sensation   Additional Comments numbness and tingling in  bilat heels       Posture/Postural Control   Posture Comments head forward; shoulders rounded and elevated; slight flexed forward at hips; UE's in IR at side       AROM   Right/Left Hip --   tight end ranges bilat    Lumbar Flexion 50% pain bilat LB    Lumbar Extension 50% no pain     Lumbar - Right Side Bend 85% no pain     Lumbar - Left Side Bend 80% discomfort Lt LB to hip     Lumbar - Right Rotation 35% pain Lt LBback    Lumbar - Left Rotation 30% pain Lt LB       Strength   Overall Strength Comments functional strength for transfers and ambulation - pain with resistive testing bilat LE's       Flexibility   Hamstrings tight bilat Rt > Lt     Quadriceps tight bilat     ITB tight Rt > Lt     Piriformis tight Rt > Lt       Palpation   Spinal mobility hypomobile through the lumbar spine with CPA mobs     SI assessment  tight Rt > Lt     Palpation comment muscular tightness noted through the Lt > Rt hip flexors; bilat lumbar paraspinals; hip abductors - piriformis/glut med/glut min       Special Tests   Other special tests (-) SLR; FABERS; pain in LB with slump test but no radicular symptoms       Ambulation/Gait   Gait Comments antalgic gait                       Objective measurements completed on examination: See above findings.       [facility] Adult PT Treatment/Exercise - 10/03/19 0001      Lumbar Exercises: Stretches   Standing Extension 2 reps   2-3 sec    Press Ups 10 reps   2-3 sec hold pain free range    Piriformis Stretch Right;Left;2 reps;30 seconds   supine travell      Moist Heat Therapy   Number Minutes Moist Heat 15 Minutes    Moist Heat Location  Lumbar Spine;Hip      Electrical Stimulation   Electrical Stimulation Location bilat lumbar to bilat posterior hips     Electrical Stimulation Action IFC    Electrical Stimulation Parameters to tolerance     Electrical Stimulation Goals Pain;Tone                  PT Education -  10/03/19 1243    Education Details HEP POC TENs    Person(s) Educated Patient    Methods Explanation;Demonstration;Tactile cues;Verbal cues;Handout    Comprehension Verbalized understanding;Returned demonstration;Verbal cues required;Tactile cues required               PT Long Term Goals - 10/03/19 1256      PT LONG TERM GOAL #1   Title independent with HEP    Time 6    Period Weeks    Status New    Target Date 11/14/19      PT LONG TERM GOAL #2   Title demo painfree Lumbar ROM WFL    Time 6    Period Weeks    Status New    Target Date 11/14/19      PT LONG TERM GOAL #3   Title report ability to sit/stand > 20 min without increase in pain for improved function and work responsibilities    Time 6    Period Weeks    Status New    Target Date 11/14/19      PT LONG TERM GOAL #4   Title demonstrate proper body mechanics to decrease risk of reinjury with work responsibilities    Time 6    Period Weeks    Status New    Target Date 11/14/19      PT LONG TERM GOAL #5   Title FOTO score improved to </= 41% limited    Time 6    Period Weeks    Status New    Target Date 11/14/19                  Plan - 10/03/19 1247    Clinical Impression Statement Patient presents with 3 week history of LBP and bilat LE pain following fall at home. She has a history of microdiscetomy 2015 followed by repeated episodes of LBP requiring therapy and medical intervention. Patient has poor posture and alignment; antalgic gait; difficulty with transfers and transitional movement; limited lumbar and LE ROM/mobility; pain limiting functional and work tasks. patient will benefit from PT to address problems identified.    Stability/Clinical Decision Making Stable/Uncomplicated    Clinical Decision Making Low    Rehab Potential Good    PT Frequency 2x / week    PT Duration 6 weeks    PT Treatment/Interventions Patient/family education;ADLs/Self Care Home Management;Aquatic  Therapy;Cryotherapy;Electrical Stimulation;Iontophoresis 4m/ml Dexamethasone;Moist Heat;Ultrasound;Gait training;Stair training;Functional mobility training;Therapeutic activities;Therapeutic exercise;Balance training;Neuromuscular re-education;Manual techniques;Dry needling;Taping    PT Next Visit Plan review HEP; progress with extension based exercise program; review of body mechanics and back care principles. manual work and modalities as indicated    [facility]    Consulted and Agree with Plan of Care Patient           Patient will benefit from skilled therapeutic intervention in order to improve the following deficits and impairments:  Abnormal gait, Decreased range of motion, Difficulty walking, Increased fascial restricitons, Decreased activity tolerance, Pain, Improper body mechanics, Decreased mobility, Decreased strength, Postural dysfunction  Visit Diagnosis: Acute bilateral low back pain with bilateral  sciatica - Plan: PT plan of care cert/re-cert  Muscle weakness (generalized) - Plan: PT plan of care cert/re-cert  Other muscle spasm - Plan: PT plan of care cert/re-cert  Abnormal posture - Plan: PT plan of care cert/re-cert     Problem List Patient Active Problem List   Diagnosis Date Noted  . Constipation 07/04/2019  . Abdominal pain 07/04/2019  . ANA positive 12/30/2017  . Fibromyalgia 11/03/2017  . Colitis 06/17/2017  . Left ovarian cyst 06/17/2017  . Left shoulder pain 09/07/2016  . Degenerative tear of acetabular labrum of left hip 07/15/2016  . Ganglion cyst 02/17/2016  . Generalized anxiety disorder 02/17/2016  . Recurrent sinusitis 02/17/2016  . Vitamin D deficiency 02/15/2014  . Asthma, chronic 12/14/2012  . GERD (gastroesophageal reflux disease) 12/14/2012  . Seasonal allergies 12/14/2012  . Anxiety and depression 12/14/2012  . Transaminitis 12/14/2012  . Myalgia and myositis 10/30/2012  . Other malaise and fatigue 10/26/2012   . Flushing 10/26/2012  . Insomnia 08/13/2012  . Stress and adjustment reaction 08/13/2012    Leona Pressly Nilda Simmer PT, MPH  10/03/2019, 1:00 PM  [facility] [place], [place], [address] Phone: [phone]   Fax:  [phone]  Name: Tracey Patterson MRN: [id number] Date of Birth: Jan 30, 1981

## 2019-10-03 NOTE — Patient Instructions (Signed)
Access Code: [email or web address]: [email or web address]: 07/07/2021Prepared by: Jonice Cerra HoltExercises  Prone Press Up - 2 x daily - 7 x weekly - 1 sets - 3 reps - 30 sec hold  Supine Piriformis Stretch with Leg Straight - 2 x daily - 7 x weekly - 1 sets - 3 reps - 30 sec hold  Supine Transversus Abdominis Bracing with Pelvic Floor Contraction - 2 x daily - 7 x weekly - 1 sets - 10 reps - 10sec hold Patient Education  TENS Unit   Backward Bend (Standing)    Arch backward to make hollow of back deeper. Hold _1-2 ___ seconds. Repeat __2-3__ times per set. Do _several___ sessions per day.

## 2019-10-10 ENCOUNTER — Other Ambulatory Visit: Payer: Self-pay

## 2019-10-10 ENCOUNTER — Ambulatory Visit (INDEPENDENT_AMBULATORY_CARE_PROVIDER_SITE_OTHER): Payer: No Typology Code available for payment source | Admitting: Gastroenterology

## 2019-10-10 ENCOUNTER — Ambulatory Visit: Payer: No Typology Code available for payment source | Admitting: Physical Therapy

## 2019-10-10 ENCOUNTER — Encounter: Payer: Self-pay | Admitting: Physical Therapy

## 2019-10-10 ENCOUNTER — Encounter: Payer: Self-pay | Admitting: Gastroenterology

## 2019-10-10 VITALS — BP 122/76 | HR 110 | Ht 65.5 in | Wt 198.5 lb

## 2019-10-10 DIAGNOSIS — M62838 Other muscle spasm: Secondary | ICD-10-CM

## 2019-10-10 DIAGNOSIS — M5441 Lumbago with sciatica, right side: Secondary | ICD-10-CM | POA: Diagnosis not present

## 2019-10-10 DIAGNOSIS — K5900 Constipation, unspecified: Secondary | ICD-10-CM | POA: Diagnosis not present

## 2019-10-10 DIAGNOSIS — M5442 Lumbago with sciatica, left side: Secondary | ICD-10-CM

## 2019-10-10 DIAGNOSIS — M6281 Muscle weakness (generalized): Secondary | ICD-10-CM | POA: Diagnosis not present

## 2019-10-10 DIAGNOSIS — R9389 Abnormal findings on diagnostic imaging of other specified body structures: Secondary | ICD-10-CM | POA: Diagnosis not present

## 2019-10-10 LAB — HM MAMMOGRAPHY

## 2019-10-10 NOTE — Progress Notes (Signed)
Chief Complaint:   Referring Provider:  Emeterio Reeve, DO      ASSESSMENT AND PLAN;   #1. IBS-C (prev diarrhea).  Exacerbated by medications. Nl TSH  #2.  Abn CT AP 2019 showing thickening of the sigmoid colon.  Plan: - Increase water intake. - Minimize pain medications including OTC NSAIDs. - Start an exercising-like walking for at least 15-20 minutes 3 times a week. - Change linzess to  290 mcg po QD (samples given) - Call us if still with problems in 2 weeks.  All contact numbers given. - FU in 12 weeks. - Colon after 2 day prep.  - Check celiac panel at the time of colonoscopy.  Discussed risks & benefits. (Risks including rare perforation req laparotomy, bleeding after bx/polypectomy req blood transfusion, rarely missing neoplasms, risks of anesthesia/sedation). Benefits outweigh the risks. Patient agrees to proceed. All the questions were answered. Consent forms given for review.      HPI:    Tracey Patterson is a 39 y.o. female  With history of GERD (on protonix), asthma, arthritis, chronic headaches, anxiety/depression/fibromyalgia, obesity, S/P back surgery C/O constipation x more so since teenage years " for as long as she can remember".  Even as a child, she was given laxatives.  This is associated with abdominal bloating, pellet-like stools, gen abdominal discomfort which does get better with defecation.  Currently having 1 BM/per week.  Has gone as long as 10 days without any BMs.  Over 2 years ago she had diarrhea and then would have bowel movements 1/day mostly watery in consistency.  She was diagnosed as having colitis at that time.  Denies having any melena or hematochezia.  There is some mucus in the stools.    She was given a trial of amitriptyline in the past which made her constipation worse.  She stopped taking amitriptyline.    Does take ibuprofen 3/week.  No calcium.    Has tried multiple medications including stool softeners, Dulcolax,  over-the-counter MiraLAX without any significant benefit.    Had had normal TSH previously.  Denies having any history suggestive of food intolerance including lactose intolerance.  Given trial of Linzess 145 with some relief initially and then again started having more constipation.  No heartburn as long as she is takes Protonix.  No nausea or vomiting.  No weight loss.  CT AP 2019 IMPRESSION: 1. Mild wall thickening of the distal sigmoid colon and rectum with mild adjacent stranding suggesting colitis/proctocolitis. 2. Left ovarian cyst measuring 3.4 cm, possibly a hemorrhagic cyst. Given left lower quadrant pain, consider further evaluation with pelvic ultrasound. 3. Intrauterine device appropriately positioned in the uterus.  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 Past Medical History:  Diagnosis Date  . Anxiety   . Arthritis   . Asthma   . Chronic headache   . Depression   . Fibromyalgia   . GERD (gastroesophageal reflux disease)   . History of TMJ disorder   . IBS (irritable bowel syndrome)   . Obesity   . Pneumonia     Past Surgical History:  Procedure Laterality Date  . BACK SURGERY  December 2015  . KNEE ARTHROSCOPY  1998   left  . LAPAROSCOPIC TUBAL LIGATION  02/28/2012   Procedure: LAPAROSCOPIC TUBAL LIGATION;  Surgeon: Cyril Mourning, MD;  Location: [facility];  Service: Gynecology;  Laterality: Bilateral;  . TONSILLECTOMY      Family History  Problem Relation Age of Onset  . Hypertension Mother   .  Depression Mother   . Hypertension Father   . Breast cancer Maternal Aunt   . Prostate cancer Maternal Uncle   . Prostate cancer Maternal Uncle   . Colon cancer Maternal Uncle   . Esophageal cancer Neg Hx     Social History   Tobacco Use  . Smoking status: Never Smoker  . Smokeless tobacco: Never Used  Vaping Use  . Vaping Use: Never used  Substance Use Topics  . Alcohol use: Yes  . Drug use: No    Current Outpatient Medications  Medication Sig  Dispense Refill  . Albuterol Sulfate (PROAIR RESPICLICK) 937 (90 Base) MCG/ACT AEPB Inhale 1-2 puffs into the lungs every 4 (four) hours as needed (cough/wheeze). 1 each 1  . baclofen (LIORESAL) 10 MG tablet Take 1-2 tablets (10-20 mg total) by mouth 3 (three) times daily as needed for muscle spasms. 90 each 1  . buPROPion (WELLBUTRIN XL) 300 MG 24 hr tablet TAKE 1 TABLET (300 MG TOTAL) BY MOUTH DAILY. 90 tablet 0  . cetirizine (ZYRTEC) 10 MG tablet Take 1 tablet (10 mg total) by mouth daily. 30 tablet 3  . clonazePAM (KLONOPIN) 0.5 MG tablet Take 0.5-1 tablets (0.25-0.5 mg total) by mouth 2 (two) times daily as needed for anxiety. 30 tablet 0  . diclofenac sodium (VOLTAREN) 1 % GEL Apply 4 g topically 4 (four) times daily. To affected joint. 100 g 11  . fluticasone (FLONASE) 50 MCG/ACT nasal spray Place 2 sprays into both nostrils daily. 16 g 6  . gabapentin (NEURONTIN) 300 MG capsule Take 1 capsule (300 mg total) by mouth 3 (three) times daily as needed (nerve pain). 90 capsule 3  . IBUPROFEN PO Take 800 mg by mouth as needed.     . linaclotide (LINZESS) 145 MCG CAPS capsule Take 1 capsule (145 mcg total) by mouth daily. 90 capsule 1  . pantoprazole (PROTONIX) 40 MG tablet TAKE 1 TABLET (40 MG TOTAL) BY MOUTH 2 (TWO) TIMES DAILY. 180 tablet 0  . amitriptyline (ELAVIL) 50 MG tablet Take 1-2 tablets (50-100 mg total) by mouth at bedtime as needed for sleep (pain). (Patient not taking: Reported on 10/10/2019) 180 tablet 2   No current facility-administered medications for this visit.    Allergies  Allergen Reactions  . Sulfa Antibiotics Hives  . Zoloft [Sertraline Hcl] Other (See Comments)    Hair loss  . Crab [Shellfish Allergy] Other (See Comments)    Allergist advises against eating crab.  . Other Rash    Dermabond, steri strips    Review of Systems:  Constitutional: Denies fever, chills, diaphoresis, appetite change and has fatigue.  HEENT: Has allergies. Respiratory: Denies SOB,  DOE, cough, chest tightness,  and wheezing.   Cardiovascular: Denies chest pain, palpitations and leg swelling.  Genitourinary: Denies dysuria, urgency, frequency, hematuria, flank pain and difficulty urinating.  Musculoskeletal: Has myalgias, has back pain, No joint swelling, arthralgias and gait problem.  Has arthritis. Skin: No rash.  Neurological: Denies dizziness, seizures, syncope, weakness, light-headedness, numbness and has  headaches.  Hematological: Denies adenopathy. Easy bruising, personal or family bleeding history  Psychiatric/Behavioral: Has anxiety or depression.  Has sleeping problems at times.     Physical Exam:    BP 122/76   Pulse (!) 110   Ht 5' 5.5" (1.664 m)   Wt 198 lb 8 oz (90 kg)   BMI 32.53 kg/m  Wt Readings from Last 3 Encounters:  10/10/19 198 lb 8 oz (90 kg)  09/26/19 197 lb 3.2  oz (89.4 kg)  07/04/19 201 lb (91.2 kg)   Constitutional:  Well-developed, in no acute distress. Psychiatric: Normal mood and affect. Behavior is normal. HEENT: Pupils normal.  Conjunctivae are normal. No scleral icterus. Neck supple.  Cardiovascular: Normal rate, regular rhythm. No edema Pulmonary/chest: Effort normal and breath sounds normal. No wheezing, rales or rhonchi. Abdominal: Soft, nondistended. Nontender. Bowel sounds active throughout. There are no masses palpable. No hepatomegaly.  Mild generalized abdominal wall tenderness.  Left lower quadrant abdominal tenderness without rebound Rectal:  defered Neurological: Alert and oriented to person place and time. Skin: Skin is warm and dry. No rashes noted.  Data Reviewed: I have personally reviewed following labs and imaging studies  CBC: CBC Latest Ref Rng & Units 09/02/2017 06/16/2017 02/26/2016  WBC 3.8 - 10.8 Thousand/uL 7.5 13.5(H) 11.6(H)  Hemoglobin 11.7 - 15.5 g/dL 13.1 13.0 13.9  Hematocrit 35 - 45 % 38.6 38.9 41.4  Platelets 140 - 400 Thousand/uL 427(H) 387 398    CMP: CMP Latest Ref Rng & Units  09/02/2017 06/16/2017 02/26/2016  Glucose 65 - 99 mg/dL 88 91 83  BUN 7 - 25 mg/dL 13 12 8   Creatinine 0.50 - 1.10 mg/dL 0.82 0.73 0.97  Sodium 135 - 146 mmol/L 137 137 138  Potassium 3.5 - 5.3 mmol/L 4.3 4.0 4.3  Chloride 98 - 110 mmol/L 104 109 104  CO2 20 - 32 mmol/L 25 20(L) 24  Calcium 8.6 - 10.2 mg/dL 9.3 9.3 9.1  Total Protein 6.1 - 8.1 g/dL 6.9 - 6.8  Total Bilirubin 0.2 - 1.2 mg/dL 0.4 - 0.6  Alkaline Phos 33 - 115 U/L - - 98  AST 10 - 30 U/L 28 - 17  ALT 6 - 29 U/L 64(H) - 32(H)  Reviewed CT with the patient.   Carmell Austria, MD 10/10/2019, 10:34 AM  Cc: Emeterio Reeve, DO

## 2019-10-10 NOTE — Patient Instructions (Addendum)
If you are age 41 or older, your body mass index should be between 23-30. Your Body mass index is 32.53 kg/m. If this is out of the aforementioned range listed, please consider follow up with your Primary Care Provider.  If you are age 64 or younger, your body mass index should be between 19-25. Your Body mass index is 32.53 kg/m. If this is out of the aformentioned range listed, please consider follow up with your Primary Care Provider.   You have been scheduled for a colonoscopy. Please follow written instructions given to you at your visit today.  Please pick up your prep supplies at the pharmacy within the next 1-3 days. If you use inhalers (even only as needed), please bring them with you on the day of your procedure. Your physician has requested that you go to www.startemmi.com and enter the access code given to you at your visit today. This web site gives a general overview about your procedure. However, you should still follow specific instructions given to you by our office regarding your preparation for the procedure.  Increase water intake.  Minimize pain medications including over-the-counter  NSAIDS.  Start exercising - walking at least 15-20 minutes three times a week.  You have been given Linzess 290 mcg samples.  Call if still having problems in two weeks.  Follow up in three months. Please call the office for an appointment as the schedule is not available at this time.  Thank you for choosing me and [facility].  Jackquline Denmark, md

## 2019-10-10 NOTE — Therapy (Signed)
[place], [place], [address] Phone: [phone]   Fax:  [phone]  Physical Therapy Treatment  Patient Details  Name: Tracey Patterson MRN: [id number] Date of Birth: [birth year] Referring Provider (PT): Dr [name]    Encounter Date: 10/10/2019   PT End of Session - 10/10/19 0716    Visit Number 2    Number of Visits 12    Date for PT Re-Evaluation 11/14/19    PT Start Time 0716    PT Stop Time 0801    PT Time Calculation (min) 45 min    Activity Tolerance Patient tolerated treatment well    Behavior During Therapy [facility] for tasks assessed/performed           Past Medical History:  Diagnosis Date  . Anxiety   . Asthma   . History of TMJ disorder     Past Surgical History:  Procedure Laterality Date  . BACK SURGERY  December 2015  . KNEE ARTHROSCOPY  1998   left  . LAPAROSCOPIC TUBAL LIGATION  02/28/2012   Procedure: LAPAROSCOPIC TUBAL LIGATION;  Surgeon: Cyril Mourning, MD;  Location: [facility];  Service: Gynecology;  Laterality: Bilateral;  . TONSILLECTOMY      There were no vitals filed for this visit.   Subjective Assessment - 10/10/19 0718    Subjective Pt reports she bought a new TENS unit and has used it once. "The pain radiating down bilat LEs hasn't been as intense."    Patient Stated Goals to be able to clean house; decrease pain; ride in the car > 30 min without increased pain; work a shift with only moderate pain    Currently in Pain? Yes    Pain Score 6     Pain Location Back    Pain Orientation Right    Pain Descriptors / Indicators Sharp    Aggravating Factors  prolonged positions, bending,    Pain Relieving Factors ice, heat, TENS              [facility] PT Assessment - 10/10/19 0001      Assessment   Medical Diagnosis LBP with bilat LE pain     Referring Provider (PT) Dr [name]     Onset Date/Surgical Date 09/10/19   history of LBP since 2015   Hand  Dominance Right    Next MD Visit to schedule     Prior Therapy yes - here for neck and back       Palpation   SI assessment  Lt ASIS higher than Rt;  slight Lt sacral torsion; Rt PSIS higher than Lt.     Palpation comment Lt ASIS point tender            [facility] Adult PT Treatment/Exercise - 10/10/19 0001      Self-Care   Self-Care Other Self-Care Comments    Other Self-Care Comments  verbally reviewed self-massage with ball.       Lumbar Exercises: Stretches   Passive Hamstring Stretch Right;Left;2 reps;30 seconds   supine with strap, opp knee flexed.    Press Ups 3 reps    Piriformis Stretch Right;Left;1 rep;20 seconds      Lumbar Exercises: Aerobic   Nustep L5: 5.5 min (arms/Legs)      Lumbar Exercises: Seated   Sit to Stand 5 reps   core engaged; Vc for eccentric lowering     Lumbar Exercises: Supine   Bridge with Ball Squeeze 10 reps;2 seconds  Other Supine Lumbar Exercises MET to correct rotation in pelvis- Rt hip flex/Lt hip ext (legs over dowel pressing in those directions), 5 sec hold x 5 reps (followed by bridges)       Lumbar Exercises: Prone   Opposite Arm/Leg Raise Right arm/Left leg;Left arm/Right leg;5 reps;2 seconds      Lumbar Exercises: Quadruped   Madcat/Old Horse 10 reps    Madcat/Old Horse Limitations and wag the tail, side side motion    Other Quadruped Lumbar Exercises childs pose with buttocks in air x 3 reps, 10 sec       Modalities   Modalities Cryotherapy;Electrical Stimulation;Iontophoresis      Cryotherapy   Number Minutes Cryotherapy 10 Minutes    Cryotherapy Location Lumbar Spine    Type of Cryotherapy Ice pack      Electrical Stimulation   Electrical Stimulation Location bilat lumbar/ SI    Electrical Stimulation Action IFC    Electrical Stimulation Parameters 10 min, intensity to tolerance     Electrical Stimulation Goals Pain      Iontophoresis   Type of Iontophoresis Dexamethasone    Location Rt SI joint    Dose 1.0 cc     Time 51m stat patch (4 hr wear time)                       PT Long Term Goals - 10/03/19 1256      PT LONG TERM GOAL #1   Title independent with HEP    Time 6    Period Weeks    Status New    Target Date 11/14/19      PT LONG TERM GOAL #2   Title demo painfree Lumbar ROM WFL    Time 6    Period Weeks    Status New    Target Date 11/14/19      PT LONG TERM GOAL #3   Title report ability to sit/stand > 20 min without increase in pain for improved function and work responsibilities    Time 6    Period Weeks    Status New    Target Date 11/14/19      PT LONG TERM GOAL #4   Title demonstrate proper body mechanics to decrease risk of reinjury with work responsibilities    Time 6    Period Weeks    Status New    Target Date 11/14/19      PT LONG TERM GOAL #5   Title FOTO score improved to </= 41% limited    Time 6    Period Weeks    Status New    Target Date 11/14/19                 Plan - 10/10/19 0755    Clinical Impression Statement Pt presents with minor pelvis asymmetries; limited correction with MET exercise.  [facility]  Otherwise, all other exercises tolerated well.  Goals are ongoing.    Stability/Clinical Decision Making Stable/Uncomplicated    Rehab Potential Good    PT Frequency 2x / week    PT Duration 6 weeks    PT Treatment/Interventions Patient/family education;ADLs/Self Care Home Management;Aquatic Therapy;Cryotherapy;Electrical Stimulation;Iontophoresis 471mml Dexamethasone;Moist Heat;Ultrasound;Gait training;Stair training;Functional mobility training;Therapeutic activities;Therapeutic exercise;Balance training;Neuromuscular re-education;Manual techniques;Dry needling;Taping    PT Next Visit Plan assess response to ionto.  review body mechanics.  cont spinal stabilization / core exercises.    PT Home Exercise Plan  [id number]; VHI    Consulted and Agree with Plan of Care Patient            Patient will benefit from skilled therapeutic intervention in order to improve the following deficits and impairments:  Abnormal gait, Decreased range of motion, Difficulty walking, Increased fascial restricitons, Decreased activity tolerance, Pain, Improper body mechanics, Decreased mobility, Decreased strength, Postural dysfunction  Visit Diagnosis: Acute bilateral low back pain with bilateral sciatica  Muscle weakness (generalized)  Other muscle spasm     Problem List Patient Active Problem List   Diagnosis Date Noted  . Constipation 07/04/2019  . Abdominal pain 07/04/2019  . ANA positive 12/30/2017  . Fibromyalgia 11/03/2017  . Colitis 06/17/2017  . Left ovarian cyst 06/17/2017  . Left shoulder pain 09/07/2016  . Degenerative tear of acetabular labrum of left hip 07/15/2016  . Ganglion cyst 02/17/2016  . Generalized anxiety disorder 02/17/2016  . Recurrent sinusitis 02/17/2016  . Vitamin D deficiency 02/15/2014  . Asthma, chronic 12/14/2012  . GERD (gastroesophageal reflux disease) 12/14/2012  . Seasonal allergies 12/14/2012  . Anxiety and depression 12/14/2012  . Transaminitis 12/14/2012  . Myalgia and myositis 10/30/2012  . Other malaise and fatigue 10/26/2012  . Flushing 10/26/2012  . Insomnia 08/13/2012  . Stress and adjustment reaction 08/13/2012   Kerin Perna, PTA 10/10/19 8:07 AM  [place], [place], [address] Phone: [phone]   Fax:  [phone]  Name: Jaylena Holloway MRN: [id number] Date of Birth: [birth year]

## 2019-10-12 ENCOUNTER — Ambulatory Visit (INDEPENDENT_AMBULATORY_CARE_PROVIDER_SITE_OTHER): Payer: No Typology Code available for payment source | Admitting: Rehabilitative and Restorative Service Providers"

## 2019-10-12 ENCOUNTER — Other Ambulatory Visit: Payer: Self-pay

## 2019-10-12 ENCOUNTER — Encounter: Payer: Self-pay | Admitting: Rehabilitative and Restorative Service Providers"

## 2019-10-12 DIAGNOSIS — M62838 Other muscle spasm: Secondary | ICD-10-CM

## 2019-10-12 DIAGNOSIS — R293 Abnormal posture: Secondary | ICD-10-CM

## 2019-10-12 DIAGNOSIS — M5442 Lumbago with sciatica, left side: Secondary | ICD-10-CM

## 2019-10-12 DIAGNOSIS — M6281 Muscle weakness (generalized): Secondary | ICD-10-CM | POA: Diagnosis not present

## 2019-10-12 DIAGNOSIS — G8929 Other chronic pain: Secondary | ICD-10-CM

## 2019-10-12 DIAGNOSIS — M5441 Lumbago with sciatica, right side: Secondary | ICD-10-CM

## 2019-10-12 NOTE — Therapy (Signed)
[place], [place], [address] Phone: [phone]   Fax:  [phone]  Physical Therapy Treatment  Patient Details  Name: Tracey Patterson MRN: [id number] Date of Birth: [birth year] Referring Provider (PT): Dr [name]    Encounter Date: 10/12/2019   PT End of Session - 10/12/19 1458    Visit Number 3    Number of Visits 12    Date for PT Re-Evaluation 11/14/19    PT Start Time 8003    PT Stop Time 4917    PT Time Calculation (min) 50 min    Activity Tolerance Patient tolerated treatment well           Past Medical History:  Diagnosis Date  . Anxiety   . Arthritis   . Asthma   . Chronic headache   . Depression   . Fibromyalgia   . GERD (gastroesophageal reflux disease)   . History of TMJ disorder   . IBS (irritable bowel syndrome)   . Obesity   . Pneumonia     Past Surgical History:  Procedure Laterality Date  . BACK SURGERY  December 2015  . KNEE ARTHROSCOPY  1998   left  . LAPAROSCOPIC TUBAL LIGATION  02/28/2012   Procedure: LAPAROSCOPIC TUBAL LIGATION;  Surgeon: Cyril Mourning, MD;  Location: [facility];  Service: Gynecology;  Laterality: Bilateral;  . TONSILLECTOMY      There were no vitals filed for this visit.   Subjective Assessment - 10/12/19 1501    Subjective Used TENS one more time since last visit with some relief of pain. No radicular pain in the past 2 days.    Currently in Pain? Yes    Pain Score 2     Pain Location Back    Pain Orientation Right    Pain Descriptors / Indicators Sharp    Pain Type Acute pain;Chronic pain                             [facility] Adult PT Treatment/Exercise - 10/12/19 0001      Lumbar Exercises: Standing   Row Strengthening;Both;20 reps;Theraband    Theraband Level (Row) Level 4 (Blue)    Shoulder Extension Strengthening;Both;20 reps;Theraband    Theraband Level (Shoulder Extension) Level 4 (Blue)    Other  Standing Lumbar Exercises SLS 30 sec x 3 each LE UE touch for balance as needed     Other Standing Lumbar Exercises hinged T bending forward maintaining lumbar position avoiding rotation of back x 10 each LE required VC/TC to maintain position challenging       Lumbar Exercises: Seated   Sit to Stand 10 reps   VC for very slow stand to sit hinging hips core tight   Sit to Stand Limitations challenging stand to sit slowly       Cryotherapy   Number Minutes Cryotherapy 15 Minutes    Cryotherapy Location Lumbar Spine    Type of Cryotherapy Ice pack      Electrical Stimulation   Electrical Stimulation Location bilat lumbar/ SI    Electrical Stimulation Action IFC    Electrical Stimulation Parameters to tolerance     Electrical Stimulation Goals Pain      Iontophoresis   Type of Iontophoresis Dexamethasone    Location Rt SI joint    Dose 80 mAmp; 1.0 cc    Time 4 hr  PT Education - 10/12/19 1521    Education Details HEP    Person(s) Educated Patient    Methods Explanation;Demonstration;Tactile cues;Verbal cues;Handout    Comprehension Verbalized understanding;Returned demonstration;Verbal cues required;Tactile cues required               PT Long Term Goals - 10/03/19 1256      PT LONG TERM GOAL #1   Title independent with HEP    Time 6    Period Weeks    Status New    Target Date 11/14/19      PT LONG TERM GOAL #2   Title demo painfree Lumbar ROM WFL    Time 6    Period Weeks    Status New    Target Date 11/14/19      PT LONG TERM GOAL #3   Title report ability to sit/stand > 20 min without increase in pain for improved function and work responsibilities    Time 6    Period Weeks    Status New    Target Date 11/14/19      PT LONG TERM GOAL #4   Title demonstrate proper body mechanics to decrease risk of reinjury with work responsibilities    Time 6    Period Weeks    Status New    Target Date 11/14/19      PT LONG TERM GOAL #5     Title FOTO score improved to </= 41% limited    Time 6    Period Weeks    Status New    Target Date 11/14/19                 Plan - 10/12/19 1459    Clinical Impression Statement Good improvement in pain in bilat LE's, some persistent painin the LB. She has increased pain with lifting from the floor either squatting or bending forward and riding in the car. Continued with stretching and strengthening. Good centralization of symptoms.    Rehab Potential Good    PT Frequency 2x / week    PT Duration 6 weeks    PT Treatment/Interventions Patient/family education;ADLs/Self Care Home Management;Aquatic Therapy;Cryotherapy;Electrical Stimulation;Iontophoresis 74m/ml Dexamethasone;Moist Heat;Ultrasound;Gait training;Stair training;Functional mobility training;Therapeutic activities;Therapeutic exercise;Balance training;Neuromuscular re-education;Manual techniques;Dry needling;Taping    PT Next Visit Plan review body mechanics.  cont spinal stabilization / core exercises; manual work; iMaterials engineer modalities as indicated    PT [facility] VHI    Consulted and Agree with Plan of Care Patient           Patient will benefit from skilled therapeutic intervention in order to improve the following deficits and impairments:     Visit Diagnosis: Acute bilateral low back pain with bilateral sciatica  Muscle weakness (generalized)  Other muscle spasm  Abnormal posture  Chronic right-sided low back pain with right-sided sciatica     Problem List Patient Active Problem List   Diagnosis Date Noted  . Constipation 07/04/2019  . Abdominal pain 07/04/2019  . ANA positive 12/30/2017  . Fibromyalgia 11/03/2017  . Colitis 06/17/2017  . Left ovarian cyst 06/17/2017  . Left shoulder pain 09/07/2016  . Degenerative tear of acetabular labrum of left hip 07/15/2016  . Ganglion cyst 02/17/2016  . Generalized anxiety disorder 02/17/2016  . Recurrent sinusitis 02/17/2016  .  Vitamin D deficiency 02/15/2014  . Asthma, chronic 12/14/2012  . GERD (gastroesophageal reflux disease) 12/14/2012  . Seasonal allergies 12/14/2012  . Anxiety and depression 12/14/2012  . Transaminitis 12/14/2012  . Myalgia  and myositis 10/30/2012  . Other malaise and fatigue 10/26/2012  . Flushing 10/26/2012  . Insomnia 08/13/2012  . Stress and adjustment reaction 08/13/2012    Tracey Patterson PT, MPH  10/12/2019, 3:40 PM  [facility] [place], [place], [address] Phone: [phone]   Fax:  [phone]  Name: Tracey Patterson MRN: [id number] Date of Birth: 26-May-1980

## 2019-10-12 NOTE — Patient Instructions (Signed)
Access Code: [email or web address]: [email or web address]: 07/16/2021Prepared by: Jamian Andujo HoltExercises  Prone Press Up - 2 x daily - 7 x weekly - 1 sets - 3 reps - 30 sec hold  Supine Piriformis Stretch with Leg Straight - 2 x daily - 7 x weekly - 1 sets - 3 reps - 30 sec hold  Supine Transversus Abdominis Bracing with Pelvic Floor Contraction - 2 x daily - 7 x weekly - 1 sets - 10 reps - 10sec hold  Single Leg Stance - 2 x daily - 7 x weekly - 1 sets - 3 reps - 30 sec hold  Forward T - 2 x daily - 7 x weekly - 1-2 sets - 10 reps - 2-3 sec hold  Standing Bilateral Low Shoulder Row with Anchored Resistance - 2 x daily - 7 x weekly - 1-2 sets - 10 reps - 2-3 sec hold  Shoulder extension with resistance - Neutral - 2 x daily - 7 x weekly - 1-2 sets - 10 reps - 2-3 sec hold

## 2019-10-16 ENCOUNTER — Encounter: Payer: Self-pay | Admitting: Physical Therapy

## 2019-10-16 ENCOUNTER — Other Ambulatory Visit: Payer: Self-pay | Admitting: Osteopathic Medicine

## 2019-10-16 DIAGNOSIS — K219 Gastro-esophageal reflux disease without esophagitis: Secondary | ICD-10-CM

## 2019-10-16 MED FILL — PANTOPRAZOLE SOD DR 40 MG T: 40 | 90 days supply | Qty: 180 | Fill #0

## 2019-10-18 ENCOUNTER — Encounter: Payer: Self-pay | Admitting: Physical Therapy

## 2019-10-18 ENCOUNTER — Ambulatory Visit (INDEPENDENT_AMBULATORY_CARE_PROVIDER_SITE_OTHER): Payer: No Typology Code available for payment source | Admitting: Physical Therapy

## 2019-10-18 ENCOUNTER — Other Ambulatory Visit: Payer: Self-pay

## 2019-10-18 DIAGNOSIS — M5442 Lumbago with sciatica, left side: Secondary | ICD-10-CM | POA: Diagnosis not present

## 2019-10-18 DIAGNOSIS — R293 Abnormal posture: Secondary | ICD-10-CM

## 2019-10-18 DIAGNOSIS — M62838 Other muscle spasm: Secondary | ICD-10-CM | POA: Diagnosis not present

## 2019-10-18 DIAGNOSIS — M6281 Muscle weakness (generalized): Secondary | ICD-10-CM

## 2019-10-18 DIAGNOSIS — M5441 Lumbago with sciatica, right side: Secondary | ICD-10-CM

## 2019-10-18 NOTE — Therapy (Signed)
[place], [place], [address] Phone: [phone]   Fax:  [phone]  Physical Therapy Treatment  Patient Details  Name: Tracey Patterson MRN: [id number] Date of Birth: 05-Aug-1980 Referring Provider (PT): Dr [name]    Encounter Date: 10/18/2019   PT End of Session - 10/18/19 1541    Visit Number 4    Number of Visits 12    Date for PT Re-Evaluation 11/14/19    PT Start Time 7014    PT Stop Time 1617    PT Time Calculation (min) 43 min    Activity Tolerance Patient tolerated treatment well    Behavior During Therapy [facility] for tasks assessed/performed           Past Medical History:  Diagnosis Date  . Anxiety   . Arthritis   . Asthma   . Chronic headache   . Depression   . Fibromyalgia   . GERD (gastroesophageal reflux disease)   . History of TMJ disorder   . IBS (irritable bowel syndrome)   . Obesity   . Pneumonia     Past Surgical History:  Procedure Laterality Date  . BACK SURGERY  December 2015  . KNEE ARTHROSCOPY  1998   left  . LAPAROSCOPIC TUBAL LIGATION  02/28/2012   Procedure: LAPAROSCOPIC TUBAL LIGATION;  Surgeon: Cyril Mourning, MD;  Location: [facility];  Service: Gynecology;  Laterality: Bilateral;  . TONSILLECTOMY      There were no vitals filed for this visit.   Subjective Assessment - 10/18/19 1540    Subjective Pt reports she was working on paper for school yesterday and didn't take enough breaks; back is feeling worse.  She reports standing at work also bothers her back.  She has had Rt intermittent radicular symptoms.    Patient Stated Goals to be able to clean house; decrease pain; ride in the car > 30 min without increased pain; work a shift with only moderate pain    Currently in Pain? Yes    Pain Score 5     Pain Location Back    Pain Orientation Right    Pain Descriptors / Indicators Sore    Aggravating Factors  prolonged positions, bending    Pain  Relieving Factors ice, TENS              [facility] PT Assessment - 10/18/19 0001      Assessment   Medical Diagnosis LBP with bilat LE pain     Referring Provider (PT) Dr [name]     Onset Date/Surgical Date 09/10/19   history of LBP since 2015   Hand Dominance Right    Next MD Visit to schedule     Prior Therapy yes - here for neck and back            [facility] Adult PT Treatment/Exercise - 10/18/19 0001      Lumbar Exercises: Stretches   Passive Hamstring Stretch Left;Right;2 reps;30 seconds    Press Ups 5 reps;5 seconds    Quad Stretch Right;2 reps;30 seconds   prone with strap   Piriformis Stretch Right;Left;2 reps;30 seconds    Other Lumbar Stretch Exercise modified downward dog with hands on chair x 10 sec x 2 reps       Lumbar Exercises: Aerobic   Nustep L5: 6 min (arms/Legs)      Lumbar Exercises: Standing   Row Strengthening;Both;20 reps;Theraband    Theraband Level (Row) Level 4 (Blue)  Other Standing Lumbar Exercises anti-rotation with green band x 5 reps each side.       Lumbar Exercises: Seated   Sit to Stand 5 reps   VC for very slow stand to sit hinging hips core tight     Lumbar Exercises: Supine   Bridge with Ball Squeeze 10 reps;3 seconds      Lumbar Exercises: Prone   Opposite Arm/Leg Raise Right arm/Left leg;Left arm/Right leg;10 reps      Lumbar Exercises: Quadruped   [place]/Old Horse 5 reps      Modalities   Modalities --   held; will use ice/TENS at home.                      PT Long Term Goals - 10/03/19 1256      PT LONG TERM GOAL #1   Title independent with HEP    Time 6    Period Weeks    Status New    Target Date 11/14/19      PT LONG TERM GOAL #2   Title demo painfree Lumbar ROM WFL    Time 6    Period Weeks    Status New    Target Date 11/14/19      PT LONG TERM GOAL #3   Title report ability to sit/stand > 20 min without increase in pain for improved function and work responsibilities    Time 6    Period  Weeks    Status New    Target Date 11/14/19      PT LONG TERM GOAL #4   Title demonstrate proper body mechanics to decrease risk of reinjury with work responsibilities    Time 6    Period Weeks    Status New    Target Date 11/14/19      PT LONG TERM GOAL #5   Title FOTO score improved to </= 41% limited    Time 6    Period Weeks    Status New    Target Date 11/14/19                 Plan - 10/18/19 1545    Clinical Impression Statement No improvement in SI pain with ionto patch.  She continues to be challenged with eccentric squats (sit to stand), anti-rotation, and bridges.  Pt reported pain reduction to 2/10 at end of session. Progressing gradually towards LTGs.    Rehab Potential Good    PT Frequency 2x / week    PT Duration 6 weeks    PT Treatment/Interventions Patient/family education;ADLs/Self Care Home Management;Aquatic Therapy;Cryotherapy;Electrical Stimulation;Iontophoresis 76m/ml Dexamethasone;Moist Heat;Ultrasound;Gait training;Stair training;Functional mobility training;Therapeutic activities;Therapeutic exercise;Balance training;Neuromuscular re-education;Manual techniques;Dry needling;Taping    PT Next Visit Plan review body mechanics.  cont spinal stabilization / core exercises; manual work;  modalities as indicated    [facility]    Consulted and Agree with Plan of Care Patient           Patient will benefit from skilled therapeutic intervention in order to improve the following deficits and impairments:  Abnormal gait, Decreased range of motion, Difficulty walking, Increased fascial restricitons, Decreased activity tolerance, Pain, Improper body mechanics, Decreased mobility, Decreased strength, Postural dysfunction  Visit Diagnosis: Acute bilateral low back pain with bilateral sciatica  Muscle weakness (generalized)  Other muscle spasm  Abnormal posture     Problem List Patient Active Problem List   Diagnosis Date  Noted  . Constipation 07/04/2019  .  Abdominal pain 07/04/2019  . ANA positive 12/30/2017  . Fibromyalgia 11/03/2017  . Colitis 06/17/2017  . Left ovarian cyst 06/17/2017  . Left shoulder pain 09/07/2016  . Degenerative tear of acetabular labrum of left hip 07/15/2016  . Ganglion cyst 02/17/2016  . Generalized anxiety disorder 02/17/2016  . Recurrent sinusitis 02/17/2016  . Vitamin D deficiency 02/15/2014  . Asthma, chronic 12/14/2012  . GERD (gastroesophageal reflux disease) 12/14/2012  . Seasonal allergies 12/14/2012  . Anxiety and depression 12/14/2012  . Transaminitis 12/14/2012  . Myalgia and myositis 10/30/2012  . Other malaise and fatigue 10/26/2012  . Flushing 10/26/2012  . Insomnia 08/13/2012  . Stress and adjustment reaction 08/13/2012   Kerin Perna, PTA 10/18/19 5:31 PM  [place], [place], [address] Phone: [phone]   Fax:  [phone]  Name: Latavia Goga MRN: [id number] Date of Birth: [birth year]

## 2019-10-22 ENCOUNTER — Encounter: Payer: Self-pay | Admitting: Osteopathic Medicine

## 2019-10-23 ENCOUNTER — Encounter: Payer: No Typology Code available for payment source | Admitting: Rehabilitative and Restorative Service Providers"

## 2019-10-26 ENCOUNTER — Ambulatory Visit (INDEPENDENT_AMBULATORY_CARE_PROVIDER_SITE_OTHER): Payer: No Typology Code available for payment source | Admitting: Physical Therapy

## 2019-10-26 ENCOUNTER — Other Ambulatory Visit: Payer: Self-pay

## 2019-10-26 DIAGNOSIS — M5441 Lumbago with sciatica, right side: Secondary | ICD-10-CM

## 2019-10-26 DIAGNOSIS — R293 Abnormal posture: Secondary | ICD-10-CM

## 2019-10-26 DIAGNOSIS — M62838 Other muscle spasm: Secondary | ICD-10-CM | POA: Diagnosis not present

## 2019-10-26 DIAGNOSIS — M5442 Lumbago with sciatica, left side: Secondary | ICD-10-CM

## 2019-10-26 DIAGNOSIS — M6281 Muscle weakness (generalized): Secondary | ICD-10-CM

## 2019-10-26 NOTE — Therapy (Addendum)
[place], [place], [address] Phone: [phone]   Fax:  [phone]  Physical Therapy Treatment  Patient Details  Name: Tracey Patterson MRN: [id number] Date of Birth: [birth year] Referring Provider (PT): Dr [name]    Encounter Date: 10/26/2019   PT End of Session - 10/26/19 1023    Visit Number 5    Number of Visits 12    Date for PT Re-Evaluation 11/14/19    PT Start Time 1019    PT Stop Time 1059    PT Time Calculation (min) 40 min    Activity Tolerance Patient tolerated treatment well    Behavior During Therapy [facility] for tasks assessed/performed           Past Medical History:  Diagnosis Date  . Anxiety   . Arthritis   . Asthma   . Chronic headache   . Depression   . Fibromyalgia   . GERD (gastroesophageal reflux disease)   . History of TMJ disorder   . IBS (irritable bowel syndrome)   . Obesity   . Pneumonia     Past Surgical History:  Procedure Laterality Date  . BACK SURGERY  December 2015  . KNEE ARTHROSCOPY  1998   left  . LAPAROSCOPIC TUBAL LIGATION  02/28/2012   Procedure: LAPAROSCOPIC TUBAL LIGATION;  Surgeon: Cyril Mourning, MD;  Location: [facility];  Service: Gynecology;  Laterality: Bilateral;  . TONSILLECTOMY      There were no vitals filed for this visit.   Subjective Assessment - 10/26/19 1024    Subjective "I think the exercises are helping".  She reports compliance of 1x/day, and stretching at work during work days.  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  She has tried to avoid repetitive bending over (ie: to pick up items off floor)    Patient Stated Goals to be able to clean house; decrease pain; ride in the car > 30 min without increased pain; work a shift with only moderate pain    Currently in Pain? No/denies    Pain Score 0-No pain              [facility] PT Assessment - 10/26/19 0001      Assessment   Medical Diagnosis LBP with bilat  LE pain     Referring Provider (PT) Dr [name]     Onset Date/Surgical Date 09/10/19   history of LBP since 2015   Hand Dominance Right    Next MD Visit PRN    Prior Therapy yes - here for neck and back       AROM   Overall AROM Comments painfree ROM    Lumbar Flexion WNL    Lumbar Extension WNL    Lumbar - Right Side Bend WNL    Lumbar - Left Side Bend WNL    Lumbar - Right Rotation WNL    Lumbar - Left Rotation WNL            [facility] Adult PT Treatment/Exercise - 10/26/19 0001      Lumbar Exercises: Stretches   Passive Hamstring Stretch Left;Right;2 reps;30 seconds    Piriformis Stretch Right;Left;2 reps;30 seconds    Other Lumbar Stretch Exercise trunk ext with hands on counter x 5 sec x 4 reps    Other Lumbar Stretch Exercise modified downward dog with hands on chair x 10 sec x 2 reps       Lumbar Exercises: Aerobic   Nustep  L5: 6 min (arms/Legs)      Lumbar Exercises: Standing   Wall Slides 10 reps;5 seconds    Row Strengthening;Both;15 reps    Theraband Level (Row) Level 3 (Green)    Other Standing Lumbar Exercises anti-rotation (resisted side stepping with pushing out from core) with  green band x 5 reps each side.   reverse wall push ups x 5 reps.  High plank at counter x 30 sec, followed by modified down dog x 5 sec; then plank on counter with alteranting toe taps forward and out to side x 10     Other Standing Lumbar Exercises hinged T bending forward maintaining lumbar position avoiding rotation of back x 5 reps each leg      Modalities   Modalities --   held; will use ice/TENS at home.           PT Long Term Goals - 10/26/19 1026      PT LONG TERM GOAL #1   Title independent with HEP    Time 6    Period Weeks    Status On-going      PT LONG TERM GOAL #2   Title demo painfree Lumbar ROM WFL    Time 6    Period Weeks    Status Achieved      PT LONG TERM GOAL #3   Title report ability to sit/stand > 20 min without increase in pain for improved  function and work responsibilities    Time 6    Period Weeks    Status Achieved      PT LONG TERM GOAL #4   Title demonstrate proper body mechanics to decrease risk of reinjury with work responsibilities    Time 6    Period Weeks    Status On-going      PT LONG TERM GOAL #5   Title FOTO score improved to </= 41% limited    Time 6    Period Weeks    Status On-going                 Plan - 10/26/19 1059    Clinical Impression Statement Good improvement in overall pain level. Pt remained painfree with all exercises today.  Trialed standing strengthening exercises since floor exercises are problematic with pets at home.  Pt has met LTG #2 and #3.    Rehab Potential Good    PT Frequency 2x / week    PT Duration 6 weeks    PT Treatment/Interventions Patient/family education;ADLs/Self Care Home Management;Aquatic Therapy;Cryotherapy;Electrical Stimulation;Iontophoresis 76m/ml Dexamethasone;Moist Heat;Ultrasound;Gait training;Stair training;Functional mobility training;Therapeutic activities;Therapeutic exercise;Balance training;Neuromuscular re-education;Manual techniques;Dry needling;Taping    PT Next Visit Plan FOTO. review body mechanics.  advance HEP.    PT Home Exercise Plan [email or web address]; VHI    Consulted and Agree with Plan of Care Patient           Patient will benefit from skilled therapeutic intervention in order to improve the following deficits and impairments:  Abnormal gait, Decreased range of motion, Difficulty walking, Increased fascial restricitons, Decreased activity tolerance, Pain, Improper body mechanics, Decreased mobility, Decreased strength, Postural dysfunction  Visit Diagnosis: Acute bilateral low back pain with bilateral sciatica  Muscle weakness (generalized)  Other muscle spasm  Abnormal posture     Problem List Patient Active Problem List   Diagnosis Date Noted  . Constipation 07/04/2019  . Abdominal pain 07/04/2019  . ANA positive  12/30/2017  . Fibromyalgia 11/03/2017  . Colitis 06/17/2017  . Left ovarian cyst  06/17/2017  . Left shoulder pain 09/07/2016  . Degenerative tear of acetabular labrum of left hip 07/15/2016  . Ganglion cyst 02/17/2016  . Generalized anxiety disorder 02/17/2016  . Recurrent sinusitis 02/17/2016  . Vitamin D deficiency 02/15/2014  . Asthma, chronic 12/14/2012  . GERD (gastroesophageal reflux disease) 12/14/2012  . Seasonal allergies 12/14/2012  . Anxiety and depression 12/14/2012  . Transaminitis 12/14/2012  . Myalgia and myositis 10/30/2012  . Other malaise and fatigue 10/26/2012  . Flushing 10/26/2012  . Insomnia 08/13/2012  . Stress and adjustment reaction 08/13/2012   Kerin Perna, PTA 10/26/19 11:03 AM  [place], [place], [address] Phone: [phone]   Fax:  [phone]  Name: Tracey Patterson MRN: [id number] Date of Birth: [birth year]  PHYSICAL THERAPY DISCHARGE SUMMARY  Visits from Start of Care: 6  Current functional level related to goals / functional outcomes: See progress note for discharge status    Remaining deficits: Needs to continue consistent exercise to maintain gains    Education / Equipment: HEP  Plan: Patient agrees to discharge.  Patient goals were met. Patient is being discharged due to meeting the stated rehab goals.  ?????    Celyn P. Helene Kelp PT, MPH 01/03/20 1:03 PM

## 2019-10-31 ENCOUNTER — Encounter: Payer: No Typology Code available for payment source | Admitting: Physical Therapy

## 2019-11-02 ENCOUNTER — Encounter: Payer: No Typology Code available for payment source | Admitting: Rehabilitative and Restorative Service Providers"

## 2019-11-08 ENCOUNTER — Encounter: Payer: No Typology Code available for payment source | Admitting: Physical Therapy

## 2019-11-15 ENCOUNTER — Encounter: Payer: Self-pay | Admitting: Gastroenterology

## 2019-11-23 ENCOUNTER — Other Ambulatory Visit: Payer: Self-pay

## 2019-11-23 ENCOUNTER — Ambulatory Visit (AMBULATORY_SURGERY_CENTER): Payer: No Typology Code available for payment source | Admitting: Gastroenterology

## 2019-11-23 ENCOUNTER — Encounter: Payer: Self-pay | Admitting: Gastroenterology

## 2019-11-23 VITALS — BP 119/71 | HR 82 | Temp 97.7°F | Resp 12 | Ht 65.0 in | Wt 198.0 lb

## 2019-11-23 DIAGNOSIS — K581 Irritable bowel syndrome with constipation: Secondary | ICD-10-CM | POA: Diagnosis present

## 2019-11-23 DIAGNOSIS — K5730 Diverticulosis of large intestine without perforation or abscess without bleeding: Secondary | ICD-10-CM | POA: Diagnosis not present

## 2019-11-23 DIAGNOSIS — K5900 Constipation, unspecified: Secondary | ICD-10-CM

## 2019-11-23 MED ORDER — SODIUM CHLORIDE 0.9 % IV SOLN: 500.0000 mL | Freq: Once | INTRAVENOUS | Status: DC

## 2019-11-23 NOTE — Progress Notes (Signed)
JB- front desk  Vs-CW

## 2019-11-23 NOTE — Patient Instructions (Signed)
High fiber diet.  Use Benefiber one teaspoon daily.  Miralax 1 capful (17grams) in 8 ounces of water daily.   YOU HAD AN ENDOSCOPIC PROCEDURE TODAY AT [facility]:   Refer to the procedure report that was given to you for any specific questions about what was found during the examination.  If the procedure report does not answer your questions, please call your gastroenterologist to clarify.  If you requested that your care partner not be given the details of your procedure findings, then the procedure report has been included in a sealed envelope for you to review at your convenience later.  YOU SHOULD EXPECT: Some feelings of bloating in the abdomen. Passage of more gas than usual.  Walking can help get rid of the air that was put into your GI tract during the procedure and reduce the bloating. If you had a lower endoscopy (such as a colonoscopy or flexible sigmoidoscopy) you may notice spotting of blood in your stool or on the toilet paper. If you underwent a bowel prep for your procedure, you may not have a normal bowel movement for a few days.  Please Note:  You might notice some irritation and congestion in your nose or some drainage.  This is from the oxygen used during your procedure.  There is no need for concern and it should clear up in a day or so.  SYMPTOMS TO REPORT IMMEDIATELY:   Following lower endoscopy (colonoscopy or flexible sigmoidoscopy):  Excessive amounts of blood in the stool  Significant tenderness or worsening of abdominal pains  Swelling of the abdomen that is new, acute  Fever of 100F or higher   For urgent or emergent issues, a gastroenterologist can be reached at any hour by calling [phone]. Do not use MyChart messaging for urgent concerns.    DIET:  We do recommend a small meal at first, but then you may proceed to your regular diet.  Drink plenty of fluids but you should avoid alcoholic beverages for 24 hours.  ACTIVITY:  You should  plan to take it easy for the rest of today and you should NOT DRIVE or use heavy machinery until tomorrow (because of the sedation medicines used during the test).    FOLLOW UP: Our staff will call the number listed on your records 48-72 hours following your procedure to check on you and address any questions or concerns that you may have regarding the information given to you following your procedure. If we do not reach you, we will leave a message.  We will attempt to reach you two times.  During this call, we will ask if you have developed any symptoms of COVID 19. If you develop any symptoms (ie: fever, flu-like symptoms, shortness of breath, cough etc.) before then, please call [phone].  If you test positive for Covid 19 in the 2 weeks post procedure, please call and report this information to [place].    If any biopsies were taken you will be contacted by phone or by letter within the next 1-3 weeks.  Please call us at [phone] if you have not heard about the biopsies in 3 weeks.    SIGNATURES/CONFIDENTIALITY: You and/or your care partner have signed paperwork which will be entered into your electronic medical record.  These signatures attest to the fact that that the information above on your After Visit Summary has been reviewed and is understood.  Full responsibility of the confidentiality of this discharge information lies with  you and/or your care-partner.

## 2019-11-23 NOTE — Progress Notes (Signed)
PT taken to PACU. Monitors in place. VSS. Report given to RN. 

## 2019-11-23 NOTE — Op Note (Signed)
[place] Patient Name: Tracey Patterson Procedure Date: 11/23/2019 2:59 PM MRN: [id number] Endoscopist: Jackquline Denmark , MD Age: 39 Referring MD:  Date of Birth: 04-22-1980 Gender: Female Account #: [id number] Procedure:                Colonoscopy Indications:              Colon cancer screening in patient at increased                            risk: Family history of colorectal cancer in 2nd                            degree relative. IBS-C. Prev abn CT showing sigmoid                            thickening. Medicines:                Monitored Anesthesia Care Procedure:                Pre-Anesthesia Assessment:                           - Prior to the procedure, a History and Physical                            was performed, and patient medications and                            allergies were reviewed. The patient's tolerance of                            previous anesthesia was also reviewed. The risks                            and benefits of the procedure and the sedation                            options and risks were discussed with the patient.                            All questions were answered, and informed consent                            was obtained. Prior Anticoagulants: The patient has                            taken no previous anticoagulant or antiplatelet                            agents. ASA Grade Assessment: II - A patient with                            mild systemic disease. After reviewing the risks  and benefits, the patient was deemed in                            satisfactory condition to undergo the procedure.                           After obtaining informed consent, the colonoscope                            was passed under direct vision. Throughout the                            procedure, the patient's blood pressure, pulse, and                            oxygen saturations were monitored continuously. The                             Colonoscope was introduced through the anus and                            advanced to the 2 cm into the ileum. The                            colonoscopy was performed without difficulty. The                            patient tolerated the procedure well. The quality                            of the bowel preparation was good. The terminal                            ileum, ileocecal valve, appendiceal orifice, and                            rectum were photographed. Scope In: 3:06:52 PM Scope Out: 3:18:47 PM Scope Withdrawal Time: 0 hours 7 minutes 27 seconds  Total Procedure Duration: 0 hours 11 minutes 55 seconds  Findings:                 A few small-mouthed diverticula were found in the                            sigmoid colon.                           Non-bleeding internal hemorrhoids were found during                            retroflexion. The hemorrhoids were small.                           The terminal ileum appeared normal.  The exam was otherwise without abnormality on                            direct and retroflexion views. The colon was                            somewhat redundant. Complications:            No immediate complications. Estimated Blood Loss:     Estimated blood loss: none. Impression:               -Mild sigmoid diverticulosis.                           -Otherwise normal colonoscopy to TI. Recommendation:           - Patient has a contact number available for                            emergencies. The signs and symptoms of potential                            delayed complications were discussed with the                            patient. Return to normal activities tomorrow.                            Written discharge instructions were provided to the                            patient.                           - High fiber diet.                           - Use Benefiber one teaspoon PO  daily.                           - Miralax 1 capful (17 grams) in 8 ounces of water                            PO daily.                           - Repeat colonoscopy in 10 years for screening                            purposes. Earlier, if with any new problems or if                            there is any change in family history.                           - Return to GI office PRN.                           -  D/W Gerald Stabs. Jackquline Denmark, MD 11/23/2019 3:25:44 PM This report has been signed electronically.

## 2019-11-27 ENCOUNTER — Telehealth: Payer: Self-pay

## 2019-11-27 ENCOUNTER — Telehealth: Payer: Self-pay | Admitting: *Deleted

## 2019-11-27 NOTE — Telephone Encounter (Signed)
Attempted f/u phone call. No answer. Left message. °

## 2019-11-27 NOTE — Telephone Encounter (Signed)
2nd follow up call made.  NALM 

## 2019-12-11 ENCOUNTER — Other Ambulatory Visit: Payer: Self-pay | Admitting: Osteopathic Medicine

## 2019-12-11 DIAGNOSIS — F411 Generalized anxiety disorder: Secondary | ICD-10-CM

## 2019-12-14 ENCOUNTER — Other Ambulatory Visit: Payer: Self-pay | Admitting: Osteopathic Medicine

## 2019-12-14 ENCOUNTER — Other Ambulatory Visit: Payer: Self-pay

## 2019-12-14 DIAGNOSIS — F411 Generalized anxiety disorder: Secondary | ICD-10-CM

## 2019-12-14 MED ORDER — BUPROPION HCL ER (XL) 300 MG PO TB24: 300.0000 mg | ORAL_TABLET | Freq: Every day | ORAL | 3 refills | Status: DC

## 2019-12-14 MED FILL — buPROPion HCL ER (XL) 300 M: 300 | 90 days supply | Qty: 90 | Fill #0

## 2019-12-14 NOTE — Telephone Encounter (Signed)
Last refill 09/09/2019 Last Ov- 07/04/2019

## 2019-12-23 ENCOUNTER — Telehealth: Payer: No Typology Code available for payment source | Admitting: Family

## 2019-12-23 DIAGNOSIS — J019 Acute sinusitis, unspecified: Secondary | ICD-10-CM | POA: Diagnosis not present

## 2019-12-23 DIAGNOSIS — J45909 Unspecified asthma, uncomplicated: Secondary | ICD-10-CM | POA: Diagnosis not present

## 2019-12-23 MED ORDER — PREDNISONE 10 MG (21) PO TBPK: ORAL_TABLET | ORAL | 0 refills | Status: DC

## 2019-12-23 MED ORDER — AMOXICILLIN-POT CLAVULANATE 875-125 MG PO TABS: 1.0000 | ORAL_TABLET | Freq: Two times a day (BID) | ORAL | 0 refills | Status: DC

## 2019-12-23 NOTE — Progress Notes (Signed)
We are sorry that you are not feeling well.  Here is how we plan to help!  Given your symptoms you do need to be tested for COVID to rule out.   Based on what you have shared with me it looks like you have sinusitis.  Sinusitis is inflammation and infection in the sinus cavities of the head.  Based on your presentation I believe you most likely have Acute Bacterial Sinusitis.  This is an infection caused by bacteria and is treated with antibiotics. I have prescribed Augmentin 876m/125mg one tablet twice daily with food, for 7 days. I have also sent in a prednisone dose pack for your asthma.  You may use an oral decongestant such as Mucinex D or if you have glaucoma or high blood pressure use plain Mucinex. Saline nasal spray help and can safely be used as often as needed for congestion.  If you develop worsening sinus pain, fever or notice severe headache and vision changes, or if symptoms are not better after completion of antibiotic, please schedule an appointment with a health care provider.    Sinus infections are not as easily transmitted as other respiratory infection, however we still recommend that you avoid close contact with loved ones, especially the very young and elderly.  Remember to wash your hands thoroughly throughout the day as this is the number one way to prevent the spread of infection!  Home Care:  Only take medications as instructed by your medical team.  Complete the entire course of an antibiotic.  Do not take these medications with alcohol.  A steam or ultrasonic humidifier can help congestion.  You can place a towel over your head and breathe in the steam from hot water coming from a faucet.  Avoid close contacts especially the very young and the elderly.  Cover your mouth when you cough or sneeze.  Always remember to wash your hands.  Get Help Right Away If:  You develop worsening fever or sinus pain.  You develop a severe head ache or visual changes.  Your  symptoms persist after you have completed your treatment plan.  Make sure you  Understand these instructions.  Will watch your condition.  Will get help right away if you are not doing well or get worse.  Your e-visit answers were reviewed by a board certified advanced clinical practitioner to complete your personal care plan.  Depending on the condition, your plan could have included both over the counter or prescription medications.  If there is a problem please reply  once you have received a response from your provider.  Your safety is important to [place]  If you have drug allergies check your prescription carefully.    You can use MyChart to ask questions about today's visit, request a non-urgent call back, or ask for a work or school excuse for 24 hours related to this e-Visit. If it has been greater than 24 hours you will need to follow up with your provider, or enter a new e-Visit to address those concerns.  You will get an e-mail in the next two days asking about your experience.  I hope that your e-visit has been valuable and will speed your recovery. Thank you for using e-visits.  Approximately 5 minutes was spent documenting and reviewing patient's chart.

## 2019-12-31 MED FILL — buPROPion HCL ER (XL) 300 M: 300 | 90 days supply | Qty: 90 | Fill #0

## 2020-01-23 ENCOUNTER — Telehealth: Payer: No Typology Code available for payment source | Admitting: Nurse Practitioner

## 2020-01-23 DIAGNOSIS — B9789 Other viral agents as the cause of diseases classified elsewhere: Secondary | ICD-10-CM

## 2020-01-23 DIAGNOSIS — J329 Chronic sinusitis, unspecified: Secondary | ICD-10-CM | POA: Diagnosis not present

## 2020-01-23 MED ORDER — FLUTICASONE PROPIONATE 50 MCG/ACT NA SUSP: 2.0000 | Freq: Every day | NASAL | 6 refills | Status: DC

## 2020-01-23 MED FILL — FLUTICASONE PROP 50 MCG SPR: 50 | 30 days supply | Qty: 16 | Fill #0

## 2020-01-23 NOTE — Progress Notes (Signed)
We are sorry that you are not feeling well.  Here is how we plan to help!  Based on what you have shared with me it looks like you have sinusitis.  Sinusitis is inflammation and infection in the sinus cavities of the head.  Based on your presentation I believe you most likely have Acute Viral Sinusitis.This is an infection most likely caused by a virus. There is not specific treatment for viral sinusitis other than to help you with the symptoms until the infection runs its course.  You may use an oral decongestant such as Mucinex D or if you have glaucoma or high blood pressure use plain Mucinex. Saline nasal spray help and can safely be used as often as needed for congestion, I have prescribed: Fluticasone nasal spray two sprays in each nostril once a day   Providers prescribe antibiotics to treat infections caused by bacteria. Antibiotics are very powerful in treating bacterial infections when they are used properly. To maintain their effectiveness, they should be used only when necessary. Overuse of antibiotics has resulted in the development of superbugs that are resistant to treatment!    After careful review of your answers, I would not recommend an antibiotic for your condition.  Antibiotics are not effective against viruses and therefore should not be used to treat them. Common examples of infections caused by viruses include colds and flu    Some authorities believe that zinc sprays or the use of Echinacea may shorten the course of your symptoms.  Sinus infections are not as easily transmitted as other respiratory infection, however we still recommend that you avoid close contact with loved ones, especially the very young and elderly.  Remember to wash your hands thoroughly throughout the day as this is the number one way to prevent the spread of infection!  Home Care:  Only take medications as instructed by your medical team.  Do not take these medications with alcohol.  A steam or  ultrasonic humidifier can help congestion.  You can place a towel over your head and breathe in the steam from hot water coming from a faucet.  Avoid close contacts especially the very young and the elderly.  Cover your mouth when you cough or sneeze.  Always remember to wash your hands.  Get Help Right Away If:  You develop worsening fever or sinus pain.  You develop a severe head ache or visual changes.  Your symptoms persist after you have completed your treatment plan.  Make sure you  Understand these instructions.  Will watch your condition.  Will get help right away if you are not doing well or get worse.  Your e-visit answers were reviewed by a board certified advanced clinical practitioner to complete your personal care plan.  Depending on the condition, your plan could have included both over the counter or prescription medications.  If there is a problem please reply  once you have received a response from your provider.  Your safety is important to [place].  If you have drug allergies check your prescription carefully.    You can use MyChart to ask questions about today's visit, request a non-urgent call back, or ask for a work or school excuse for 24 hours related to this e-Visit. If it has been greater than 24 hours you will need to follow up with your provider, or enter a new e-Visit to address those concerns.  You will get an e-mail in the next two days asking about your experience.  I  hope that your e-visit has been valuable and will speed your recovery. Thank you for using e-visits.   5-10 minutes spent reviewing and documenting in chart.

## 2020-01-27 ENCOUNTER — Encounter: Payer: Self-pay | Admitting: Physician Assistant

## 2020-01-27 ENCOUNTER — Telehealth: Payer: No Typology Code available for payment source | Admitting: Physician Assistant

## 2020-01-27 ENCOUNTER — Encounter: Payer: Self-pay | Admitting: Osteopathic Medicine

## 2020-01-27 DIAGNOSIS — Z20822 Contact with and (suspected) exposure to covid-19: Secondary | ICD-10-CM

## 2020-01-27 DIAGNOSIS — J45901 Unspecified asthma with (acute) exacerbation: Secondary | ICD-10-CM | POA: Diagnosis not present

## 2020-01-27 DIAGNOSIS — J029 Acute pharyngitis, unspecified: Secondary | ICD-10-CM

## 2020-01-27 MED ORDER — BENZONATATE 100 MG PO CAPS: 200.0000 mg | ORAL_CAPSULE | Freq: Three times a day (TID) | ORAL | 0 refills | Status: DC | PRN

## 2020-01-27 MED ORDER — PREDNISONE 20 MG PO TABS: 40.0000 mg | ORAL_TABLET | Freq: Every day | ORAL | 0 refills | Status: DC

## 2020-01-27 MED ORDER — PROMETHAZINE-DM 6.25-15 MG/5ML PO SYRP: 5.0000 mL | ORAL_SOLUTION | Freq: Every evening | ORAL | 0 refills | Status: DC | PRN

## 2020-01-27 MED ORDER — AMOXICILLIN 500 MG PO CAPS: 500.0000 mg | ORAL_CAPSULE | Freq: Two times a day (BID) | ORAL | 0 refills | Status: DC

## 2020-01-27 NOTE — Progress Notes (Signed)
E-Visit for Corona Virus Screening  Your current symptoms could be consistent with the coronavirus.  Many health care providers can now test patients at their office but not all are.   has multiple testing sites. For information on our [place] testing locations and hours go to [email or web address]  We are enrolling you in our [place] for [place] . Daily you will receive a questionnaire within the [place] website. Our COVID 19 response team will be monitoring your responses daily.  Testing Information: The COVID-19 Community Testing sites are testing BY APPOINTMENT ONLY.  You can schedule online at [email or web address]  If you do not have access to a smart phone or computer you may call [phone] for an appointment.   [name],  I have treated your for an Asthma flare with steroids, daytime cough Perles, Tessalon Perles, and nigh time cough syrup, Promethazine DM. Your sore throat is likely viral or secondary to the cough, however due to severity and persistence of symptoms, I have prescribed Amoxicillin 500 mg twice daily for 10 days. If your symptoms are not better with these medications, please consider a face to face evaluation with your doctor.  Additional testing sites in the Community:  . For [facility] Testing sites in [facility]  FaceUpdate.[email or web address]  . For Pop-up testing sites in [place]  BowlDirectory.co.uk  . For [facility] [email or web address]  . For [facility] testing in [place] and [employer] [email or web address]  . For [facility] testing in [facility]   [email or web address]  For  more information about  community testing call [phone]   Please quarantine yourself while awaiting your test results. Please stay home for a minimum of 10 days from the first day of illness with improving symptoms and you have had 24 hours of no fever (without the use of Tylenol (Acetaminophen) Motrin (Ibuprofen) or any fever reducing medication).  Also - Do not get tested prior to returning to work because once you have had a positive test the test can stay positive for more than a month in some cases.   You should wear a mask or cloth face covering over your nose and mouth if you must be around other people or animals, including pets (even at home). Try to stay at least 6 feet away from other people. This will protect the people around you.  Please continue good preventive care measures, including:  frequent hand-washing, avoid touching your face, cover coughs/sneezes, stay out of crowds and keep a 6 foot distance from others.  COVID-19 is a respiratory illness with symptoms that are similar to the flu. Symptoms are typically mild to moderate, but there have been cases of severe illness and death due to the virus.   The following symptoms may appear 2-14 days after exposure: . Fever . Cough . Shortness of breath or difficulty breathing . Chills . Repeated shaking with chills . Muscle pain . Headache . Sore throat . New loss of taste or smell . Fatigue . Congestion or runny nose . Nausea or vomiting . Diarrhea  Go to the nearest hospital ED for assessment if fever/cough/breathlessness are severe or illness seems like a threat to life.  It is vitally important that if you feel that you have an infection such as this virus or any other virus that you stay home and away from places where you may spread it to others.  You should avoid contact with people age 63 and older.   You can use medication  such as A prescription cough medication called Tessalon Perles 100 mg. You may take 1-2 capsules every 8 hours as  needed for cough  You may also take acetaminophen (Tylenol) as needed for fever.  Reduce your risk of any infection by using the same precautions used for avoiding the common cold or flu:  Marland Kitchen Wash your hands often with soap and warm water for at least 20 seconds.  If soap and water are not readily available, use an alcohol-based hand sanitizer with at least 60% alcohol.  . If coughing or sneezing, cover your mouth and nose by coughing or sneezing into the elbow areas of your shirt or coat, into a tissue or into your sleeve (not your hands). . Avoid shaking hands with others and consider head nods or verbal greetings only. . Avoid touching your eyes, nose, or mouth with unwashed hands.  . Avoid close contact with people who are sick. . Avoid places or events with large numbers of people in one location, like concerts or sporting events. . Carefully consider travel plans you have or are making. . If you are planning any travel outside or inside the [place], visit the CDC's Travelers' Health webpage for the latest health notices. . If you have some symptoms but not all symptoms, continue to monitor at home and seek medical attention if your symptoms worsen. . If you are having a medical emergency, call 911.  HOME CARE . Only take medications as instructed by your medical team. . Drink plenty of fluids and get plenty of rest. . A steam or ultrasonic humidifier can help if you have congestion.   GET HELP RIGHT AWAY IF YOU HAVE EMERGENCY WARNING SIGNS** FOR COVID-19. If you or someone is showing any of these signs seek emergency medical care immediately. Call 911 or proceed to your closest emergency facility if: . You develop worsening high fever. . Trouble breathing . Bluish lips or face . Persistent pain or pressure in the chest . New confusion . Inability to wake or stay awake . You cough up blood. . Your symptoms become more severe  **This list is not all possible symptoms. Contact your  medical provider for any symptoms that are sever or concerning to you.  MAKE SURE YOU   Understand these instructions.  Will watch your condition.  Will get help right away if you are not doing well or get worse.  Your e-visit answers were reviewed by a board certified advanced clinical practitioner to complete your personal care plan.  Depending on the condition, your plan could have included both over the counter or prescription medications.  If there is a problem please reply once you have received a response from your provider.  Your safety is important to [place].  If you have drug allergies check your prescription carefully.    You can use MyChart to ask questions about today's visit, request a non-urgent call back, or ask for a work or school excuse for 24 hours related to this e-Visit. If it has been greater than 24 hours you will need to follow up with your provider, or enter a new e-Visit to address those concerns. You will get an e-mail in the next two days asking about your experience.  I hope that your e-visit has been valuable and will speed your recovery. Thank you for using e-visits.   I spent 5-10 minutes on review and completion of this note- Tracey Patterson [facility]

## 2020-01-28 ENCOUNTER — Encounter (INDEPENDENT_AMBULATORY_CARE_PROVIDER_SITE_OTHER): Payer: Self-pay

## 2020-02-06 ENCOUNTER — Encounter: Payer: Self-pay | Admitting: Osteopathic Medicine

## 2020-02-06 NOTE — Telephone Encounter (Signed)
Patient had a visit with provider not associated with our practice.  Please schedule her for virtual visit to talk more about her issues, or can refer to urgent care if needed.  Patient has been vaccinated for Covid, if she wants to come in to be seen in the office I think that would be okay with me if she comes later in the day to see me tomorrow, or if one of the other providers that is okay to see her, would leave it up to them if she needs to come in today and if there is availability.

## 2020-02-07 ENCOUNTER — Encounter: Payer: Self-pay | Admitting: Family Medicine

## 2020-02-07 ENCOUNTER — Other Ambulatory Visit: Payer: Self-pay | Admitting: Family Medicine

## 2020-02-07 ENCOUNTER — Telehealth (INDEPENDENT_AMBULATORY_CARE_PROVIDER_SITE_OTHER): Payer: No Typology Code available for payment source | Admitting: Family Medicine

## 2020-02-07 DIAGNOSIS — J4541 Moderate persistent asthma with (acute) exacerbation: Secondary | ICD-10-CM | POA: Diagnosis not present

## 2020-02-07 DIAGNOSIS — J45901 Unspecified asthma with (acute) exacerbation: Secondary | ICD-10-CM | POA: Insufficient documentation

## 2020-02-07 MED ORDER — HYDROCODONE-HOMATROPINE 5-1.5 MG/5ML PO SYRP
5.0000 mL | ORAL_SOLUTION | Freq: Three times a day (TID) | ORAL | 0 refills | Status: DC | PRN
Start: 2020-02-07 — End: 2020-02-07

## 2020-02-07 MED ORDER — DOXYCYCLINE HYCLATE 100 MG PO TABS: 100.0000 mg | ORAL_TABLET | Freq: Two times a day (BID) | ORAL | 0 refills | Status: DC

## 2020-02-07 MED ORDER — PREDNISONE 20 MG PO TABS: 20.0000 mg | ORAL_TABLET | Freq: Two times a day (BID) | ORAL | 0 refills | Status: DC

## 2020-02-07 MED FILL — predniSONE 20 MG TABS: 20 | 5 days supply | Qty: 10 | Fill #0

## 2020-02-07 MED FILL — DOXYCYCLINE HYCLATE 100 MG: 100 | 10 days supply | Qty: 20 | Fill #0

## 2020-02-07 MED FILL — HYCODAN 5-1.5 MG/5ML SYRP: 5-1.5 | 8 days supply | Qty: 120 | Fill #0

## 2020-02-07 NOTE — Progress Notes (Signed)
Tracey Patterson - 39 y.o. female MRN [id number]  Date of birth: 23-Aug-1980   This visit type was conducted due to national recommendations for restrictions regarding the COVID-19 Pandemic (e.g. social distancing).  This format is felt to be most appropriate for this patient at this time.  All issues noted in this document were discussed and addressed.  No physical exam was performed (except for noted visual exam findings with Video Visits).  I discussed the limitations of evaluation and management by telemedicine and the availability of in person appointments. The patient expressed understanding and agreed to proceed.  I connected with@ on 02/07/20 at  1:00 PM EST by a video enabled telemedicine application and verified that I am speaking with the correct person using two identifiers.  Present at visit: Luetta Nutting, DO [place]   Patient Location: [facility] [address] [place] [address]   Provider location:   [place]  No chief complaint on file.   HPI  Jaylen Claude is a 39 y.o. female who presents via Engineer, civil (consulting) for a telehealth visit today.  She is following up today for cough, wheezing, mild shortness of breath, drainage and fatigue.  She had E-visit and was started on amoxicillin, steroids and promethazine-dm.  She had some improvement with steroid and antibiotic seemed to help some but symptoms returned after completion of these.  She is using albuterol more often.  She has had COVID vaccine and tested negative for COVID through health at work.    ROS:  A comprehensive ROS was completed and negative except as noted per HPI  Past Medical History:  Diagnosis Date  . Anxiety   . Arthritis   . Asthma   . Chronic headache   . Depression   . Fibromyalgia   . GERD (gastroesophageal reflux disease)   . History of TMJ disorder   . IBS (irritable bowel syndrome)   . Obesity   . Pneumonia     Past Surgical History:  Procedure Laterality Date  . BACK  SURGERY  December 2015  . KNEE ARTHROSCOPY  1998   left  . LAPAROSCOPIC TUBAL LIGATION  02/28/2012   Procedure: LAPAROSCOPIC TUBAL LIGATION;  Surgeon: Cyril Mourning, MD;  Location: [facility];  Service: Gynecology;  Laterality: Bilateral;  . TONSILLECTOMY      Family History  Problem Relation Age of Onset  . Hypertension Mother   . Depression Mother   . Hypertension Father   . Breast cancer Maternal Aunt   . Prostate cancer Maternal Uncle   . Prostate cancer Maternal Uncle   . Colon cancer Maternal Uncle   . Esophageal cancer Neg Hx     Social History   Socioeconomic History  . Marital status: Married    Spouse name: Not on file  . Number of children: 3  . Years of education: Not on file  . Highest education level: Not on file  Occupational History  . Not on file  Tobacco Use  . Smoking status: Never Smoker  . Smokeless tobacco: Never Used  Vaping Use  . Vaping Use: Never used  Substance and Sexual Activity  . Alcohol use: Yes  . Drug use: No  . Sexual activity: Not Currently  Other Topics Concern  . Not on file  Social History Narrative   Marital Status: Married Aeronautical engineer)    Children:  Son Cristie Hem) Daughter [name])    Pets: Dogs (1) Cats (2)    Living Situation: Lives with  husband and children  Occupation: Radiology Tech - [employer]    Education: [employer] Degree in Science    Tobacco Use/Exposure:  None    Alcohol Use:  Occasional   Drug Use:  None   Diet:  Regular   Exercise:  None   Hobbies:  Reading          Social Determinants of Health   Financial Resource Strain:   . Difficulty of Paying Living Expenses: Not on file  Food Insecurity:   . Worried About Charity fundraiser in the Last Year: Not on file  . Ran Out of Food in the Last Year: Not on file  Transportation Needs:   . Lack of Transportation (Medical): Not on file  . Lack of Transportation (Non-Medical): Not on file  Physical Activity:   . Days of Exercise per  Week: Not on file  . Minutes of Exercise per Session: Not on file  Stress:   . Feeling of Stress : Not on file  Social Connections:   . Frequency of Communication with Friends and Family: Not on file  . Frequency of Social Gatherings with Friends and Family: Not on file  . Attends Religious Services: Not on file  . Active Member of Clubs or Organizations: Not on file  . Attends Archivist Meetings: Not on file  . Marital Status: Not on file  Intimate Partner Violence:   . Fear of Current or Ex-Partner: Not on file  . Emotionally Abused: Not on file  . Physically Abused: Not on file  . Sexually Abused: Not on file     Current Outpatient Medications:  .  Albuterol Sulfate (PROAIR RESPICLICK) 283 (90 Base) MCG/ACT AEPB, Inhale 1-2 puffs into the lungs every 4 (four) hours as needed (cough/wheeze)., Disp: 1 each, Rfl: 1 .  amitriptyline (ELAVIL) 50 MG tablet, Take 1-2 tablets (50-100 mg total) by mouth at bedtime as needed for sleep (pain). (Patient not taking: Reported on 10/10/2019), Disp: 180 tablet, Rfl: 2 .  baclofen (LIORESAL) 10 MG tablet, Take 1-2 tablets (10-20 mg total) by mouth 3 (three) times daily as needed for muscle spasms., Disp: 90 each, Rfl: 1 .  benzonatate (TESSALON) 100 MG capsule, Take 2 capsules (200 mg total) by mouth 3 (three) times daily as needed for cough., Disp: 20 capsule, Rfl: 0 .  buPROPion (WELLBUTRIN XL) 300 MG 24 hr tablet, Take 1 tablet (300 mg total) by mouth daily., Disp: 90 tablet, Rfl: 3 .  cetirizine (ZYRTEC) 10 MG tablet, Take 1 tablet (10 mg total) by mouth daily., Disp: 30 tablet, Rfl: 3 .  clonazePAM (KLONOPIN) 0.5 MG tablet, Take 0.5-1 tablets (0.25-0.5 mg total) by mouth 2 (two) times daily as needed for anxiety., Disp: 30 tablet, Rfl: 0 .  diclofenac sodium (VOLTAREN) 1 % GEL, Apply 4 g topically 4 (four) times daily. To affected joint., Disp: 100 g, Rfl: 11 .  doxycycline (VIBRA-TABS) 100 MG tablet, Take 1 tablet (100 mg total) by  mouth 2 (two) times daily., Disp: 20 tablet, Rfl: 0 .  fluticasone (FLONASE) 50 MCG/ACT nasal spray, Place 2 sprays into both nostrils daily., Disp: 16 g, Rfl: 6 .  gabapentin (NEURONTIN) 300 MG capsule, Take 1 capsule (300 mg total) by mouth 3 (three) times daily as needed (nerve pain)., Disp: 90 capsule, Rfl: 3 .  HYDROcodone-homatropine (HYCODAN) 5-1.5 MG/5ML syrup, Take 5 mLs by mouth every 8 (eight) hours as needed for cough., Disp: 120 mL, Rfl: 0 .  IBUPROFEN PO, Take 800  mg by mouth as needed. , Disp: , Rfl:  .  linaclotide (LINZESS) 145 MCG CAPS capsule, Take 1 capsule (145 mcg total) by mouth daily., Disp: 90 capsule, Rfl: 1 .  pantoprazole (PROTONIX) 40 MG tablet, TAKE 1 TABLET (40 MG TOTAL) BY MOUTH 2 (TWO) TIMES DAILY., Disp: 180 tablet, Rfl: 0 .  predniSONE (DELTASONE) 20 MG tablet, Take 1 tablet (20 mg total) by mouth 2 (two) times daily with a meal for 5 days., Disp: 10 tablet, Rfl: 0  EXAM:  VITALS per patient if applicable: There were no vitals taken for this visit.  GENERAL: alert, appears ill but non-toxic.   HEENT: atraumatic, conjunttiva clear, no obvious abnormalities on inspection of external nose and ears  NECK: normal movements of the head and neck  LUNGS: on inspection no signs of respiratory distress, breathing rate appears normal, no obvious gross SOB, gasping or wheezing.  She does have a harsh cough  CV: no obvious cyanosis  MS: moves all visible extremities without noticeable abnormality  PSYCH/NEURO: pleasant and cooperative, no obvious depression or anxiety, speech and thought processing grossly intact  ASSESSMENT AND PLAN:  Discussed the following assessment and plan:  Asthma exacerbation Tried amoxicillin previously with temporary improvement but symptoms returned.  Start doxycycline Restart prednisone as well as she found this very helpful.  Rx for hycodan syrup. Continue supportive care with increased fluids and rest.  Follow up in clinic if  symptoms are not resolving with above     I discussed the assessment and treatment plan with the patient. The patient was provided an opportunity to ask questions and all were answered. The patient agreed with the plan and demonstrated an understanding of the instructions.   The patient was advised to call back or seek an in-person evaluation if the symptoms worsen or if the condition fails to improve as anticipated.    Luetta Nutting, DO

## 2020-02-07 NOTE — Assessment & Plan Note (Addendum)
Tried amoxicillin previously with temporary improvement but symptoms returned.  Start doxycycline Restart prednisone as well as she found this very helpful.  Rx for hycodan syrup. Continue supportive care with increased fluids and rest.  Follow up in clinic if symptoms are not resolving with above

## 2020-02-11 ENCOUNTER — Encounter: Payer: Self-pay | Admitting: Family Medicine

## 2020-02-13 ENCOUNTER — Other Ambulatory Visit: Payer: Self-pay

## 2020-02-13 ENCOUNTER — Telehealth: Payer: Self-pay

## 2020-02-13 ENCOUNTER — Ambulatory Visit (HOSPITAL_COMMUNITY)
Admission: RE | Admit: 2020-02-13 | Discharge: 2020-02-13 | Disposition: A | Discharge status: Home / Self Care | Payer: No Typology Code available for payment source | Source: Ambulatory Visit | Attending: Family Medicine | Admitting: Family Medicine

## 2020-02-13 ENCOUNTER — Other Ambulatory Visit: Payer: Self-pay | Admitting: Family Medicine

## 2020-02-13 DIAGNOSIS — R0600 Dyspnea, unspecified: Secondary | ICD-10-CM

## 2020-02-13 DIAGNOSIS — R059 Cough, unspecified: Secondary | ICD-10-CM | POA: Diagnosis present

## 2020-02-13 NOTE — Telephone Encounter (Signed)
Pt lvm stating SOB of breath. Requesting chest x-ray. 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

## 2020-02-13 NOTE — Telephone Encounter (Signed)
X-ray ordered.

## 2020-02-14 ENCOUNTER — Other Ambulatory Visit: Payer: Self-pay | Admitting: Family Medicine

## 2020-02-14 MED ORDER — PREDNISONE 20 MG PO TABS: 20.0000 mg | ORAL_TABLET | Freq: Two times a day (BID) | ORAL | 0 refills | Status: DC

## 2020-02-14 MED FILL — predniSONE 20 MG TABS: 20 | 5 days supply | Qty: 10 | Fill #0

## 2020-03-31 MED FILL — buPROPion HCL ER (XL) 300 M: 300 | 90 days supply | Qty: 90 | Fill #1

## 2020-04-02 ENCOUNTER — Other Ambulatory Visit: Payer: Self-pay | Admitting: Family Medicine

## 2020-04-02 ENCOUNTER — Telehealth: Payer: Self-pay | Admitting: Osteopathic Medicine

## 2020-04-02 DIAGNOSIS — K219 Gastro-esophageal reflux disease without esophagitis: Secondary | ICD-10-CM

## 2020-04-02 MED ORDER — PANTOPRAZOLE SODIUM 40 MG PO TBEC: 40.0000 mg | DELAYED_RELEASE_TABLET | Freq: Two times a day (BID) | ORAL | 0 refills | Status: DC

## 2020-04-02 MED ORDER — AMITRIPTYLINE HCL 50 MG PO TABS
50.0000 mg | ORAL_TABLET | Freq: Every evening | ORAL | 0 refills | Status: DC | PRN
Start: 2020-04-02 — End: 2020-04-22

## 2020-04-02 MED FILL — PANTOPRAZOLE SOD DR 40 MG T: 40 | 90 days supply | Qty: 180 | Fill #0

## 2020-04-02 MED FILL — AMITRIPTYLINE HCL 50 MG TAB: 50 | 90 days supply | Qty: 180 | Fill #0

## 2020-04-02 NOTE — Telephone Encounter (Signed)
This is fine with me   

## 2020-04-02 NOTE — Telephone Encounter (Addendum)
Pt called. She wants to switch from Dr. Sheppard Coil to Tracey Patterson. Thank you. Left pt vm to call us back to let us know her reason for wanting to switch to another provider.

## 2020-04-02 NOTE — Telephone Encounter (Signed)
Rx sent in

## 2020-04-02 NOTE — Telephone Encounter (Signed)
LM on voicemail @ 4:42pm that her refills have been sent to the pharmacy.

## 2020-04-02 NOTE — Telephone Encounter (Signed)
Pt called. She needs refill on Protonix &  Amitriptyline.

## 2020-04-02 NOTE — Telephone Encounter (Signed)
Pt called back. She states Caryl Asp is her daughter's PCP and she was really good with her daughter and feels she will be able to communicate with Joy.

## 2020-04-07 NOTE — Telephone Encounter (Signed)
Fine w/ me too

## 2020-04-14 ENCOUNTER — Ambulatory Visit: Payer: Self-pay

## 2020-04-16 NOTE — Telephone Encounter (Signed)
Thank you ladies.

## 2020-04-22 ENCOUNTER — Encounter: Payer: Self-pay | Admitting: Medical-Surgical

## 2020-04-22 ENCOUNTER — Other Ambulatory Visit: Payer: Self-pay | Admitting: Medical-Surgical

## 2020-04-22 ENCOUNTER — Ambulatory Visit (INDEPENDENT_AMBULATORY_CARE_PROVIDER_SITE_OTHER): Payer: No Typology Code available for payment source | Admitting: Medical-Surgical

## 2020-04-22 ENCOUNTER — Other Ambulatory Visit: Payer: Self-pay

## 2020-04-22 DIAGNOSIS — F411 Generalized anxiety disorder: Secondary | ICD-10-CM | POA: Diagnosis not present

## 2020-04-22 DIAGNOSIS — K219 Gastro-esophageal reflux disease without esophagitis: Secondary | ICD-10-CM

## 2020-04-22 MED ORDER — PANTOPRAZOLE SODIUM 40 MG PO TBEC: 40.0000 mg | DELAYED_RELEASE_TABLET | Freq: Two times a day (BID) | ORAL | 0 refills | Status: DC

## 2020-04-22 MED ORDER — BUPROPION HCL ER (XL) 300 MG PO TB24: 300.0000 mg | ORAL_TABLET | Freq: Every day | ORAL | 3 refills | Status: DC

## 2020-04-22 MED ORDER — AMITRIPTYLINE HCL 75 MG PO TABS: 75.0000 mg | ORAL_TABLET | Freq: Every evening | ORAL | 1 refills | Status: DC | PRN

## 2020-04-22 MED FILL — AMITRIPTYLINE HCL 75 MG TAB: 75 | 30 days supply | Qty: 30 | Fill #0

## 2020-04-22 NOTE — Progress Notes (Signed)
Subjective:    CC: mood/GERD follow up  HPI: Pleasant 40 year old female presenting for the following:  Mood- Taking Amitriptyline 129m nightly, tolerating well but wakes every morning feeling tired. Also taking Wellbutrin 3082mdaily. Notes that she has very little motivation to do anything. Is able to perform her job and associated duties but when home, has no desire to get up and get anything done. This is very bothersome. She reports that she tried stopping the Amitriptyline a while back but her anxiety levels got very high and she restarted the medication. This was about 8 months ago. Denies SI/HI. Is concerned that the Amitriptyline may be causing her IBS-C to worsen due to it's antispasmodic quality.   GERD- Protonix helps with her reflux symptoms. Takes it daily on most days but about once every three months has to increase it to twice daily for a few days due to worsening symptoms.   I reviewed the past medical history, family history, social history, surgical history, and allergies today and no changes were needed.  Please see the problem list section below in epic for further details.  Past Medical History: Past Medical History:  Diagnosis Date  . Anxiety   . Arthritis   . Asthma   . Chronic headache   . Depression   . Fibromyalgia   . GERD (gastroesophageal reflux disease)   . History of TMJ disorder   . IBS (irritable bowel syndrome)   . Obesity   . Pneumonia    Past Surgical History: Past Surgical History:  Procedure Laterality Date  . BACK SURGERY  December 2015  . KNEE ARTHROSCOPY  1998   left  . LAPAROSCOPIC TUBAL LIGATION  02/28/2012   Procedure: LAPAROSCOPIC TUBAL LIGATION;  Surgeon: MiCyril MourningMD;  [facility];  Service: Gynecology;  Laterality: Bilateral;  . TONSILLECTOMY     Social History: Social History   Socioeconomic History  . Marital status: Married    Spouse name: Not on file  . Number of children: 3  . Years of education: Not  on file  . Highest education level: Not on file  Occupational History  . Not on file  Tobacco Use  . Smoking status: Never Smoker  . Smokeless tobacco: Never Used  Vaping Use  . Vaping Use: Never used  Substance and Sexual Activity  . Alcohol use: Yes  . Drug use: No  . Sexual activity: Not Currently  Other Topics Concern  . Not on file  Social History Narrative   Marital Status: Married (CAeronautical engineer   Children:  Son (AAir cabin crewDaughter (EEllamae Sia   Pets: Dogs (1) Cats (2)    Living Situation: Lives with  husband and children    Occupation: Radiology [employer]  Education: Bachelor's Degree in Science    Tobacco Use/Exposure:  None    Alcohol Use:  Occasional   Drug Use:  None   Diet:  Regular   Exercise:  None   Hobbies:  Reading          Social Determinants of Health   Financial Resource Strain: Not on file  Food Insecurity: Not on file  Transportation Needs: Not on file  Physical Activity: Not on file  Stress: Not on file  Social Connections: Not on file   Family History: Family History  Problem Relation Age of Onset  . Hypertension Mother   . Depression Mother   . Hypertension Father   . Breast cancer Maternal Aunt   .  Prostate cancer Maternal Uncle   . Prostate cancer Maternal Uncle   . Colon cancer Maternal Uncle   . Esophageal cancer Neg Hx    Allergies: Allergies  Allergen Reactions  . Sulfa Antibiotics Hives  . Zoloft [Sertraline Hcl] Other (See Comments)    Hair loss  . Crab [Shellfish Allergy] Other (See Comments)    Allergist advises against eating crab.  . Other Rash    Dermabond, steri strips   Medications: See med rec.  Review of Systems: See HPI for pertinent positives and negatives.   Depression screen [facility] 2/9 04/22/2020 09/01/2017 12/02/2016 03/24/2016 02/17/2016  Decreased Interest 2 1 0 1 1  Down, Depressed, Hopeless 1 1 1 1 1   PHQ - 2 Score 3 2 1 2 2   Altered sleeping 0 0 0 2 2  Tired, decreased energy 3 3 0  2 3  Change in appetite 2 1 0 3 1  Feeling bad or failure about yourself  1 0 0 2 1  Trouble concentrating - 2 0 1 2  Moving slowly or fidgety/restless 0 1 0 1 0  Suicidal thoughts 0 0 0 0 0  PHQ-9 Score 9 9 1 13 11   Difficult doing work/chores Extremely dIfficult Somewhat difficult - - -  Some encounter information is confidential and restricted. Go to Review Flowsheets activity to see all data.   GAD 7 : Generalized Anxiety Score 04/22/2020 09/01/2017 03/24/2016 02/17/2016  Nervous, Anxious, on Edge 3 2 2 3   Control/stop worrying 3 2 2 3   Worry too much - different things 3 2 2 3   Trouble relaxing 2 2 2 3   Restless 0 1 2 2   Easily annoyed or irritable 2 2 1 3   Afraid - awful might happen 0 0 1 0  Total GAD 7 Score 13 11 12 17   Anxiety Difficulty Very difficult Somewhat difficult Somewhat difficult Very difficult     Objective:    General: Well Developed, well nourished, and in no acute distress.  Neuro: Alert and oriented x3.  HEENT: Normocephalic, atraumatic.  Skin: Warm and dry. Cardiac: Regular rate and rhythm, no murmurs rubs or gallops, no lower extremity edema.  Respiratory: Clear to auscultation bilaterally. Not using accessory muscles, speaking in full sentences.  Impression and Recommendations:    1. Generalized anxiety disorder Continue Wellbutrin at 338m daily. Consider possible overtreatment with Amitriptyline at 1073mnightly given her fatigue and lethargy on waking/during the day. Decrease Amitriptyline to 7536mightly and monitor anxiety symptoms. If this helps, continue new dose. If anxiety symptoms worsen, return for discussion with PCP. Plan to follow up in 3-4 weeks to evaluate change in dose.  - buPROPion (WELLBUTRIN XL) 300 MG 24 hr tablet; Take 1 tablet (300 mg total) by mouth daily.  Dispense: 90 tablet; Refill: 3  2. Gastroesophageal reflux disease Continue Protonix 20m32mily, okay to increase to twice daily intermittently for a few days to manage  symptoms during flares.  - pantoprazole (PROTONIX) 40 MG tablet; Take 1 tablet (40 mg total) by mouth 2 (two) times daily.  Dispense: 180 tablet; Refill: 0  Return for mood follow up in 3-4 weeks with PCP. ___________________________________________ Joy Clearnce SorrelP, APRN, FNP-BC [facility]

## 2020-05-07 ENCOUNTER — Encounter: Payer: Self-pay | Admitting: Medical-Surgical

## 2020-05-12 ENCOUNTER — Encounter: Payer: Self-pay | Admitting: Medical-Surgical

## 2020-05-12 ENCOUNTER — Other Ambulatory Visit: Payer: Self-pay | Admitting: Medical-Surgical

## 2020-05-12 ENCOUNTER — Telehealth (INDEPENDENT_AMBULATORY_CARE_PROVIDER_SITE_OTHER): Payer: No Typology Code available for payment source | Admitting: Medical-Surgical

## 2020-05-12 DIAGNOSIS — F339 Major depressive disorder, recurrent, unspecified: Secondary | ICD-10-CM

## 2020-05-12 DIAGNOSIS — F411 Generalized anxiety disorder: Secondary | ICD-10-CM | POA: Diagnosis not present

## 2020-05-12 MED ORDER — DULOXETINE HCL 30 MG PO CPEP: 30.0000 mg | ORAL_CAPSULE | Freq: Every day | ORAL | 0 refills | Status: DC

## 2020-05-12 MED FILL — DULoxetine HCL 30 MG CPEP: 30 | 90 days supply | Qty: 90 | Fill #0

## 2020-05-12 NOTE — Progress Notes (Signed)
Virtual Visit via Video Note  I connected with Tracey Patterson on 05/12/20 at  9:40 AM EST by a video enabled telemedicine application and verified that I am speaking with the correct person using two identifiers.   I discussed the limitations of evaluation and management by telemedicine and the availability of in person appointments. The patient expressed understanding and agreed to proceed.  Patient location: home Provider locations: office  Subjective:    CC: anxiety/depression  HPI: Pleasant 40 year old female presenting via [place] video visit to discuss her current anxiety/depression medication and potential other options.  Started amitriptyline approximately 2 years ago and increased to 100 mg daily.  Unfortunately she was not tolerating this and felt constantly fatigued.  Considered possible overtreatment so reduce the dose to 75 mg nightly.  Notes that for the first few days she felt slightly offkilter and then it went downhill from there.  She would like to evaluate other options at this time.  Notes that her anxiety and depressive symptoms are pretty significant.  She is taking Wellbutrin 300 mg daily, tolerating well without side effects.  Denies SI/HI.  Reports that she originally started amitriptyline in order to treat some GI issues that she had.  These have since resolved and she is now trending more towards constipation.  She also experiences fibromyalgia type pain at times, most notable when she attempted stopping amitriptyline once.   Past medical history, Surgical history, Family history not pertinant except as noted below, Social history, Allergies, and medications have been entered into the medical record, reviewed, and corrections made.   Review of Systems: See HPI for pertinent positives and negatives.   Depression screen [facility] 2/9 05/12/2020 04/22/2020 09/01/2017 12/02/2016 03/24/2016  Decreased Interest 3 2 1  0 1  Down, Depressed, Hopeless 2 1 1 1 1   PHQ - 2 Score 5 3 2 1 2    Altered sleeping 0 0 0 0 2  Tired, decreased energy 3 3 3  0 2  Change in appetite 1 2 1  0 3  Feeling bad or failure about yourself  2 1 0 0 2  Trouble concentrating 3 - 2 0 1  Moving slowly or fidgety/restless 0 0 1 0 1  Suicidal thoughts 0 0 0 0 0  PHQ-9 Score 14 9 9 1 13   Difficult doing work/chores Extremely dIfficult Extremely dIfficult Somewhat difficult - -  Some encounter information is confidential and restricted. Go to Review Flowsheets activity to see all data.  Some recent data might be hidden   GAD 7 : Generalized Anxiety Score 05/12/2020 04/22/2020 09/01/2017 03/24/2016  Nervous, Anxious, on Edge 3 3 2 2   Control/stop worrying 3 3 2 2   Worry too much - different things 3 3 2 2   Trouble relaxing 3 2 2 2   Restless 2 0 1 2  Easily annoyed or irritable 3 2 2 1   Afraid - awful might happen 1 0 0 1  Total GAD 7 Score 18 13 11 12   Anxiety Difficulty Extremely difficult Very difficult Somewhat difficult Somewhat difficult   Objective:    General: Speaking clearly in complete sentences without any shortness of breath.  Alert and oriented x3.  Normal judgment. No apparent acute distress.  Impression and Recommendations:    1. Generalized anxiety disorder/depression Switching from amitriptyline to Cymbalta.  Taper instructions included in patient instructions and sent via MyChart.  She will take amitriptyline 50 mg daily in conjunction with Cymbalta 30 mg daily for 7 days then reduce amitriptyline to 25 mg  daily and increase Cymbalta to 60 mg daily for 7 days.  After that, she will stop amitriptyline completely and continue Cymbalta at 60 mg daily.  We will follow-up at that point to see how her symptoms are doing and if she needs an increase in her dose.  Advised patient to contact me should she have any side effects or concerning symptoms that develop while she is tapering her medications.  I discussed the assessment and treatment plan with the patient. The patient was provided an  opportunity to ask questions and all were answered. The patient agreed with the plan and demonstrated an understanding of the instructions.   The patient was advised to call back or seek an in-person evaluation if the symptoms worsen or if the condition fails to improve as anticipated.  20 minutes of non-face-to-face time was provided during this encounter.  Return for mood follow up in 3-4 weeks.  Clearnce Sorrel, DNP, APRN, FNP-BC [facility]

## 2020-05-12 NOTE — Patient Instructions (Signed)
Taper instructions for switch from Amitriptyline to Cymbalta:  Take both Amitriptyline 46m daily and Cymbalta 373mdaily for 7 days THEN Take both Amitriptyline 2535maily and Cymbalta 75m71mily for 7 days THEN Stop Amitriptyline and take Cymbalta 75mg65mly.

## 2020-05-15 ENCOUNTER — Other Ambulatory Visit (HOSPITAL_BASED_OUTPATIENT_CLINIC_OR_DEPARTMENT_OTHER): Payer: Self-pay

## 2020-05-15 MED FILL — PREVIDENT 5000 BOOSTER PLUS: 1.1 | 28 days supply | Qty: 100 | Fill #0

## 2020-05-28 ENCOUNTER — Other Ambulatory Visit: Payer: Self-pay | Admitting: Medical-Surgical

## 2020-05-28 MED ORDER — DULOXETINE HCL 30 MG PO CPEP: 90.0000 mg | ORAL_CAPSULE | Freq: Every day | ORAL | 1 refills | Status: DC

## 2020-05-28 NOTE — Addendum Note (Signed)
Addended bySamuel Bouche on: 05/28/2020 12:58 PM   Modules accepted: Orders

## 2020-06-05 ENCOUNTER — Other Ambulatory Visit: Payer: Self-pay | Admitting: Medical-Surgical

## 2020-06-05 MED ORDER — QUETIAPINE FUMARATE 25 MG PO TABS: 25.0000 mg | ORAL_TABLET | Freq: Every day | ORAL | 1 refills | Status: DC

## 2020-06-05 MED FILL — QUETIAPINE 25 MG TABLET: 25 | 30 days supply | Qty: 30 | Fill #0

## 2020-06-05 NOTE — Addendum Note (Signed)
Addended bySamuel Bouche on: 06/05/2020 12:39 PM   Modules accepted: Orders

## 2020-06-16 NOTE — Progress Notes (Signed)
I, Wendy Poet, LAT, ATC, am serving as scribe for Dr. [name].  Tracey Patterson is a 40 y.o. female who presents to [place] [facility] today for R wrist/thumb pain/cyst.  She reports having this issue for the last few months w/ no known MOI.  She was last seen by Dr. Georgina Snell on 09/26/19 for B hip pain.  She locates her current issue specifically to her R radial-sided wrist/hand at the distal thenar eminence volar aspect of the wrist.   R wrist/thumb swelling: yes at the volar aspect of the R thenar eminence R wrist mechanical symptoms: yes Aggravating factors: writing; typing; gripping; R wrist ext and RD; pressure to the area Treatments tried: NSAIDs; ice   Pertinent review of systems: No fevers or chills  Relevant historical information: History of colitis.   Exam:  BP 112/82 (BP Location: Left Arm, Patient Position: Sitting, Cuff Size: Normal)   Pulse 98   Ht 5' 5"  (1.651 m)   Wt 204 lb 12.8 oz (92.9 kg)   SpO2 98%   BMI 34.08 kg/m  General: Well Developed, well nourished, and in no acute distress.   MSK: Right wrist slight swelling at volar radial wrist. Mildly tender palpation prominence volar radial wrist at flexor carpi radialis insertion and at trapezium tuberosity. Normal wrist motion.  Some pain with resisted wrist flexion. Pulses capillary refill and sensation are intact distally.     Lab and Radiology Results  X-ray images right wrist obtained today personally and independently interpreted No fractures or significant degenerative changes. Await formal radiology review  Diagnostic Limited MSK Ultrasound of: Right wrist nodule volar radial wrist No ganglion cyst visible.  Tender nodule is at insertion site of flexor carpi ulnaris tendon and trapezium tuberosity. Slight increased Doppler activity at insertion of FCR tendon. No obvious tendon tear. 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 female with flexor carpi radialis tendinopathy.  Plan for home exercise program with nitroglycerin patch protocol.  Also recommend thumb spica splint as needed.  If not improving in a few weeks patient will notify me will refer to formal hand therapy.  Recheck back in about 6 weeks.  Of note baclofen refilled.   PDMP not reviewed this encounter. Orders Placed This Encounter  Procedures  . [place] LIMITED JOINT SPACE STRUCTURES UP RIGHT(NO LINKED CHARGES)    Order Specific Question:   Reason for Exam (SYMPTOM  OR DIAGNOSIS REQUIRED)    Answer:   R wrist pain    Order Specific Question:   Preferred imaging location?    Answer:   [place] [facility]  . DG Wrist Complete Right    Standing Status:   Future    Number of Occurrences:   1    Standing Expiration Date:   06/17/2021    Order Specific Question:   Reason for Exam (SYMPTOM  OR DIAGNOSIS REQUIRED)    Answer:   eval FCR insertion pain    Order Specific Question:   Is patient pregnant?    Answer:   No    Order Specific Question:   Preferred imaging location?    Answer:   [facility]   Meds ordered this encounter  Medications  . baclofen (LIORESAL) 10 MG tablet    Sig: Take 1-2 tablets (10-20 mg total) by mouth 3 (three) times daily as needed for muscle spasms.    Dispense:  90 each  Refill:  1  . nitroGLYCERIN (NITRODUR - DOSED IN MG/24 HR) 0.2 mg/hr patch    Sig: Apply 1/4 patch daily to tendon for tendonitis.    Dispense:  30 patch    Refill:  1     Discussed warning signs or symptoms. Please see discharge instructions. Patient expresses understanding.   The above documentation has been reviewed and is accurate and complete [name], M.D.

## 2020-06-17 ENCOUNTER — Other Ambulatory Visit: Payer: Self-pay | Admitting: Family Medicine

## 2020-06-17 ENCOUNTER — Encounter: Payer: Self-pay | Admitting: Family Medicine

## 2020-06-17 ENCOUNTER — Ambulatory Visit (INDEPENDENT_AMBULATORY_CARE_PROVIDER_SITE_OTHER): Payer: No Typology Code available for payment source

## 2020-06-17 ENCOUNTER — Ambulatory Visit: Payer: Self-pay

## 2020-06-17 ENCOUNTER — Other Ambulatory Visit: Payer: Self-pay

## 2020-06-17 ENCOUNTER — Ambulatory Visit (INDEPENDENT_AMBULATORY_CARE_PROVIDER_SITE_OTHER): Payer: No Typology Code available for payment source | Admitting: Family Medicine

## 2020-06-17 VITALS — BP 112/82 | HR 98 | Ht 65.0 in | Wt 204.8 lb

## 2020-06-17 DIAGNOSIS — M25531 Pain in right wrist: Secondary | ICD-10-CM

## 2020-06-17 MED ORDER — BACLOFEN 10 MG PO TABS
10.0000 mg | ORAL_TABLET | Freq: Three times a day (TID) | ORAL | 1 refills | Status: DC | PRN
Start: 2020-06-17 — End: 2020-06-17

## 2020-06-17 MED ORDER — NITROGLYCERIN 0.2 MG/HR TD PT24: MEDICATED_PATCH | TRANSDERMAL | 1 refills | Status: DC

## 2020-06-17 MED FILL — NITROGLYCERIN 0.2 MG/HR PTC: 0.2 | 80 days supply | Qty: 20 | Fill #0

## 2020-06-17 MED FILL — BACLOFEN 10 MG TABS: 10 | 15 days supply | Qty: 90 | Fill #0

## 2020-06-17 NOTE — Patient Instructions (Signed)
Thank you for coming in today.  Use long thumb spica splint  Please go to [facility] supply to get the thumb spica splint we talked about today. You may also be able to get it from [employer].   Nitroglycerin Protocol   Apply 1/4 nitroglycerin patch to affected area daily.  Change position of patch within the affected area every 24 hours.  You may experience a headache during the first 1-2 weeks of using the patch, these should subside.  If you experience headaches after beginning nitroglycerin patch treatment, you may take your preferred over the counter pain reliever.  Another side effect of the nitroglycerin patch is skin irritation or rash related to patch adhesive.  Please notify our office if you develop more severe headaches or rash, and stop the patch.  Tendon healing with nitroglycerin patch may require 12 to 24 weeks depending on the extent of injury.  Men should not use if taking Viagra, Cialis, or Levitra.   Do not use if you have migraines or rosacea.   If not improving let me know.    Do 30 reps 2-3x dialy.   Consider theraband flexbar.    Flexor Carpi Ulnaris and Medtronic Radialis Tendinitis Rehab Ask your health care provider which exercises are safe for you. Do exercises exactly as told by your health care provider and adjust them as directed. It is normal to feel mild stretching, pulling, tightness, or discomfort as you do these exercises. Stop right away if you feel sudden pain or your pain gets worse. Do not begin these exercises until told by your health care provider. Range-of-motion exercises These exercises warm up your muscles and joints and improve the movement and flexibility of your forearm. These exercises also help to relieve pain, numbness, and tingling. They are done using the muscles in your injured forearm. Wrist flexion 1. Sit or stand with your left / right elbow bent to a 90-degree angle (right angle) at your side and your palm facing the  floor. 2. Slowly bend your wrist so that your fingers move toward the floor (flexion), stopping when you feel a gentle stretch over the back of your forearm. 3. Hold this position for __________ seconds. 4. Slowly return to the starting position. Repeat __________ times. Complete this exercise __________ times a day. Wrist extension 1. Sit or stand with your left / right elbow bent to a 90-degree angle (right angle) at your side and your palm facing the floor. 2. Slowly lift your wrist up so that your fingers move toward the ceiling (extension), stopping when you feel a gentle stretch on the palm side of your forearm. 3. Hold this position for __________ seconds. 4. Slowly return to the starting position. Repeat __________ times. Complete this exercise __________ times a day. Forearm rotation, supination 1. Sit or stand with your left / right elbow bent to a 90-degree angle (right angle) at your side. Position your forearm so that the thumb is facing the ceiling (neutral position). 2. Turn (rotate) your palm up toward the ceiling (supination) until you feel a gentle stretch on the inside of your forearm. 3. Hold this position for __________ seconds. 4. Slowly return your palm to the starting position. Repeat __________ times. Complete this exercise __________ times a day. Forearm rotation, pronation 1. Sit or stand with your left / right elbow bent to a 90-degree angle (right angle) at your side. Position your forearm so that the thumb is facing the ceiling (neutral position). 2. Turn (rotate)  your palm down toward the floor (pronation) until you feel a gentle stretch on the top of your forearm. 3. Hold this position for __________ seconds. 4. Slowly return your palm to the starting position. Repeat __________ times. Complete this exercise __________ times a day. Stretching exercises These exercises warm up your muscles and joints and improve the movement and flexibility of your forearm.  These exercises also help to relieve pain, numbness, and tingling. They are done using your healthy forearm to help stretch the muscles in your injured forearm. Wrist flexion, assisted 1. Sit or stand and extend your left / right arm in front of you. Turn your palm down toward the floor and relax your wrist. 2. With your other hand (assisted), gently push on the back of your hand to bend your wrist and fingers toward the floor (flexion). Stop when you feel a gentle stretch on the top of your forearm. 3. Hold this position for __________ seconds. 4. Slowly return to the starting position. Repeat __________ times. Complete this exercise __________ times a day.   Wrist extension, assisted 1. Sit or stand and extend your left / right arm in front of you. Turn your palm up toward the ceiling and relax your wrist. 2. With your other hand (assisted), gently pull your palm and fingertips back so your fingers point down toward the floor (extension). You should feel a gentle stretch on the palm-side of your forearm. 3. Hold this position for __________ seconds. 4. Slowly return to the starting position. Repeat __________ times. Complete this exercise __________ times a day.   Forearm rotation, supination 1. Sit with your left / right elbow bent to a 90-degree angle (right angle) with your forearm resting on a table. 2. Keeping your upper body and shoulder still, use your other hand to turn (rotate) your forearm palm-up (supination) until you feel a gentle to moderate stretch. 3. Hold this position for __________ seconds. 4. Slowly release the stretch, and return to the starting position. Repeat __________ times. Complete this exercise __________ times a day. Forearm rotation, pronation 1. Sit with your left / right elbow bent to a 90-degree angle (right angle) with your forearm resting on a table. 2. Keeping your upper body and shoulder still, use your other hand to turn (rotate) your forearm palm down  (pronation) until you feel a gentle to moderate stretch. 3. Hold this position for __________ seconds. 4. Slowly release the stretch, and return to the starting position. Repeat __________ times. Complete this exercise __________ times a day. Strengthening exercises These exercises build strength and endurance in your wrist and forearm. Endurance is the ability to use your muscles for a long time, even after they get tired. Wrist flexion 1. Sit with your left / right forearm supported on a table and your hand resting palm-up over the edge of the table. Your elbow should be bent to a 90-degree angle (right angle) and rest below the level of your shoulder. 2. Hold a __________ weight in your hand. Or, hold a rubber exercise band or tube in both hands, keeping your hands at the same level and hip distance apart. There should be a slight tension in the exercise band or tube. 3. Slowly lift your hand up toward the ceiling (flexion). 4. Hold this position for __________ seconds. 5. Slowly lower your hand back to the starting position. Repeat __________ times. Complete this exercise __________ times a day.   Wrist extension 1. Sit with your left / right forearm supported  on a table and your hand resting palm-down over the edge of the table. Your elbow should be bent to a 90-degree angle (right angle) and rest below the level of your shoulder. 2. Hold a __________ weight in your hand. Or, hold a rubber exercise band or tube in both hands, keeping your hands at the same level and hip distance apart. There should be a slight tension in the exercise band or tube. 3. Slowly lift your hand up toward the ceiling (extension). 4. Hold this position for __________ seconds. 5. Slowly lower your hand back to the starting position. Repeat __________ times. Complete this exercise __________ times a day.   Ulnar deviation 1. Stand with a __________ weight in your left / right hand. Or, sit with your healthy hand  supported, and hold on to a rubber exercise band or tube with both hands. There should be a slight tension in the exercise band or tube. 2. Move your wrist so your pinkie travels toward your forearm and your thumb moves away from your forearm (ulnar deviation). 3. Hold this position for __________ seconds. 4. Slowly return to the starting position. Repeat __________ times. Complete this exercise __________ times a day. Radial deviation 1. Stand with a __________ weight in your left / right hand. Or, sit and hold on to a rubber exercise band or tube with both hands while your left / right arm is supported on a table and the other arm is below the table. There should be a slight tension in the exercise band or tube. ? If you are holding a weight, raise your left / right hand so your thumb moves toward your forearm at a comfortable height. You will not need to raise your hand very far. ? If you are holding an exercise band or tube, pull on it to raise your thumb toward the ceiling. You will not need to raise your hand very far. 2. Hold this position for __________ seconds. 3. Slowly lower your wrist to the starting position. Repeat __________ times. Complete this exercise __________ times a day. Forearm rotation, supination 1. Sit with your left / right forearm supported on a table. Keep your hand resting palm-down and your wrist over the edge of the table. Your elbow should be at your side and bent to a 90-degree angle (right angle). Keep your wrist stable. Do not allow it to bend backward or forward during the exercise. 2. Gently hold a lightweight hammer with your left / right hand. 3. Without moving your elbow or wrist, slowly turn (rotate) your forearm so your palm faces up toward the ceiling (supination). 4. Hold this position for __________ seconds. 5. Slowly return your forearm to the starting position. Repeat __________ times. Complete this exercise __________ times a day.   Forearm  rotation, pronation 1. Sit with your left / right forearm supported on a table. Keep your hand resting palm-up and your wrist over the edge of the table. Your elbow should be at your side and bent to a 90-degree angle (right angle). Keep your wrist stable. Do not allow it to bend backward or forward during the exercise. 2. Gently hold a lightweight hammer with your left / right hand. 3. Without moving your elbow or wrist, slowly turn (rotate) your palm down toward the floor (pronation). 4. Hold this position for __________ seconds. 5. Slowly return your forearm to the starting position. Repeat __________ times. Complete this exercise __________ times a day.   Grip 1. Hold one of  these items in your left / right hand: modeling clay, therapy putty, a dense sponge, a stress ball, or a large, rolled sock. 2. Squeeze as hard as you can without increasing any pain. 3. Hold this position for __________ seconds. 4. Slowly release your grip. Repeat __________ times. Complete this exercise __________ times a day.   This information is not intended to replace advice given to you by your health care provider. Make sure you discuss any questions you have with your health care provider. Document Revised: 07/11/2018 Document Reviewed: 05/18/2018 Elsevier Patient Education  [place].

## 2020-06-18 MED FILL — DULoxetine HCL 30 MG CPEP: 30 | 30 days supply | Qty: 90 | Fill #0

## 2020-06-18 MED FILL — buPROPion HCL ER (XL) 300 M: 300 | 90 days supply | Qty: 90 | Fill #2

## 2020-06-18 NOTE — Progress Notes (Signed)
X-ray right wrist shows no acute changes.  A small ulnar styloid ossicle is noted which is probably not causing your symptoms.

## 2020-06-28 ENCOUNTER — Other Ambulatory Visit (HOSPITAL_BASED_OUTPATIENT_CLINIC_OR_DEPARTMENT_OTHER): Payer: Self-pay

## 2020-07-04 ENCOUNTER — Telehealth: Payer: No Typology Code available for payment source | Admitting: Nurse Practitioner

## 2020-07-04 DIAGNOSIS — R059 Cough, unspecified: Secondary | ICD-10-CM

## 2020-07-04 MED ORDER — BENZONATATE 100 MG PO CAPS: 100.0000 mg | ORAL_CAPSULE | Freq: Three times a day (TID) | ORAL | 0 refills | Status: DC | PRN

## 2020-07-04 MED ORDER — PREDNISONE 10 MG (21) PO TBPK: ORAL_TABLET | ORAL | 0 refills | Status: DC

## 2020-07-04 NOTE — Progress Notes (Signed)
We are sorry that you are not feeling well.  Here is how we plan to help!  Based on your presentation I believe you most likely have A cough due to a virus.  This is called viral bronchitis and is best treated by rest, plenty of fluids and control of the cough.  You may use Ibuprofen or Tylenol as directed to help your symptoms.     In addition you may use A prescription cough medication called Tessalon Perles 168m. You may take 1-2 capsules every 8 hours as needed for your cough. Can not do hycodan in an evisit  Prednisone 10 mg daily for 6 days (see taper instructions below)  Directions for 6 day taper: Day 1: 2 tablets before breakfast, 1 after both lunch & dinner and 2 at bedtime Day 2: 1 tab before breakfast, 1 after both lunch & dinner and 2 at bedtime Day 3: 1 tab at each meal & 1 at bedtime Day 4: 1 tab at breakfast, 1 at lunch, 1 at bedtime Day 5: 1 tab at breakfast & 1 tab at bedtime Day 6: 1 tab at breakfast   From your responses in the eVisit questionnaire you describe inflammation in the upper respiratory tract which is causing a significant cough.  This is commonly called Bronchitis and has four common causes:    Allergies  Viral Infections  Acid Reflux  Bacterial Infection Allergies, viruses and acid reflux are treated by controlling symptoms or eliminating the cause. An example might be a cough caused by taking certain blood pressure medications. You stop the cough by changing the medication. Another example might be a cough caused by acid reflux. Controlling the reflux helps control the cough.  USE OF BRONCHODILATOR ("RESCUE") INHALERS: There is a risk from using your bronchodilator too frequently.  The risk is that over-reliance on a medication which only relaxes the muscles surrounding the breathing tubes can reduce the effectiveness of medications prescribed to reduce swelling and congestion of the tubes themselves.  Although you feel brief relief from the  bronchodilator inhaler, your asthma may actually be worsening with the tubes becoming more swollen and filled with mucus.  This can delay other crucial treatments, such as oral steroid medications. If you need to use a bronchodilator inhaler daily, several times per day, you should discuss this with your provider.  There are probably better treatments that could be used to keep your asthma under control.     HOME CARE . Only take medications as instructed by your medical team. . Complete the entire course of an antibiotic. . Drink plenty of fluids and get plenty of rest. . Avoid close contacts especially the very young and the elderly . Cover your mouth if you cough or cough into your sleeve. . Always remember to wash your hands . A steam or ultrasonic humidifier can help congestion.   GET HELP RIGHT AWAY IF: . You develop worsening fever. . You become short of breath . You cough up blood. . Your symptoms persist after you have completed your treatment plan MAKE SURE YOU   Understand these instructions.  Will watch your condition.  Will get help right away if you are not doing well or get worse.  Your e-visit answers were reviewed by a board certified advanced clinical practitioner to complete your personal care plan.  Depending on the condition, your plan could have included both over the counter or prescription medications. If there is a problem please reply  once you have  received a response from your provider. Your safety is important to [place].  If you have drug allergies check your prescription carefully.    You can use MyChart to ask questions about today's visit, request a non-urgent call back, or ask for a work or school excuse for 24 hours related to this e-Visit. If it has been greater than 24 hours you will need to follow up with your provider, or enter a new e-Visit to address those concerns. You will get an e-mail in the next two days asking about your experience.  I hope that  your e-visit has been valuable and will speed your recovery. Thank you for using e-visits.  5-10 minutes spent reviewing and documenting in chart.

## 2020-07-06 MED FILL — Quetiapine Fumarate Tab 25 MG: ORAL | 30 days supply | Qty: 30 | Fill #0 | Status: AC

## 2020-07-07 ENCOUNTER — Other Ambulatory Visit (HOSPITAL_BASED_OUTPATIENT_CLINIC_OR_DEPARTMENT_OTHER): Payer: Self-pay

## 2020-07-09 NOTE — Progress Notes (Addendum)
Virtual Visit via Video Note  I connected with Tracey Patterson on 07/10/20 at  9:10 AM EDT by a video enabled telemedicine application and verified that I am speaking with the correct person using two identifiers.   I discussed the limitations of evaluation and management by telemedicine and the availability of in person appointments. The patient expressed understanding and agreed to proceed.  Patient location: home Provider locations: office  Subjective:    CC: fatigue/mood follow up  HPI: Pleasant 40 year old female presenting via MyChart video visit for follow up on fatigue and mood. Was previously treated with Amitriptyline 175m nightly but complained of profound fatigue. We lowered the dose to 723mnightly but this was not helpful. A switch to Cymbalta was made followed by a dose increase from 604mo 48m3mhe was struggling to sleep so we added in Seroquel.  Notes that she does have a bit more energy than she did previously.  Seroquel 25 mg is helping her sleep most nights.  Some nights she does still struggle to get a decent amount of sleep.  Feels that inattention is the most annoying thing that bothers her right now.  She has difficulty completing tasks and assignments without getting distracted.  Has never been tested for ADHD but her son does have it so it would not be unreasonable for her to have it as well.  Is interested in being tested.  Has had 2-1/2 to 3 weeks of upper respiratory symptoms.  She did do an E-visit and was prescribed a prednisone taper which she is nearly done with but her symptoms have not improved.  She has been taking Mucinex cold and flu with temporary moderate relief of symptoms.  Continues to have significant nasal congestion, maxillary facial pain and pressure, and a cough productive of thick green sputum.  No fevers, chills, or GI symptoms.  Past medical history, Surgical history, Family history not pertinant except as noted below, Social history,  Allergies, and medications have been entered into the medical record, reviewed, and corrections made.   Review of Systems: See HPI for pertinent positives and negatives.   Depression screen PHQ [facility] 07/10/2020 05/12/2020 04/22/2020 09/01/2017 12/02/2016  Decreased Interest 1 3 2 1  0  Down, Depressed, Hopeless 1 2 1 1 1   PHQ - 2 Score 2 5 3 2 1   Altered sleeping 2 0 0 0 0  Tired, decreased energy 2 3 3 3  0  Change in appetite 2 1 2 1  0  Feeling bad or failure about yourself  0 2 1 0 0  Trouble concentrating 2 3 - 2 0  Moving slowly or fidgety/restless 0 0 0 1 0  Suicidal thoughts 0 0 0 0 0  PHQ-9 Score 10 14 9 9 1   Difficult doing work/chores Somewhat difficult Extremely dIfficult Extremely dIfficult Somewhat difficult -  Some encounter information is confidential and restricted. Go to Review Flowsheets activity to see all data.  Some recent data might be hidden   GAD 7 : Generalized Anxiety Score 07/10/2020 05/12/2020 04/22/2020 09/01/2017  Nervous, Anxious, on Edge 1 3 3 2   Control/stop worrying 2 3 3 2   Worry too much - different things 1 3 3 2   Trouble relaxing 1 3 2 2   Restless 2 2 0 1  Easily annoyed or irritable 1 3 2 2   Afraid - awful might happen 0 1 0 0  Total GAD 7 Score 8 18 13 11   Anxiety Difficulty Somewhat difficult Extremely difficult Very difficult Somewhat difficult  Objective:    General: Speaking clearly in complete sentences without any shortness of breath.  Alert and oriented x3.  Normal judgment. No apparent acute distress.  Impression and Recommendations:    1. Generalized anxiety disorder 2. Depression, recurrent ([place]) 3.  Inattention Continue Cymbalta 90 mg nightly.  Seroquel 25-50 mg nightly as needed for sleep.  Referring to psychology to initiate ADHD testing.  Discussed options available at home and online to help with task completion.  4. Acute non-recurrent maxillary sinusitis Augmentin twice daily x7 days.  Continue supportive and conservative treatment  at home.  I discussed the assessment and treatment plan with the patient. The patient was provided an opportunity to ask questions and all were answered. The patient agreed with the plan and demonstrated an understanding of the instructions.   The patient was advised to call back or seek an in-person evaluation if the symptoms worsen or if the condition fails to improve as anticipated.  20 minutes of non-face-to-face time was provided during this encounter.  Return in about 3 months (around 10/09/2020) for mood follow up.  Clearnce Sorrel, DNP, APRN, FNP-BC [facility]

## 2020-07-10 ENCOUNTER — Encounter: Payer: Self-pay | Admitting: Medical-Surgical

## 2020-07-10 ENCOUNTER — Telehealth (INDEPENDENT_AMBULATORY_CARE_PROVIDER_SITE_OTHER): Payer: No Typology Code available for payment source | Admitting: Medical-Surgical

## 2020-07-10 ENCOUNTER — Other Ambulatory Visit (HOSPITAL_BASED_OUTPATIENT_CLINIC_OR_DEPARTMENT_OTHER): Payer: Self-pay

## 2020-07-10 DIAGNOSIS — R41840 Attention and concentration deficit: Secondary | ICD-10-CM | POA: Diagnosis not present

## 2020-07-10 DIAGNOSIS — F411 Generalized anxiety disorder: Secondary | ICD-10-CM | POA: Diagnosis not present

## 2020-07-10 DIAGNOSIS — F339 Major depressive disorder, recurrent, unspecified: Secondary | ICD-10-CM

## 2020-07-10 DIAGNOSIS — J0100 Acute maxillary sinusitis, unspecified: Secondary | ICD-10-CM | POA: Diagnosis not present

## 2020-07-10 MED ORDER — AMOXICILLIN-POT CLAVULANATE 875-125 MG PO TABS
1.0000 | ORAL_TABLET | Freq: Two times a day (BID) | ORAL | 0 refills | Status: DC
  Filled 2020-07-10: qty 14, 7d supply, fill #0

## 2020-07-10 MED ORDER — DULOXETINE HCL 30 MG PO CPEP
90.0000 mg | ORAL_CAPSULE | Freq: Every evening | ORAL | 1 refills | Status: DC
  Filled 2020-07-10 – 2020-07-21 (×2): qty 270, 90d supply, fill #0
  Filled 2020-10-16: qty 270, 90d supply, fill #1

## 2020-07-10 MED ORDER — QUETIAPINE FUMARATE 25 MG PO TABS
25.0000 mg | ORAL_TABLET | Freq: Every day | ORAL | 1 refills | Status: DC
Start: 2020-07-10 — End: 2021-02-08
  Filled 2020-07-10 – 2020-08-07 (×2): qty 180, 90d supply, fill #0
  Filled 2020-11-04: qty 180, 90d supply, fill #1

## 2020-07-10 NOTE — Addendum Note (Signed)
Addended bySamuel Bouche on: 07/10/2020 09:20 AM   Modules accepted: Orders

## 2020-07-11 ENCOUNTER — Encounter: Payer: Self-pay | Admitting: Medical-Surgical

## 2020-07-22 ENCOUNTER — Other Ambulatory Visit (HOSPITAL_BASED_OUTPATIENT_CLINIC_OR_DEPARTMENT_OTHER): Payer: Self-pay

## 2020-07-28 NOTE — Progress Notes (Signed)
   Rito Ehrlich, am serving as a Education administrator for Dr. [name].  Tracey Patterson is a 40 y.o. female who presents to [place] [facility] today for f/u of R distal radial-sided wrist/hand pain due to a cyst.  She was last seen by Dr. Georgina Snell on 06/17/20 and was diagnosed w/ FCR tendinopathy.  She was shown a HEP and was prescribed nitroglycerin patches.  She was also advised to purchase a thumb spica splint.  Since her last visit, pt reports that her wrist has been doing a lot better. The cyst is still present but not bothering her as much. Patient has been doing the exercises and is only experiencing pain when doing yard work.  Overall she is feeling much better.  Diagnostic imaging: R wrist XR- 06/17/20  Pertinent review of systems: No fevers or chills  Relevant historical information: ANA positive   Exam:  BP (!) 150/90 (BP Location: Left Arm, Patient Position: Sitting, Cuff Size: Normal)   Pulse (!) 103   Ht 5' 5"  (1.651 m)   Wt 205 lb (93 kg)   SpO2 97%   BMI 34.11 kg/m  General: Well Developed, well nourished, and in no acute distress.   MSK: Right wrist slightly prominent flexor carpi radialis insertion onto volar wrist but otherwise normal-appearing Nontender normal wrist motion and strength.    Lab and Radiology Results  EXAM: RIGHT WRIST - COMPLETE 3+ VIEW  COMPARISON:  None.  FINDINGS: There is no evidence of fracture or dislocation. There is no evidence of arthropathy or other focal bone abnormality. The carpal alignment and joint spaces are maintained. A punctate ulnar styloid ossicle is noted. Soft tissues are unremarkable.  IMPRESSION: No acute fracture, malalignment, or significant wrist arthropathy.   Electronically Signed   By: Ruthann Cancer MD   On: 06/18/2020 09:07 I, [name], personally (independently) visualized and performed the interpretation of the images attached in this note.     Assessment and Plan: 40 y.o.  female with right wrist pain.  Thought to be due to flexor carpi ulnaris insertional tendinopathy.  Significantly improved with a bit of time and home exercise program.  Neck step if needed would be nitroglycerin patch protocol and referral to dedicated hand therapy.  However patient is feeling much better.  Plan for continued home exercise program and watchful waiting.  Recommend a thumb loop wrist brace/strap for heavy duty activity like gardening but otherwise she should do just fine.  Recheck back with me as needed.  If needed next diagnostic step would probably be noncontrast MRI.    Discussed warning signs or symptoms. Please see discharge instructions. Patient expresses understanding.   The above documentation has been reviewed and is accurate and complete [name], M.D.    Total encounter time 20 minutes including face-to-face time with the patient and, reviewing past medical record, and charting on the date of service.   Treatment plan and options

## 2020-07-29 ENCOUNTER — Encounter: Payer: Self-pay | Admitting: Medical-Surgical

## 2020-07-29 ENCOUNTER — Ambulatory Visit: Payer: No Typology Code available for payment source | Admitting: Family Medicine

## 2020-07-29 ENCOUNTER — Other Ambulatory Visit: Payer: Self-pay

## 2020-07-29 ENCOUNTER — Encounter: Payer: Self-pay | Admitting: Family Medicine

## 2020-07-29 VITALS — BP 150/90 | HR 103 | Ht 65.0 in | Wt 205.0 lb

## 2020-07-29 DIAGNOSIS — M25531 Pain in right wrist: Secondary | ICD-10-CM | POA: Diagnosis not present

## 2020-07-29 NOTE — Patient Instructions (Signed)
Thank you for coming in today.  Continue the exercises.  Next step if needed could be dedicated hand therapy + nitroglycerine patches.   Right now that is probably not needed.   Consider a thumb loop wrist brace/strap (often used for weight lifting)  Recheck with me as needed.

## 2020-08-08 ENCOUNTER — Other Ambulatory Visit (HOSPITAL_BASED_OUTPATIENT_CLINIC_OR_DEPARTMENT_OTHER): Payer: Self-pay

## 2020-09-10 ENCOUNTER — Other Ambulatory Visit (HOSPITAL_BASED_OUTPATIENT_CLINIC_OR_DEPARTMENT_OTHER): Payer: Self-pay

## 2020-09-10 MED FILL — Pantoprazole Sodium EC Tab 40 MG (Base Equiv): ORAL | 90 days supply | Qty: 180 | Fill #0 | Status: AC

## 2020-09-11 ENCOUNTER — Other Ambulatory Visit (HOSPITAL_BASED_OUTPATIENT_CLINIC_OR_DEPARTMENT_OTHER): Payer: Self-pay

## 2020-09-22 ENCOUNTER — Other Ambulatory Visit (HOSPITAL_BASED_OUTPATIENT_CLINIC_OR_DEPARTMENT_OTHER): Payer: Self-pay

## 2020-09-22 MED FILL — Bupropion HCl Tab ER 24HR 300 MG: ORAL | 90 days supply | Qty: 90 | Fill #0 | Status: CN

## 2020-09-25 ENCOUNTER — Ambulatory Visit (INDEPENDENT_AMBULATORY_CARE_PROVIDER_SITE_OTHER): Payer: No Typology Code available for payment source | Admitting: Medical-Surgical

## 2020-09-25 ENCOUNTER — Other Ambulatory Visit: Payer: Self-pay

## 2020-09-25 ENCOUNTER — Ambulatory Visit (INDEPENDENT_AMBULATORY_CARE_PROVIDER_SITE_OTHER): Payer: No Typology Code available for payment source

## 2020-09-25 ENCOUNTER — Encounter: Payer: Self-pay | Admitting: Medical-Surgical

## 2020-09-25 VITALS — BP 120/86 | HR 96 | Temp 98.4°F | Ht 65.0 in | Wt 211.3 lb

## 2020-09-25 DIAGNOSIS — Z0000 Encounter for general adult medical examination without abnormal findings: Secondary | ICD-10-CM

## 2020-09-25 DIAGNOSIS — R42 Dizziness and giddiness: Secondary | ICD-10-CM

## 2020-09-25 DIAGNOSIS — R002 Palpitations: Secondary | ICD-10-CM

## 2020-09-25 DIAGNOSIS — R0602 Shortness of breath: Secondary | ICD-10-CM

## 2020-09-25 NOTE — Progress Notes (Unsigned)
Patient enrolled for [employer] to mail a 14 day ZIO XT monitor to address on file.

## 2020-09-25 NOTE — Progress Notes (Signed)
Subjective:    CC: Dizziness and blood pressure issues  HPI: Pleasant 40 year old female presenting today with reports of a few weeks of concerning symptoms including palpitations at night, intermittent head pressure with roaring in her ears, and a weird feeling to her left upper chest.  She has had no significant worsening of her normal shortness of breath.  She has had some headaches as well as an elevation in heart rate with her resting heart rate at 100-110.  Has had periods of cold sweating and does note dry skin.  When she was having her headache yesterday, she took her blood pressure with a reading of 139/90.  She does have a history of anxiety and is currently taking medications for treatment.  Feels that this is fairly well controlled although when she gets her symptoms, she begins to feel anxious regarding it.  Denies anxiety prior to the development of these concerning symptoms.  Denies fever, chest pain, dyspnea at rest, and GI symptoms.  Feels her GERD is well controlled with the use of Protonix.  I reviewed the past medical history, family history, social history, surgical history, and allergies today and no changes were needed.  Please see the problem list section below in epic for further details.  Past Medical History: Past Medical History:  Diagnosis Date   Anxiety    Arthritis    Asthma    Chronic headache    Depression    Fibromyalgia    GERD (gastroesophageal reflux disease)    History of TMJ disorder    IBS (irritable bowel syndrome)    Obesity    Pneumonia    Past Surgical History: Past Surgical History:  Procedure Laterality Date   BACK SURGERY  December 2015   KNEE ARTHROSCOPY  1998   left   LAPAROSCOPIC TUBAL LIGATION  02/28/2012   Procedure: LAPAROSCOPIC TUBAL LIGATION;  Surgeon: Cyril Mourning, MD;  Location: [facility];  Service: Gynecology;  Laterality: Bilateral;   TONSILLECTOMY     Social History: Social History   Socioeconomic History   Marital  status: Married    Spouse name: Not on file   Number of children: 3   Years of education: Not on file   Highest education level: Not on file  Occupational History   Not on file  Tobacco Use   Smoking status: Never   Smokeless tobacco: Never  Vaping Use   Vaping Use: Never used  Substance and Sexual Activity   Alcohol use: Yes   Drug use: No   Sexual activity: Not Currently  Other Topics Concern   Not on file  Social History Narrative   Marital Status: Married Aeronautical engineer)    Children:  Son Air cabin crew) Daughter Therapist, occupational)    Pets: Dogs (1) Cats (2)    Living Situation: Lives with  husband and children    Occupation: Radiology [employer]    Education: Bachelor's Degree in Science    Tobacco Use/Exposure:  None    Alcohol Use:  Occasional   Drug Use:  None   Diet:  Regular   Exercise:  None   Hobbies:  Reading          Social Determinants of Health   Financial Resource Strain: Not on file  Food Insecurity: Not on file  Transportation Needs: Not on file  Physical Activity: Not on file  Stress: Not on file  Social Connections: Not on file   Family History: Family History  Problem Relation Age of  Onset   Hypertension Mother    Depression Mother    Hypertension Father    Breast cancer Maternal Aunt    Prostate cancer Maternal Uncle    Prostate cancer Maternal Uncle    Colon cancer Maternal Uncle    Esophageal cancer Neg Hx    Allergies: Allergies  Allergen Reactions   Crab [Shellfish Allergy] Other (See Comments)    Allergist advises against eating crab.   Sulfa Antibiotics Hives   Zoloft [Sertraline Hcl] Other (See Comments)    Hair loss   Other Rash    Dermabond, steri strips   Medications: See med rec.  Review of Systems: See HPI for pertinent positives and negatives.   Objective:    General: Well Developed, well nourished, and in no acute distress.  Neuro: Alert and oriented x3.  HEENT: Normocephalic, atraumatic.  Skin: Warm and  dry. Cardiac: Regular rate and rhythm, no murmurs rubs or gallops, no lower extremity edema.  Intermittent PVCs. Respiratory: Clear to auscultation bilaterally. Not using accessory muscles, speaking in full sentences.  In office EKG-sinus rhythm with intermittent PVCs, rate 96, normal axis.  Impression and Recommendations:    1. Dizziness 2. Palpitations 3. Shortness of breath In office EKG completed showing sinus rhythm but she does have some intermittent PVCs.  We will check blood with CBC with differential, CMP, TSH, magnesium.  Getting chest x-ray.  Ultimately, we will get a long-term heart monitor for further information.  Reviewed the duration and recommendations for monitoring symptoms while wearing the long-term monitor.  Patient verbalized understanding and is agreeable to the plan.  Reviewed emergency symptoms to seek urgent care/ED evaluation for. - EKG 12-Lead - CBC with Differential/Platelet - COMPLETE METABOLIC PANEL WITH GFR - TSH - Magnesium - DG Chest 2 View; Future - LONG TERM MONITOR (3-14 DAYS); Future  4. Preventative health care Checking lipid panel today. - Lipid panel  Return if symptoms worsen or fail to improve.  Further follow-up pending imaging and lab results. ___________________________________________ Clearnce Sorrel, DNP, APRN, FNP-BC Primary Care and Sports Medicine [place]

## 2020-09-26 ENCOUNTER — Encounter: Payer: Self-pay | Admitting: Medical-Surgical

## 2020-09-26 LAB — LIPID PANEL
Cholesterol: 210 mg/dL — ABNORMAL HIGH (ref <200)
HDL: 55 mg/dL (ref >=50)
LDL Cholesterol (Calc): 125 mg/dL (calc) — ABNORMAL HIGH
Non-HDL Cholesterol (Calc): 155 mg/dL (calc) — ABNORMAL HIGH (ref <130)
Total CHOL/HDL Ratio: 3.8 (calc) (ref <5.0)
Triglycerides: 188 mg/dL — ABNORMAL HIGH (ref <150)

## 2020-09-26 LAB — CBC WITH DIFFERENTIAL/PLATELET
Absolute Monocytes: 521 cells/uL (ref 200–950)
Basophils Absolute: 102 cells/uL (ref 0–200)
Basophils Relative: 1.1 %
Eosinophils Absolute: 233 cells/uL (ref 15–500)
Eosinophils Relative: 2.5 %
HCT: 39.2 % (ref 35.0–45.0)
Hemoglobin: 12.9 g/dL (ref 11.7–15.5)
Lymphs Abs: 3292 cells/uL (ref 850–3900)
MCH: 30.6 pg (ref 27.0–33.0)
MCHC: 32.9 g/dL (ref 32.0–36.0)
MCV: 93.1 fL (ref 80.0–100.0)
MPV: 9.4 fL (ref 7.5–12.5)
Monocytes Relative: 5.6 %
Neutro Abs: 5152 cells/uL (ref 1500–7800)
Neutrophils Relative %: 55.4 %
Platelets: 437 10*3/uL — ABNORMAL HIGH (ref 140–400)
RBC: 4.21 10*6/uL (ref 3.80–5.10)
RDW: 13.8 % (ref 11.0–15.0)
Total Lymphocyte: 35.4 %
WBC: 9.3 10*3/uL (ref 3.8–10.8)

## 2020-09-26 LAB — COMPLETE METABOLIC PANEL WITH GFR
AG Ratio: 1.8 (calc) (ref 1.0–2.5)
ALT: 21 U/L (ref 6–29)
AST: 16 U/L (ref 10–30)
Albumin: 4.4 g/dL (ref 3.6–5.1)
Alkaline phosphatase (APISO): 138 U/L — ABNORMAL HIGH (ref 31–125)
BUN: 8 mg/dL (ref 7–25)
CO2: 27 mmol/L (ref 20–32)
Calcium: 9.4 mg/dL (ref 8.6–10.2)
Chloride: 104 mmol/L (ref 98–110)
Creat: 0.85 mg/dL (ref 0.50–1.10)
GFR, Est African American: 98 mL/min/{1.73_m2} (ref >=60)
GFR, Est Non African American: 85 mL/min/{1.73_m2} (ref >=60)
Globulin: 2.5 g/dL (calc) (ref 1.9–3.7)
Glucose, Bld: 85 mg/dL (ref 65–139)
Potassium: 4.3 mmol/L (ref 3.5–5.3)
Sodium: 140 mmol/L (ref 135–146)
Total Bilirubin: 0.4 mg/dL (ref 0.2–1.2)
Total Protein: 6.9 g/dL (ref 6.1–8.1)

## 2020-09-26 LAB — MAGNESIUM: Magnesium: 1.9 mg/dL (ref 1.5–2.5)

## 2020-09-26 LAB — TSH: TSH: 1.39 mIU/L

## 2020-09-30 ENCOUNTER — Other Ambulatory Visit (HOSPITAL_BASED_OUTPATIENT_CLINIC_OR_DEPARTMENT_OTHER): Payer: Self-pay

## 2020-09-30 ENCOUNTER — Encounter: Payer: Self-pay | Admitting: Medical-Surgical

## 2020-09-30 DIAGNOSIS — R002 Palpitations: Secondary | ICD-10-CM | POA: Diagnosis not present

## 2020-09-30 DIAGNOSIS — R0602 Shortness of breath: Secondary | ICD-10-CM | POA: Diagnosis not present

## 2020-09-30 DIAGNOSIS — R42 Dizziness and giddiness: Secondary | ICD-10-CM

## 2020-09-30 MED FILL — Bupropion HCl Tab ER 24HR 300 MG: ORAL | 90 days supply | Qty: 90 | Fill #0 | Status: AC

## 2020-10-16 ENCOUNTER — Other Ambulatory Visit (HOSPITAL_BASED_OUTPATIENT_CLINIC_OR_DEPARTMENT_OTHER): Payer: Self-pay

## 2020-10-16 MED FILL — Sodium Fluoride Paste 1.1%: DENTAL | 30 days supply | Qty: 100 | Fill #0 | Status: AC

## 2020-10-17 ENCOUNTER — Other Ambulatory Visit (HOSPITAL_BASED_OUTPATIENT_CLINIC_OR_DEPARTMENT_OTHER): Payer: Self-pay

## 2020-10-30 ENCOUNTER — Other Ambulatory Visit (HOSPITAL_BASED_OUTPATIENT_CLINIC_OR_DEPARTMENT_OTHER): Payer: Self-pay

## 2020-10-30 DIAGNOSIS — I493 Ventricular premature depolarization: Secondary | ICD-10-CM | POA: Insufficient documentation

## 2020-10-30 DIAGNOSIS — K589 Irritable bowel syndrome without diarrhea: Secondary | ICD-10-CM | POA: Insufficient documentation

## 2020-10-30 MED ORDER — CLOTRIMAZOLE-BETAMETHASONE 1-0.05 % EX CREA
TOPICAL_CREAM | CUTANEOUS | 0 refills | Status: DC
Start: 2020-10-30 — End: 2021-01-04
  Filled 2020-10-30: qty 15, 14d supply, fill #0

## 2020-10-31 ENCOUNTER — Other Ambulatory Visit: Payer: Self-pay | Admitting: Medical-Surgical

## 2020-10-31 DIAGNOSIS — R0602 Shortness of breath: Secondary | ICD-10-CM

## 2020-10-31 DIAGNOSIS — R42 Dizziness and giddiness: Secondary | ICD-10-CM

## 2020-10-31 DIAGNOSIS — R002 Palpitations: Secondary | ICD-10-CM

## 2020-10-31 NOTE — Addendum Note (Signed)
Addended bySamuel Bouche on: 10/31/2020 11:21 AM   Modules accepted: Orders

## 2020-11-03 LAB — HM PAP SMEAR: HM Pap smear: NEGATIVE

## 2020-11-04 ENCOUNTER — Telehealth: Payer: No Typology Code available for payment source | Admitting: Physician Assistant

## 2020-11-04 ENCOUNTER — Other Ambulatory Visit (HOSPITAL_BASED_OUTPATIENT_CLINIC_OR_DEPARTMENT_OTHER): Payer: Self-pay

## 2020-11-04 DIAGNOSIS — J329 Chronic sinusitis, unspecified: Secondary | ICD-10-CM | POA: Diagnosis not present

## 2020-11-04 MED ORDER — AMOXICILLIN-POT CLAVULANATE 875-125 MG PO TABS
1.0000 | ORAL_TABLET | Freq: Two times a day (BID) | ORAL | 0 refills | Status: AC
  Filled 2020-11-04: qty 14, 7d supply, fill #0

## 2020-11-04 NOTE — Progress Notes (Signed)

## 2020-11-05 ENCOUNTER — Other Ambulatory Visit (HOSPITAL_BASED_OUTPATIENT_CLINIC_OR_DEPARTMENT_OTHER): Payer: Self-pay

## 2020-11-27 DIAGNOSIS — U071 COVID-19: Secondary | ICD-10-CM

## 2020-11-27 HISTORY — DX: COVID-19: U07.1

## 2020-12-21 ENCOUNTER — Telehealth: Payer: No Typology Code available for payment source | Admitting: Family

## 2020-12-21 DIAGNOSIS — U071 COVID-19: Secondary | ICD-10-CM

## 2020-12-21 MED ORDER — FLUTICASONE PROPIONATE 50 MCG/ACT NA SUSP
2.0000 | Freq: Every day | NASAL | 6 refills | Status: DC
Start: 2020-12-21 — End: 2021-07-17

## 2020-12-21 MED ORDER — ALBUTEROL SULFATE HFA 108 (90 BASE) MCG/ACT IN AERS
2.0000 | INHALATION_SPRAY | Freq: Four times a day (QID) | RESPIRATORY_TRACT | 0 refills | Status: DC | PRN
Start: 2020-12-21 — End: 2022-02-17

## 2020-12-21 MED ORDER — BENZONATATE 100 MG PO CAPS: 100.0000 mg | ORAL_CAPSULE | Freq: Three times a day (TID) | ORAL | 0 refills | Status: DC | PRN

## 2020-12-21 NOTE — Progress Notes (Signed)
E-Visit  for Positive Covid Test Result  We are sorry you are not feeling well. We are here to help!  You have tested positive for COVID-19, meaning that you were infected with the novel coronavirus and could give the virus to others.  It is vitally important that you stay home so you do not spread it to others.      Please continue isolation at home, for at least 10 days since the start of your symptoms and until you have had 24 hours with no fever (without taking a fever reducer) and with improving of symptoms.  If you have no symptoms but tested positive (or all symptoms resolve after 5 days and you have no fever) you can leave your house but continue to wear a mask around others for an additional 5 days. If you have a fever,continue to stay home until you have had 24 hours of no fever. Most cases improve 5-10 days from onset but we have seen a small number of patients who have gotten worse after the 10 days.  Please be sure to watch for worsening symptoms and remain taking the proper precautions.   Go to the nearest hospital ED for assessment if fever/cough/breathlessness are severe or illness seems like a threat to life.    The following symptoms may appear 2-14 days after exposure: Fever Cough Shortness of breath or difficulty breathing Chills Repeated shaking with chills Muscle pain Headache Sore throat New loss of taste or smell Fatigue Congestion or runny nose Nausea or vomiting Diarrhea  You have been enrolled in [facility] for COVID-19. Daily you will receive a questionnaire within the [facility] website. Our COVID-19 response team will be monitoring your responses daily.  You can use medication such as prescription cough medication called Tessalon Perles 100 mg. You may take 1-2 capsules every 8 hours as needed for cough,  prescription inhaler called Albuterol MDI 90 mcg /actuation 2 puffs every 4 hours as needed for shortness of breath, wheezing, cough, and  prescription for Fluticasone nasal spray 2 sprays in each nostril one time per day  You may also take acetaminophen (Tylenol) as needed for fever.  HOME CARE: Only take medications as instructed by your medical team. Drink plenty of fluids and get plenty of rest. A steam or ultrasonic humidifier can help if you have congestion.   GET HELP RIGHT AWAY IF YOU HAVE EMERGENCY WARNING SIGNS.  Call 911 or proceed to your closest emergency facility if: You develop worsening high fever. Trouble breathing Bluish lips or face Persistent pain or pressure in the chest New confusion Inability to wake or stay awake You cough up blood. Your symptoms become more severe Inability to hold down food or fluids  This list is not all possible symptoms. Contact your medical provider for any symptoms that are severe or concerning to you.    Your e-visit answers were reviewed by a board certified advanced clinical practitioner to complete your personal care plan.  Depending on the condition, your plan could have included both over the counter or prescription medications.  If there is a problem please reply once you have received a response from your provider.  Your safety is important to [place].  If you have drug allergies check your prescription carefully.    You can use MyChart to ask questions about today's visit, request a non-urgent call back, or ask for a work or school excuse for 24 hours related to this e-Visit. If it has  been greater than 24 hours you will need to follow up with your provider, or enter a new e-Visit to address those concerns. You will get an e-mail in the next two days asking about your experience.  I hope that your e-visit has been valuable and will speed your recovery. Thank you for using e-visits.    Approximately 5 minutes was spent documenting and reviewing patient's chart.

## 2020-12-22 ENCOUNTER — Encounter: Payer: Self-pay | Admitting: Medical-Surgical

## 2020-12-22 NOTE — Telephone Encounter (Signed)
I think Dr. Madilyn Fireman is covering [place] today, not me.

## 2020-12-23 ENCOUNTER — Encounter: Payer: Self-pay | Admitting: Family Medicine

## 2020-12-23 ENCOUNTER — Other Ambulatory Visit (HOSPITAL_BASED_OUTPATIENT_CLINIC_OR_DEPARTMENT_OTHER): Payer: Self-pay

## 2020-12-23 ENCOUNTER — Telehealth (INDEPENDENT_AMBULATORY_CARE_PROVIDER_SITE_OTHER): Payer: No Typology Code available for payment source | Admitting: Family Medicine

## 2020-12-23 VITALS — BP 141/96 | HR 103 | Wt 191.4 lb

## 2020-12-23 DIAGNOSIS — U071 COVID-19: Secondary | ICD-10-CM | POA: Diagnosis not present

## 2020-12-23 MED ORDER — PREDNISONE 20 MG PO TABS
40.0000 mg | ORAL_TABLET | Freq: Every day | ORAL | 0 refills | Status: DC
  Filled 2020-12-23: qty 10, 5d supply, fill #0

## 2020-12-23 MED ORDER — HYDROCODONE BIT-HOMATROP MBR 5-1.5 MG/5ML PO SOLN
5.0000 mL | Freq: Three times a day (TID) | ORAL | 0 refills | Status: DC | PRN
  Filled 2020-12-23: qty 60, 4d supply, fill #0

## 2020-12-23 NOTE — Progress Notes (Signed)
Virtual Visit via Video Note  I connected with Tracey Patterson on 12/23/20 at  4:20 PM EDT by a video enabled telemedicine application and verified that I am speaking with the correct person using two identifiers.   I discussed the limitations of evaluation and management by telemedicine and the availability of in person appointments. The patient expressed understanding and agreed to proceed.  Patient location: at home Provider location: in office  Subjective:    CC: COVID- 19  HPI:  Hx of Asthma.  Pt tested positive 2 days ago. Her sxs began last week.  she was on vacation. She stated that body aches, fever, congestion,cough, sob,fatigue. No diarrhea. Last fever was Sunday. She did an e- visit on Sunday and was given albuterol and Tessalon Perles.  She does feel like the albuterol's been helpful Pulse ox. 59-98%, lowest was 89%.  When it dropped to 89% she was able to use her albuterol and it did respond and come back up.   Past medical history, Surgical history, Family history not pertinant except as noted below, Social history, Allergies, and medications have been entered into the medical record, reviewed, and corrections made.    Objective:    General: Speaking clearly in complete sentences without any shortness of breath.  Alert and oriented x3.  Normal judgment. No apparent acute distress.    Impression and Recommendations:    No problem-specific Assessment & Plan notes found for this encounter.  COVID 19 -she is ultimately low risk and out of the window for antiviral.  She does have underlying asthma and we did discuss maybe a trial of steroids since she has had some drops in her oxygen level but does seem to be responding well to her albuterol also sent over a stronger cough medicine and she feels that [employer] are really not helping.  If not improving by the end of the week or if she suddenly feels worse please let us know if her oxygen level drops below 90% and does  not come up after albuterol then please go to the emergency room.  BP was little bit elevated today she says its not unusual for her when she is sick normally when she is not sick it looks good.  Did encourage her to keep an eye on that  Meds ordered this encounter  Medications   HYDROcodone bit-homatropine (HYCODAN) 5-1.5 MG/5ML syrup    Sig: Take 5 mLs by mouth every 8 (eight) hours as needed for cough.    Dispense:  60 mL    Refill:  0   predniSONE (DELTASONE) 20 MG tablet    Sig: Take 2 tablets (40 mg total) by mouth daily with breakfast.    Dispense:  10 tablet    Refill:  0     No orders of the defined types were placed in this encounter.   Meds ordered this encounter  Medications   HYDROcodone bit-homatropine (HYCODAN) 5-1.5 MG/5ML syrup    Sig: Take 5 mLs by mouth every 8 (eight) hours as needed for cough.    Dispense:  60 mL    Refill:  0   predniSONE (DELTASONE) 20 MG tablet    Sig: Take 2 tablets (40 mg total) by mouth daily with breakfast.    Dispense:  10 tablet    Refill:  0     I discussed the assessment and treatment plan with the patient. The patient was provided an opportunity to ask questions and all were answered. The patient agreed  with the plan and demonstrated an understanding of the instructions.   The patient was advised to call back or seek an in-person evaluation if the symptoms worsen or if the condition fails to improve as anticipated.   Beatrice Lecher, MD

## 2020-12-23 NOTE — Progress Notes (Signed)
Pt tested positive yesterday. Her sxs began last week.  She stated that body,fever,congestion,cough,sob,fatigue.

## 2020-12-25 ENCOUNTER — Telehealth: Payer: No Typology Code available for payment source | Admitting: Family Medicine

## 2020-12-29 ENCOUNTER — Other Ambulatory Visit (HOSPITAL_BASED_OUTPATIENT_CLINIC_OR_DEPARTMENT_OTHER): Payer: Self-pay

## 2020-12-30 ENCOUNTER — Other Ambulatory Visit (HOSPITAL_BASED_OUTPATIENT_CLINIC_OR_DEPARTMENT_OTHER): Payer: Self-pay

## 2020-12-30 MED FILL — Bupropion HCl Tab ER 24HR 300 MG: ORAL | 90 days supply | Qty: 90 | Fill #1 | Status: AC

## 2021-01-01 ENCOUNTER — Encounter: Payer: Self-pay | Admitting: Medical-Surgical

## 2021-01-04 ENCOUNTER — Emergency Department
Admission: RE | Admit: 2021-01-04 | Discharge: 2021-01-04 | Disposition: A | Discharge status: Home / Self Care | Payer: No Typology Code available for payment source | Source: Ambulatory Visit

## 2021-01-04 ENCOUNTER — Other Ambulatory Visit: Payer: Self-pay

## 2021-01-04 VITALS — BP 134/87 | HR 113 | Temp 98.5°F | Resp 24 | Ht 65.5 in | Wt 200.0 lb

## 2021-01-04 DIAGNOSIS — R051 Acute cough: Secondary | ICD-10-CM | POA: Diagnosis not present

## 2021-01-04 DIAGNOSIS — R42 Dizziness and giddiness: Secondary | ICD-10-CM

## 2021-01-04 MED ORDER — CLARITHROMYCIN 500 MG PO TABS: 500.0000 mg | ORAL_TABLET | Freq: Two times a day (BID) | ORAL | 0 refills | Status: DC

## 2021-01-04 MED ORDER — PREDNISONE 20 MG PO TABS: 20.0000 mg | ORAL_TABLET | Freq: Two times a day (BID) | ORAL | 0 refills | Status: DC

## 2021-01-04 MED ORDER — BENZONATATE 200 MG PO CAPS: 200.0000 mg | ORAL_CAPSULE | Freq: Three times a day (TID) | ORAL | 0 refills | Status: DC | PRN

## 2021-01-04 MED ORDER — HYDROCODONE BIT-HOMATROP MBR 5-1.5 MG/5ML PO SOLN: 5.0000 mL | Freq: Three times a day (TID) | ORAL | 0 refills | Status: DC | PRN

## 2021-01-04 NOTE — ED Triage Notes (Signed)
Pt presents to Urgent Care with c/o productive cough, sob, fatigue, and dizziness x 3 days. Reports being dx w/ COVID on 12/16/20; states she was feeling better until 3 days ago.

## 2021-01-04 NOTE — ED Provider Notes (Signed)
[facility]    CSN: [id number] Arrival date & time: 01/04/21  1301      History   Chief Complaint Chief Complaint  Patient presents with   Cough   Shortness of Breath   Dizziness    HPI Tracey Patterson is a 40 y.o. female.   HPI  Patient states that she developed COVID on or about 12/16/2020.  She was out of town.  When she got back into town on 9/25 she had a COVID test that was positive.  She had a telephone E visit.  She got medical advice regarding management of the COVID.  She states that she got completely better from [place] except for some mild fatigue.  3 days later, she developed a very harsh cough again.  Currently she has a productive cough, shortness of breath, fatigue, and dizziness for 3 days.  She states that she is not having a spinning sensation like vertigo, she just feels "like I am on a cruise ship" and an off-balance sort of way.  No orthostatic changes.  Past Medical History:  Diagnosis Date   Anxiety    Arthritis    Asthma    Chronic headache    COVID-19 11/2020   Depression    Fibromyalgia    GERD (gastroesophageal reflux disease)    History of TMJ disorder    IBS (irritable bowel syndrome)    Obesity    Pneumonia     Patient Active Problem List   Diagnosis Date Noted   Asthma exacerbation 02/07/2020   Constipation 07/04/2019   Abdominal pain 07/04/2019   ANA positive 12/30/2017   Fibromyalgia 11/03/2017   Colitis 06/17/2017   Left ovarian cyst 06/17/2017   Left shoulder pain 09/07/2016   Degenerative tear of acetabular labrum of left hip 07/15/2016   Ganglion cyst 02/17/2016   Generalized anxiety disorder 02/17/2016   Recurrent sinusitis 02/17/2016   Vitamin D deficiency 02/15/2014   Asthma, chronic 12/14/2012   GERD (gastroesophageal reflux disease) 12/14/2012   Seasonal allergies 12/14/2012   Anxiety and depression 12/14/2012   Transaminitis 12/14/2012   Myalgia and myositis 10/30/2012   Other malaise and fatigue  10/26/2012   Flushing 10/26/2012   Insomnia 08/13/2012   Stress and adjustment reaction 08/13/2012    Past Surgical History:  Procedure Laterality Date   BACK SURGERY  December 2015   KNEE ARTHROSCOPY  1998   left   LAPAROSCOPIC TUBAL LIGATION  02/28/2012   Procedure: LAPAROSCOPIC TUBAL LIGATION;  Surgeon: Cyril Mourning, MD;  Location: [facility];  Service: Gynecology;  Laterality: Bilateral;   TONSILLECTOMY      OB History   No obstetric history on file.      Home Medications    Prior to Admission medications   Medication Sig Start Date End Date Taking? Authorizing Provider  benzonatate (TESSALON) 200 MG capsule Take 1 capsule (200 mg total) by mouth 3 (three) times daily as needed for cough. 01/04/21  Yes Raylene Everts, MD  clarithromycin (BIAXIN) 500 MG tablet Take 1 tablet (500 mg total) by mouth 2 (two) times daily. 01/04/21  Yes Raylene Everts, MD  dextromethorphan-guaiFENesin [facility] DM) 30-600 MG 12hr tablet Take 1 tablet by mouth 2 (two) times daily.   Yes [provider]  predniSONE (DELTASONE) 20 MG tablet Take 1 tablet (20 mg total) by mouth 2 (two) times daily with a meal. 01/04/21  Yes Raylene Everts, MD  Pseudoeph-Doxylamine-DM-APAP (DAYQUIL/NYQUIL COLD/FLU RELIEF PO) Take by mouth.  Yes [provider]  albuterol (VENTOLIN HFA) 108 (90 Base) MCG/ACT inhaler Inhale 2 puffs into the lungs every 6 (six) hours as needed for wheezing or shortness of breath. 12/21/20   Sharion Balloon, FNP  baclofen (LIORESAL) 10 MG tablet TAKE 1-2 TABLETS (10-20 MG TOTAL) BY MOUTH 3 (THREE) TIMES DAILY AS NEEDED FOR MUSCLE SPASMS. 06/17/20 06/17/21  Gregor Hams, MD  buPROPion (WELLBUTRIN XL) 300 MG 24 hr tablet TAKE 1 TABLET (300 MG TOTAL) BY MOUTH DAILY. 04/22/20 04/22/21  [name], NP  cetirizine (ZYRTEC) 10 MG tablet Take 1 tablet (10 mg total) by mouth daily. 09/20/13   Hommel, Hilliard Clark, DO  clonazePAM (KLONOPIN) 0.5 MG tablet Take 0.5-1 tablets (0.25-0.5 mg  total) by mouth 2 (two) times daily as needed for anxiety. 09/18/19 12/17/19  Emeterio Reeve, DO  DULoxetine (CYMBALTA) 30 MG capsule Take 3 capsules (90 mg total) by mouth at bedtime. 07/10/20 01/19/21  [name], NP  fluticasone (FLONASE) 50 MCG/ACT nasal spray Place 2 sprays into both nostrils daily. 12/21/20   Sharion Balloon, FNP  gabapentin (NEURONTIN) 300 MG capsule Take 1 capsule (300 mg total) by mouth 3 (three) times daily as needed (nerve pain). 09/26/19   Gregor Hams, MD  HYDROcodone bit-homatropine (HYCODAN) 5-1.5 MG/5ML syrup Take 5 mLs by mouth every 8 (eight) hours as needed for cough. 01/04/21   Raylene Everts, MD  IBUPROFEN PO Take 800 mg by mouth as needed.     [provider]  Multiple Vitamin tablet Take 1 tablet by mouth daily.    [provider]  pantoprazole (PROTONIX) 40 MG tablet TAKE 1 TABLET (40 MG TOTAL) BY MOUTH 2 (TWO) TIMES DAILY. 04/22/20 04/22/21  [name], NP  QUEtiapine (SEROQUEL) 25 MG tablet Take 1-2 tablets (25-50 mg total) by mouth at bedtime. 07/10/20 02/08/21  [name], NP  Sodium Fluoride 1.1 % PSTE USE AS DIRECTED 05/15/20 05/15/21    amitriptyline (ELAVIL) 75 MG tablet Take 1 tablet (75 mg total) by mouth at bedtime as needed for sleep (pain). 04/22/20 05/12/20  [name], NP    Family History Family History  Problem Relation Age of Onset   Hypertension Mother    Depression Mother    Hypertension Father    Breast cancer Maternal Aunt    Prostate cancer Maternal Uncle    Prostate cancer Maternal Uncle    Colon cancer Maternal Uncle    Esophageal cancer Neg Hx     Social History Social History   Tobacco Use   Smoking status: Never   Smokeless tobacco: Never  Vaping Use   Vaping Use: Never used  Substance Use Topics   Alcohol use: Not Currently   Drug use: No     Allergies   Crab [shellfish allergy], Sulfa antibiotics, Zoloft [sertraline hcl], and Other   Review of Systems Review of Systems See  HPI  Physical Exam Triage Vital Signs ED Triage Vitals  Enc Vitals Group     BP 01/04/21 1334 134/87     Pulse Rate 01/04/21 1334 (!) 113     Resp 01/04/21 1334 (!) 24     Temp 01/04/21 1334 98.5 F (36.9 C)     Temp Source 01/04/21 1334 Oral     SpO2 01/04/21 1334 97 %     Weight 01/04/21 1329 200 lb (90.7 kg)     Height 01/04/21 1329 5' 5.5" (1.664 m)     Head Circumference --      Peak  Flow --      Pain Score 01/04/21 1329 0     Pain Loc --      Pain Edu? --      Excl. in [place]? --    No data found.  Updated Vital Signs BP 134/87 (BP Location: Right Arm)   Pulse (!) 113   Temp 98.5 F (36.9 C) (Oral)   Resp (!) 24   Ht 5' 5.5" (1.664 m)   Wt 90.7 kg   SpO2 97%   BMI 32.78 kg/m      Physical Exam Constitutional:      General: She is not in acute distress.    Appearance: She is well-developed. She is ill-appearing.  HENT:     Head: Normocephalic and atraumatic.     Right Ear: Tympanic membrane and ear canal normal.     Left Ear: Tympanic membrane and ear canal normal.     Nose: Nose normal. No rhinorrhea.     Mouth/Throat:     Mouth: Mucous membranes are moist.     Pharynx: Posterior oropharyngeal erythema present.     Comments: Erythema of posterior pharynx Eyes:     Conjunctiva/sclera: Conjunctivae normal.     Pupils: Pupils are equal, round, and reactive to light.  Cardiovascular:     Rate and Rhythm: Normal rate and regular rhythm.     Heart sounds: Normal heart sounds.  Pulmonary:     Effort: Pulmonary effort is normal. No respiratory distress.     Breath sounds: Normal breath sounds. No wheezing or rales.     Comments: Harsh barking cough Abdominal:     General: There is no distension.     Palpations: Abdomen is soft.  Musculoskeletal:        General: Normal range of motion.     Cervical back: Normal range of motion and neck supple.  Lymphadenopathy:     Cervical: Cervical adenopathy present.  Skin:    General: Skin is warm and dry.   Neurological:     Mental Status: She is alert.  Psychiatric:        Mood and Affect: Mood normal.        Behavior: Behavior normal.     UC Treatments / Results  Labs (all labs ordered are listed, but only abnormal results are displayed) Labs Reviewed  COVID-19, FLU A+B AND RSV    EKG   Radiology No results found.  Procedures Procedures (including critical care time)  Medications Ordered in UC Medications - No data to display  Initial Impression / Assessment and Plan / UC Course  I have reviewed the triage vital signs and the nursing notes.  Pertinent labs & imaging results that were available during my care of the patient were reviewed by me and considered in my medical decision making (see chart for details).     Patient has a history of asthma.  She has been using her inhalers.  She is not wheezing today.  We will test for other superimposed viral infections.  Antibiotics and cough medicines prescribed.  Follow-up with primary care Final Clinical Impressions(s) / UC Diagnoses   Final diagnoses:  Acute cough  Lightheadedness     Discharge Instructions      Continue to drink lots of fluids Run the humidifier in your bedroom Take Biaxin 2 times a day with food.  This is an antibiotic For cough you may take Tessalon 2-3 times a day.  Continue a dextromethorphan (DM) product I did refill hydrocodone  cough syrup for bedtime use only Continue your inhalers Am going to repeat the prednisone course Check MyChart for your test results   ED Prescriptions     Medication Sig Dispense Auth. Provider   HYDROcodone bit-homatropine (HYCODAN) 5-1.5 MG/5ML syrup Take 5 mLs by mouth every 8 (eight) hours as needed for cough. 60 mL Raylene Everts, MD   clarithromycin (BIAXIN) 500 MG tablet Take 1 tablet (500 mg total) by mouth 2 (two) times daily. 14 tablet Raylene Everts, MD   predniSONE (DELTASONE) 20 MG tablet Take 1 tablet (20 mg total) by mouth 2 (two) times  daily with a meal. 10 tablet Raylene Everts, MD   benzonatate (TESSALON) 200 MG capsule Take 1 capsule (200 mg total) by mouth 3 (three) times daily as needed for cough. 30 capsule Raylene Everts, MD      I have reviewed the PDMP during this encounter.   Raylene Everts, MD 01/04/21 1426

## 2021-01-04 NOTE — Discharge Instructions (Addendum)
Continue to drink lots of fluids Run the humidifier in your bedroom Take Biaxin 2 times a day with food.  This is an antibiotic For cough you may take Tessalon 2-3 times a day.  Continue a dextromethorphan (DM) product I did refill hydrocodone cough syrup for bedtime use only Continue your inhalers Am going to repeat the prednisone course Check MyChart for your test results

## 2021-01-06 ENCOUNTER — Ambulatory Visit: Payer: No Typology Code available for payment source | Admitting: Internal Medicine

## 2021-01-06 LAB — COVID-19, FLU A+B AND RSV
Influenza A, NAA: NOT DETECTED
Influenza B, NAA: NOT DETECTED
RSV, NAA: NOT DETECTED
SARS-CoV-2, NAA: NOT DETECTED

## 2021-01-08 ENCOUNTER — Encounter: Payer: Self-pay | Admitting: Medical-Surgical

## 2021-01-11 ENCOUNTER — Other Ambulatory Visit: Payer: Self-pay | Admitting: Medical-Surgical

## 2021-01-12 ENCOUNTER — Other Ambulatory Visit (HOSPITAL_BASED_OUTPATIENT_CLINIC_OR_DEPARTMENT_OTHER): Payer: Self-pay

## 2021-01-12 MED ORDER — DULOXETINE HCL 30 MG PO CPEP
90.0000 mg | ORAL_CAPSULE | Freq: Every evening | ORAL | 0 refills | Status: DC
  Filled 2021-01-12: qty 90, 30d supply, fill #0

## 2021-01-13 ENCOUNTER — Other Ambulatory Visit (HOSPITAL_BASED_OUTPATIENT_CLINIC_OR_DEPARTMENT_OTHER): Payer: Self-pay

## 2021-01-13 ENCOUNTER — Encounter: Payer: Self-pay | Admitting: Medical-Surgical

## 2021-01-13 MED ORDER — NYSTATIN 100000 UNIT/ML MT SUSP
500000.0000 [IU] | Freq: Four times a day (QID) | OROMUCOSAL | 0 refills | Status: DC
  Filled 2021-01-13: qty 180, 9d supply, fill #0

## 2021-01-16 ENCOUNTER — Ambulatory Visit (INDEPENDENT_AMBULATORY_CARE_PROVIDER_SITE_OTHER): Payer: No Typology Code available for payment source

## 2021-01-16 ENCOUNTER — Other Ambulatory Visit: Payer: Self-pay

## 2021-01-16 ENCOUNTER — Ambulatory Visit (INDEPENDENT_AMBULATORY_CARE_PROVIDER_SITE_OTHER): Payer: No Typology Code available for payment source | Admitting: Medical-Surgical

## 2021-01-16 ENCOUNTER — Encounter: Payer: Self-pay | Admitting: Medical-Surgical

## 2021-01-16 ENCOUNTER — Other Ambulatory Visit: Payer: Self-pay | Admitting: Medical-Surgical

## 2021-01-16 ENCOUNTER — Ambulatory Visit
Admission: RE | Admit: 2021-01-16 | Discharge: 2021-01-16 | Disposition: A | Discharge status: Home / Self Care | Payer: No Typology Code available for payment source | Source: Ambulatory Visit | Attending: Medical-Surgical | Admitting: Medical-Surgical

## 2021-01-16 ENCOUNTER — Other Ambulatory Visit (HOSPITAL_BASED_OUTPATIENT_CLINIC_OR_DEPARTMENT_OTHER): Payer: Self-pay

## 2021-01-16 VITALS — BP 131/86 | HR 104 | Resp 20 | Ht 65.5 in | Wt 206.0 lb

## 2021-01-16 DIAGNOSIS — J4521 Mild intermittent asthma with (acute) exacerbation: Secondary | ICD-10-CM

## 2021-01-16 DIAGNOSIS — R058 Other specified cough: Secondary | ICD-10-CM

## 2021-01-16 DIAGNOSIS — Z8616 Personal history of COVID-19: Secondary | ICD-10-CM | POA: Diagnosis not present

## 2021-01-16 MED ORDER — AMOXICILLIN-POT CLAVULANATE 875-125 MG PO TABS
1.0000 | ORAL_TABLET | Freq: Two times a day (BID) | ORAL | 0 refills | Status: AC
  Filled 2021-01-16: qty 20, 10d supply, fill #0

## 2021-01-16 MED ORDER — PREDNISONE 10 MG PO TABS
ORAL_TABLET | Freq: Every day | ORAL | 0 refills | Status: DC
Start: 2021-01-16 — End: 2021-07-17
  Filled 2021-01-16: qty 48, 12d supply, fill #0

## 2021-01-16 MED ORDER — ADVAIR HFA 115-21 MCG/ACT IN AERO
2.0000 | INHALATION_SPRAY | Freq: Two times a day (BID) | RESPIRATORY_TRACT | 0 refills | Status: DC
  Filled 2021-01-16: qty 12, 30d supply, fill #0

## 2021-01-16 NOTE — Progress Notes (Signed)
  HPI with pertinent ROS:   CC: harsh cough  HPI: Pleasant 40 year old female presenting for in-person evaluation of continued harsh, paroxysmal coughing. She was diagnosed with COVID approximately one month ago. She completed outpatient treatment with no need for ED evaluation or inpatient care. Since then she has completed 2 short bursts of prednisone as well as a course of Biaxin. She continues to use her Albuterol inhaler up to twice daily for severe SOB which  is helpful. Feels that she is constantly short of breath now. Having difficulty sleeping at times due to coughing. Has been using OTC cold and flu medications. Has also found Hycodan cough syrup helpful. Denies fever, chills, chest pain, and GI symptoms.   I reviewed the past medical history, family history, social history, surgical history, and allergies today and no changes were needed.  Please see the problem list section below in epic for further details.   Physical exam:   General: Well Developed, well nourished, and in no acute distress.  Neuro: Alert and oriented x3.  HEENT: Normocephalic, atraumatic.  Skin: Warm and dry. Cardiac: Regular rate and rhythm, no murmurs rubs or gallops, no lower extremity edema.  Respiratory: Clear to auscultation bilaterally. Not using accessory muscles, speaking in full sentences.  Impression and Recommendations:    1. Post-viral cough syndrome 2. Mild intermittent asthma with exacerbation Chest x-ray today. Prednisone 12 day taper. Augmentin BID x 10 days for complete coverage. Continue Albuterol inhaler prn. Start Advair BID. - DG Chest 2 View; Future  Return if symptoms worsen or fail to improve. ___________________________________________ Clearnce Sorrel, DNP, APRN, FNP-BC [facility] and [place]

## 2021-01-28 ENCOUNTER — Other Ambulatory Visit (HOSPITAL_BASED_OUTPATIENT_CLINIC_OR_DEPARTMENT_OTHER): Payer: Self-pay

## 2021-01-28 ENCOUNTER — Other Ambulatory Visit: Payer: Self-pay | Admitting: Medical-Surgical

## 2021-01-28 DIAGNOSIS — K219 Gastro-esophageal reflux disease without esophagitis: Secondary | ICD-10-CM

## 2021-01-28 MED ORDER — PANTOPRAZOLE SODIUM 40 MG PO TBEC
DELAYED_RELEASE_TABLET | Freq: Two times a day (BID) | ORAL | 0 refills | Status: DC
  Filled 2021-01-28: qty 180, 90d supply, fill #0

## 2021-01-29 ENCOUNTER — Encounter: Payer: Self-pay | Admitting: Medical-Surgical

## 2021-02-03 ENCOUNTER — Other Ambulatory Visit (HOSPITAL_BASED_OUTPATIENT_CLINIC_OR_DEPARTMENT_OTHER): Payer: Self-pay

## 2021-02-03 MED ORDER — GUANFACINE HCL ER 1 MG PO TB24
1.0000 mg | ORAL_TABLET | Freq: Every day | ORAL | 1 refills | Status: DC
  Filled 2021-02-03: qty 30, 30d supply, fill #0
  Filled 2021-03-04: qty 30, 30d supply, fill #1

## 2021-02-08 ENCOUNTER — Other Ambulatory Visit: Payer: Self-pay | Admitting: Medical-Surgical

## 2021-02-09 ENCOUNTER — Other Ambulatory Visit (HOSPITAL_BASED_OUTPATIENT_CLINIC_OR_DEPARTMENT_OTHER): Payer: Self-pay

## 2021-02-09 MED ORDER — QUETIAPINE FUMARATE 25 MG PO TABS
25.0000 mg | ORAL_TABLET | Freq: Every day | ORAL | 1 refills | Status: DC
  Filled 2021-02-09: qty 180, 90d supply, fill #0
  Filled 2021-06-08 – 2021-06-11 (×2): qty 180, 90d supply, fill #1

## 2021-02-10 ENCOUNTER — Other Ambulatory Visit (HOSPITAL_BASED_OUTPATIENT_CLINIC_OR_DEPARTMENT_OTHER): Payer: Self-pay

## 2021-02-15 ENCOUNTER — Other Ambulatory Visit: Payer: Self-pay | Admitting: Medical-Surgical

## 2021-02-16 ENCOUNTER — Other Ambulatory Visit (HOSPITAL_BASED_OUTPATIENT_CLINIC_OR_DEPARTMENT_OTHER): Payer: Self-pay

## 2021-02-16 MED ORDER — DULOXETINE HCL 30 MG PO CPEP
90.0000 mg | ORAL_CAPSULE | Freq: Every evening | ORAL | 0 refills | Status: DC
  Filled 2021-02-16: qty 90, 30d supply, fill #0

## 2021-03-03 ENCOUNTER — Ambulatory Visit: Payer: No Typology Code available for payment source | Admitting: Internal Medicine

## 2021-03-03 NOTE — Progress Notes (Deleted)
Cardiology Office Note:    Date:  03/03/2021   ID:  Tracey Patterson, DOB Sep 06, 1980, MRN [id number]  PCP:  [name], NP   [facility] Providers Cardiologist:  None { Click to update primary MD,subspecialty MD or APP then REFRESH:1}    Referring MD: [name], NP   No chief complaint on file. ***  History of Present Illness:    Tracey Patterson is a 40 y.o. female with a hx below, no cardiac hx, referral for SOB and palpitations  CVD Risk/Equivalent: HLD- *** HTN- *** PAD- *** DMII *** Smoker-*** Premature Family History- ***   Past Medical History:  Diagnosis Date   Anxiety    Arthritis    Asthma    Chronic headache    COVID-19 11/2020   Depression    Fibromyalgia    GERD (gastroesophageal reflux disease)    History of TMJ disorder    IBS (irritable bowel syndrome)    Obesity    Pneumonia     Past Surgical History:  Procedure Laterality Date   BACK SURGERY  December 2015   KNEE ARTHROSCOPY  1998   left   LAPAROSCOPIC TUBAL LIGATION  02/28/2012   Procedure: LAPAROSCOPIC TUBAL LIGATION;  Surgeon: Cyril Mourning, MD;  Location: [facility];  Service: Gynecology;  Laterality: Bilateral;   TONSILLECTOMY      Current Medications: No outpatient medications have been marked as taking for the 03/04/21 encounter (Appointment) with Janina Mayo, MD.     Allergies:   Otho Darner allergy], Sulfa antibiotics, Zoloft [sertraline hcl], and Other   Social History   Socioeconomic History   Marital status: Married    Spouse name: Not on file   Number of children: 3   Years of education: Not on file   Highest education level: Not on file  Occupational History   Not on file  Tobacco Use   Smoking status: Never   Smokeless tobacco: Never  Vaping Use   Vaping Use: Never used  Substance and Sexual Activity   Alcohol use: Not Currently   Drug use: No   Sexual activity: Not on file  Other Topics Concern   Not on file  Social History Narrative    Marital Status: Married Aeronautical engineer)    Children:  Son Air cabin crew) Daughter [name])    Pets: Dogs (1) Cats (2)    Living Situation: Lives with  husband and children    Occupation: Radiology [employer]    Education: Bachelor's Degree in Science    Tobacco Use/Exposure:  None    Alcohol Use:  Occasional   Drug Use:  None   Diet:  Regular   Exercise:  None   Hobbies:  Reading          Social Determinants of Health   Financial Resource Strain: Not on file  Food Insecurity: Not on file  Transportation Needs: Not on file  Physical Activity: Not on file  Stress: Not on file  Social Connections: Not on file     Family History: The patient's family history includes Breast cancer in her maternal aunt; Colon cancer in her maternal uncle; Depression in her mother; Hypertension in her father and mother; Prostate cancer in her maternal uncle and maternal uncle. There is no history of Esophageal cancer.  ROS:   Please see the history of present illness.     All other systems reviewed and are negative.  EKGs/Labs/Other Studies Reviewed:    The following studies were reviewed  today: ***  EKG:  EKG is *** ordered today.  The ekg ordered today demonstrates ***  Recent Labs: 09/25/2020: ALT 21; BUN 8; Creat 0.85; Hemoglobin 12.9; Magnesium 1.9; Platelets 437; Potassium 4.3; Sodium 140; TSH 1.39  Recent Lipid Panel    Component Value Date/Time   CHOL 210 (H) 09/25/2020 1213   TRIG 188 (H) 09/25/2020 1213   HDL 55 09/25/2020 1213   CHOLHDL 3.8 09/25/2020 1213   VLDL 37 (H) 02/26/2016 0840   LDLCALC 125 (H) 09/25/2020 1213     Risk Assessment/Calculations:   {Does this patient have ATRIAL FIBRILLATION?:[phone]}       Physical Exam:    VS:  There were no vitals taken for this visit.    Wt Readings from Last 3 Encounters:  01/16/21 206 lb (93.4 kg)  01/04/21 200 lb (90.7 kg)  12/23/20 191 lb 6.4 oz (86.8 kg)     GEN: *** Well nourished, well developed  in no acute distress HEENT: Normal NECK: No JVD; No carotid bruits LYMPHATICS: No lymphadenopathy CARDIAC: ***RRR, no murmurs, rubs, gallops RESPIRATORY:  Clear to auscultation without rales, wheezing or rhonchi  ABDOMEN: Soft, non-tender, non-distended MUSCULOSKELETAL:  No edema; No deformity  SKIN: Warm and dry NEUROLOGIC:  Alert and oriented x 3 PSYCHIATRIC:  Normal affect   ASSESSMENT:    No diagnosis found. PLAN:    In order of problems listed above:  ***      {Are you ordering a CV Procedure (e.g. stress test, cath, DCCV, TEE, etc)?   Press F2        :[id number]}    Medication Adjustments/Labs and Tests Ordered: Current medicines are reviewed at length with the patient today.  Concerns regarding medicines are outlined above.  No orders of the defined types were placed in this encounter.  No orders of the defined types were placed in this encounter.   There are no Patient Instructions on file for this visit.   Signed, Janina Mayo, MD  03/03/2021 12:33 PM    [facility]

## 2021-03-04 ENCOUNTER — Ambulatory Visit: Payer: No Typology Code available for payment source | Admitting: Internal Medicine

## 2021-03-05 ENCOUNTER — Other Ambulatory Visit (HOSPITAL_BASED_OUTPATIENT_CLINIC_OR_DEPARTMENT_OTHER): Payer: Self-pay

## 2021-03-16 ENCOUNTER — Other Ambulatory Visit (HOSPITAL_BASED_OUTPATIENT_CLINIC_OR_DEPARTMENT_OTHER): Payer: Self-pay

## 2021-03-16 MED ORDER — DULOXETINE HCL 30 MG PO CPEP
90.0000 mg | ORAL_CAPSULE | Freq: Every day | ORAL | 0 refills | Status: DC
  Filled 2021-03-16: qty 90, 30d supply, fill #0

## 2021-03-16 MED ORDER — GUANFACINE HCL ER 2 MG PO TB24
2.0000 mg | ORAL_TABLET | Freq: Every day | ORAL | 3 refills | Status: DC
  Filled 2021-03-16: qty 30, 30d supply, fill #0
  Filled 2021-04-16: qty 30, 30d supply, fill #1
  Filled 2021-05-18: qty 30, 30d supply, fill #2
  Filled 2021-06-08 – 2021-06-15 (×2): qty 30, 30d supply, fill #3

## 2021-03-17 ENCOUNTER — Other Ambulatory Visit (HOSPITAL_BASED_OUTPATIENT_CLINIC_OR_DEPARTMENT_OTHER): Payer: Self-pay

## 2021-03-17 MED FILL — Bupropion HCl Tab ER 24HR 300 MG: ORAL | 90 days supply | Qty: 90 | Fill #2 | Status: AC

## 2021-04-15 ENCOUNTER — Other Ambulatory Visit (HOSPITAL_BASED_OUTPATIENT_CLINIC_OR_DEPARTMENT_OTHER): Payer: Self-pay

## 2021-04-15 MED ORDER — DEXMETHYLPHENIDATE HCL ER 5 MG PO CP24
ORAL_CAPSULE | ORAL | 0 refills | Status: DC
  Filled 2021-04-15: qty 30, 30d supply, fill #0

## 2021-04-16 ENCOUNTER — Other Ambulatory Visit (HOSPITAL_BASED_OUTPATIENT_CLINIC_OR_DEPARTMENT_OTHER): Payer: Self-pay

## 2021-04-16 MED ORDER — DULOXETINE HCL 30 MG PO CPEP
ORAL_CAPSULE | ORAL | 0 refills | Status: DC
  Filled 2021-04-16: qty 90, 30d supply, fill #0

## 2021-04-16 MED ORDER — DULOXETINE HCL 30 MG PO CPEP
ORAL_CAPSULE | ORAL | 5 refills | Status: DC
  Filled 2021-04-16 – 2021-05-18 (×2): qty 90, 30d supply, fill #0

## 2021-04-17 ENCOUNTER — Other Ambulatory Visit (HOSPITAL_BASED_OUTPATIENT_CLINIC_OR_DEPARTMENT_OTHER): Payer: Self-pay

## 2021-04-29 ENCOUNTER — Encounter: Payer: Self-pay | Admitting: Medical-Surgical

## 2021-05-05 ENCOUNTER — Other Ambulatory Visit: Payer: Self-pay | Admitting: Medical-Surgical

## 2021-05-05 ENCOUNTER — Other Ambulatory Visit (HOSPITAL_BASED_OUTPATIENT_CLINIC_OR_DEPARTMENT_OTHER): Payer: Self-pay

## 2021-05-05 MED ORDER — AZELASTINE-FLUTICASONE 137-50 MCG/ACT NA SUSP
1.0000 | Freq: Two times a day (BID) | NASAL | 2 refills | Status: DC
  Filled 2021-05-05: qty 23, 60d supply, fill #0
  Filled 2021-09-07: qty 23, 60d supply, fill #1

## 2021-05-06 ENCOUNTER — Other Ambulatory Visit (HOSPITAL_BASED_OUTPATIENT_CLINIC_OR_DEPARTMENT_OTHER): Payer: Self-pay

## 2021-05-06 MED ORDER — DEXMETHYLPHENIDATE HCL ER 10 MG PO CP24
ORAL_CAPSULE | ORAL | 0 refills | Status: DC
  Filled 2021-05-06: qty 30, 30d supply, fill #0

## 2021-05-07 ENCOUNTER — Other Ambulatory Visit (HOSPITAL_BASED_OUTPATIENT_CLINIC_OR_DEPARTMENT_OTHER): Payer: Self-pay

## 2021-05-08 ENCOUNTER — Other Ambulatory Visit (HOSPITAL_BASED_OUTPATIENT_CLINIC_OR_DEPARTMENT_OTHER): Payer: Self-pay

## 2021-05-11 ENCOUNTER — Other Ambulatory Visit (HOSPITAL_BASED_OUTPATIENT_CLINIC_OR_DEPARTMENT_OTHER): Payer: Self-pay

## 2021-05-12 ENCOUNTER — Other Ambulatory Visit (HOSPITAL_BASED_OUTPATIENT_CLINIC_OR_DEPARTMENT_OTHER): Payer: Self-pay

## 2021-05-13 ENCOUNTER — Other Ambulatory Visit (HOSPITAL_BASED_OUTPATIENT_CLINIC_OR_DEPARTMENT_OTHER): Payer: Self-pay

## 2021-05-14 ENCOUNTER — Telehealth: Payer: Self-pay

## 2021-05-14 ENCOUNTER — Other Ambulatory Visit (HOSPITAL_BASED_OUTPATIENT_CLINIC_OR_DEPARTMENT_OTHER): Payer: Self-pay

## 2021-05-14 NOTE — Telephone Encounter (Addendum)
Initiated Prior authorization DIX:[id number] 137-50MCG/ACT suspension Via: Covermymeds Case/Key:[id number] - PA Case ID: [id number]  Status: approved as of 05/14/21 Reason:Coverage start date 05/15/2021 to 05/14/2022 Notified Pt via: Mychart

## 2021-05-15 ENCOUNTER — Other Ambulatory Visit (HOSPITAL_BASED_OUTPATIENT_CLINIC_OR_DEPARTMENT_OTHER): Payer: Self-pay

## 2021-05-18 ENCOUNTER — Other Ambulatory Visit (HOSPITAL_BASED_OUTPATIENT_CLINIC_OR_DEPARTMENT_OTHER): Payer: Self-pay

## 2021-05-18 ENCOUNTER — Other Ambulatory Visit (HOSPITAL_COMMUNITY): Payer: Self-pay

## 2021-05-18 MED FILL — Baclofen Tab 10 MG: ORAL | 15 days supply | Qty: 90 | Fill #0 | Status: AC

## 2021-05-20 ENCOUNTER — Other Ambulatory Visit (HOSPITAL_BASED_OUTPATIENT_CLINIC_OR_DEPARTMENT_OTHER): Payer: Self-pay

## 2021-06-09 ENCOUNTER — Other Ambulatory Visit (HOSPITAL_COMMUNITY): Payer: Self-pay

## 2021-06-10 ENCOUNTER — Other Ambulatory Visit (HOSPITAL_COMMUNITY): Payer: Self-pay

## 2021-06-11 ENCOUNTER — Other Ambulatory Visit (HOSPITAL_BASED_OUTPATIENT_CLINIC_OR_DEPARTMENT_OTHER): Payer: Self-pay

## 2021-06-11 ENCOUNTER — Other Ambulatory Visit (HOSPITAL_COMMUNITY): Payer: Self-pay

## 2021-06-15 ENCOUNTER — Other Ambulatory Visit (HOSPITAL_COMMUNITY): Payer: Self-pay

## 2021-06-15 ENCOUNTER — Other Ambulatory Visit (HOSPITAL_BASED_OUTPATIENT_CLINIC_OR_DEPARTMENT_OTHER): Payer: Self-pay

## 2021-06-15 MED ORDER — DEXMETHYLPHENIDATE HCL ER 15 MG PO CP24
ORAL_CAPSULE | ORAL | 0 refills | Status: DC
  Filled 2021-06-15: qty 30, 30d supply, fill #0

## 2021-06-21 ENCOUNTER — Encounter: Payer: Self-pay | Admitting: Medical-Surgical

## 2021-06-21 ENCOUNTER — Other Ambulatory Visit: Payer: Self-pay | Admitting: Medical-Surgical

## 2021-06-21 DIAGNOSIS — F411 Generalized anxiety disorder: Secondary | ICD-10-CM

## 2021-06-22 ENCOUNTER — Other Ambulatory Visit (HOSPITAL_BASED_OUTPATIENT_CLINIC_OR_DEPARTMENT_OTHER): Payer: Self-pay

## 2021-06-22 ENCOUNTER — Other Ambulatory Visit: Payer: Self-pay | Admitting: Medical-Surgical

## 2021-06-22 MED ORDER — DULOXETINE HCL 60 MG PO CPEP
60.0000 mg | ORAL_CAPSULE | Freq: Every day | ORAL | 1 refills | Status: DC
  Filled 2021-06-22 – 2021-06-30 (×2): qty 90, 90d supply, fill #0
  Filled 2021-09-17: qty 90, 90d supply, fill #1

## 2021-06-23 ENCOUNTER — Other Ambulatory Visit (HOSPITAL_BASED_OUTPATIENT_CLINIC_OR_DEPARTMENT_OTHER): Payer: Self-pay

## 2021-06-23 MED ORDER — BUPROPION HCL ER (XL) 300 MG PO TB24
300.0000 mg | ORAL_TABLET | Freq: Every day | ORAL | 0 refills | Status: DC
  Filled 2021-06-23 – 2021-06-30 (×2): qty 30, 30d supply, fill #0

## 2021-06-29 ENCOUNTER — Other Ambulatory Visit (HOSPITAL_BASED_OUTPATIENT_CLINIC_OR_DEPARTMENT_OTHER): Payer: Self-pay

## 2021-06-30 ENCOUNTER — Other Ambulatory Visit (HOSPITAL_BASED_OUTPATIENT_CLINIC_OR_DEPARTMENT_OTHER): Payer: Self-pay

## 2021-07-10 ENCOUNTER — Other Ambulatory Visit (HOSPITAL_BASED_OUTPATIENT_CLINIC_OR_DEPARTMENT_OTHER): Payer: Self-pay

## 2021-07-10 MED ORDER — DEXMETHYLPHENIDATE HCL ER 20 MG PO CP24
ORAL_CAPSULE | ORAL | 0 refills | Status: DC
  Filled 2021-07-10: qty 30, 30d supply, fill #0

## 2021-07-10 MED ORDER — GUANFACINE HCL ER 2 MG PO TB24
ORAL_TABLET | ORAL | 3 refills | Status: DC
  Filled 2021-07-10: qty 30, 30d supply, fill #0
  Filled 2021-08-19: qty 30, 30d supply, fill #1
  Filled 2021-09-17: qty 30, 30d supply, fill #2
  Filled 2021-10-20 – 2021-10-22 (×2): qty 30, 30d supply, fill #3

## 2021-07-13 ENCOUNTER — Other Ambulatory Visit (HOSPITAL_BASED_OUTPATIENT_CLINIC_OR_DEPARTMENT_OTHER): Payer: Self-pay

## 2021-07-14 ENCOUNTER — Other Ambulatory Visit (HOSPITAL_BASED_OUTPATIENT_CLINIC_OR_DEPARTMENT_OTHER): Payer: Self-pay

## 2021-07-17 ENCOUNTER — Other Ambulatory Visit (HOSPITAL_BASED_OUTPATIENT_CLINIC_OR_DEPARTMENT_OTHER): Payer: Self-pay

## 2021-07-17 ENCOUNTER — Ambulatory Visit (INDEPENDENT_AMBULATORY_CARE_PROVIDER_SITE_OTHER): Payer: No Typology Code available for payment source | Admitting: Medical-Surgical

## 2021-07-17 ENCOUNTER — Encounter: Payer: Self-pay | Admitting: Medical-Surgical

## 2021-07-17 VITALS — BP 113/76 | HR 82 | Resp 20 | Ht 65.5 in | Wt 211.8 lb

## 2021-07-17 DIAGNOSIS — M797 Fibromyalgia: Secondary | ICD-10-CM | POA: Diagnosis not present

## 2021-07-17 DIAGNOSIS — F419 Anxiety disorder, unspecified: Secondary | ICD-10-CM | POA: Diagnosis not present

## 2021-07-17 DIAGNOSIS — F32A Depression, unspecified: Secondary | ICD-10-CM

## 2021-07-17 DIAGNOSIS — F411 Generalized anxiety disorder: Secondary | ICD-10-CM | POA: Diagnosis not present

## 2021-07-17 MED ORDER — DULOXETINE HCL 30 MG PO CPEP
30.0000 mg | ORAL_CAPSULE | Freq: Every day | ORAL | 3 refills | Status: DC
  Filled 2021-07-17: qty 90, 90d supply, fill #0
  Filled 2021-10-15: qty 90, 90d supply, fill #1
  Filled 2022-01-06: qty 90, 90d supply, fill #2
  Filled 2022-04-14: qty 90, 90d supply, fill #3

## 2021-07-17 MED ORDER — BUPROPION HCL ER (XL) 300 MG PO TB24
300.0000 mg | ORAL_TABLET | Freq: Every day | ORAL | 3 refills | Status: DC
  Filled 2021-07-17 – 2021-07-26 (×3): qty 90, 90d supply, fill #0
  Filled 2021-10-25: qty 90, 90d supply, fill #1
  Filled 2022-01-21: qty 90, 90d supply, fill #2
  Filled 2022-04-14: qty 90, 90d supply, fill #3

## 2021-07-17 MED ORDER — CLONAZEPAM 0.5 MG PO TABS
0.2500 mg | ORAL_TABLET | Freq: Two times a day (BID) | ORAL | 0 refills | Status: DC | PRN
  Filled 2021-07-17: qty 30, 15d supply, fill #0

## 2021-07-17 NOTE — Progress Notes (Signed)
?  HPI with pertinent ROS:  ? ?CC: med refills, fibromyalgia ? ?HPI: ?Pleasant 41 year old female presenting today for medication refills and a fibromyalgia flare.  ? ?Mood- taking Cymbalta 29m nightly. Had previously tried 96mnightly but it was leaving her groggy in the mornings. She is also taking Wellbutrin 30067maily. Tolerating both medications well without side effects. Using Seroquel 13m36mghtly to help with sleep. Uses Klonopin 0.25-0.5mg 29my infrequently but is requesting a refill since her last refill was in 2021. Denies SI/HI. ? ?Fibromyalgia- feels that she is in a flare and everything hurts lately. Has no energy. Is not currently exercising at all. She has gained about 20lbs in the last 6 months. Has Gabapentin 300mg 33mprn at home but has not been taking it.  ? ?I reviewed the past medical history, family history, social history, surgical history, and allergies today and no changes were needed.  Please see the problem list section below in epic for further details. ? ? ?Physical exam:  ? ?General: Well Developed, well nourished, and in no acute distress.  ?Neuro: Alert and oriented x3.  ?HEENT: Normocephalic, atraumatic.  ?Skin: Warm and dry. ?Cardiac: Regular rate and rhythm.  ?Respiratory: Not using accessory muscles, speaking in full sentences. ? ?Impression and Recommendations:   ? ?1. Anxiety and depression ?Continue very sparing use of Klonopin, refilled.  ?- clonazePAM (KLONOPIN) 0.5 MG tablet; Take 1/2 - 1 tablet (0.25-0.5 mg total) by mouth 2 (two) times daily as needed for anxiety.  Dispense: 30 tablet; Refill: 0 ? ?2. Generalized anxiety disorder ?Continue Wellbutrin 300mg d60m, refilled.  ?- buPROPion (WELLBUTRIN XL) 300 MG 24 hr tablet; Take 1 tablet (300 mg total) by mouth daily.  Dispense: 90 tablet; Refill: 3 ? ?3. Fibromyalgia ?Reviewed recommendations for management of fibromyalgia including weight management and regular intentional exercising. Adding 30mg Cy43mta in the  morning, continue Cymbalta 60mg at 65mt. Ok to use OTC oral and topical analgesics as needed. ? ?Return for annual physical exam at your convenience. ?___________________________________________ ?Sumit Branham L. JeClearnce SorrelRN, FNP-BC ?Primary Care and Sports Medicine ?[facility]

## 2021-07-19 ENCOUNTER — Other Ambulatory Visit: Payer: Self-pay | Admitting: Family Medicine

## 2021-07-19 DIAGNOSIS — M5416 Radiculopathy, lumbar region: Secondary | ICD-10-CM

## 2021-07-19 DIAGNOSIS — S39012A Strain of muscle, fascia and tendon of lower back, initial encounter: Secondary | ICD-10-CM

## 2021-07-19 DIAGNOSIS — M25551 Pain in right hip: Secondary | ICD-10-CM

## 2021-07-20 ENCOUNTER — Other Ambulatory Visit (HOSPITAL_BASED_OUTPATIENT_CLINIC_OR_DEPARTMENT_OTHER): Payer: Self-pay

## 2021-07-20 MED ORDER — GABAPENTIN 300 MG PO CAPS
300.0000 mg | ORAL_CAPSULE | Freq: Three times a day (TID) | ORAL | 1 refills | Status: DC | PRN
  Filled 2021-07-20: qty 90, 30d supply, fill #0

## 2021-07-22 ENCOUNTER — Other Ambulatory Visit (HOSPITAL_BASED_OUTPATIENT_CLINIC_OR_DEPARTMENT_OTHER): Payer: Self-pay

## 2021-07-26 ENCOUNTER — Other Ambulatory Visit: Payer: Self-pay | Admitting: Medical-Surgical

## 2021-07-26 DIAGNOSIS — K219 Gastro-esophageal reflux disease without esophagitis: Secondary | ICD-10-CM

## 2021-07-27 ENCOUNTER — Other Ambulatory Visit (HOSPITAL_BASED_OUTPATIENT_CLINIC_OR_DEPARTMENT_OTHER): Payer: Self-pay

## 2021-07-27 ENCOUNTER — Encounter: Payer: Self-pay | Admitting: Medical-Surgical

## 2021-07-27 ENCOUNTER — Telehealth: Payer: No Typology Code available for payment source | Admitting: Nurse Practitioner

## 2021-07-27 DIAGNOSIS — J40 Bronchitis, not specified as acute or chronic: Secondary | ICD-10-CM | POA: Diagnosis not present

## 2021-07-27 MED ORDER — PANTOPRAZOLE SODIUM 40 MG PO TBEC
DELAYED_RELEASE_TABLET | Freq: Two times a day (BID) | ORAL | 0 refills | Status: DC
  Filled 2021-07-27: qty 180, 90d supply, fill #0

## 2021-07-27 MED ORDER — AZITHROMYCIN 250 MG PO TABS
ORAL_TABLET | ORAL | 0 refills | Status: AC
  Filled 2021-07-27: qty 6, 5d supply, fill #0

## 2021-07-27 MED ORDER — PREDNISONE 20 MG PO TABS
20.0000 mg | ORAL_TABLET | Freq: Two times a day (BID) | ORAL | 0 refills | Status: AC
Start: 2021-07-27 — End: 2021-08-01
  Filled 2021-07-27: qty 10, 5d supply, fill #0

## 2021-07-27 MED ORDER — BENZONATATE 100 MG PO CAPS
100.0000 mg | ORAL_CAPSULE | Freq: Three times a day (TID) | ORAL | 0 refills | Status: DC | PRN
Start: 2021-07-27 — End: 2021-10-14
  Filled 2021-07-27: qty 30, 10d supply, fill #0

## 2021-07-27 NOTE — Progress Notes (Signed)
We are sorry that you are not feeling well.  Here is how we plan to help! ? ?Based on your presentation I believe you most likely have A cough due to bacteria.  When patients have a fever and a productive cough with a change in color or increased sputum production, we are concerned about bacterial bronchitis.  If left untreated it can progress to pneumonia.  If your symptoms do not improve with your treatment plan it is important that you contact your provider.   I have prescribed Azithromyin 250 mg: two tablets now and then one tablet daily for 4 additonal days  ?  ?In addition you may use A prescription cough medication called Tessalon Perles 165m. You may take 1-2 capsules every 8 hours as needed for your cough. ? ?Prednisone 20 mg twice daily for 5 days (please take with food ) ? ?From your responses in the eVisit questionnaire you describe inflammation in the upper respiratory tract which is causing a significant cough.  This is commonly called Bronchitis and has four common causes:   ?Allergies ?Viral Infections ?Acid Reflux ?Bacterial Infection ?Allergies, viruses and acid reflux are treated by controlling symptoms or eliminating the cause. An example might be a cough caused by taking certain blood pressure medications. You stop the cough by changing the medication. Another example might be a cough caused by acid reflux. Controlling the reflux helps control the cough. ? ?USE OF BRONCHODILATOR ("RESCUE") INHALERS: ?There is a risk from using your bronchodilator too frequently.  The risk is that over-reliance on a medication which only relaxes the muscles surrounding the breathing tubes can reduce the effectiveness of medications prescribed to reduce swelling and congestion of the tubes themselves.  Although you feel brief relief from the bronchodilator inhaler, your asthma may actually be worsening with the tubes becoming more swollen and filled with mucus.  This can delay other crucial treatments, such as  oral steroid medications. If you need to use a bronchodilator inhaler daily, several times per day, you should discuss this with your provider.  There are probably better treatments that could be used to keep your asthma under control.  ?   ?HOME CARE ?Only take medications as instructed by your medical team. ?Complete the entire course of an antibiotic. ?Drink plenty of fluids and get plenty of rest. ?Avoid close contacts especially the very young and the elderly ?Cover your mouth if you cough or cough into your sleeve. ?Always remember to wash your hands ?A steam or ultrasonic humidifier can help congestion.  ? ?GET HELP RIGHT AWAY IF: ?You develop worsening fever. ?You become short of breath ?You cough up blood. ?Your symptoms persist after you have completed your treatment plan ?MAKE SURE YOU  ?Understand these instructions. ?Will watch your condition. ?Will get help right away if you are not doing well or get worse. ?  ? ?Thank you for choosing an e-visit. ? ?Your e-visit answers were reviewed by a board certified advanced clinical practitioner to complete your personal care plan. Depending upon the condition, your plan could have included both over the counter or prescription medications. ? ?Please review your pharmacy choice. Make sure the pharmacy is open so you can pick up prescription now. If there is a problem, you may contact your provider through [employer] have the prescription routed to another pharmacy.  Your safety is important to [place] If you have drug allergies check your prescription carefully.  ? ?For the next 24 hours you can use MyChart  to ask questions about today's visit, request a non-urgent call back, or ask for a work or school excuse. ?You will get an email in the next two days asking about your experience. I hope that your e-visit has been valuable and will speed your recovery.  ? ?I spent approximately 7 minutes reviewing the patient's history, current symptoms and  coordinating their plan of care today.   ? ?Meds ordered this encounter  ?Medications  ? azithromycin (ZITHROMAX) 250 MG tablet  ?  Sig: Take 2 tablets on day 1, then 1 tablet daily on days 2 through 5  ?  Dispense:  6 tablet  ?  Refill:  0  ? benzonatate (TESSALON) 100 MG capsule  ?  Sig: Take 1 capsule (100 mg total) by mouth 3 (three) times daily as needed.  ?  Dispense:  30 capsule  ?  Refill:  0  ? predniSONE (DELTASONE) 20 MG tablet  ?  Sig: Take 1 tablet (20 mg total) by mouth 2 (two) times daily with a meal for 5 days.  ?  Dispense:  10 tablet  ?  Refill:  0  ?  ?

## 2021-08-01 ENCOUNTER — Emergency Department
Admission: RE | Admit: 2021-08-01 | Discharge: 2021-08-01 | Disposition: A | Discharge status: Home / Self Care | Payer: No Typology Code available for payment source | Source: Ambulatory Visit

## 2021-08-01 ENCOUNTER — Emergency Department (INDEPENDENT_AMBULATORY_CARE_PROVIDER_SITE_OTHER): Payer: No Typology Code available for payment source

## 2021-08-01 VITALS — BP 134/93 | HR 107 | Temp 98.3°F | Resp 18 | Ht 65.5 in | Wt 205.0 lb

## 2021-08-01 DIAGNOSIS — M25572 Pain in left ankle and joints of left foot: Secondary | ICD-10-CM | POA: Diagnosis not present

## 2021-08-01 DIAGNOSIS — S99922A Unspecified injury of left foot, initial encounter: Secondary | ICD-10-CM

## 2021-08-01 DIAGNOSIS — M79672 Pain in left foot: Secondary | ICD-10-CM

## 2021-08-01 MED ORDER — TRAMADOL HCL 50 MG PO TABS: 50.0000 mg | ORAL_TABLET | Freq: Every day | ORAL | 0 refills | Status: DC | PRN

## 2021-08-01 NOTE — ED Triage Notes (Signed)
Pt states that she fell and injured her left ankle. X2 days ?

## 2021-08-01 NOTE — Discharge Instructions (Addendum)
Advised/informed patient of left foot and left ankle x-rays with hard copy provided to the patient.  Advised patient to follow-up with [place] orthopedic provider first thing on Monday morning, 08/03/2021 for further evaluation of possible lateral malleolus avulsion fracture/nondisplaced anterior calcaneus process fracture.  Advised patient to wear left cam walker boot 24/7 (except when bathing) until being evaluated by orthopedic provider.  Advised patient may RICE left ankle/foot for 25 minutes 3 times daily for the next 2 days to improve soft tissue swelling.  Advised patient may take Tramadol sparingly for breakthrough pain. ?

## 2021-08-01 NOTE — ED Provider Notes (Signed)
?[facility] ? ? ? ?CSN: [id number] ?Arrival date & time: 08/01/21  2947 ? ? ?  ? ?History   ?Chief Complaint ?Chief Complaint  ?Patient presents with  ? Ankle Pain  ?  Fell downstairs yesterday and rolled left ankle - Entered by patient  ? ? ?HPI ?Tracey Patterson is a 41 y.o. female.  ? ?HPI 41 year old female presents with left ankle pain secondary to falling down stairs yesterday.  PMH significant for fibromyalgia, morbid obesity, and chronic headache. ? ?Past Medical History:  ?Diagnosis Date  ? Anxiety   ? Arthritis   ? Asthma   ? Chronic headache   ? COVID-19 11/2020  ? Depression   ? Fibromyalgia   ? GERD (gastroesophageal reflux disease)   ? History of TMJ disorder   ? IBS (irritable bowel syndrome)   ? Obesity   ? Pneumonia   ? ? ?Patient Active Problem List  ? Diagnosis Date Noted  ? Asthma exacerbation 02/07/2020  ? Constipation 07/04/2019  ? Abdominal pain 07/04/2019  ? ANA positive 12/30/2017  ? Fibromyalgia 11/03/2017  ? Colitis 06/17/2017  ? Left ovarian cyst 06/17/2017  ? Left shoulder pain 09/07/2016  ? Degenerative tear of acetabular labrum of left hip 07/15/2016  ? Ganglion cyst 02/17/2016  ? Generalized anxiety disorder 02/17/2016  ? Recurrent sinusitis 02/17/2016  ? Vitamin D deficiency 02/15/2014  ? Asthma, chronic 12/14/2012  ? GERD (gastroesophageal reflux disease) 12/14/2012  ? Seasonal allergies 12/14/2012  ? Anxiety and depression 12/14/2012  ? Transaminitis 12/14/2012  ? Myalgia and myositis 10/30/2012  ? Other malaise and fatigue 10/26/2012  ? Flushing 10/26/2012  ? Insomnia 08/13/2012  ? Stress and adjustment reaction 08/13/2012  ? ? ?Past Surgical History:  ?Procedure Laterality Date  ? BACK SURGERY  December 2015  ? KNEE ARTHROSCOPY  1998  ? left  ? LAPAROSCOPIC TUBAL LIGATION  02/28/2012  ? Procedure: LAPAROSCOPIC TUBAL LIGATION;  Surgeon: Cyril Mourning, MD;  Location: [facility];  Service: Gynecology;  Laterality: Bilateral;  ? TONSILLECTOMY    ? ? ?OB History   ?No  obstetric history on file. ?  ? ? ? ?Home Medications   ? ?Prior to Admission medications   ?Medication Sig Start Date End Date Taking? Authorizing Provider  ?albuterol (VENTOLIN HFA) 108 (90 Base) MCG/ACT inhaler Inhale 2 puffs into the lungs every 6 (six) hours as needed for wheezing or shortness of breath. 12/21/20  Yes Sharion Balloon, FNP  ?Azelastine-Fluticasone 137-50 MCG/ACT SUSP Place 1 spray into the nose every 12 (twelve) hours. 05/05/21  Yes [name], NP  ?azithromycin (ZITHROMAX) 250 MG tablet Take 2 tablets by mouth on day 1, then 1 tablet daily on days 2 through 5 07/27/21 08/01/21 Yes Apolonio Schneiders, FNP  ?benzonatate (TESSALON) 100 MG capsule Take 1 capsule (100 mg total) by mouth 3 (three) times daily as needed. 07/27/21  Yes Apolonio Schneiders, FNP  ?buPROPion (WELLBUTRIN XL) 300 MG 24 hr tablet Take 1 tablet (300 mg total) by mouth daily. 07/17/21 07/17/22 Yes [name], NP  ?cetirizine (ZYRTEC) 10 MG tablet Take 1 tablet (10 mg total) by mouth daily. 09/20/13  Yes Hommel, Sean, DO  ?clonazePAM (KLONOPIN) 0.5 MG tablet Take 1/2 - 1 tablet (0.25-0.5 mg total) by mouth 2 (two) times daily as needed for anxiety. 07/17/21 10/15/21 Yes [name], NP  ?dexmethylphenidate (FOCALIN XR) 20 MG 24 hr capsule Take 1 capsule by mouth daily 07/10/21  Yes   ?DULoxetine (CYMBALTA) 30 MG capsule Take 1  capsule (30 mg total) by mouth daily in the morning. 07/17/21  Yes [name], NP  ?DULoxetine (CYMBALTA) 60 MG capsule Take 1 capsule (60 mg total) by mouth daily. 06/22/21  Yes [name], NP  ?gabapentin (NEURONTIN) 300 MG capsule Take 1 capsule (300 mg total) by mouth 3 (three) times daily as needed (nerve pain). 07/20/21  Yes Gregor Hams, MD  ?guanFACINE (INTUNIV) 2 MG TB24 ER tablet Take 1 tablet by mouth nightly 07/10/21  Yes   ?IBUPROFEN PO Take 800 mg by mouth as needed.    Yes [provider]  ?levonorgestrel (MIRENA, 52 MG,) 20 MCG/DAY IUD 1 Intra Uterine Device by Intrauterine route once.   Yes [provider]  ?Multiple Vitamin tablet Take 1 tablet by mouth daily.   Yes [provider]  ?pantoprazole (PROTONIX) 40 MG tablet TAKE 1 TABLET (40 MG TOTAL) BY MOUTH 2 (TWO) TIMES DAILY. 07/27/21 07/27/22 Yes [name], NP  ?predniSONE (DELTASONE) 20 MG tablet Take 1 tablet (20 mg total) by mouth 2 (two) times daily with a meal for 5 days. 07/27/21 08/01/21 Yes Apolonio Schneiders, [place]  ?QUEtiapine (SEROQUEL) 25 MG tablet Take 1-2 tablets (25-50 mg total) by mouth at bedtime. 02/09/21 09/09/21 Yes [name], NP  ?traMADol (ULTRAM) 50 MG tablet Take 1 tablet (50 mg total) by mouth daily as needed. 08/01/21  Yes Eliezer Lofts, FNP  ?amitriptyline (ELAVIL) 75 MG tablet Take 1 tablet (75 mg total) by mouth at bedtime as needed for sleep (pain). 04/22/20 05/12/20  [name], NP  ? ? ?Family History ?Family History  ?Problem Relation Age of Onset  ? Hypertension Mother   ? Depression Mother   ? Hypertension Father   ? Breast cancer Maternal Aunt   ? Prostate cancer Maternal Uncle   ? Prostate cancer Maternal Uncle   ? Colon cancer Maternal Uncle   ? Esophageal cancer Neg Hx   ? ? ?Social History ?Social History  ? ?Tobacco Use  ? Smoking status: Never  ? Smokeless tobacco: Never  ?Vaping Use  ? Vaping Use: Never used  ?Substance Use Topics  ? Alcohol use: Not Currently  ? Drug use: No  ? ? ? ?Allergies   ?Crab [shellfish allergy], Sulfa antibiotics, Zoloft [sertraline hcl], and Other ? ? ?Review of Systems ?Review of Systems  ?Musculoskeletal:   ?     Left ankle pain x 2 days  ? ? ?Physical Exam ?Triage Vital Signs ?ED Triage Vitals  ?Enc Vitals Group  ?   BP   ?   Pulse   ?   Resp   ?   Temp   ?   Temp src   ?   SpO2   ?   Weight   ?   Height   ?   Head Circumference   ?   Peak Flow   ?   Pain Score   ?   Pain Loc   ?   Pain Edu?   ?   Excl. in [place]?   ? ?No data found. ? ?Updated Vital Signs ?BP (!) 134/93 (BP Location: Right Arm)   Pulse (!) 107   Temp 98.3 ?F (36.8 ?C) (Oral)   Resp 18   Ht 5' 5.5" (1.664 m)   Wt  205 lb (93 kg)   SpO2 98%   BMI 33.59 kg/m?  ? ?  ? ?Physical Exam ?Vitals and nursing note reviewed.  ?Constitutional:   ?   General: She is  not in acute distress. ?   Appearance: Normal appearance. She is obese. She is not ill-appearing.  ?HENT:  ?   Head: Normocephalic and atraumatic.  ?   Mouth/Throat:  ?   Mouth: Mucous membranes are moist.  ?   Pharynx: Oropharynx is clear.  ?Eyes:  ?   Extraocular Movements: Extraocular movements intact.  ?   Conjunctiva/sclera: Conjunctivae normal.  ?   Pupils: Pupils are equal, round, and reactive to light.  ?Cardiovascular:  ?   Rate and Rhythm: Normal rate and regular rhythm.  ?   Pulses: Normal pulses.  ?   Heart sounds: Normal heart sounds.  ?Pulmonary:  ?   Effort: Pulmonary effort is normal.  ?   Breath sounds: Normal breath sounds. No wheezing, rhonchi or rales.  ?Musculoskeletal:     ?   General: Normal range of motion.  ?   Cervical back: Normal range of motion and neck supple.  ?   Comments: Left ankle: TTP over lateral malleolus with moderate to significant soft tissue swelling, exam limited due to pain and swelling  ?Skin: ?   General: Skin is warm and dry.  ?Neurological:  ?   General: No focal deficit present.  ?   Mental Status: She is alert and oriented to person, place, and time.  ? ? ? ?UC Treatments / Results  ?Labs ?(all labs ordered are listed, but only abnormal results are displayed) ?Labs Reviewed - No data to display ? ?EKG ? ? ?Radiology ?DG Ankle Complete Left ? ?Result Date: 08/01/2021 ?CLINICAL DATA:  Pain EXAM: LEFT ANKLE COMPLETE - 3 VIEW; LEFT FOOT - COMPLETE 3 VIEW COMPARISON:  None Available. FINDINGS: Osseous fragment is seen in the soft tissues inferior to the lateral malleolus. Osseous fragment is also seen at the area of the anterior process of the calcaneus. No evidence of arthropathy or other focal bone abnormality. Soft tissue swelling, most pronounced at the lateral malleolus. IMPRESSION: 1. Osseous fragment is seen in the soft  tissues inferior to the lateral malleolus, compatible with avulsion fracture, potentially from the calcaneal attachment of the calcaneofibular ligament. 2. Osseous fragment is also seen at the area of the anterior process

## 2021-08-03 ENCOUNTER — Encounter: Payer: Self-pay | Admitting: Family Medicine

## 2021-08-03 ENCOUNTER — Ambulatory Visit (INDEPENDENT_AMBULATORY_CARE_PROVIDER_SITE_OTHER): Payer: No Typology Code available for payment source | Admitting: Family Medicine

## 2021-08-03 VITALS — BP 128/80 | HR 91 | Ht 65.5 in

## 2021-08-03 DIAGNOSIS — S92902A Unspecified fracture of left foot, initial encounter for closed fracture: Secondary | ICD-10-CM

## 2021-08-03 DIAGNOSIS — S82892A Other fracture of left lower leg, initial encounter for closed fracture: Secondary | ICD-10-CM | POA: Diagnosis not present

## 2021-08-03 NOTE — Progress Notes (Signed)
? ?I, Peterson Lombard, LAT, ATC acting as a scribe for Tracey Leader, MD. ? ?Tracey Patterson is a 41 y.o. female who presents to [place] at [facility] today for L ankle pain. Pt was previously seen by Dr. Georgina Patterson on 06/17/20 for R wrist pain. Today, pt c/o L ankle pain ongoing since 07/31/21 after "rolling" her L ankle into INV, falling down the stairs while on a field trip w/ her daughter in [place], [place]. Pt notes she felt a "pop." Pt was seen at the [facility] on 5/6 and XR revealed an avulsion fracture, potentially from the calcaneal attachment of the calcaneofibular ligament, osseous fragment at the area of the anterior process of the calcaneus, and concern for a calcaneal fx. Pt was advised to wear a CAM walker boot. Today, pt reports bruising along the lateral aspect of the L ankle and foot. Pt is not TTP anywhere over the L foot and ankle. 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 Pt notes limited toe AROM and ankle AROM. ? ?R ankle swelling: yes ?Treatments tried: CAM walker boot, ice, rest, IBU 810m ? ?Dx imaging: 08/02/10 L foot & ankle XR ? ?Pertinent review of systems: No fevers or chills ? ?Relevant historical information: Anxiety disorder.  Prior MSK pains.  Works as a xGeologist, engineering? ? ?Exam:  ?BP 128/80   Pulse 91   Ht 5' 5.5" (1.664 m)   SpO2 99%   BMI 33.59 kg/m?  ?General: Well Developed, well nourished, and in no acute distress.  ? ?MSK: Left foot and ankle: ?Ankle effusion is present.  Ecchymosis and bruising present lateral foot and ankle. ?Not particularly tender to palpation. ?Range of motion is decreased. ?Strength is intact. ?Able to walk without significant pain with a cam walker boot ? ? ? ?Lab and Radiology Results ?No results found for this or any previous visit (from the past 72 hour(s)). ?DG Ankle Complete Left ? ?Result Date: 08/01/2021 ?CLINICAL DATA:  Pain EXAM: LEFT ANKLE COMPLETE - 3 VIEW; LEFT FOOT - COMPLETE 3 VIEW COMPARISON:  None Available. FINDINGS: Osseous fragment  is seen in the soft tissues inferior to the lateral malleolus. Osseous fragment is also seen at the area of the anterior process of the calcaneus. No evidence of arthropathy or other focal bone abnormality. Soft tissue swelling, most pronounced at the lateral malleolus. IMPRESSION: 1. Osseous fragment is seen in the soft tissues inferior to the lateral malleolus, compatible with avulsion fracture, potentially from the calcaneal attachment of the calcaneofibular ligament. 2. Osseous fragment is also seen at the area of the anterior process of the calcaneus, possibly an accessory ossicle, although nondisplaced anterior calcaneal process fracture is also a concern. Electronically Signed   By: Tracey GlassmanM.D.   On: 08/01/2021 10:04  ? ?DG Foot Complete Left ? ?Result Date: 08/01/2021 ?CLINICAL DATA:  Pain EXAM: LEFT ANKLE COMPLETE - 3 VIEW; LEFT FOOT - COMPLETE 3 VIEW COMPARISON:  None Available. FINDINGS: Osseous fragment is seen in the soft tissues inferior to the lateral malleolus. Osseous fragment is also seen at the area of the anterior process of the calcaneus. No evidence of arthropathy or other focal bone abnormality. Soft tissue swelling, most pronounced at the lateral malleolus. IMPRESSION: 1. Osseous fragment is seen in the soft tissues inferior to the lateral malleolus, compatible with avulsion fracture, potentially from the calcaneal attachment of the calcaneofibular ligament. 2. Osseous fragment is also seen at the area of the anterior process of the calcaneus, possibly an  accessory ossicle, although nondisplaced anterior calcaneal process fracture is also a concern. Electronically Signed   By: Tracey Patterson M.D.   On: 08/01/2021 10:04   ? ? ?I, Tracey Patterson, personally (independently) visualized and performed the interpretation of the images attached in this note. ? ? ?Assessment and Plan: ?41 y.o. female with left foot and ankle pain after an inversion injury.  X-ray on May 6 shows avulsion fracture  at the CFL insertion onto the calcaneus as well as a calcaneal anterior calcaneal process fracture.  This should both do quite well with time.  She currently has a short cam walker boot which I think is going to be sufficient.  We spent time talking about work.  She works as an Geologist, engineering in the hospital.  Since she is doing pretty well without a lot of pain I think it is okay for her to work with a cam walker boot as long as her job will allow bad.  I do recommend compression to work with a cam walker. ?We will prevent swelling.  Recheck in 2 weeks.  We will repeat x-rays then. ? ? ? ? ? ?Discussed warning signs or symptoms. Please see discharge instructions. Patient expresses understanding. ? ? ?The above documentation has been reviewed and is accurate and complete Tracey Patterson, M.D. ? ? ?

## 2021-08-03 NOTE — Patient Instructions (Addendum)
Thank you for coming in today.  ? ?Continue wearing CAM walking boot ? ?Recheck back in 2 weeks ?

## 2021-08-04 ENCOUNTER — Encounter: Payer: Self-pay | Admitting: Family Medicine

## 2021-08-06 ENCOUNTER — Other Ambulatory Visit (HOSPITAL_BASED_OUTPATIENT_CLINIC_OR_DEPARTMENT_OTHER): Payer: Self-pay

## 2021-08-06 MED ORDER — DEXMETHYLPHENIDATE HCL ER 20 MG PO CP24
ORAL_CAPSULE | ORAL | 0 refills | Status: DC
  Filled 2021-08-10: qty 30, 30d supply, fill #0

## 2021-08-10 ENCOUNTER — Other Ambulatory Visit (HOSPITAL_BASED_OUTPATIENT_CLINIC_OR_DEPARTMENT_OTHER): Payer: Self-pay

## 2021-08-18 ENCOUNTER — Encounter: Payer: Self-pay | Admitting: Orthopaedic Surgery

## 2021-08-18 ENCOUNTER — Ambulatory Visit (INDEPENDENT_AMBULATORY_CARE_PROVIDER_SITE_OTHER): Payer: No Typology Code available for payment source | Admitting: Orthopaedic Surgery

## 2021-08-18 DIAGNOSIS — S93412A Sprain of calcaneofibular ligament of left ankle, initial encounter: Secondary | ICD-10-CM | POA: Diagnosis not present

## 2021-08-18 NOTE — Progress Notes (Signed)
Office Visit Note   Patient: Tracey Patterson           Date of Birth: October 15, 1980           MRN: [id number] Visit Date: 08/18/2021              Requested by: [name], NP [address] [place],  [place] [address] PCP: [name], NP   Assessment & Plan: Visit Diagnoses:  1. Sprain of calcaneofibular ligament of left ankle, initial encounter     Plan: I impression is left ankle sprain.  Treatment options were reviewed and she has had significant improvement so we will hold off on PT for now.  We will place her in an ASO brace that she can wean into from a cam boot as tolerated.  Increase activity as tolerated.  She does not feel like she is improving quickly enough we can consider sending her to physical therapy.  Follow-Up Instructions: No follow-ups on file.   Orders:  No orders of the defined types were placed in this encounter.  No orders of the defined types were placed in this encounter.     Procedures: No procedures performed   Clinical Data: No additional findings.   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  She was on a field trip and missed a step and inverted her ankle.  She went to the urgent care and was placed in a cam boot.  Her friends recommended that she come see me.  She has no pain unless she is on her feet a lot and is active.  She feels like the swelling and the bruising have significantly improved.  She has not missed any work. Review of Systems  Constitutional: Negative.   HENT: Negative.    Eyes: Negative.   Respiratory: Negative.    Cardiovascular: Negative.   Endocrine: Negative.   Musculoskeletal: Negative.   Neurological: Negative.   Hematological: Negative.   Psychiatric/Behavioral: Negative.    All other  systems reviewed and are negative.   Objective: Vital Signs: There were no vitals taken for this visit.  Physical Exam Vitals and nursing note reviewed.  Constitutional:      Appearance: She is well-developed.  HENT:     Head: Normocephalic and atraumatic.  Pulmonary:     Effort: Pulmonary effort is normal.  Abdominal:     Palpations: Abdomen is soft.  Musculoskeletal:     Cervical back: Neck supple.  Skin:    General: Skin is warm.     Capillary Refill: Capillary refill takes less than 2 seconds.  Neurological:     Mental Status: She is alert and oriented to person, place, and time.  Psychiatric:        Behavior: Behavior normal.        Thought Content: Thought content normal.        Judgment: Judgment normal.    Ortho Exam Examination of left ankle shows negative anterior drawer.  Mild swelling.  Mild tenderness throughout the ankle and along the peroneals.  The posterior tibial tendon and peroneal tendons are stable.  Motor or sensory function intact.  She is tender to the lateral ankle ligaments more so over the ATFL. Specialty Comments:  No specialty  comments available.  Imaging: No results found.   PMFS History: Patient Active Problem List   Diagnosis Date Noted   Sprain of calcaneofibular ligament of left ankle 08/18/2021   Asthma exacerbation 02/07/2020   Constipation 07/04/2019   Abdominal pain 07/04/2019   ANA positive 12/30/2017   Fibromyalgia 11/03/2017   Colitis 06/17/2017   Left ovarian cyst 06/17/2017   Left shoulder pain 09/07/2016   Degenerative tear of acetabular labrum of left hip 07/15/2016   Ganglion cyst 02/17/2016   Generalized anxiety disorder 02/17/2016   Recurrent sinusitis 02/17/2016   Vitamin D deficiency 02/15/2014   Asthma, chronic 12/14/2012   GERD (gastroesophageal reflux disease) 12/14/2012   Seasonal allergies 12/14/2012   Anxiety and depression 12/14/2012   Transaminitis 12/14/2012   Myalgia and myositis 10/30/2012    Other malaise and fatigue 10/26/2012   Flushing 10/26/2012   Insomnia 08/13/2012   Stress and adjustment reaction 08/13/2012   Past Medical History:  Diagnosis Date   Anxiety    Arthritis    Asthma    Chronic headache    COVID-19 11/2020   Depression    Fibromyalgia    GERD (gastroesophageal reflux disease)    History of TMJ disorder    IBS (irritable bowel syndrome)    Obesity    Pneumonia     Family History  Problem Relation Age of Onset   Hypertension Mother    Depression Mother    Hypertension Father    Breast cancer Maternal Aunt    Prostate cancer Maternal Uncle    Prostate cancer Maternal Uncle    Colon cancer Maternal Uncle    Esophageal cancer Neg Hx     Past Surgical History:  Procedure Laterality Date   BACK SURGERY  December 2015   KNEE ARTHROSCOPY  1998   left   LAPAROSCOPIC TUBAL LIGATION  02/28/2012   Procedure: LAPAROSCOPIC TUBAL LIGATION;  Surgeon: Cyril Mourning, MD;  Location: [facility];  Service: Gynecology;  Laterality: Bilateral;   TONSILLECTOMY     Social History   Occupational History   Not on file  Tobacco Use   Smoking status: Never   Smokeless tobacco: Never  Vaping Use   Vaping Use: Never used  Substance and Sexual Activity   Alcohol use: Not Currently   Drug use: No   Sexual activity: Not on file

## 2021-08-18 NOTE — Progress Notes (Unsigned)
   I, Wendy Poet, LAT, ATC, am serving as scribe for Dr. [name].  Tracey Patterson is a 41 y.o. female who presents to [place] at [facility] today for f/u of L ankle pain due to an avulsion fracture at the CFL insertion onto the calcaneus as well as a calcaneal anterior calcaneal process fracture after rolling her ankle and falling down some stairs on 07/31/21 while on a field trip w/ her daughter in [place], [place].  She was last seen by Dr. Georgina Snell on 08/03/21 and was advised to con't wearing her CAM walker boot.  She was most recently seen by Dr. Erlinda Hong on 08/18/21.  Today, pt reports   Diagnostic testing: L foot and ankle XR- 08/01/21  Pertinent review of systems: ***  Relevant historical information: ***   Exam:  There were no vitals taken for this visit. General: Well Developed, well nourished, and in no acute distress.   MSK: ***    Lab and Radiology Results No results found for this or any previous visit (from the past 72 hour(s)). No results found.     Assessment and Plan: 41 y.o. female with ***   PDMP not reviewed this encounter. No orders of the defined types were placed in this encounter.  No orders of the defined types were placed in this encounter.    Discussed warning signs or symptoms. Please see discharge instructions. Patient expresses understanding.   ***

## 2021-08-19 ENCOUNTER — Other Ambulatory Visit (HOSPITAL_BASED_OUTPATIENT_CLINIC_OR_DEPARTMENT_OTHER): Payer: Self-pay

## 2021-08-19 ENCOUNTER — Ambulatory Visit: Payer: No Typology Code available for payment source | Admitting: Family Medicine

## 2021-08-20 ENCOUNTER — Other Ambulatory Visit (HOSPITAL_BASED_OUTPATIENT_CLINIC_OR_DEPARTMENT_OTHER): Payer: Self-pay

## 2021-08-21 ENCOUNTER — Other Ambulatory Visit (HOSPITAL_BASED_OUTPATIENT_CLINIC_OR_DEPARTMENT_OTHER): Payer: Self-pay

## 2021-08-21 MED ORDER — DEXMETHYLPHENIDATE HCL ER 20 MG PO CP24
20.0000 mg | ORAL_CAPSULE | Freq: Every day | ORAL | 0 refills | Status: DC
  Filled 2021-09-13: qty 30, 30d supply, fill #0

## 2021-09-01 ENCOUNTER — Other Ambulatory Visit (HOSPITAL_BASED_OUTPATIENT_CLINIC_OR_DEPARTMENT_OTHER): Payer: Self-pay

## 2021-09-01 MED ORDER — DEXMETHYLPHENIDATE HCL ER 30 MG PO CP24
ORAL_CAPSULE | ORAL | 0 refills | Status: DC
  Filled 2021-09-01: qty 30, 30d supply, fill #0

## 2021-09-02 ENCOUNTER — Encounter: Payer: Self-pay | Admitting: Orthopaedic Surgery

## 2021-09-07 ENCOUNTER — Other Ambulatory Visit: Payer: Self-pay | Admitting: Medical-Surgical

## 2021-09-08 ENCOUNTER — Other Ambulatory Visit (HOSPITAL_BASED_OUTPATIENT_CLINIC_OR_DEPARTMENT_OTHER): Payer: Self-pay

## 2021-09-10 ENCOUNTER — Other Ambulatory Visit (HOSPITAL_BASED_OUTPATIENT_CLINIC_OR_DEPARTMENT_OTHER): Payer: Self-pay

## 2021-09-10 MED ORDER — QUETIAPINE FUMARATE 25 MG PO TABS
25.0000 mg | ORAL_TABLET | Freq: Every day | ORAL | 1 refills | Status: DC
  Filled 2021-09-10: qty 180, 90d supply, fill #0

## 2021-09-14 ENCOUNTER — Other Ambulatory Visit (HOSPITAL_BASED_OUTPATIENT_CLINIC_OR_DEPARTMENT_OTHER): Payer: Self-pay

## 2021-09-18 ENCOUNTER — Encounter: Payer: Self-pay | Admitting: Medical-Surgical

## 2021-09-18 ENCOUNTER — Other Ambulatory Visit (HOSPITAL_COMMUNITY): Payer: Self-pay

## 2021-09-18 DIAGNOSIS — Z131 Encounter for screening for diabetes mellitus: Secondary | ICD-10-CM

## 2021-09-18 DIAGNOSIS — Z0000 Encounter for general adult medical examination without abnormal findings: Secondary | ICD-10-CM

## 2021-09-18 DIAGNOSIS — Z13220 Encounter for screening for lipoid disorders: Secondary | ICD-10-CM

## 2021-09-18 DIAGNOSIS — E559 Vitamin D deficiency, unspecified: Secondary | ICD-10-CM

## 2021-09-18 DIAGNOSIS — Z1329 Encounter for screening for other suspected endocrine disorder: Secondary | ICD-10-CM

## 2021-09-22 ENCOUNTER — Encounter: Payer: Self-pay | Admitting: Orthopaedic Surgery

## 2021-09-22 ENCOUNTER — Ambulatory Visit (INDEPENDENT_AMBULATORY_CARE_PROVIDER_SITE_OTHER): Payer: No Typology Code available for payment source

## 2021-09-22 ENCOUNTER — Ambulatory Visit (INDEPENDENT_AMBULATORY_CARE_PROVIDER_SITE_OTHER): Payer: No Typology Code available for payment source | Admitting: Orthopaedic Surgery

## 2021-09-22 ENCOUNTER — Other Ambulatory Visit (HOSPITAL_COMMUNITY): Payer: Self-pay

## 2021-09-22 DIAGNOSIS — S93412A Sprain of calcaneofibular ligament of left ankle, initial encounter: Secondary | ICD-10-CM

## 2021-09-22 NOTE — Progress Notes (Signed)
Office Visit Note   Patient: Tracey Patterson           Date of Birth: [birth year]           MRN: [id number] Visit Date: 09/22/2021              Requested by: Christen Butter, NP [address] [place],  [place] [address] PCP: Christen Butter, NP   Assessment & Plan: Visit Diagnoses:  1. Sprain of calcaneofibular ligament of left ankle, initial encounter     Plan: Impression is aggravation of left ankle sprain avulsion fracture.  Given these findings I recommend going back into a cam boot for another couple weeks and then wean as tolerated.  She will send me a message when she is ready to go to physical therapy which I think will really benefit her.  Follow-Up Instructions: No follow-ups on file.   Orders:  Orders Placed This Encounter  Procedures   XR Ankle Complete Left   No orders of the defined types were placed in this encounter.     Procedures: No procedures performed   Clinical Data: No additional findings.   Subjective: Chief Complaint  Patient presents with   Left Ankle - Follow-up    Ankle sprain    HPI Needed returns today for follow-up of left ankle sprain and avulsion fracture of the calcaneus.  She was doing fine until recently she reaggravated by stepping on a ball which turned her ankle little bit.  She is now feeling swelling in pain again to the lateral side of the ankle wrapping around the front.  Review of Systems   Objective: Vital Signs: There were no vitals taken for this visit.  Physical Exam  Ortho Exam Examination of the left ankle joint is stable to ligamentous exam.  She is tender over the lateral aspect of the ankle with moderate swelling.  No neurovascular compromise. Specialty Comments:  No specialty comments available.  Imaging: No results found.   PMFS History: Patient Active Problem List   Diagnosis Date Noted   Sprain of calcaneofibular ligament of left ankle 08/18/2021   Asthma exacerbation 02/07/2020    Constipation 07/04/2019   Abdominal pain 07/04/2019   ANA positive 12/30/2017   Fibromyalgia 11/03/2017   Colitis 06/17/2017   Left ovarian cyst 06/17/2017   Left shoulder pain 09/07/2016   Degenerative tear of acetabular labrum of left hip 07/15/2016   Ganglion cyst 02/17/2016   Generalized anxiety disorder 02/17/2016   Recurrent sinusitis 02/17/2016   Vitamin D deficiency 02/15/2014   Asthma, chronic 12/14/2012   GERD (gastroesophageal reflux disease) 12/14/2012   Seasonal allergies 12/14/2012   Anxiety and depression 12/14/2012   Transaminitis 12/14/2012   Myalgia and myositis 10/30/2012   Other malaise and fatigue 10/26/2012   Flushing 10/26/2012   Insomnia 08/13/2012   Stress and adjustment reaction 08/13/2012   Past Medical History:  Diagnosis Date   Anxiety    Arthritis    Asthma    Chronic headache    COVID-19 11/2020   Depression    Fibromyalgia    GERD (gastroesophageal reflux disease)    History of TMJ disorder    IBS (irritable bowel syndrome)    Obesity    Pneumonia     Family History  Problem Relation Age of Onset   Hypertension Mother    Depression Mother    Hypertension Father    Breast cancer Maternal Aunt    Prostate cancer Maternal Uncle  Prostate cancer Maternal Uncle    Colon cancer Maternal Uncle    Esophageal cancer Neg Hx     Past Surgical History:  Procedure Laterality Date   BACK SURGERY  December 2015   KNEE ARTHROSCOPY  1998   left   LAPAROSCOPIC TUBAL LIGATION  02/28/2012   Procedure: LAPAROSCOPIC TUBAL LIGATION;  Surgeon: Jeani Hawking, MD;  Location: [facility];  Service: Gynecology;  Laterality: Bilateral;   TONSILLECTOMY     Social History   Occupational History   Not on file  Tobacco Use   Smoking status: Never   Smokeless tobacco: Never  Vaping Use   Vaping Use: Never used  Substance and Sexual Activity   Alcohol use: Not Currently   Drug use: No   Sexual activity: Not on file

## 2021-09-28 ENCOUNTER — Encounter: Payer: Self-pay | Admitting: Medical-Surgical

## 2021-10-01 ENCOUNTER — Encounter: Payer: No Typology Code available for payment source | Admitting: Medical-Surgical

## 2021-10-04 ENCOUNTER — Encounter: Payer: Self-pay | Admitting: Orthopaedic Surgery

## 2021-10-05 ENCOUNTER — Other Ambulatory Visit: Payer: Self-pay

## 2021-10-05 DIAGNOSIS — S93412A Sprain of calcaneofibular ligament of left ankle, initial encounter: Secondary | ICD-10-CM

## 2021-10-05 NOTE — Telephone Encounter (Signed)
Yes please send to PT for ankle rehab.  Thanks.

## 2021-10-14 ENCOUNTER — Other Ambulatory Visit (HOSPITAL_BASED_OUTPATIENT_CLINIC_OR_DEPARTMENT_OTHER): Payer: Self-pay

## 2021-10-14 ENCOUNTER — Encounter: Payer: Self-pay | Admitting: Medical-Surgical

## 2021-10-14 ENCOUNTER — Ambulatory Visit (INDEPENDENT_AMBULATORY_CARE_PROVIDER_SITE_OTHER): Payer: No Typology Code available for payment source | Admitting: Medical-Surgical

## 2021-10-14 VITALS — BP 111/78 | HR 110 | Resp 20 | Ht 65.5 in | Wt 204.2 lb

## 2021-10-14 DIAGNOSIS — F5101 Primary insomnia: Secondary | ICD-10-CM | POA: Diagnosis not present

## 2021-10-14 DIAGNOSIS — K219 Gastro-esophageal reflux disease without esophagitis: Secondary | ICD-10-CM

## 2021-10-14 DIAGNOSIS — Z0000 Encounter for general adult medical examination without abnormal findings: Secondary | ICD-10-CM | POA: Diagnosis not present

## 2021-10-14 DIAGNOSIS — M797 Fibromyalgia: Secondary | ICD-10-CM

## 2021-10-14 MED ORDER — PANTOPRAZOLE SODIUM 40 MG PO TBEC
DELAYED_RELEASE_TABLET | Freq: Two times a day (BID) | ORAL | 3 refills | Status: DC
  Filled 2021-10-14: qty 180, fill #0
  Filled 2021-12-06: qty 180, 90d supply, fill #0

## 2021-10-14 MED ORDER — DULOXETINE HCL 60 MG PO CPEP
60.0000 mg | ORAL_CAPSULE | Freq: Every day | ORAL | 1 refills | Status: DC
  Filled 2021-10-14 – 2021-12-23 (×2): qty 90, 90d supply, fill #0
  Filled 2022-03-26 – 2022-04-05 (×2): qty 90, 90d supply, fill #1

## 2021-10-14 MED ORDER — QUETIAPINE FUMARATE 50 MG PO TABS
50.0000 mg | ORAL_TABLET | Freq: Every day | ORAL | 3 refills | Status: DC
  Filled 2021-10-14: qty 90, 90d supply, fill #0

## 2021-10-14 MED ORDER — MONTELUKAST SODIUM 10 MG PO TABS
10.0000 mg | ORAL_TABLET | Freq: Every day | ORAL | 3 refills | Status: DC
  Filled 2021-10-14: qty 90, 90d supply, fill #0
  Filled 2022-01-21: qty 90, 90d supply, fill #1

## 2021-10-14 NOTE — Progress Notes (Signed)
Complete physical exam  Patient: Tracey Patterson   DOB: September 23, 1980   41 y.o. Female  MRN: [id number]  Subjective:    Chief Complaint  Patient presents with   Annual Exam    Tracey Patterson is a 41 y.o. female who presents today for a complete physical exam. She reports consuming a general diet. Exercise is limited by orthopedic condition(s): recent ankle fracture. She generally feels fairly well. She reports sleeping fairly well. She does not have additional problems to discuss today.   Most recent fall risk assessment:    10/14/2021   11:30 AM  Fall Risk   Falls in the past year? 1  Number falls in past yr: 0  Injury with Fall? 1  Risk for fall due to : History of fall(s)  Follow up Falls evaluation completed     Most recent depression screenings:    10/14/2021   11:30 AM 07/17/2021    4:42 PM  PHQ 2/9 Scores  PHQ - 2 Score 3 3  PHQ- 9 Score 16 14    Vision:Within last year, Dental: No current dental problems and Receives regular dental care, and STD: The patient denies history of sexually transmitted disease.    Patient Care Team: [name], NP as PCP - General (Nurse Practitioner)   Outpatient Medications Prior to Visit  Medication Sig   albuterol (VENTOLIN HFA) 108 (90 Base) MCG/ACT inhaler Inhale 2 puffs into the lungs every 6 (six) hours as needed for wheezing or shortness of breath.   Azelastine-Fluticasone 137-50 MCG/ACT SUSP Place 1 spray into the nose every 12 (twelve) hours.   buPROPion (WELLBUTRIN XL) 300 MG 24 hr tablet Take 1 tablet (300 mg total) by mouth daily.   cetirizine (ZYRTEC) 10 MG tablet Take 1 tablet (10 mg total) by mouth daily.   clonazePAM (KLONOPIN) 0.5 MG tablet Take 1/2 - 1 tablet (0.25-0.5 mg total) by mouth 2 (two) times daily as needed for anxiety.   Dexmethylphenidate HCl 30 MG CP24 Take 1 capsule by mouth daily   DULoxetine (CYMBALTA) 30 MG capsule Take 1 capsule (30 mg total) by mouth daily in the morning.   gabapentin  (NEURONTIN) 300 MG capsule Take 1 capsule (300 mg total) by mouth 3 (three) times daily as needed (nerve pain).   guanFACINE (INTUNIV) 2 MG TB24 ER tablet Take 1 tablet by mouth nightly   IBUPROFEN PO Take 800 mg by mouth as needed.    levonorgestrel (MIRENA, 52 MG,) 20 MCG/DAY IUD 1 Intra Uterine Device by Intrauterine route once.   Multiple Vitamin tablet Take 1 tablet by mouth daily.   [DISCONTINUED] benzonatate (TESSALON) 100 MG capsule Take 1 capsule (100 mg total) by mouth 3 (three) times daily as needed.   [DISCONTINUED] dexmethylphenidate (FOCALIN XR) 20 MG 24 hr capsule Take 1 capsule by mouth daily   [DISCONTINUED] DULoxetine (CYMBALTA) 60 MG capsule Take 1 capsule (60 mg total) by mouth daily.   [DISCONTINUED] pantoprazole (PROTONIX) 40 MG tablet TAKE 1 TABLET (40 MG TOTAL) BY MOUTH 2 (TWO) TIMES DAILY.   [DISCONTINUED] QUEtiapine (SEROQUEL) 25 MG tablet Take 1-2 tablets (25-50 mg total) by mouth at bedtime.   No facility-administered medications prior to visit.   Review of Systems  Constitutional:  Negative for chills, diaphoresis and fever.  HENT:         Right ear feeling clogged constantly, black flaky discharge for a week then it resolved.  Respiratory:  Positive for cough and shortness of breath. Negative for sputum production  and wheezing.   Cardiovascular:  Positive for chest pain (with coughing spells). Negative for palpitations and leg swelling.  Gastrointestinal:  Positive for abdominal pain (IBS). Negative for blood in stool, melena, nausea and vomiting.  Genitourinary:  Negative for dysuria, frequency, hematuria and urgency.  Musculoskeletal:  Positive for joint pain and myalgias.  Skin:  Positive for itching.  Neurological:  Positive for dizziness and headaches. Negative for weakness.  Endo/Heme/Allergies:  Positive for environmental allergies.  Psychiatric/Behavioral:  Positive for depression. Negative for hallucinations, substance abuse and suicidal ideas. The  patient is nervous/anxious. The patient does not have insomnia.      Objective:    BP 111/78 (BP Location: Left Arm, Cuff Size: Large)   Pulse (!) 110   Resp 20   Ht 5' 5.5" (1.664 m)   Wt 204 lb 3.2 oz (92.6 kg)   SpO2 100%   BMI 33.46 kg/m    Physical Exam Constitutional:      General: She is not in acute distress.    Appearance: Normal appearance. She is not ill-appearing.  HENT:     Head: Normocephalic and atraumatic.     Right Ear: Tympanic membrane, ear canal and external ear normal. There is no impacted cerumen.     Left Ear: Tympanic membrane, ear canal and external ear normal. There is no impacted cerumen.     Nose: Nose normal.     Mouth/Throat:     Mouth: Mucous membranes are moist.     Pharynx: No oropharyngeal exudate or posterior oropharyngeal erythema.  Eyes:     General:        Right eye: No discharge.        Left eye: No discharge.     Extraocular Movements: Extraocular movements intact.     Conjunctiva/sclera:     Right eye: Right conjunctiva is injected.     Left eye: Left conjunctiva is injected.     Pupils: Pupils are equal, round, and reactive to light.  Neck:     Thyroid: No thyromegaly.     Vascular: No carotid bruit or JVD.     Trachea: Trachea normal.  Cardiovascular:     Rate and Rhythm: Normal rate and regular rhythm.     Pulses: Normal pulses.     Heart sounds: Normal heart sounds. No murmur heard.    No friction rub. No gallop.  Pulmonary:     Effort: Pulmonary effort is normal. No respiratory distress.     Breath sounds: Normal breath sounds. No wheezing.  Abdominal:     General: Bowel sounds are normal. There is no distension.     Palpations: Abdomen is soft.     Tenderness: There is no abdominal tenderness. There is no guarding.  Musculoskeletal:        General: Normal range of motion.     Cervical back: Normal range of motion and neck supple.  Skin:    General: Skin is warm and dry.  Neurological:     Mental Status: She is  alert and oriented to person, place, and time.     Cranial Nerves: No cranial nerve deficit.  Psychiatric:        Mood and Affect: Mood normal.        Behavior: Behavior normal.        Thought Content: Thought content normal.        Judgment: Judgment normal.      No results found for any visits on 10/14/21.  Assessment & Plan:    Routine Health Maintenance and Physical Exam  Immunization History  Administered Date(s) Administered   Influenza-Unspecified 12/28/2014, 01/06/2018, 01/14/2019, 01/10/2020   PFIZER(Purple Top)SARS-COV-2 Vaccination 06/07/2019, 06/29/2019, 03/25/2020   Tdap 02/09/2017   Unspecified SARS-COV-2 Vaccination 06/08/2019, 06/28/2019    Health Maintenance  Topic Date Due   COVID-19 Vaccine (6 - Mixed Product series) 10/30/2021 (Originally 05/20/2020)   INFLUENZA VACCINE  10/27/2021   PAP SMEAR-Modifier  03/15/2022   TETANUS/TDAP  02/10/2027   HIV Screening  Completed   HPV VACCINES  Aged Out   Hepatitis C Screening  Discontinued    Discussed health benefits of physical activity, and encouraged her to engage in regular exercise appropriate for her age and condition.  1. Annual physical exam Checking labs today.  Up-to-date on preventative care.  Wellness information provided with AVS.  2. Gastroesophageal reflux disease without esophagitis Stable.  Continue pantoprazole 40 mg twice daily as needed.  Limit to once daily as much as possible to reduce the risk of osteoporosis and significant GI issues. - pantoprazole (PROTONIX) 40 MG tablet; TAKE 1 TABLET (40 MG TOTAL) BY MOUTH 2 (TWO) TIMES DAILY.  Dispense: 180 tablet; Refill: 3  3. Fibromyalgia Refilling Cymbalta.  Continue 90 mg daily as prescribed. - DULoxetine (CYMBALTA) 60 MG capsule; Take 1 capsule (60 mg total) by mouth daily.  Dispense: 90 capsule; Refill: 1  4. Primary insomnia Doing well with current medications.  Continue Seroquel 50 mg nightly. - QUEtiapine (SEROQUEL) 50 MG tablet;  Take 1 tablet (50 mg total) by mouth at bedtime.  Dispense: 90 tablet; Refill: 3  Return in about 6 months (around 04/16/2022) for chronic disease follow up.   [name], NP

## 2021-10-15 ENCOUNTER — Other Ambulatory Visit (HOSPITAL_BASED_OUTPATIENT_CLINIC_OR_DEPARTMENT_OTHER): Payer: Self-pay

## 2021-10-15 MED ORDER — DEXMETHYLPHENIDATE HCL ER 30 MG PO CP24
1.0000 | ORAL_CAPSULE | Freq: Every day | ORAL | 0 refills | Status: DC
  Filled 2021-10-15: qty 30, 30d supply, fill #0

## 2021-10-16 ENCOUNTER — Other Ambulatory Visit (HOSPITAL_BASED_OUTPATIENT_CLINIC_OR_DEPARTMENT_OTHER): Payer: Self-pay

## 2021-10-16 LAB — COMPLETE METABOLIC PANEL WITH GFR
AG Ratio: 2 (calc) (ref 1.0–2.5)
ALT: 23 U/L (ref 6–29)
AST: 13 U/L (ref 10–30)
Albumin: 4.6 g/dL (ref 3.6–5.1)
Alkaline phosphatase (APISO): 136 U/L — ABNORMAL HIGH (ref 31–125)
BUN: 10 mg/dL (ref 7–25)
CO2: 23 mmol/L (ref 20–32)
Calcium: 9.4 mg/dL (ref 8.6–10.2)
Chloride: 103 mmol/L (ref 98–110)
Creat: 0.88 mg/dL (ref 0.50–0.99)
Globulin: 2.3 g/dL (calc) (ref 1.9–3.7)
Glucose, Bld: 92 mg/dL (ref 65–99)
Potassium: 4.5 mmol/L (ref 3.5–5.3)
Sodium: 138 mmol/L (ref 135–146)
Total Bilirubin: 0.4 mg/dL (ref 0.2–1.2)
Total Protein: 6.9 g/dL (ref 6.1–8.1)
eGFR: 84 mL/min/{1.73_m2} (ref >=60)

## 2021-10-16 LAB — CBC WITH DIFFERENTIAL/PLATELET
Absolute Monocytes: 564 cells/uL (ref 200–950)
Basophils Absolute: 89 cells/uL (ref 0–200)
Basophils Relative: 0.9 %
Eosinophils Absolute: 168 cells/uL (ref 15–500)
Eosinophils Relative: 1.7 %
HCT: 40.9 % (ref 35.0–45.0)
Hemoglobin: 13.4 g/dL (ref 11.7–15.5)
Lymphs Abs: 3119 cells/uL (ref 850–3900)
MCH: 30.2 pg (ref 27.0–33.0)
MCHC: 32.8 g/dL (ref 32.0–36.0)
MCV: 92.1 fL (ref 80.0–100.0)
MPV: 9.2 fL (ref 7.5–12.5)
Monocytes Relative: 5.7 %
Neutro Abs: 5960 cells/uL (ref 1500–7800)
Neutrophils Relative %: 60.2 %
Platelets: 433 10*3/uL — ABNORMAL HIGH (ref 140–400)
RBC: 4.44 10*6/uL (ref 3.80–5.10)
RDW: 13.1 % (ref 11.0–15.0)
Total Lymphocyte: 31.5 %
WBC: 9.9 10*3/uL (ref 3.8–10.8)

## 2021-10-16 LAB — LIPID PANEL W/REFLEX DIRECT LDL
Cholesterol: 203 mg/dL — ABNORMAL HIGH (ref <200)
HDL: 55 mg/dL (ref >=50)
LDL Cholesterol (Calc): 114 mg/dL (calc) — ABNORMAL HIGH
Non-HDL Cholesterol (Calc): 148 mg/dL (calc) — ABNORMAL HIGH (ref <130)
Total CHOL/HDL Ratio: 3.7 (calc) (ref <5.0)
Triglycerides: 223 mg/dL — ABNORMAL HIGH (ref <150)

## 2021-10-16 LAB — VITAMIN D 25 HYDROXY (VIT D DEFICIENCY, FRACTURES): Vit D, 25-Hydroxy: 26 ng/mL — ABNORMAL LOW (ref 30–100)

## 2021-10-16 LAB — TSH: TSH: 1.9 mIU/L

## 2021-10-19 ENCOUNTER — Encounter: Payer: Self-pay | Admitting: Medical-Surgical

## 2021-10-20 ENCOUNTER — Other Ambulatory Visit (HOSPITAL_COMMUNITY): Payer: Self-pay

## 2021-10-22 ENCOUNTER — Other Ambulatory Visit (HOSPITAL_BASED_OUTPATIENT_CLINIC_OR_DEPARTMENT_OTHER): Payer: Self-pay

## 2021-10-22 ENCOUNTER — Other Ambulatory Visit (HOSPITAL_COMMUNITY): Payer: Self-pay

## 2021-10-26 ENCOUNTER — Encounter: Payer: Self-pay | Admitting: Medical-Surgical

## 2021-10-26 ENCOUNTER — Other Ambulatory Visit (HOSPITAL_BASED_OUTPATIENT_CLINIC_OR_DEPARTMENT_OTHER): Payer: Self-pay

## 2021-10-26 MED ORDER — NIRMATRELVIR/RITONAVIR (PAXLOVID)TABLET
3.0000 | ORAL_TABLET | Freq: Two times a day (BID) | ORAL | 0 refills | Status: AC
  Filled 2021-10-26: qty 30, 5d supply, fill #0

## 2021-11-04 ENCOUNTER — Other Ambulatory Visit (HOSPITAL_BASED_OUTPATIENT_CLINIC_OR_DEPARTMENT_OTHER): Payer: Self-pay

## 2021-11-04 MED ORDER — GUANFACINE HCL ER 2 MG PO TB24
ORAL_TABLET | ORAL | 5 refills | Status: DC
Start: 2021-11-04 — End: 2022-10-18
  Filled 2021-11-04 – 2021-11-17 (×2): qty 30, 30d supply, fill #0
  Filled 2021-12-19: qty 30, 30d supply, fill #1
  Filled 2022-01-21: qty 30, 30d supply, fill #2
  Filled 2022-02-17: qty 30, 30d supply, fill #3
  Filled 2022-03-17: qty 30, 30d supply, fill #4
  Filled 2022-04-14: qty 30, 30d supply, fill #5

## 2021-11-17 ENCOUNTER — Encounter: Payer: Self-pay | Admitting: Physical Therapy

## 2021-11-17 ENCOUNTER — Ambulatory Visit: Payer: No Typology Code available for payment source | Attending: Orthopaedic Surgery | Admitting: Physical Therapy

## 2021-11-17 ENCOUNTER — Other Ambulatory Visit (HOSPITAL_BASED_OUTPATIENT_CLINIC_OR_DEPARTMENT_OTHER): Payer: Self-pay

## 2021-11-17 DIAGNOSIS — S93412A Sprain of calcaneofibular ligament of left ankle, initial encounter: Secondary | ICD-10-CM | POA: Diagnosis not present

## 2021-11-17 DIAGNOSIS — R262 Difficulty in walking, not elsewhere classified: Secondary | ICD-10-CM | POA: Insufficient documentation

## 2021-11-17 DIAGNOSIS — M25572 Pain in left ankle and joints of left foot: Secondary | ICD-10-CM | POA: Diagnosis present

## 2021-11-17 DIAGNOSIS — M6281 Muscle weakness (generalized): Secondary | ICD-10-CM | POA: Diagnosis present
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
## 2021-11-19 ENCOUNTER — Emergency Department (HOSPITAL_BASED_OUTPATIENT_CLINIC_OR_DEPARTMENT_OTHER): Payer: No Typology Code available for payment source

## 2021-11-19 ENCOUNTER — Encounter: Payer: Self-pay | Admitting: Medical-Surgical

## 2021-11-19 ENCOUNTER — Other Ambulatory Visit (HOSPITAL_BASED_OUTPATIENT_CLINIC_OR_DEPARTMENT_OTHER): Payer: Self-pay

## 2021-11-19 ENCOUNTER — Ambulatory Visit: Payer: No Typology Code available for payment source | Admitting: Physical Therapy

## 2021-11-19 ENCOUNTER — Encounter (HOSPITAL_BASED_OUTPATIENT_CLINIC_OR_DEPARTMENT_OTHER): Payer: Self-pay | Admitting: Emergency Medicine

## 2021-11-19 ENCOUNTER — Emergency Department (HOSPITAL_BASED_OUTPATIENT_CLINIC_OR_DEPARTMENT_OTHER)
Admission: EM | Admit: 2021-11-19 | Discharge: 2021-11-19 | Disposition: A | Discharge status: Home / Self Care | Payer: No Typology Code available for payment source | Attending: Emergency Medicine | Admitting: Emergency Medicine

## 2021-11-19 ENCOUNTER — Other Ambulatory Visit: Payer: Self-pay

## 2021-11-19 DIAGNOSIS — K5130 Ulcerative (chronic) rectosigmoiditis without complications: Secondary | ICD-10-CM | POA: Diagnosis not present

## 2021-11-19 DIAGNOSIS — Z20822 Contact with and (suspected) exposure to covid-19: Secondary | ICD-10-CM | POA: Diagnosis not present

## 2021-11-19 DIAGNOSIS — K529 Noninfective gastroenteritis and colitis, unspecified: Secondary | ICD-10-CM

## 2021-11-19 DIAGNOSIS — R109 Unspecified abdominal pain: Secondary | ICD-10-CM | POA: Diagnosis present

## 2021-11-19 LAB — CBC
HCT: 41.6 % (ref 36.0–46.0)
Hemoglobin: 13.8 g/dL (ref 12.0–15.0)
MCH: 30.4 pg (ref 26.0–34.0)
MCHC: 33.2 g/dL (ref 30.0–36.0)
MCV: 91.6 fL (ref 80.0–100.0)
Platelets: 421 10*3/uL — ABNORMAL HIGH (ref 150–400)
RBC: 4.54 MIL/uL (ref 3.87–5.11)
RDW: 13.8 % (ref 11.5–15.5)
WBC: 13.1 10*3/uL — ABNORMAL HIGH (ref 4.0–10.5)
nRBC: 0 % (ref 0.0–0.2)

## 2021-11-19 LAB — COMPREHENSIVE METABOLIC PANEL
ALT: 26 U/L (ref 0–44)
AST: 24 U/L (ref 15–41)
Albumin: 4.2 g/dL (ref 3.5–5.0)
Alkaline Phosphatase: 134 U/L — ABNORMAL HIGH (ref 38–126)
Anion gap: 8 (ref 5–15)
BUN: 10 mg/dL (ref 6–20)
CO2: 24 mmol/L (ref 22–32)
Calcium: 8.7 mg/dL — ABNORMAL LOW (ref 8.9–10.3)
Chloride: 106 mmol/L (ref 98–111)
Creatinine, Ser: 0.83 mg/dL (ref 0.44–1.00)
GFR, Estimated: >60 mL/min (ref >60)
Glucose, Bld: 112 mg/dL — ABNORMAL HIGH (ref 70–99)
Potassium: 3.7 mmol/L (ref 3.5–5.1)
Sodium: 138 mmol/L (ref 135–145)
Total Bilirubin: 0.3 mg/dL (ref 0.3–1.2)
Total Protein: 7.8 g/dL (ref 6.5–8.1)

## 2021-11-19 LAB — URINALYSIS, ROUTINE W REFLEX MICROSCOPIC
Bilirubin Urine: NEGATIVE
Glucose, UA: NEGATIVE mg/dL
Hgb urine dipstick: NEGATIVE
Ketones, ur: NEGATIVE mg/dL
Leukocytes,Ua: NEGATIVE
Nitrite: NEGATIVE
Protein, ur: NEGATIVE mg/dL
Specific Gravity, Urine: >=1.03 (ref 1.005–1.030)
pH: 5.5 (ref 5.0–8.0)

## 2021-11-19 LAB — RESP PANEL BY RT-PCR (FLU A&B, COVID) ARPGX2
Influenza A by PCR: NEGATIVE
Influenza B by PCR: NEGATIVE
SARS Coronavirus 2 by RT PCR: NEGATIVE

## 2021-11-19 LAB — PREGNANCY, URINE: Preg Test, Ur: NEGATIVE

## 2021-11-19 LAB — LIPASE, BLOOD: Lipase: 27 U/L (ref 11–51)

## 2021-11-19 MED ORDER — IOHEXOL 300 MG/ML  SOLN
100.0000 mL | Freq: Once | INTRAMUSCULAR | Status: AC | PRN
  Administered 2021-11-19: 100 mL via INTRAVENOUS

## 2021-11-19 MED ORDER — ALUM & MAG HYDROXIDE-SIMETH 200-200-20 MG/5ML PO SUSP
15.0000 mL | Freq: Once | ORAL | Status: AC
  Administered 2021-11-19: 15 mL via ORAL
  Filled 2021-11-19: qty 30

## 2021-11-19 MED ORDER — ONDANSETRON HCL 4 MG/2ML IJ SOLN
4.0000 mg | Freq: Once | INTRAMUSCULAR | Status: AC
  Administered 2021-11-19: 4 mg via INTRAVENOUS
  Filled 2021-11-19: qty 2

## 2021-11-19 MED ORDER — DEXMETHYLPHENIDATE HCL ER 30 MG PO CP24
1.0000 | ORAL_CAPSULE | Freq: Every day | ORAL | 0 refills | Status: DC
  Filled 2021-11-19: qty 30, 30d supply, fill #0

## 2021-11-19 MED ORDER — ONDANSETRON HCL 4 MG PO TABS
4.0000 mg | ORAL_TABLET | Freq: Four times a day (QID) | ORAL | 0 refills | Status: DC
  Filled 2021-11-19: qty 12, 3d supply, fill #0

## 2021-11-19 MED ORDER — METRONIDAZOLE 500 MG PO TABS
500.0000 mg | ORAL_TABLET | Freq: Two times a day (BID) | ORAL | 0 refills | Status: DC
  Filled 2021-11-19: qty 10, 5d supply, fill #0

## 2021-11-19 MED ORDER — DICYCLOMINE HCL 20 MG PO TABS
20.0000 mg | ORAL_TABLET | Freq: Two times a day (BID) | ORAL | 0 refills | Status: DC
  Filled 2021-11-19: qty 20, 10d supply, fill #0

## 2021-11-19 MED ORDER — MORPHINE SULFATE (PF) 4 MG/ML IV SOLN
4.0000 mg | Freq: Once | INTRAVENOUS | Status: AC
  Administered 2021-11-19: 4 mg via INTRAVENOUS
  Filled 2021-11-19: qty 1

## 2021-11-19 MED ORDER — SODIUM CHLORIDE 0.9 % IV BOLUS
1000.0000 mL | Freq: Once | INTRAVENOUS | Status: AC
  Administered 2021-11-19: 1000 mL via INTRAVENOUS

## 2021-11-19 MED ORDER — CIPROFLOXACIN HCL 500 MG PO TABS
500.0000 mg | ORAL_TABLET | Freq: Two times a day (BID) | ORAL | 0 refills | Status: DC
  Filled 2021-11-19: qty 10, 5d supply, fill #0

## 2021-11-19 MED ORDER — DICYCLOMINE HCL 10 MG PO CAPS
10.0000 mg | ORAL_CAPSULE | Freq: Once | ORAL | Status: AC
  Administered 2021-11-19: 10 mg via ORAL
  Filled 2021-11-19: qty 1

## 2021-11-19 NOTE — ED Notes (Signed)
IV paused and BP cuff d/c for the pt to be transported to CT

## 2021-11-19 NOTE — ED Notes (Signed)
Patient transported to CT 

## 2021-11-19 NOTE — ED Provider Notes (Signed)
[facility]PART[facility] Provider Note   CSN: [id number] Arrival date & time: 11/19/21  1458     History  Chief Complaint  Patient presents with   Fever   Abdominal Pain    Tracey Patterson is a 41 y.o. female.   Fever Abdominal Pain Associated symptoms: fever     41 year old female presents emergency department with complaints of abdominal pain.  Patient states that 3 days ago she had generalized body aches.  She then noted a cramping abdominal pain noted in the epigastric region that began 2 days ago.  She notes some associated nausea but no vomiting.  She states she has had diarrhea this morning that was nonbloody in appearance.  She denies melena.  She has 1 episode of fever earlier today with a reported temperature of 102 that broke without medication.  She has been afebrile since.  She has tried no at home medication for her symptoms.  Notes of prior abdominal surgery of tubal ligation.  Denies chills night sweats, shortness of breath, chest pain, back pain, vomiting, urinary/vaginal symptoms.  Denies recent antibiotic use.  Past medical history significant for anxiety, fibromyalgia, GERD, IBS  Home Medications Prior to Admission medications   Medication Sig Start Date End Date Taking? Authorizing Provider  ciprofloxacin (CIPRO) 500 MG tablet Take 1 tablet (500 mg total) by mouth every 12 (twelve) hours. 11/19/21  Yes Dion Saucier A, PA  metroNIDAZOLE (FLAGYL) 500 MG tablet Take 1 tablet (500 mg total) by mouth 2 (two) times daily. 11/19/21  Yes Dion Saucier A, PA  ondansetron (ZOFRAN) 4 MG tablet Take 1 tablet (4 mg total) by mouth every 6 (six) hours. 11/19/21  Yes Dion Saucier A, PA  albuterol (VENTOLIN HFA) 108 (90 Base) MCG/ACT inhaler Inhale 2 puffs into the lungs every 6 (six) hours as needed for wheezing or shortness of breath. 12/21/20   Sharion Balloon, FNP  Azelastine-Fluticasone 137-50 MCG/ACT SUSP Place 1 spray into the nose every 12  (twelve) hours. 05/05/21   [name], NP  buPROPion (WELLBUTRIN XL) 300 MG 24 hr tablet Take 1 tablet (300 mg total) by mouth daily. 07/17/21 07/17/22  [name], NP  cetirizine (ZYRTEC) 10 MG tablet Take 1 tablet (10 mg total) by mouth daily. 09/20/13   Hommel, Hilliard Clark, DO  clonazePAM (KLONOPIN) 0.5 MG tablet Take 1/2 - 1 tablet (0.25-0.5 mg total) by mouth 2 (two) times daily as needed for anxiety. 07/17/21 10/15/21  [name], NP  Dexmethylphenidate HCl 30 MG CP24 Take 1 capsule by mouth daily 11/19/21     DULoxetine (CYMBALTA) 30 MG capsule Take 1 capsule (30 mg total) by mouth daily in the morning. 07/17/21   [name], NP  DULoxetine (CYMBALTA) 60 MG capsule Take 1 capsule (60 mg total) by mouth daily. 10/14/21   [name], NP  gabapentin (NEURONTIN) 300 MG capsule Take 1 capsule (300 mg total) by mouth 3 (three) times daily as needed (nerve pain). 07/20/21   Gregor Hams, MD  guanFACINE (INTUNIV) 2 MG TB24 ER tablet Take 1 tablet by mouth every night 11/04/21     IBUPROFEN PO Take 800 mg by mouth as needed.     [provider]  levonorgestrel (MIRENA, 52 MG,) 20 MCG/DAY IUD 1 Intra Uterine Device by Intrauterine route once.    [provider]  montelukast (SINGULAIR) 10 MG tablet Take 1 tablet (10 mg total) by mouth at bedtime. 10/14/21   [name], NP  Multiple Vitamin tablet Take  1 tablet by mouth daily.    [provider]  pantoprazole (PROTONIX) 40 MG tablet TAKE 1 TABLET (40 MG TOTAL) BY MOUTH 2 (TWO) TIMES DAILY. 10/14/21 10/14/22  [name], NP  QUEtiapine (SEROQUEL) 50 MG tablet Take 1 tablet (50 mg total) by mouth at bedtime. 10/14/21   [name], NP  amitriptyline (ELAVIL) 75 MG tablet Take 1 tablet (75 mg total) by mouth at bedtime as needed for sleep (pain). 04/22/20 05/12/20  [name], NP      Allergies    Otho Darner allergy], Sulfa antibiotics, Zoloft [sertraline hcl], and Other    Review of Systems   Review of Systems  Constitutional:   Positive for fever.  Gastrointestinal:  Positive for abdominal pain.  All other systems reviewed and are negative.   Physical Exam Updated Vital Signs BP 128/83   Pulse 87   Temp 97.9 F (36.6 C) (Oral)   Resp 18   Ht 5' 5"  (1.651 m)   Wt 92.6 kg   SpO2 98%   BMI 33.97 kg/m  Physical Exam Vitals and nursing note reviewed.  Constitutional:      General: She is not in acute distress.    Appearance: She is well-developed.  HENT:     Head: Normocephalic and atraumatic.  Eyes:     Conjunctiva/sclera: Conjunctivae normal.  Cardiovascular:     Rate and Rhythm: Normal rate and regular rhythm.     Heart sounds: No murmur heard. Pulmonary:     Effort: Pulmonary effort is normal. No respiratory distress.     Breath sounds: Normal breath sounds.  Abdominal:     Palpations: Abdomen is soft.     Tenderness: There is abdominal tenderness in the epigastric area. There is no right CVA tenderness, left CVA tenderness or rebound.  Musculoskeletal:        General: No swelling.     Cervical back: Neck supple.  Skin:    General: Skin is warm and dry.     Capillary Refill: Capillary refill takes less than 2 seconds.  Neurological:     Mental Status: She is alert.  Psychiatric:        Mood and Affect: Mood normal.     ED Results / Procedures / Treatments   Labs (all labs ordered are listed, but only abnormal results are displayed) Labs Reviewed  COMPREHENSIVE METABOLIC PANEL - Abnormal; Notable for the following components:      Result Value   Glucose, Bld 112 (*)    Calcium 8.7 (*)    Alkaline Phosphatase 134 (*)    All other components within normal limits  CBC - Abnormal; Notable for the following components:   WBC 13.1 (*)    Platelets 421 (*)    All other components within normal limits  URINALYSIS, ROUTINE W REFLEX MICROSCOPIC - Abnormal; Notable for the following components:   APPearance CLOUDY (*)    All other components within normal limits  RESP PANEL BY RT-PCR (FLU  A&B, COVID) ARPGX2  LIPASE, BLOOD  PREGNANCY, URINE    EKG None  Radiology CT Abdomen Pelvis W Contrast  Result Date: 11/19/2021 CLINICAL DATA:  Pancreatitis. EXAM: CT ABDOMEN AND PELVIS WITH CONTRAST TECHNIQUE: Multidetector CT imaging of the abdomen and pelvis was performed using the standard protocol following bolus administration of intravenous contrast. RADIATION DOSE REDUCTION: This exam was performed according to the departmental dose-optimization program which includes automated exposure control, adjustment of the mA and/or kV according to patient size and/or use  of iterative reconstruction technique. CONTRAST:  132m OMNIPAQUE IOHEXOL 300 MG/ML  SOLN COMPARISON:  CT examination dated June 16, 2017 FINDINGS: Lower chest: No acute abnormality. Hepatobiliary: No focal liver abnormality is seen. No gallstones, gallbladder wall thickening, or biliary dilatation. Pancreas: Unremarkable. No pancreatic ductal dilatation or surrounding inflammatory changes. Spleen: Normal in size without focal abnormality. Adrenals/Urinary Tract: Adrenal glands are unremarkable. Kidneys are normal, without renal calculi, focal lesion, or hydronephrosis. Bladder is unremarkable. Stomach/Bowel: Stomach is within normal limits. Appendix not identified. There is diffuse sigmoid colonic and rectal wall thickening with adjacent inflammatory changes suggesting proctocolitis. No extraluminal free air. No abdominal collection or abscess. Vascular/Lymphatic: No significant vascular findings are present. No enlarged abdominal or pelvic lymph nodes. Reproductive: Uterus and bilateral adnexa are unremarkable. Intrauterine contraceptive device in satisfactory position. Other: No abdominal wall hernia or abnormality. No abdominopelvic ascites. Musculoskeletal: No acute or significant osseous findings. IMPRESSION: 1. Diffuse sigmoid colonic and rectal wall thickening with adjacent fat stranding suggesting proctocolitis. No extraluminal  free air or abdominal collection/abscess. 2. No pancreatic edema or peripancreatic inflammatory changes. 3. Uterus and adnexa are unremarkable. Intrauterine contraceptive device in place. 4. No evidence of nephrolithiasis or hydronephrosis. Above findings were reported to PCullodenover the telephone at approximately 4:50 p.m. on 11/19/2021. Electronically Signed   By: IKeane PoliceD.O.   On: 11/19/2021 16:51    Procedures Procedures    Medications Ordered in ED Medications  sodium chloride 0.9 % bolus 1,000 mL (1,000 mLs Intravenous New Bag/Given 11/19/21 1551)  ondansetron (ZOFRAN) injection 4 mg (4 mg Intravenous Given 11/19/21 1552)  morphine (PF) 4 MG/ML injection 4 mg (4 mg Intravenous Given 11/19/21 1554)  dicyclomine (BENTYL) capsule 10 mg (10 mg Oral Given 11/19/21 1548)  iohexol (OMNIPAQUE) 300 MG/ML solution 100 mL (100 mLs Intravenous Contrast Given 11/19/21 1633)  alum & mag hydroxide-simeth (MAALOX/MYLANTA) 200-200-20 MG/5ML suspension 15 mL (15 mLs Oral Given 11/19/21 1639)    ED Course/ Medical Decision Making/ A&P                           Medical Decision Making Amount and/or Complexity of Data Reviewed Labs: ordered.   This patient presents to the ED for concern of abdominal pain, this involves an extensive number of treatment options, and is a complaint that carries with it a high risk of complications and morbidity.  The differential diagnosis includes gastritis, gastric ulcer, gastroenteritis, pancreatitis, cholecystitis, hepatitis, small bowel obstruction, appendicitis, ovarian torsion, urinary tract infection, pyelonephritis, aortic dissection, abdominal aortic aneurysm, ACS, colitis, proctitis, C. difficile   Co morbidities that complicate the patient evaluation  See above   Additional history obtained:  Additional history obtained from EMR External records from outside source obtained and reviewed including CT abdomen pelvis from 06/16/2017 indicative of wall  thickening of the distal sigmoid colon and rectum, left ovarian cyst, IUD   Lab Tests:  I Ordered, and personally interpreted labs.  The pertinent results include: Mild leukocytosis with a WBC of 13.1.  No evidence of anemia.  Mild elevation in platelets with a value of 421.  Lipase within normal range.  UA insignificant for any acute abnormalities.  No electrolyte abnormalities.  Renal function within normal limits.   Imaging Studies ordered:  I ordered imaging studies including CT abdomen pelvis I independently visualized and interpreted imaging which showed diffuse sigmoid colonic and rectal wall thickening with adjacent fat stranding suggesting proctocolitis.  No extraluminal free air or  abdominal collection/abscess.  IUD placement. I agree with the radiologist interpretation  Cardiac Monitoring: / EKG:  The patient was maintained on a cardiac monitor.  I personally viewed and interpreted the cardiac monitored which showed an underlying rhythm of: Sinus rhythm   Consultations Obtained:  N/a   Problem List / ED Course / Critical interventions / Medication management  Proctitis/colitis I ordered medication including 1 L normal saline for rehydration.  Bentyl for spasmodic type symptoms.  Zofran for nausea, morphine for pain.  Maalox for   Reevaluation of the patient after these medicines showed that the patient improved I have reviewed the patients home medicines and have made adjustments as needed   Social Determinants of Health:  Denies tobacco, illicit drug use   Test / Admission - Considered:  Proctitis/colitis Vitals signs significant for initial blood pressure 140/94.  Blood pressure upon arrival to fluids administered while in emergency department.. Otherwise within normal range and stable throughout visit. Laboratory/imaging studies significant for: See above Patient symptoms likely secondary to proctitis/colitis as found on CT imaging.  Patient to be prescribed  oral antibiotics of Flagyl and Cipro for the next 5 days.  Zofran will be prescribed to take as needed.  Bland/brat diet recommended.  Close follow-up with PCP recommended in 2 to 3 days for reevaluation of symptoms.  Establishment with GI recommended for recurring symptoms of colitis/proctitis.  Treatment plan was discussed with the patient and she acknowledged understanding was agreeable to said plan. Worrisome signs and symptoms were discussed with the patient, and the patient acknowledged understanding to return to the ED if noticed. Patient was stable upon discharge.         Final Clinical Impression(s) / ED Diagnoses Final diagnoses:  Proctocolitis    Rx / DC Orders ED Discharge Orders          Ordered    metroNIDAZOLE (FLAGYL) 500 MG tablet  2 times daily        11/19/21 1654    ciprofloxacin (CIPRO) 500 MG tablet  Every 12 hours        11/19/21 1654    ondansetron (ZOFRAN) 4 MG tablet  Every 6 hours        11/19/21 1654              Wilnette Kales, [place] 11/19/21 1658    Drenda Freeze, MD 11/19/21 2337

## 2021-11-19 NOTE — ED Triage Notes (Signed)
Fever, fatigue and lower abd pain x 2 days. Diarrhea this morning. Complains of bloating and nausea with cramping denies any vomiting.

## 2021-11-19 NOTE — Discharge Instructions (Addendum)
Note the work-up today was overall consistent with colitis/proctitis.  This is inflammation of your large bowel.  This is the same condition he had approximately 3 years ago.  We will treat this with 2 antibiotics Cipro and Flagyl to take as prescribed.  You can use Zofran as needed for nausea.  I put in a GI doctor to follow-up with at your earliest convenience to establish care given this is a recurring issue.  Try to abide by the bland/liquid diet to help get your bowels rest.  Please do not hesitate to return to the emergency department if the worrisome signs and symptoms we discussed become apparent.

## 2021-11-20 ENCOUNTER — Other Ambulatory Visit (HOSPITAL_BASED_OUTPATIENT_CLINIC_OR_DEPARTMENT_OTHER): Payer: Self-pay

## 2021-11-23 ENCOUNTER — Ambulatory Visit: Payer: No Typology Code available for payment source | Admitting: Rehabilitative and Restorative Service Providers"

## 2021-11-24 ENCOUNTER — Other Ambulatory Visit (HOSPITAL_BASED_OUTPATIENT_CLINIC_OR_DEPARTMENT_OTHER): Payer: Self-pay

## 2021-11-25 ENCOUNTER — Ambulatory Visit: Payer: No Typology Code available for payment source | Admitting: Physical Therapy

## 2021-11-25 ENCOUNTER — Encounter: Payer: Self-pay | Admitting: Physical Therapy

## 2021-11-25 ENCOUNTER — Inpatient Hospital Stay: Payer: No Typology Code available for payment source | Admitting: Family Medicine

## 2021-11-25 DIAGNOSIS — M6281 Muscle weakness (generalized): Secondary | ICD-10-CM

## 2021-11-25 DIAGNOSIS — M25572 Pain in left ankle and joints of left foot: Secondary | ICD-10-CM

## 2021-11-25 DIAGNOSIS — R262 Difficulty in walking, not elsewhere classified: Secondary | ICD-10-CM

## 2021-11-25 NOTE — Therapy (Addendum)
OUTPATIENT PHYSICAL THERAPY AND DISCHARGE   Patient Name: Tracey Patterson MRN: [id number] DOB:06-Jun-1980, 41 y.o., female Today's Date: 11/25/2021   PT End of Session - 11/25/21 1141     Visit Number 2    Number of Visits 12    Date for PT Re-Evaluation 12/29/21    PT Start Time 57    PT Stop Time 1140    PT Time Calculation (min) 40 min    Activity Tolerance Patient tolerated treatment well    Behavior During Therapy [facility] for tasks assessed/performed              Past Medical History:  Diagnosis Date   Anxiety    Arthritis    Asthma    Chronic headache    COVID-19 11/2020   Depression    Fibromyalgia    GERD (gastroesophageal reflux disease)    History of TMJ disorder    IBS (irritable bowel syndrome)    Obesity    Pneumonia    Past Surgical History:  Procedure Laterality Date   BACK SURGERY  December 2015   KNEE ARTHROSCOPY  1998   left   LAPAROSCOPIC TUBAL LIGATION  02/28/2012   Procedure: LAPAROSCOPIC TUBAL LIGATION;  Surgeon: Cyril Mourning, MD;  Location: [facility];  Service: Gynecology;  Laterality: Bilateral;   TONSILLECTOMY     Patient Active Problem List   Diagnosis Date Noted   Sprain of calcaneofibular ligament of left ankle 08/18/2021   Asthma exacerbation 02/07/2020   Constipation 07/04/2019   Abdominal pain 07/04/2019   ANA positive 12/30/2017   Fibromyalgia 11/03/2017   Colitis 06/17/2017   Left ovarian cyst 06/17/2017   Left shoulder pain 09/07/2016   Degenerative tear of acetabular labrum of left hip 07/15/2016   Ganglion cyst 02/17/2016   Generalized anxiety disorder 02/17/2016   Recurrent sinusitis 02/17/2016   Vitamin D deficiency 02/15/2014   Asthma, chronic 12/14/2012   GERD (gastroesophageal reflux disease) 12/14/2012   Seasonal allergies 12/14/2012   Anxiety and depression 12/14/2012   Transaminitis 12/14/2012   Myalgia and myositis 10/30/2012   Other malaise and fatigue 10/26/2012   Flushing 10/26/2012   Insomnia  08/13/2012   Stress and adjustment reaction 08/13/2012    REFERRING PROVIDER: Frankey Shown  REFERRING DIAG: sprain of Lt calcaneofibular ligament  THERAPY DIAG:  Pain in left ankle and joints of left foot  Difficulty in walking, not elsewhere classified  Muscle weakness (generalized)  Rationale for Evaluation and Treatment Rehabilitation  ONSET DATE: 07/31/21  SUBJECTIVE:   SUBJECTIVE STATEMENT: Pt was sick with colitis so she has not been feeling well. She has tried to keep her ankle as mobile as possible  PERTINENT HISTORY: Fracture of toes on Lt foot x 2 L4/5 microdiscectomy Lt knee arthroscopic surgery  PAIN:  Are you having pain? Yes: NPRS scale: 1/10 Pain location: Lt lateral ankle/foot Pain description: sore Aggravating factors: standing, walking, stairs Relieving factors: ice, elevation   PATIENT GOALS reduce pain   OBJECTIVE:    LOWER EXTREMITY ROM:  Active ROM Right eval Left eval  Hip flexion    Hip extension    Hip abduction    Hip adduction    Hip internal rotation    Hip external rotation    Knee flexion    Knee extension    Ankle dorsiflexion  5  Ankle plantarflexion  70  Ankle inversion  19  Ankle eversion  4   (Blank rows = not tested)  LOWER EXTREMITY MMT:  MMT Right  eval Left eval  Hip flexion    Hip extension    Hip abduction    Hip adduction    Hip internal rotation    Hip external rotation    Knee flexion    Knee extension    Ankle dorsiflexion  4-/5  Ankle plantarflexion  4-/5  Ankle inversion  3/5  Ankle eversion  3-/5   (Blank rows = not tested)   FUNCTIONAL TESTS:  SLS Rt >15 sec, Lt 5 sec (eval)    TODAY'S TREATMENT: 11/24/21 Nustep L5 x 5 min warm up LEs only  Ankle DF towel stretch 3 x 30 sec Ankle 4 way yellow TB 2 x 10 Toe yoga x 10 with assistance Rocker board x 1 min each A/P and laterally Tandem stance 2 x 30 sec Manual: TC and metatarsal mobs grade 2-3 Ankle PROM DF and inv/ev as  tolerated  Eval: Ankle 4 way yellow TB x 8 Seated DF stretch with towel x 30 sec Towel scrunches x 1 min Toe yoga with assistance x 5   PATIENT EDUCATION:  Education details: PT POC and goals, HEP Person educated: Patient Education method: Customer service manager Education comprehension: verbalized understanding and returned demonstration   HOME EXERCISE PROGRAM: Access Code: [facility] URL: [email or web address] Date: 11/17/2021 Prepared by: Isabelle Course  Exercises - Long Sitting Ankle Eversion with Resistance  - 1 x daily - 7 x weekly - 3 sets - 10 reps - Long Sitting Ankle Plantar Flexion with Resistance  - 1 x daily - 7 x weekly - 3 sets - 10 reps - Long Sitting Ankle Inversion with Resistance  - 1 x daily - 7 x weekly - 3 sets - 10 reps - Long Sitting Ankle Dorsiflexion with Anchored Resistance  - 1 x daily - 7 x weekly - 3 sets - 10 reps - Towel Scrunches  - 1 x daily - 7 x weekly - 3 sets - 10 reps - Toe Yoga - Alternating Great Toe and Lesser Toe Extension  - 1 x daily - 7 x weekly - 3 sets - 10 reps - Long Sitting Calf Stretch with Strap  - 1 x daily - 7 x weekly - 1 sets - 3 reps - 20-30 sec hold  ASSESSMENT:  CLINICAL IMPRESSION: Good tolerance to progression of strengthening and balance exercises. Still some pain with eversion   GOALS: Goals reviewed with patient? Yes    LONG TERM GOALS: Target date: 01/06/2022   Pt will be independent with HEP Baseline:  Goal status: INITIAL  2.  Pt will improve FOTO to >= 61 to demo improved functional mobility Baseline:  Goal status: INITIAL  3.  Pt will improve LT ankle strength to 4+/5 to tolerate standing and walking with decreased pain Baseline:  Goal status: INITIAL  4.  Pt will tolerate stair negotiation with pain <= 1/10 Baseline:  Goal status: INITIAL  5.  Pt will improve SLS on Lt to >15 seconds to demo improved balance Baseline:  Goal status: INITIAL   PLAN: PT FREQUENCY:  2x/week  PT DURATION: 6 weeks  PLANNED INTERVENTIONS: Therapeutic exercises, Therapeutic activity, Neuromuscular re-education, Balance training, Gait training, Patient/Family education, Self Care, Joint mobilization, Stair training, Aquatic Therapy, Dry Needling, Electrical stimulation, Cryotherapy, Moist heat, Taping, Vasopneumatic device, Ionotophoresis 77m/ml Dexamethasone, and Manual therapy  PLAN FOR NEXT SESSION: balance on foam, ankle strength, ROM, stability  PHYSICAL THERAPY DISCHARGE SUMMARY  Visits from Start of Care: 2  Current functional level related to goals /  functional outcomes: Little progress due to only 2 visits   Remaining deficits: See above   Education / Equipment: HEP   Patient agrees to discharge. Patient goals were not met. Patient is being discharged due to not returning since the last visit. Isabelle Course, PT,DPT09/26/231:27 PM   Elleen Coulibaly, PT 11/25/2021, 11:42 AM

## 2021-11-26 ENCOUNTER — Ambulatory Visit (INDEPENDENT_AMBULATORY_CARE_PROVIDER_SITE_OTHER): Payer: No Typology Code available for payment source | Admitting: Family Medicine

## 2021-11-26 VITALS — BP 112/79 | HR 89 | Ht 65.0 in | Wt 205.0 lb

## 2021-11-26 DIAGNOSIS — K529 Noninfective gastroenteritis and colitis, unspecified: Secondary | ICD-10-CM

## 2021-11-26 NOTE — Patient Instructions (Addendum)
-   benefiber fiber supplements  - try elimination diet (start with lactose)  - BRAT

## 2021-11-26 NOTE — Progress Notes (Signed)
Acute Office Visit  Subjective:     Patient ID: Tracey Patterson, female    DOB: 06-25-1980, 41 y.o.   MRN: [id number]  Chief Complaint  Patient presents with   Follow-up    HPI Patient is in today for ED follow up. She was diagnosed with colitis and given a prescription of flagyl and cipro for 5 days. She finished the antibiotics yesterday. She has not had a bowel movement in over a week. She has a decreased appetite. She has a hx of constipation. She is passing gas. She does feel like she has stool backed up in her abdomen. She did a mix of fiber and probiotics. This is her second episode of colitis in the last 3 years. She had a colonoscopy at that time that was negative for pathology. She has an apt with GI in a few weeks.   She has decreased exercise due to an avulsion fracture of her left foot. She is currently doing physical therapy.   Review of Systems  Constitutional:  Negative for chills and fever.  Respiratory:  Negative for cough and shortness of breath.   Cardiovascular:  Negative for chest pain.  Neurological:  Negative for headaches.        Objective:    BP 112/79   Pulse 89   Ht 5' 5"  (1.651 m)   Wt 205 lb (93 kg)   SpO2 98%   BMI 34.11 kg/m    Physical Exam Vitals and nursing note reviewed.  Constitutional:      General: She is not in acute distress.    Appearance: Normal appearance.  HENT:     Head: Normocephalic and atraumatic.     Right Ear: External ear normal.     Left Ear: External ear normal.     Nose: Nose normal.  Eyes:     Conjunctiva/sclera: Conjunctivae normal.  Cardiovascular:     Rate and Rhythm: Normal rate and regular rhythm.  Pulmonary:     Effort: Pulmonary effort is normal.     Breath sounds: Normal breath sounds.  Neurological:     General: No focal deficit present.     Mental Status: She is alert and oriented to person, place, and time.  Psychiatric:        Mood and Affect: Mood normal.        Behavior: Behavior normal.         Thought Content: Thought content normal.        Judgment: Judgment normal.     No results found for any visits on 11/26/21.      Assessment & Plan:   Problem List Items Addressed This Visit       Digestive   Colitis - Primary    - pt much improved from ED visit, no longer having fevers - slight abdominal pain likely due to constipation  - pt has not had a bowel movement in 5 days. Recommend fiber supplementation or miralax if unable to have bm. Pt is passing gas so less likely this is obstruction  - discussed elimination diet  - keep follow up with GI to determine next steps  - gave return precautions as well as ED precautions if she starts running fevers again - gave pt work excuse since she still is not feeling 100% better - did review epic notes from ED as well as imaging and prior notes related to colitis.        No orders of the defined types were placed  in this encounter.   Return if symptoms worsen or fail to improve.  Owens Loffler, DO

## 2021-11-26 NOTE — Assessment & Plan Note (Addendum)
-   pt much improved from ED visit, no longer having fevers - slight abdominal pain likely due to constipation  - pt has not had a bowel movement in 5 days. Recommend fiber supplementation or miralax if unable to have bm. Pt is passing gas so less likely this is obstruction  - discussed elimination diet  - keep follow up with GI to determine next steps  - gave return precautions as well as ED precautions if she starts running fevers again - gave pt work excuse since she still is not feeling 100% better - did review epic notes from ED as well as imaging and prior notes related to colitis.

## 2021-12-01 ENCOUNTER — Encounter: Payer: Self-pay | Admitting: Family Medicine

## 2021-12-01 ENCOUNTER — Encounter: Payer: Self-pay | Admitting: Medical-Surgical

## 2021-12-02 ENCOUNTER — Encounter: Payer: Self-pay | Admitting: Physical Therapy

## 2021-12-02 ENCOUNTER — Ambulatory Visit: Payer: No Typology Code available for payment source | Admitting: Physical Therapy

## 2021-12-04 ENCOUNTER — Other Ambulatory Visit (HOSPITAL_BASED_OUTPATIENT_CLINIC_OR_DEPARTMENT_OTHER): Payer: Self-pay

## 2021-12-07 ENCOUNTER — Other Ambulatory Visit (HOSPITAL_BASED_OUTPATIENT_CLINIC_OR_DEPARTMENT_OTHER): Payer: Self-pay

## 2021-12-08 ENCOUNTER — Ambulatory Visit (INDEPENDENT_AMBULATORY_CARE_PROVIDER_SITE_OTHER): Payer: No Typology Code available for payment source

## 2021-12-08 ENCOUNTER — Ambulatory Visit (INDEPENDENT_AMBULATORY_CARE_PROVIDER_SITE_OTHER): Payer: No Typology Code available for payment source | Admitting: Family Medicine

## 2021-12-08 ENCOUNTER — Other Ambulatory Visit (HOSPITAL_BASED_OUTPATIENT_CLINIC_OR_DEPARTMENT_OTHER): Payer: Self-pay

## 2021-12-08 ENCOUNTER — Encounter: Payer: Self-pay | Admitting: Family Medicine

## 2021-12-08 VITALS — BP 117/75 | HR 96 | Ht 65.0 in | Wt 202.0 lb

## 2021-12-08 DIAGNOSIS — R1031 Right lower quadrant pain: Secondary | ICD-10-CM | POA: Diagnosis not present

## 2021-12-08 DIAGNOSIS — R531 Weakness: Secondary | ICD-10-CM | POA: Diagnosis not present

## 2021-12-08 DIAGNOSIS — M791 Myalgia, unspecified site: Secondary | ICD-10-CM

## 2021-12-08 DIAGNOSIS — R110 Nausea: Secondary | ICD-10-CM | POA: Diagnosis not present

## 2021-12-08 DIAGNOSIS — R630 Anorexia: Secondary | ICD-10-CM

## 2021-12-08 LAB — POCT INFLUENZA A/B
Influenza A, POC: NEGATIVE
Influenza B, POC: NEGATIVE

## 2021-12-08 LAB — POC COVID19 BINAXNOW: SARS Coronavirus 2 Ag: NEGATIVE

## 2021-12-08 MED ORDER — PROMETHAZINE HCL 12.5 MG PO TABS
12.5000 mg | ORAL_TABLET | Freq: Three times a day (TID) | ORAL | 0 refills | Status: DC | PRN
  Filled 2021-12-08: qty 20, 7d supply, fill #0

## 2021-12-08 MED ORDER — IOHEXOL 300 MG/ML  SOLN
100.0000 mL | Freq: Once | INTRAMUSCULAR | Status: AC | PRN
  Administered 2021-12-08: 100 mL via INTRAVENOUS

## 2021-12-08 NOTE — Progress Notes (Signed)
Acute Office Visit  Subjective:     Patient ID: Tracey Patterson, female    DOB: [birth year], 41 y.o.   MRN: [id number]  Chief Complaint  Patient presents with   Follow-up    HPI Patient is in today for sick visit. Pt is still not feeling well. She has no appetite. She tried to eat pancakes this morning and was not able to eat any of it. She has been able to take down premier protein shakes and bananas and bread. She was feeling better last week and then yesterday she had a low grade fever in the afternoon and woke up feeling nauseous and not well. She denies abdominal pain but admits to nausea and not feeling well. She is able to tolerate liquids. Denies vomiting. She did not want to take zofran due to side effect of constipation. She admits to muscle aches. Denies diarrhea. She is passing gas. Has had weight loss of 3 lbs.   Review of Systems  Constitutional:  Positive for malaise/fatigue and weight loss. Negative for chills and fever.  Respiratory:  Negative for cough and shortness of breath.   Cardiovascular:  Negative for chest pain.  Gastrointestinal:  Positive for nausea. Negative for abdominal pain, constipation and diarrhea.  Neurological:  Negative for headaches.        Objective:    BP 117/75   Pulse 96   Ht 5' 5"  (1.651 m)   Wt 202 lb (91.6 kg)   SpO2 100%   BMI 33.61 kg/m    Physical Exam Vitals and nursing note reviewed.  Constitutional:      General: She is not in acute distress.    Appearance: Normal appearance.  HENT:     Head: Normocephalic and atraumatic.     Right Ear: External ear normal.     Left Ear: External ear normal.     Nose: Nose normal.  Eyes:     Conjunctiva/sclera: Conjunctivae normal.  Cardiovascular:     Rate and Rhythm: Normal rate and regular rhythm.  Pulmonary:     Effort: Pulmonary effort is normal.     Breath sounds: Normal breath sounds.  Abdominal:     General: Abdomen is flat. Bowel sounds are normal.     Palpations:  Abdomen is soft.     Tenderness: There is abdominal tenderness (in RLQ).     Comments: Negative psoas sign. Positive McBurney's sign.  Neurological:     General: No focal deficit present.     Mental Status: She is alert and oriented to person, place, and time.  Psychiatric:        Mood and Affect: Mood normal.        Behavior: Behavior normal.        Thought Content: Thought content normal.        Judgment: Judgment normal.     Results for orders placed or performed in visit on 12/08/21  POCT Influenza A/B  Result Value Ref Range   Influenza A, POC Negative Negative   Influenza B, POC Negative Negative      Assessment & Plan:   Problem List Items Addressed This Visit       Other   Nausea - Primary    - given phenergan since pt is worried about constipation with zofran       Relevant Medications   promethazine (PHENERGAN) 12.5 MG tablet   Other Relevant Orders   POC COVID-19   POCT Influenza A/B (Completed)   Myalgia    -  CBC, CMP ordered  - flu and covid ordered and interpreted by myself.  Flu is negative.  covid negative      Relevant Orders   POC COVID-19   POCT Influenza A/B (Completed)   Lack of appetite    - pt has had 3 lb weight loss  - encouraged hydration and protein shakes  - she has an apt with GI in two weeks about her previous dx of colitis. She is not having abdominal pain and has been passing gas so less likely this is colitis or bowel obstruction      Relevant Orders   CBC   COMPLETE METABOLIC PANEL WITH GFR   POC COVID-19   POCT Influenza A/B (Completed)   RLQ abdominal pain    - positive McBurney's point. Will go ahead and order CT Abdomen to rule out appendicitis - if CT abdomen is negative for appendicitis may need to see if she can get into GI sooner since a lot of her issues are focused in the abdominal region. Wondering if this is also a viral infection with her added fatigue      Relevant Orders   POC COVID-19   POCT Influenza  A/B (Completed)   CT Abdomen Pelvis W Contrast   Other Visit Diagnoses     Weak       Relevant Orders   CBC   COMPLETE METABOLIC PANEL WITH GFR   POC COVID-19   POCT Influenza A/B (Completed)       Meds ordered this encounter  Medications   promethazine (PHENERGAN) 12.5 MG tablet    Sig: Take 1 tablet (12.5 mg total) by mouth every 8 (eight) hours as needed for nausea or vomiting.    Dispense:  20 tablet    Refill:  0    Return if symptoms worsen or fail to improve.  Owens Loffler, DO

## 2021-12-08 NOTE — Assessment & Plan Note (Addendum)
-   CBC, CMP ordered  - flu and covid ordered and interpreted by myself.  Flu is negative.  covid negative

## 2021-12-08 NOTE — Assessment & Plan Note (Addendum)
-   positive McBurney's point. Will go ahead and order CT Abdomen to rule out appendicitis - if CT abdomen is negative for appendicitis may need to see if she can get into GI sooner since a lot of her issues are focused in the abdominal region. Wondering if this is also a viral infection with her added fatigue

## 2021-12-08 NOTE — Assessment & Plan Note (Signed)
-   have ordered Flu and COVID testing - both tests interpreted by myself:

## 2021-12-08 NOTE — Assessment & Plan Note (Signed)
-   given phenergan since pt is worried about constipation with zofran

## 2021-12-08 NOTE — Assessment & Plan Note (Signed)
-   pt has had 3 lb weight loss  - encouraged hydration and protein shakes  - she has an apt with GI in two weeks about her previous dx of colitis. She is not having abdominal pain and has been passing gas so less likely this is colitis or bowel obstruction

## 2021-12-09 LAB — COMPLETE METABOLIC PANEL WITH GFR
AG Ratio: 2 (calc) (ref 1.0–2.5)
ALT: 16 U/L (ref 6–29)
AST: 13 U/L (ref 10–30)
Albumin: 4.5 g/dL (ref 3.6–5.1)
Alkaline phosphatase (APISO): 128 U/L — ABNORMAL HIGH (ref 31–125)
BUN: 8 mg/dL (ref 7–25)
CO2: 27 mmol/L (ref 20–32)
Calcium: 9.5 mg/dL (ref 8.6–10.2)
Chloride: 106 mmol/L (ref 98–110)
Creat: 0.75 mg/dL (ref 0.50–0.99)
Globulin: 2.3 g/dL (calc) (ref 1.9–3.7)
Glucose, Bld: 77 mg/dL (ref 65–99)
Potassium: 4.4 mmol/L (ref 3.5–5.3)
Sodium: 140 mmol/L (ref 135–146)
Total Bilirubin: 0.3 mg/dL (ref 0.2–1.2)
Total Protein: 6.8 g/dL (ref 6.1–8.1)
eGFR: 101 mL/min/{1.73_m2} (ref >=60)

## 2021-12-09 LAB — CBC
HCT: 38.5 % (ref 35.0–45.0)
Hemoglobin: 12.9 g/dL (ref 11.7–15.5)
MCH: 30.3 pg (ref 27.0–33.0)
MCHC: 33.5 g/dL (ref 32.0–36.0)
MCV: 90.4 fL (ref 80.0–100.0)
MPV: 9.7 fL (ref 7.5–12.5)
Platelets: 472 10*3/uL — ABNORMAL HIGH (ref 140–400)
RBC: 4.26 10*6/uL (ref 3.80–5.10)
RDW: 13.5 % (ref 11.0–15.0)
WBC: 9.2 10*3/uL (ref 3.8–10.8)

## 2021-12-10 ENCOUNTER — Encounter: Payer: Self-pay | Admitting: Family Medicine

## 2021-12-16 ENCOUNTER — Telehealth: Payer: Self-pay

## 2021-12-16 NOTE — Telephone Encounter (Signed)
Will leave paperwork at the front please accept payment and pick up

## 2021-12-21 ENCOUNTER — Other Ambulatory Visit (HOSPITAL_BASED_OUTPATIENT_CLINIC_OR_DEPARTMENT_OTHER): Payer: Self-pay
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
## 2021-12-23 ENCOUNTER — Other Ambulatory Visit: Payer: No Typology Code available for payment source

## 2021-12-23 ENCOUNTER — Encounter: Payer: Self-pay | Admitting: Physician Assistant

## 2021-12-23 ENCOUNTER — Ambulatory Visit (INDEPENDENT_AMBULATORY_CARE_PROVIDER_SITE_OTHER): Payer: No Typology Code available for payment source | Admitting: Physician Assistant

## 2021-12-23 ENCOUNTER — Other Ambulatory Visit (HOSPITAL_BASED_OUTPATIENT_CLINIC_OR_DEPARTMENT_OTHER): Payer: Self-pay

## 2021-12-23 VITALS — BP 138/98 | HR 111 | Ht 65.0 in | Wt 198.0 lb

## 2021-12-23 DIAGNOSIS — K5904 Chronic idiopathic constipation: Secondary | ICD-10-CM | POA: Diagnosis not present

## 2021-12-23 DIAGNOSIS — K219 Gastro-esophageal reflux disease without esophagitis: Secondary | ICD-10-CM

## 2021-12-23 DIAGNOSIS — R1314 Dysphagia, pharyngoesophageal phase: Secondary | ICD-10-CM | POA: Diagnosis not present

## 2021-12-23 DIAGNOSIS — K529 Noninfective gastroenteritis and colitis, unspecified: Secondary | ICD-10-CM

## 2021-12-23 MED ORDER — LINACLOTIDE 290 MCG PO CAPS
290.0000 ug | ORAL_CAPSULE | Freq: Every day | ORAL | 0 refills | Status: DC
  Filled 2021-12-23: qty 90, 90d supply, fill #0

## 2021-12-23 NOTE — Patient Instructions (Addendum)
Your provider has requested that you go to the basement level for lab work before leaving today. Press "B" on the elevator. The lab is located at the first door on the left as you exit the elevator.  You have been scheduled for an appointment with Dr. [name] on 02/16/22 at 3:40 pm . Please arrive 10 minutes early for your appointment.   You have been scheduled for a Barium Esophogram at [facility] (1st floor of the hospital) on 12/30/21 at 1030 am. Please arrive 15 minutes prior to your appointment for registration. Make certain not to have anything to eat or drink 3 hours prior to your test. If you need to reschedule for any reason, please contact radiology at [phone] to do so. __________________________________________________________________ A barium swallow is an examination that concentrates on views of the esophagus. This tends to be a double contrast exam (barium and two liquids which, when combined, create a gas to distend the wall of the oesophagus) or single contrast (non-ionic iodine based). The study is usually tailored to your symptoms so a good history is essential. Attention is paid during the study to the form, structure and configuration of the esophagus, looking for functional disorders (such as aspiration, dysphagia, achalasia, motility and reflux) EXAMINATION You may be asked to change into a gown, depending on the type of swallow being performed. A radiologist and radiographer will perform the procedure. The radiologist will advise you of the type of contrast selected for your procedure and direct you during the exam. You will be asked to stand, sit or lie in several different positions and to hold a small amount of fluid in your mouth before being asked to swallow while the imaging is performed .In some instances you may be asked to swallow barium coated marshmallows to assess the motility of a solid food bolus. The exam can be recorded as a digital or video fluoroscopy  procedure. POST PROCEDURE It will take 1-2 days for the barium to pass through your system. To facilitate this, it is important, unless otherwise directed, to increase your fluids for the next 24-48hrs and to resume your normal diet.  This test typically takes about 30 minutes to perform. __________________________________________________________________________________   Tracey Patterson  *IBS-C patients may begin to experience relief from belly pain and overall abdominal symptoms (pain, discomfort, and bloating) in about 1 week,  with symptoms typically improving over 12 weeks.  Take at least 30 minutes before the first meal of the day on an empty stomach You can have a loose stool if you eat a high-fat breakfast. Give it at least 7 days, may have more bowel movements during that time.   The diarrhea should go away and you should start having normal, complete, full bowel movements.  It may be helpful to start treatment when you can be near the comfort of your own bathroom, such as a weekend.  After you are out we can send in a prescription if you did well, there is a prescription card  IF NOT BETTER CAN CONSIDER EGD CAN CONSIDER MOTEGRITY  Please take your proton pump inhibitor medication, increase to two a day for 1 month at least  Please take this medication 30 minutes to 1 hour before meals- this makes it more effective.  Avoid spicy and acidic foods Avoid fatty foods Limit your intake of coffee, tea, alcohol, and carbonated drinks Work to maintain a healthy weight Keep the head of the bed elevated at least 3 inches with blocks or a  wedge pillow if you are having any nighttime symptoms Stay upright for 2 hours after eating Avoid meals and snacks three to four hours before bedtime  Gastroparesis Please do small frequent meals like 4-6 meals a day.  Eat and drink liquids at separate times.  Avoid high fiber foods, cook your vegetables, avoid high fat food.  Suggest spreading protein  throughout the day (greek yogurt, glucerna, soft meat, milk, eggs) Choose soft foods that you can mash with a fork When you are more symptomatic, change to pureed foods foods and liquids.  Consider reading "Living well with Gastroparesis" by Lambert Keto Gastroparesis is a condition in which food takes longer than normal to empty from the stomach. This condition is also known as delayed gastric emptying. It is usually a long-term (chronic) condition. There is no cure, but there are treatments and things that you can do at home to help relieve symptoms. Treating the underlying condition that causes gastroparesis can also help relieve symptoms What are the causes? In many cases, the cause of this condition is not known. Possible causes include: A hormone (endocrine) disorder, such as hypothyroidism or diabetes. A nervous system disease, such as Parkinson's disease or multiple sclerosis. Cancer, infection, or surgery that affects the stomach or vagus nerve. The vagus nerve runs from your chest, through your neck, and to the lower part of your brain. A connective tissue disorder, such as scleroderma. Certain medicines. What increases the risk? You are more likely to develop this condition if: You have certain disorders or diseases. These may include: An endocrine disorder. An eating disorder. Amyloidosis. Scleroderma. Parkinson's disease. Multiple sclerosis. Cancer or infection of the stomach or the vagus nerve. You have had surgery on your stomach or vagus nerve. You take certain medicines. You are female. What are the signs or symptoms? Symptoms of this condition include: Feeling full after eating very little or a loss of appetite. Nausea, vomiting, or heartburn. Bloating of your abdomen. Inconsistent blood sugar (glucose) levels on blood tests. Unexplained weight loss. Acid from the stomach coming up into the esophagus (gastroesophageal reflux). Sudden tightening (spasm) of  the stomach, which can be painful. Symptoms may come and go. Some people may not notice any symptoms. How is this diagnosed? This condition is diagnosed with tests, such as: Tests that check how long it takes food to move through the stomach and intestines. These tests include: Upper gastrointestinal (GI) series. For this test, you drink a liquid that shows up well on X-rays, and then X-rays are taken of your intestines. Gastric emptying scintigraphy. For this test, you eat food that contains a small amount of radioactive material, and then scans are taken. Wireless capsule GI monitoring system. For this test, you swallow a pill (capsule) that records information about how foods and fluid move through your stomach. Gastric manometry. For this test, a tube is passed down your throat and into your stomach to measure electrical and muscular activity. Endoscopy. For this test, a long, thin tube with a camera and light on the end is passed down your throat and into your stomach to check for problems in your stomach lining. Ultrasound. This test uses sound waves to create images of the inside of your body. This can help rule out gallbladder disease or pancreatitis as a cause of your symptoms. How is this treated? There is no cure for this condition, but treatment and home care may relieve symptoms. Treatment may include: Treating the underlying cause. Managing your symptoms by making changes  to your diet and exercise habits. Taking medicines to control nausea and vomiting and to stimulate stomach muscles. Getting food through a feeding tube in the hospital. This may be done in severe cases. Having surgery to insert a device called a gastric electrical stimulator into your body. This device helps improve stomach emptying and control nausea and vomiting. Follow these instructions at home: Take over-the-counter and prescription medicines only as told by your health care provider. Follow instructions from  your health care provider about eating or drinking restrictions. Your health care provider may recommend that you: Eat smaller meals more often. Eat low-fat foods. Eat low-fiber forms of high-fiber foods. For example, eat cooked vegetables instead of raw vegetables. Have only liquid foods instead of solid foods. Liquid foods are easier to digest. Drink enough fluid to keep your urine pale yellow. Exercise as often as told by your health care provider. Keep all follow-up visits. This is important. Contact a health care provider if you: Notice that your symptoms do not improve with treatment. Have new symptoms. Get help right away if you: Have severe pain in your abdomen that does not improve with treatment. Have nausea that is severe or does not go away. Vomit every time you drink fluids. Summary Gastroparesis is a long-term (chronic) condition in which food takes longer than normal to empty from the stomach. Symptoms include nausea, vomiting, heartburn, bloating of your abdomen, and loss of appetite. Eating smaller portions, low-fat foods, and low-fiber forms of high-fiber foods may help you manage your symptoms. Get help right away if you have severe pain in your abdomen. This information is not intended to replace advice given to you by your health care provider. Make sure you discuss any questions you have with your health care provider. Document Revised: 07/23/2019 Document Reviewed: 07/23/2019 Elsevier Patient Education  2021 [employer].

## 2021-12-23 NOTE — Progress Notes (Signed)
Agree with assessment/plan.  Carmell Austria, MD [facility] [phone]

## 2021-12-24 LAB — TISSUE TRANSGLUTAMINASE, IGA: (tTG) Ab, IgA: <1 U/mL

## 2021-12-24 LAB — IGA: Immunoglobulin A: 207 mg/dL (ref 47–310)

## 2021-12-25 ENCOUNTER — Other Ambulatory Visit (HOSPITAL_BASED_OUTPATIENT_CLINIC_OR_DEPARTMENT_OTHER): Payer: Self-pay

## 2021-12-25 MED ORDER — DEXMETHYLPHENIDATE HCL ER 30 MG PO CP24
1.0000 | ORAL_CAPSULE | Freq: Every day | ORAL | 0 refills | Status: DC
  Filled 2022-01-06 (×2): qty 30, 30d supply, fill #0

## 2021-12-28 ENCOUNTER — Ambulatory Visit (HOSPITAL_COMMUNITY)
Admission: RE | Admit: 2021-12-28 | Discharge: 2021-12-28 | Disposition: A | Discharge status: Home / Self Care | Payer: No Typology Code available for payment source | Source: Ambulatory Visit | Attending: Physician Assistant | Admitting: Physician Assistant

## 2021-12-28 DIAGNOSIS — R1314 Dysphagia, pharyngoesophageal phase: Secondary | ICD-10-CM

## 2021-12-29 ENCOUNTER — Telehealth: Payer: Self-pay

## 2021-12-29 ENCOUNTER — Other Ambulatory Visit: Payer: Self-pay

## 2021-12-29 DIAGNOSIS — K219 Gastro-esophageal reflux disease without esophagitis: Secondary | ICD-10-CM

## 2021-12-29 DIAGNOSIS — R1314 Dysphagia, pharyngoesophageal phase: Secondary | ICD-10-CM

## 2021-12-29 NOTE — Telephone Encounter (Signed)
Patient has been scheduled for EGD 01/05/22 at 10:30 am in the [facility] with Dr. [name]. Amb ref placed. No blood thinners or diabetes. Instructions provided on the phone and sent to [facility] as well. Pt advised to call back with any further questions.

## 2021-12-30 ENCOUNTER — Other Ambulatory Visit (HOSPITAL_COMMUNITY): Payer: No Typology Code available for payment source

## 2022-01-05 ENCOUNTER — Other Ambulatory Visit (HOSPITAL_BASED_OUTPATIENT_CLINIC_OR_DEPARTMENT_OTHER): Payer: Self-pay

## 2022-01-05 ENCOUNTER — Ambulatory Visit (AMBULATORY_SURGERY_CENTER): Payer: No Typology Code available for payment source | Admitting: Gastroenterology

## 2022-01-05 ENCOUNTER — Encounter: Payer: Self-pay | Admitting: Gastroenterology

## 2022-01-05 VITALS — BP 121/71 | HR 86 | Temp 97.5°F | Resp 12 | Ht 65.0 in | Wt 198.0 lb

## 2022-01-05 DIAGNOSIS — K2289 Other specified disease of esophagus: Secondary | ICD-10-CM | POA: Diagnosis not present

## 2022-01-05 DIAGNOSIS — R1314 Dysphagia, pharyngoesophageal phase: Secondary | ICD-10-CM

## 2022-01-05 DIAGNOSIS — K219 Gastro-esophageal reflux disease without esophagitis: Secondary | ICD-10-CM | POA: Diagnosis present

## 2022-01-05 DIAGNOSIS — K2970 Gastritis, unspecified, without bleeding: Secondary | ICD-10-CM

## 2022-01-05 DIAGNOSIS — K317 Polyp of stomach and duodenum: Secondary | ICD-10-CM | POA: Diagnosis not present

## 2022-01-05 MED ORDER — SODIUM CHLORIDE 0.9 % IV SOLN: 500.0000 mL | Freq: Once | INTRAVENOUS | Status: DC

## 2022-01-05 MED ORDER — PANTOPRAZOLE SODIUM 40 MG PO TBEC
40.0000 mg | DELAYED_RELEASE_TABLET | Freq: Two times a day (BID) | ORAL | 3 refills | Status: DC
  Filled 2022-01-05 – 2022-05-18 (×2): qty 90, 45d supply, fill #0
  Filled 2022-08-27: qty 90, 45d supply, fill #1
  Filled 2022-11-19: qty 90, 45d supply, fill #2
  Filled 2022-12-24 – 2022-12-29 (×3): qty 90, 45d supply, fill #3

## 2022-01-05 NOTE — Progress Notes (Signed)
Called to room to assist during endoscopic procedure.  Patient ID and intended procedure confirmed with present staff. Received instructions for my participation in the procedure from the performing physician.  

## 2022-01-05 NOTE — Patient Instructions (Addendum)
- Patient has a contact number available for emergencies. The signs and symptoms of potential delayed complications were discussed with the patient. Return to normal activities tomorrow. Written discharge instructions were provided to the patient. - Post dilatation diet. - Continue present medications including Protonix 40 mg p.o. twice daily x 2 weeks, then can reduce it to once a day. - Await pathology results. - No aspirin, ibuprofen, naproxen, or other non-steroidal anti-inflammatory drugs. - The findings and recommendations were discussed with the patient's family. - If any further problems, solid-phase gastric emptying scan followed by esophageal manometry. Otherwise conservative management.  Handout on Post dilation diet given.  YOU HAD AN ENDOSCOPIC PROCEDURE TODAY AT [facility]:   Refer to the procedure report that was given to you for any specific questions about what was found during the examination.  If the procedure report does not answer your questions, please call your gastroenterologist to clarify.  If you requested that your care partner not be given the details of your procedure findings, then the procedure report has been included in a sealed envelope for you to review at your convenience later.  YOU SHOULD EXPECT: Some feelings of bloating in the abdomen. Passage of more gas than usual.  Walking can help get rid of the air that was put into your GI tract during the procedure and reduce the bloating. If you had a lower endoscopy (such as a colonoscopy or flexible sigmoidoscopy) you may notice spotting of blood in your stool or on the toilet paper. If you underwent a bowel prep for your procedure, you may not have a normal bowel movement for a few days.  Please Note:  You might notice some irritation and congestion in your nose or some drainage.  This is from the oxygen used during your procedure.  There is no need for concern and it should clear up in a day or  so.  SYMPTOMS TO REPORT IMMEDIATELY:  Following upper endoscopy (EGD)  Vomiting of blood or coffee ground material  New chest pain or pain under the shoulder blades  Painful or persistently difficult swallowing  New shortness of breath  Fever of 100F or higher  Black, tarry-looking stools  For urgent or emergent issues, a gastroenterologist can be reached at any hour by calling [phone]. Do not use MyChart messaging for urgent concerns.    DIET:  We do recommend a small meal at first, but then you may proceed to your regular diet.  Drink plenty of fluids but you should avoid alcoholic beverages for 24 hours.  ACTIVITY:  You should plan to take it easy for the rest of today and you should NOT DRIVE or use heavy machinery until tomorrow (because of the sedation medicines used during the test).    FOLLOW UP: Our staff will call the number listed on your records the next business day following your procedure.  We will call around 7:15- 8:00 am to check on you and address any questions or concerns that you may have regarding the information given to you following your procedure. If we do not reach you, we will leave a message.     If any biopsies were taken you will be contacted by phone or by letter within the next 1-3 weeks.  Please call us at [phone] if you have not heard about the biopsies in 3 weeks.    SIGNATURES/CONFIDENTIALITY: You and/or your care partner have signed paperwork which will be entered into your electronic medical  record.  These signatures attest to the fact that that the information above on your After Visit Summary has been reviewed and is understood.  Full responsibility of the confidentiality of this discharge information lies with you and/or your care-partner.

## 2022-01-05 NOTE — Progress Notes (Signed)
Vitals-CW  Pt's states no medical or surgical changes since previsit or office visit. 
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
## 2022-01-05 NOTE — Op Note (Signed)
[place] Patient Name: Tracey Patterson Procedure Date: 01/05/2022 10:05 AM MRN: [id number] Endoscopist: Jackquline Denmark , MD Age: 41 Referring MD:  Date of Birth: 1981/01/20 Gender: Female Account #: [id number] Procedure:                Upper GI endoscopy Indications:              Dysphagia with barium swallow showing mild                            esophageal dysmotility Medicines:                Monitored Anesthesia Care Procedure:                Pre-Anesthesia Assessment:                           - Prior to the procedure, a History and Physical                            was performed, and patient medications and                            allergies were reviewed. The patient's tolerance of                            previous anesthesia was also reviewed. The risks                            and benefits of the procedure and the sedation                            options and risks were discussed with the patient.                            All questions were answered, and informed consent                            was obtained. Prior Anticoagulants: The patient has                            taken no previous anticoagulant or antiplatelet                            agents. ASA Grade Assessment: II - A patient with                            mild systemic disease. After reviewing the risks                            and benefits, the patient was deemed in                            satisfactory condition to undergo the procedure.  After obtaining informed consent, the endoscope was                            passed under direct vision. Throughout the                            procedure, the patient's blood pressure, pulse, and                            oxygen saturations were monitored continuously. The                            Endoscope was introduced through the mouth, and                            advanced to the second part of  duodenum. The upper                            GI endoscopy was accomplished without difficulty.                            The patient tolerated the procedure well. Scope In: Scope Out: Findings:                 The lower third of the esophagus was mildly                            tortuous but normal. No strictures. Biopsies were                            obtained from the proximal and distal esophagus                            with cold forceps for histology to r/o eosinophilic                            esophagitis.                           The Z-line was regular and was found 36 cm from the                            incisors. The scope was withdrawn. Dilation was                            performed with a Maloney dilator with mild                            resistance at 50 Fr and 52 Fr.                           Localized mild inflammation was found in the  gastric antrum. Biopsies were taken with a cold                            forceps for histology. Some retained food in the                            stomach without outlet obstruction.                           Multiple (8-10) 2 to 4 mm sessile polyps with no                            bleeding and no stigmata of recent bleeding were                            found in the gastric body. Three polyps were                            removed with a cold biopsy forceps. Resection and                            retrieval were complete.                           The examined duodenum was normal. Biopsies for                            histology were taken with a cold forceps for                            evaluation of celiac disease. Complications:            No immediate complications. Estimated Blood Loss:     Estimated blood loss: none. Impression:               - Mildly tortuous esophagus. Dilated.                           - Gastritis. Biopsied.                           - Multiple gastric  polyps. Resected and retrieved x                            3.                           - Normal examined duodenum. Biopsied. Recommendation:           - Patient has a contact number available for                            emergencies. The signs and symptoms of potential                            delayed complications were discussed with the  patient. Return to normal activities tomorrow.                            Written discharge instructions were provided to the                            patient.                           - Post dilatation diet.                           - Continue present medications including Protonix                            40 mg p.o. twice daily x 2 weeks, then can reduce                            it to once a day.                           - Await pathology results.                           - No aspirin, ibuprofen, naproxen, or other                            non-steroidal anti-inflammatory drugs.                           - The findings and recommendations were discussed                            with the patient's family.                           - If any further problems, solid-phase gastric                            emptying scan followed by esophageal manometry.                            Otherwise conservative management. Jackquline Denmark, MD 01/05/2022 10:38:51 AM This report has been signed electronically.

## 2022-01-05 NOTE — Progress Notes (Signed)
Report to PACU, RN, vss, BBS= Clear.  

## 2022-01-06 ENCOUNTER — Telehealth: Payer: Self-pay

## 2022-01-06 ENCOUNTER — Other Ambulatory Visit (HOSPITAL_BASED_OUTPATIENT_CLINIC_OR_DEPARTMENT_OTHER): Payer: Self-pay

## 2022-01-06 ENCOUNTER — Other Ambulatory Visit (HOSPITAL_COMMUNITY): Payer: Self-pay

## 2022-01-06 ENCOUNTER — Encounter: Payer: Self-pay | Admitting: Gastroenterology

## 2022-01-06 ENCOUNTER — Telehealth: Payer: Self-pay | Admitting: Gastroenterology

## 2022-01-06 NOTE — Telephone Encounter (Signed)
LEFT MESSAGE ON ANSWERING MACHINE.

## 2022-01-06 NOTE — Telephone Encounter (Signed)
Patient is returning your call.  

## 2022-01-06 NOTE — Telephone Encounter (Signed)
Pt stated that she had an EGD with dilation yesterday and today that she feels her throat is sore and some soreness in the epigastric area: Pt stated that she has no chest pain nor shortness of breath: Pt was notified that this soreness if from the scope  from the EGD and the dilation. Pt was notified that this should resolve in the next several days and to call our office back on Friday if no improvement:  Pt verbalized understanding with all questions answered.

## 2022-01-07 ENCOUNTER — Other Ambulatory Visit (HOSPITAL_BASED_OUTPATIENT_CLINIC_OR_DEPARTMENT_OTHER): Payer: Self-pay

## 2022-01-08 ENCOUNTER — Other Ambulatory Visit (HOSPITAL_BASED_OUTPATIENT_CLINIC_OR_DEPARTMENT_OTHER): Payer: Self-pay

## 2022-01-10 ENCOUNTER — Encounter: Payer: Self-pay | Admitting: Gastroenterology

## 2022-01-12 ENCOUNTER — Ambulatory Visit (INDEPENDENT_AMBULATORY_CARE_PROVIDER_SITE_OTHER): Payer: No Typology Code available for payment source | Admitting: Medical-Surgical

## 2022-01-12 ENCOUNTER — Encounter: Payer: Self-pay | Admitting: Medical-Surgical

## 2022-01-12 ENCOUNTER — Other Ambulatory Visit (HOSPITAL_BASED_OUTPATIENT_CLINIC_OR_DEPARTMENT_OTHER): Payer: Self-pay

## 2022-01-12 VITALS — BP 118/81 | HR 92 | Resp 20 | Ht 65.0 in | Wt 192.0 lb

## 2022-01-12 DIAGNOSIS — F5101 Primary insomnia: Secondary | ICD-10-CM

## 2022-01-12 DIAGNOSIS — R110 Nausea: Secondary | ICD-10-CM | POA: Diagnosis not present

## 2022-01-12 DIAGNOSIS — F339 Major depressive disorder, recurrent, unspecified: Secondary | ICD-10-CM | POA: Diagnosis not present

## 2022-01-12 DIAGNOSIS — K581 Irritable bowel syndrome with constipation: Secondary | ICD-10-CM | POA: Diagnosis not present

## 2022-01-12 DIAGNOSIS — F411 Generalized anxiety disorder: Secondary | ICD-10-CM | POA: Diagnosis not present

## 2022-01-12 MED ORDER — QUETIAPINE FUMARATE 100 MG PO TABS
100.0000 mg | ORAL_TABLET | Freq: Every day | ORAL | 1 refills | Status: DC
  Filled 2022-01-12: qty 90, 90d supply, fill #0

## 2022-01-12 MED ORDER — METOCLOPRAMIDE HCL 10 MG PO TABS
10.0000 mg | ORAL_TABLET | Freq: Four times a day (QID) | ORAL | 0 refills | Status: AC | PRN
  Filled 2022-01-12: qty 120, 30d supply, fill #0
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
## 2022-01-14 ENCOUNTER — Encounter: Payer: Self-pay | Admitting: Gastroenterology

## 2022-01-20 NOTE — Telephone Encounter (Signed)
Chart reviewed I also reviewed previous endoscopy October 2023, colonoscopy 10/2019, recent C[facility] Labs were reviewed as well.  She had negative celiac serology, normal TSH  Plan: -Check CBC, CMP, CRP -Please check stool for fecal calprotectin, elastase -Get [place] abdo complete. (For Abdo pain) -Also get x-ray KUB 2 view to check for stool burden and for Abdo pain. -Can stop Linzess.  Start Motegrity 2 mg p.o. daily -Has the Reglan helped? -If still with problems, trial of gluten-free diet x 2 weeks.  She does not have celiac but may have gluten sensitivity. -If still with problems, FU with Estill Bamberg.  May require solid-phase GES.

## 2022-01-21 ENCOUNTER — Other Ambulatory Visit: Payer: Self-pay

## 2022-01-21 ENCOUNTER — Other Ambulatory Visit (HOSPITAL_BASED_OUTPATIENT_CLINIC_OR_DEPARTMENT_OTHER): Payer: Self-pay

## 2022-01-21 ENCOUNTER — Other Ambulatory Visit (HOSPITAL_COMMUNITY): Payer: Self-pay

## 2022-01-21 DIAGNOSIS — R197 Diarrhea, unspecified: Secondary | ICD-10-CM

## 2022-01-21 DIAGNOSIS — K5900 Constipation, unspecified: Secondary | ICD-10-CM

## 2022-01-21 DIAGNOSIS — R110 Nausea: Secondary | ICD-10-CM

## 2022-01-21 DIAGNOSIS — K529 Noninfective gastroenteritis and colitis, unspecified: Secondary | ICD-10-CM

## 2022-01-21 DIAGNOSIS — R109 Unspecified abdominal pain: Secondary | ICD-10-CM

## 2022-01-21 MED ORDER — MOTEGRITY 2 MG PO TABS
1.0000 | ORAL_TABLET | Freq: Every day | ORAL | 2 refills | Status: DC
  Filled 2022-01-21: qty 60, 60d supply, fill #0
  Filled 2022-03-17: qty 90, 90d supply, fill #1
  Filled 2022-06-13: qty 30, 30d supply, fill #2

## 2022-01-21 NOTE — Telephone Encounter (Signed)
Orders for labs, xray, [place] placed in Epic: [place] scheduled for 01/25/2022  at 7:00 AM arrive at 6:45 AM: [place]: NPO past midnight: Left message for pt to call back

## 2022-01-21 NOTE — Telephone Encounter (Signed)
Spoke with pt Pt was made aware of Dr. [name] recommendations: Pt notified to come by our lab for blood work, obtain stool specimen kit, Obtain x ray while in basement of building Pt notified of [place]  scheduled at [facility] on 01/25/2022 at 7:00 AM arrive at 6:45 AM. Nothing to eat or drink past midnight: Stop Linzess.  Start Motegrity 2 mg p.o. daily Trial of gluten-free diet x 2 weeks. Pt has OV already previously  scheduled with Dr. [name]. Pt aware Pt verbalized understanding with all questions answered.

## 2022-01-25 ENCOUNTER — Ambulatory Visit (HOSPITAL_COMMUNITY): Payer: No Typology Code available for payment source

## 2022-01-27 ENCOUNTER — Ambulatory Visit (HOSPITAL_BASED_OUTPATIENT_CLINIC_OR_DEPARTMENT_OTHER)
Admission: RE | Admit: 2022-01-27 | Discharge: 2022-01-27 | Disposition: A | Discharge status: Home / Self Care | Payer: No Typology Code available for payment source | Source: Ambulatory Visit | Attending: Gastroenterology | Admitting: Gastroenterology

## 2022-01-27 DIAGNOSIS — R109 Unspecified abdominal pain: Secondary | ICD-10-CM | POA: Diagnosis not present

## 2022-01-28 ENCOUNTER — Encounter: Payer: Self-pay | Admitting: Medical-Surgical

## 2022-01-29 ENCOUNTER — Other Ambulatory Visit: Payer: Self-pay | Admitting: Physician Assistant

## 2022-01-29 ENCOUNTER — Other Ambulatory Visit (INDEPENDENT_AMBULATORY_CARE_PROVIDER_SITE_OTHER): Payer: No Typology Code available for payment source

## 2022-01-29 ENCOUNTER — Ambulatory Visit (INDEPENDENT_AMBULATORY_CARE_PROVIDER_SITE_OTHER)
Admission: RE | Admit: 2022-01-29 | Discharge: 2022-01-29 | Disposition: A | Discharge status: Home / Self Care | Payer: No Typology Code available for payment source | Source: Ambulatory Visit | Attending: Physician Assistant | Admitting: Physician Assistant

## 2022-01-29 DIAGNOSIS — K529 Noninfective gastroenteritis and colitis, unspecified: Secondary | ICD-10-CM

## 2022-01-29 DIAGNOSIS — R110 Nausea: Secondary | ICD-10-CM

## 2022-01-29 DIAGNOSIS — K5900 Constipation, unspecified: Secondary | ICD-10-CM

## 2022-01-29 DIAGNOSIS — R197 Diarrhea, unspecified: Secondary | ICD-10-CM | POA: Diagnosis not present

## 2022-01-29 DIAGNOSIS — R109 Unspecified abdominal pain: Secondary | ICD-10-CM

## 2022-01-29 DIAGNOSIS — K219 Gastro-esophageal reflux disease without esophagitis: Secondary | ICD-10-CM

## 2022-01-29 LAB — CBC WITH DIFFERENTIAL/PLATELET
Basophils Absolute: 0.1 10*3/uL (ref 0.0–0.1)
Basophils Relative: 0.6 % (ref 0.0–3.0)
Eosinophils Absolute: 0.1 10*3/uL (ref 0.0–0.7)
Eosinophils Relative: 0.7 % (ref 0.0–5.0)
HCT: 39.9 % (ref 36.0–46.0)
Hemoglobin: 13.4 g/dL (ref 12.0–15.0)
Lymphocytes Relative: 18.7 % (ref 12.0–46.0)
Lymphs Abs: 2.6 10*3/uL (ref 0.7–4.0)
MCHC: 33.6 g/dL (ref 30.0–36.0)
MCV: 91.3 fl (ref 78.0–100.0)
Monocytes Absolute: 0.9 10*3/uL (ref 0.1–1.0)
Monocytes Relative: 6.3 % (ref 3.0–12.0)
Neutro Abs: 10.2 10*3/uL — ABNORMAL HIGH (ref 1.4–7.7)
Neutrophils Relative %: 73.7 % (ref 43.0–77.0)
Platelets: 413 10*3/uL — ABNORMAL HIGH (ref 150.0–400.0)
RBC: 4.37 Mil/uL (ref 3.87–5.11)
RDW: 15.1 % (ref 11.5–15.5)
WBC: 13.9 10*3/uL — ABNORMAL HIGH (ref 4.0–10.5)

## 2022-01-29 LAB — COMPREHENSIVE METABOLIC PANEL
ALT: 23 U/L (ref 0–35)
AST: 16 U/L (ref 0–37)
Albumin: 4.3 g/dL (ref 3.5–5.2)
Alkaline Phosphatase: 121 U/L — ABNORMAL HIGH (ref 39–117)
BUN: 7 mg/dL (ref 6–23)
CO2: 28 mEq/L (ref 19–32)
Calcium: 9.2 mg/dL (ref 8.4–10.5)
Chloride: 101 mEq/L (ref 96–112)
Creatinine, Ser: 0.78 mg/dL (ref 0.40–1.20)
GFR: 92.83 mL/min (ref >60.00)
Glucose, Bld: 85 mg/dL (ref 70–99)
Potassium: 4 mEq/L (ref 3.5–5.1)
Sodium: 137 mEq/L (ref 135–145)
Total Bilirubin: 0.3 mg/dL (ref 0.2–1.2)
Total Protein: 7 g/dL (ref 6.0–8.3)

## 2022-01-29 LAB — C-REACTIVE PROTEIN: CRP: <1 mg/dL (ref 0.5–20.0)

## 2022-01-30 ENCOUNTER — Telehealth: Payer: No Typology Code available for payment source | Admitting: Nurse Practitioner

## 2022-01-30 DIAGNOSIS — J069 Acute upper respiratory infection, unspecified: Secondary | ICD-10-CM | POA: Diagnosis not present

## 2022-01-30 MED ORDER — DOXYCYCLINE HYCLATE 100 MG PO TABS: 100.0000 mg | ORAL_TABLET | Freq: Two times a day (BID) | ORAL | 0 refills | Status: DC

## 2022-01-30 NOTE — Progress Notes (Signed)
E-Visit for Sinus Problems  We are sorry that you are not feeling well.  Here is how we plan to help!  Based on what you have shared with me it looks like you have proloned URI with cough and congestion   This is an infection caused by bacteria and is treated with antibiotics. I have prescribed Doxycycline 141m by mouth twice a day for 10 days. You may use an oral decongestant such as Mucinex D or if you have glaucoma or high blood pressure use plain Mucinex. Saline nasal spray help and can safely be used as often as needed for congestion.  If you develop worsening sinus pain, fever or notice severe headache and vision changes, or if symptoms are not better after completion of antibiotic, please schedule an appointment with a health care provider.    Sinus infections are not as easily transmitted as other respiratory infection, however we still recommend that you avoid close contact with loved ones, especially the very young and elderly.  Remember to wash your hands thoroughly throughout the day as this is the number one way to prevent the spread of infection!  Home Care: Only take medications as instructed by your medical team. Complete the entire course of an antibiotic. Do not take these medications with alcohol. A steam or ultrasonic humidifier can help congestion.  You can place a towel over your head and breathe in the steam from hot water coming from a faucet. Avoid close contacts especially the very young and the elderly. Cover your mouth when you cough or sneeze. Always remember to wash your hands.  Get Help Right Away If: You develop worsening fever or sinus pain. You develop a severe head ache or visual changes. Your symptoms persist after you have completed your treatment plan.  Make sure you Understand these instructions. Will watch your condition. Will get help right away if you are not doing well or get worse.  Thank you for choosing an e-visit.  Your e-visit answers were  reviewed by a board certified advanced clinical practitioner to complete your personal care plan. Depending upon the condition, your plan could have included both over the counter or prescription medications.  Please review your pharmacy choice. Make sure the pharmacy is open so you can pick up prescription now. If there is a problem, you may contact your provider through [employer] have the prescription routed to another pharmacy.  Your safety is important to [place] If you have drug allergies check your prescription carefully.   For the next 24 hours you can use MyChart to ask questions about today's visit, request a non-urgent call back, or ask for a work or school excuse. You will get an email in the next two days asking about your experience. I hope that your e-visit has been valuable and will speed your recovery.  Mary-Margaret MHassell Done FNP   5-10 minutes spent reviewing and documenting in chart.

## 2022-02-01 ENCOUNTER — Other Ambulatory Visit: Payer: Self-pay

## 2022-02-01 ENCOUNTER — Encounter: Payer: Self-pay | Admitting: Gastroenterology

## 2022-02-04 ENCOUNTER — Other Ambulatory Visit (HOSPITAL_BASED_OUTPATIENT_CLINIC_OR_DEPARTMENT_OTHER): Payer: Self-pay

## 2022-02-04 ENCOUNTER — Encounter: Payer: Self-pay | Admitting: Medical-Surgical

## 2022-02-04 ENCOUNTER — Ambulatory Visit (INDEPENDENT_AMBULATORY_CARE_PROVIDER_SITE_OTHER): Payer: No Typology Code available for payment source | Admitting: Medical-Surgical

## 2022-02-04 VITALS — BP 119/77 | HR 94 | Resp 20 | Ht 65.0 in | Wt 191.4 lb

## 2022-02-04 DIAGNOSIS — W540XXA Bitten by dog, initial encounter: Secondary | ICD-10-CM | POA: Diagnosis not present

## 2022-02-04 DIAGNOSIS — S61258A Open bite of other finger without damage to nail, initial encounter: Secondary | ICD-10-CM | POA: Diagnosis not present

## 2022-02-04 DIAGNOSIS — J329 Chronic sinusitis, unspecified: Secondary | ICD-10-CM

## 2022-02-04 DIAGNOSIS — J40 Bronchitis, not specified as acute or chronic: Secondary | ICD-10-CM

## 2022-02-04 MED ORDER — DEXMETHYLPHENIDATE HCL ER 30 MG PO CP24: 30.0000 mg | ORAL_CAPSULE | Freq: Every day | ORAL | 0 refills | Status: DC

## 2022-02-04 MED ORDER — DEXMETHYLPHENIDATE HCL ER 30 MG PO CP24
30.0000 mg | ORAL_CAPSULE | Freq: Every day | ORAL | 0 refills | Status: DC
  Filled 2022-02-05: qty 30, 30d supply, fill #0

## 2022-02-04 MED ORDER — FLUCONAZOLE 150 MG PO TABS
150.0000 mg | ORAL_TABLET | Freq: Once | ORAL | 0 refills | Status: AC
  Filled 2022-02-04: qty 1, 1d supply, fill #0

## 2022-02-04 MED ORDER — DEXMETHYLPHENIDATE HCL ER 30 MG PO CP24
30.0000 mg | ORAL_CAPSULE | Freq: Every day | ORAL | 0 refills | Status: DC
  Filled 2022-03-08 – 2022-03-17 (×2): qty 30, 30d supply, fill #0

## 2022-02-04 MED ORDER — AMOXICILLIN-POT CLAVULANATE 875-125 MG PO TABS
1.0000 | ORAL_TABLET | Freq: Two times a day (BID) | ORAL | 0 refills | Status: DC
  Filled 2022-02-04: qty 14, 7d supply, fill #0

## 2022-02-04 MED ORDER — HYDROCODONE BIT-HOMATROP MBR 5-1.5 MG/5ML PO SOLN
5.0000 mL | Freq: Three times a day (TID) | ORAL | 0 refills | Status: DC | PRN
  Filled 2022-02-04: qty 60, 4d supply, fill #0

## 2022-02-04 NOTE — Progress Notes (Signed)
Established Patient Office Visit  Subjective   Patient ID: Tracey Patterson, female    DOB: 1981/03/19  Age: 41 y.o. MRN: [id number]  Chief Complaint  Patient presents with   Animal Bite    Right Finger   Cough    Animal Bite  Associated symptoms include cough.  Cough Associated symptoms include chills.    Tracey Patterson is here today for a dog bite on right pointer finger. She stated this happened yesterday when she was trying to break up the fight between her two dogs. She reports that the dogs are up to date on their vaccinations. She reports that the site is tender and painful to touch along with redness and swelling.    Tracey Patterson reported she had an e-visit over the weekend for upper respiratory symptoms. She reports that she's had some progressing cough and nasal drip that affects her sleep. She reports that she is on day 5 of 10 of prescription for Doxycycline and she has not felt much better. She reports feeling some chills and low grade fever at home.   Review of Systems  Constitutional:  Positive for chills.  HENT:  Positive for congestion.   Respiratory:  Positive for cough.   Cardiovascular: Negative.   Gastrointestinal: Negative.   Musculoskeletal: Negative.   Skin:        Dog bite on right index finger.   Neurological: Negative.   Psychiatric/Behavioral: Negative.        Objective:     BP 119/77 (BP Location: Left Arm, Cuff Size: Normal)   Pulse 94   Resp 20   Ht 5' 5"  (1.651 m)   Wt 86.8 kg   SpO2 99%   BMI 31.85 kg/m    Physical Exam Constitutional:      General: She is not in acute distress.    Appearance: Normal appearance. She is not ill-appearing, toxic-appearing or diaphoretic.  HENT:     Head: Normocephalic and atraumatic.     Right Ear: Tympanic membrane normal.     Left Ear: Tympanic membrane normal.     Nose: Rhinorrhea present.     Mouth/Throat:     Mouth: Mucous membranes are moist.  Eyes:     General: No scleral icterus.    Pupils: Pupils  are equal, round, and reactive to light.  Cardiovascular:     Rate and Rhythm: Normal rate and regular rhythm.     Pulses: Normal pulses.     Heart sounds: Normal heart sounds.  Pulmonary:     Effort: Pulmonary effort is normal.     Breath sounds: Normal breath sounds.  Skin:    General: Skin is warm and dry.     Capillary Refill: Capillary refill takes less than 2 seconds.     Findings: Erythema present.     Comments: Edematous right index finger; tender on palpation.   Neurological:     General: No focal deficit present.     Mental Status: She is alert and oriented to person, place, and time. Mental status is at baseline.  Psychiatric:        Mood and Affect: Mood normal.        Behavior: Behavior normal.        Thought Content: Thought content normal.        Judgment: Judgment normal.      No results found for any visits on 02/04/22.    The 10-year ASCVD risk score (Arnett DK, et al., 2019) is: 0.6%  Assessment & Plan:   1. Dog bite of index finger, initial encounter We instructed her to keep the site clean and dry. She can use tylenol for pain. We also sent a prescription for Augmentin 875-125 mg tablet to take by mouth twice daily for [redacted] week along with diflucan 150 mg x 1 dose in an event that she develops a secondary fungal infection.   - amoxicillin-clavulanate (AUGMENTIN) 875-125 MG tablet; Take 1 tablet by mouth 2 (two) times daily.  Dispense: 14 tablet; Refill: 0 - fluconazole (DIFLUCAN) 150 MG tablet; Take 1 tablet (150 mg total) by mouth once for 1 dose.  Dispense: 1 tablet; Refill: 0  2. Sinobronchitis We instructed her to discontinue the doxycycline at this time. We sent a prescription for Hydrocodone bit-homatropine (Hycodan) 5-1.5 mg / 5 ml syrup. She may take 5 ml every 8 hours as needed for cough.    Return if symptoms worsen or fail to improve.    Ralph Leyden, RN Student NP

## 2022-02-04 NOTE — Progress Notes (Signed)
Medical screening examination/treatment was performed by qualified nurse practitioner student and as supervising provider I was immediately available for consultation/collaboration. I have reviewed documentation and agree with assessment and plan. ° °Kieffer Blatz L. Cline Draheim, DNP, APRN, FNP-BC °[facility] °Primary Care and Sports Medicine ° °

## 2022-02-05 ENCOUNTER — Other Ambulatory Visit (HOSPITAL_BASED_OUTPATIENT_CLINIC_OR_DEPARTMENT_OTHER): Payer: Self-pay

## 2022-02-08 NOTE — Progress Notes (Unsigned)
   Established Patient Office Visit  Subjective   Patient ID: Tracey Patterson, female   DOB: Apr 09, 1980 Age: 41 y.o. MRN: [id number]   No chief complaint on file.  HPI    Objective:    There were no vitals filed for this visit.  Physical Exam   No results found for this or any previous visit (from the past 24 hour(s)).   {Labs (Optional):23779}  The 10-year ASCVD risk score (Arnett DK, et al., 2019) is: 0.6%   Values used to calculate the score:     Age: 1 years     Sex: Female     Is Non-Hispanic African American: No     Diabetic: No     Tobacco smoker: No     Systolic Blood Pressure: 790 mmHg     Is BP treated: No     HDL Cholesterol: 55 mg/dL     Total Cholesterol: 203 mg/dL   Assessment & Plan:   No problem-specific Assessment & Plan notes found for this encounter.   No follow-ups on file.  ___________________________________________ Clearnce Sorrel, DNP, APRN, FNP-BC Primary Care and [place]

## 2022-02-09 ENCOUNTER — Ambulatory Visit (INDEPENDENT_AMBULATORY_CARE_PROVIDER_SITE_OTHER): Payer: No Typology Code available for payment source | Admitting: Medical-Surgical

## 2022-02-09 ENCOUNTER — Encounter: Payer: Self-pay | Admitting: Medical-Surgical

## 2022-02-09 ENCOUNTER — Other Ambulatory Visit (HOSPITAL_BASED_OUTPATIENT_CLINIC_OR_DEPARTMENT_OTHER): Payer: Self-pay

## 2022-02-09 VITALS — BP 120/79 | HR 104 | Resp 20 | Ht 65.0 in | Wt 191.0 lb

## 2022-02-09 DIAGNOSIS — F411 Generalized anxiety disorder: Secondary | ICD-10-CM | POA: Diagnosis not present

## 2022-02-09 DIAGNOSIS — F419 Anxiety disorder, unspecified: Secondary | ICD-10-CM | POA: Diagnosis not present

## 2022-02-09 DIAGNOSIS — F32A Depression, unspecified: Secondary | ICD-10-CM

## 2022-02-09 MED ORDER — QUETIAPINE FUMARATE ER 150 MG PO TB24
150.0000 mg | ORAL_TABLET | Freq: Every day | ORAL | 0 refills | Status: DC
  Filled 2022-02-09: qty 30, 30d supply, fill #0

## 2022-02-10 ENCOUNTER — Other Ambulatory Visit (HOSPITAL_BASED_OUTPATIENT_CLINIC_OR_DEPARTMENT_OTHER): Payer: Self-pay

## 2022-02-10 ENCOUNTER — Encounter: Payer: Self-pay | Admitting: Gastroenterology

## 2022-02-15 ENCOUNTER — Encounter: Payer: Self-pay | Admitting: Medical-Surgical

## 2022-02-15 DIAGNOSIS — J329 Chronic sinusitis, unspecified: Secondary | ICD-10-CM

## 2022-02-16 ENCOUNTER — Ambulatory Visit: Payer: No Typology Code available for payment source | Admitting: Gastroenterology

## 2022-02-16 ENCOUNTER — Ambulatory Visit (INDEPENDENT_AMBULATORY_CARE_PROVIDER_SITE_OTHER): Payer: No Typology Code available for payment source

## 2022-02-16 DIAGNOSIS — J329 Chronic sinusitis, unspecified: Secondary | ICD-10-CM | POA: Diagnosis not present

## 2022-02-17 ENCOUNTER — Other Ambulatory Visit: Payer: Self-pay | Admitting: Medical-Surgical

## 2022-02-17 ENCOUNTER — Other Ambulatory Visit (HOSPITAL_BASED_OUTPATIENT_CLINIC_OR_DEPARTMENT_OTHER): Payer: Self-pay

## 2022-02-17 ENCOUNTER — Other Ambulatory Visit (HOSPITAL_COMMUNITY): Payer: Self-pay

## 2022-02-17 DIAGNOSIS — U071 COVID-19: Secondary | ICD-10-CM

## 2022-02-17 MED ORDER — ALBUTEROL SULFATE HFA 108 (90 BASE) MCG/ACT IN AERS
2.0000 | INHALATION_SPRAY | Freq: Four times a day (QID) | RESPIRATORY_TRACT | 0 refills | Status: DC | PRN
  Filled 2022-02-17: qty 6.7, 21d supply, fill #0
  Filled 2022-11-19: qty 6.7, 25d supply, fill #0

## 2022-02-19 ENCOUNTER — Encounter: Payer: Self-pay | Admitting: Gastroenterology

## 2022-02-26 ENCOUNTER — Other Ambulatory Visit (HOSPITAL_BASED_OUTPATIENT_CLINIC_OR_DEPARTMENT_OTHER): Payer: Self-pay

## 2022-03-01 ENCOUNTER — Encounter: Payer: Self-pay | Admitting: Medical-Surgical

## 2022-03-05 ENCOUNTER — Telehealth: Payer: Self-pay

## 2022-03-05 NOTE — Telephone Encounter (Signed)
FMLA paperwork completed and faxed to [employer] at fax # [phone]. I have left her a detailed message that the paperwork has been completed and faxed. Paperwork scanned into epic.

## 2022-03-08 ENCOUNTER — Other Ambulatory Visit (HOSPITAL_COMMUNITY): Payer: Self-pay

## 2022-03-08 ENCOUNTER — Other Ambulatory Visit: Payer: Self-pay | Admitting: Medical-Surgical

## 2022-03-09 ENCOUNTER — Other Ambulatory Visit (HOSPITAL_COMMUNITY): Payer: Self-pay

## 2022-03-09 MED ORDER — QUETIAPINE FUMARATE ER 150 MG PO TB24
150.0000 mg | ORAL_TABLET | Freq: Every day | ORAL | 0 refills | Status: DC
  Filled 2022-03-09 – 2022-03-18 (×2): qty 30, 30d supply, fill #0
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
## 2022-03-16 ENCOUNTER — Ambulatory Visit (INDEPENDENT_AMBULATORY_CARE_PROVIDER_SITE_OTHER): Payer: No Typology Code available for payment source | Admitting: Physician Assistant

## 2022-03-16 ENCOUNTER — Other Ambulatory Visit (HOSPITAL_BASED_OUTPATIENT_CLINIC_OR_DEPARTMENT_OTHER): Payer: Self-pay

## 2022-03-16 ENCOUNTER — Encounter: Payer: Self-pay | Admitting: Physician Assistant

## 2022-03-16 ENCOUNTER — Other Ambulatory Visit (INDEPENDENT_AMBULATORY_CARE_PROVIDER_SITE_OTHER): Payer: No Typology Code available for payment source

## 2022-03-16 VITALS — BP 114/72 | HR 96 | Ht 65.5 in | Wt 191.0 lb

## 2022-03-16 DIAGNOSIS — K582 Mixed irritable bowel syndrome: Secondary | ICD-10-CM | POA: Diagnosis not present

## 2022-03-16 DIAGNOSIS — R110 Nausea: Secondary | ICD-10-CM | POA: Diagnosis not present

## 2022-03-16 DIAGNOSIS — K219 Gastro-esophageal reflux disease without esophagitis: Secondary | ICD-10-CM

## 2022-03-16 DIAGNOSIS — R634 Abnormal weight loss: Secondary | ICD-10-CM

## 2022-03-16 DIAGNOSIS — R748 Abnormal levels of other serum enzymes: Secondary | ICD-10-CM

## 2022-03-16 LAB — CBC WITH DIFFERENTIAL/PLATELET
Basophils Absolute: 0.1 10*3/uL (ref 0.0–0.1)
Basophils Relative: 1.4 % (ref 0.0–3.0)
Eosinophils Absolute: 0.2 10*3/uL (ref 0.0–0.7)
Eosinophils Relative: 1.8 % (ref 0.0–5.0)
HCT: 40.4 % (ref 36.0–46.0)
Hemoglobin: 13.8 g/dL (ref 12.0–15.0)
Lymphocytes Relative: 35.1 % (ref 12.0–46.0)
Lymphs Abs: 3.6 10*3/uL (ref 0.7–4.0)
MCHC: 34.2 g/dL (ref 30.0–36.0)
MCV: 90.3 fl (ref 78.0–100.0)
Monocytes Absolute: 0.6 10*3/uL (ref 0.1–1.0)
Monocytes Relative: 5.9 % (ref 3.0–12.0)
Neutro Abs: 5.7 10*3/uL (ref 1.4–7.7)
Neutrophils Relative %: 55.8 % (ref 43.0–77.0)
Platelets: 461 10*3/uL — ABNORMAL HIGH (ref 150.0–400.0)
RBC: 4.47 Mil/uL (ref 3.87–5.11)
RDW: 14.7 % (ref 11.5–15.5)
WBC: 10.2 10*3/uL (ref 4.0–10.5)

## 2022-03-16 LAB — SEDIMENTATION RATE: Sed Rate: 29 mm/hr — ABNORMAL HIGH (ref 0–20)

## 2022-03-16 LAB — COMPREHENSIVE METABOLIC PANEL
ALT: 37 U/L — ABNORMAL HIGH (ref 0–35)
AST: 21 U/L (ref 0–37)
Albumin: 4.4 g/dL (ref 3.5–5.2)
Alkaline Phosphatase: 141 U/L — ABNORMAL HIGH (ref 39–117)
BUN: 10 mg/dL (ref 6–23)
CO2: 26 mEq/L (ref 19–32)
Calcium: 9.2 mg/dL (ref 8.4–10.5)
Chloride: 102 mEq/L (ref 96–112)
Creatinine, Ser: 0.88 mg/dL (ref 0.40–1.20)
GFR: 80.25 mL/min (ref >60.00)
Glucose, Bld: 86 mg/dL (ref 70–99)
Potassium: 3.8 mEq/L (ref 3.5–5.1)
Sodium: 137 mEq/L (ref 135–145)
Total Bilirubin: 0.3 mg/dL (ref 0.2–1.2)
Total Protein: 7.3 g/dL (ref 6.0–8.3)

## 2022-03-16 MED ORDER — DICYCLOMINE HCL 20 MG PO TABS
20.0000 mg | ORAL_TABLET | Freq: Three times a day (TID) | ORAL | 0 refills | Status: DC | PRN
  Filled 2022-03-16: qty 50, 17d supply, fill #0

## 2022-03-16 NOTE — Patient Instructions (Signed)
_______________________________________________________  If you are age 43 or older, your body mass index should be between 23-30. Your Body mass index is 31.3 kg/m. If this is out of the aforementioned range listed, please consider follow up with your Primary Care Provider.  If you are age 21 or younger, your body mass index should be between 19-25. Your Body mass index is 31.3 kg/m. If this is out of the aformentioned range listed, please consider follow up with your Primary Care Provider.   ________________________________________________________  The [facility] providers would like to encourage you to use [facility] to communicate with providers for non-urgent requests or questions.  Due to long hold times on the telephone, sending your provider a message by [facility] may be a faster and more efficient way to get a response.  Please allow 48 business hours for a response.  Please remember that this is for non-urgent requests.  _______________________________________________________ Your provider has requested that you go to the basement level for lab work before leaving today. Press "B" on the elevator. The lab is located at the first door on the left as you exit the elevator.  You have been scheduled for an appointment with Dr. [name] on 05-18-2022 at 2:10 pm . Please arrive 10 minutes early for your appointment on the second floor.  You have been scheduled for a gastric emptying scan at [facility]tr[facility] on 04-01-2022 at 8am. Please arrive at least 30 minutes prior to your appointment for registration. Please make certain not to have anything to eat or drink after midnight the night before your test. Hold all stomach medications (ex: Zofran, phenergan, Reglan) 24 hours prior to your test. If you need to reschedule your appointment, please contact radiology scheduling at [phone]. _____________________________________________________________________ A gastric-emptying study measures  how long it takes for food to move through your stomach. There are several ways to measure stomach emptying. In the most common test, you eat food that contains a small amount of radioactive material. A scanner that detects the movement of the radioactive material is placed over your abdomen to monitor the rate at which food leaves your stomach. This test normally takes about 4 hours to complete. _____________________________________________________________________   Will get gastric emptying study Continue reglan as needed, continue gastroparesis diet  Continue motegrity, add on linzess 72 mcg or 145 mcg, will give samples.   Talk about getting off singulair.   First do a trial off milk/lactose products if you use them.  Add fiber like benefiber or citracel once a day Increase activity Can do trial of IBGard which is over the counter for AB pain- Take 1-2 capsules once a day for maintence or twice a day during a flare Can send in an anti spasm medication, Bentyl, to take as needed Please try to decrease stress. consider talking with PCP about anti anxiety medication or try head space app for meditation. if any worsening symptoms like blood in stool, weight loss, please call the office   Please try low FODMAP diet- see below- start with eliminating just one column at a time, the table at the very bottom contains foods that are safe to take   FODMAP stands for fermentable oligo-, di-, mono-saccharides and polyols (1). These are the scientific terms used to classify groups of carbs that are notorious for triggering digestive symptoms like bloating, gas and stomach pain.

## 2022-03-17 ENCOUNTER — Other Ambulatory Visit (HOSPITAL_BASED_OUTPATIENT_CLINIC_OR_DEPARTMENT_OTHER): Payer: Self-pay

## 2022-03-17 ENCOUNTER — Other Ambulatory Visit: Payer: Self-pay

## 2022-03-18 ENCOUNTER — Other Ambulatory Visit (HOSPITAL_COMMUNITY): Payer: Self-pay

## 2022-03-18 ENCOUNTER — Other Ambulatory Visit: Payer: Self-pay

## 2022-03-18 ENCOUNTER — Other Ambulatory Visit (HOSPITAL_BASED_OUTPATIENT_CLINIC_OR_DEPARTMENT_OTHER): Payer: Self-pay

## 2022-03-23 NOTE — Progress Notes (Signed)
Agree with assessment/plan.  Carmell Austria, MD [facility] [phone]

## 2022-03-26 ENCOUNTER — Other Ambulatory Visit: Payer: Self-pay

## 2022-04-01 ENCOUNTER — Ambulatory Visit (HOSPITAL_COMMUNITY): Payer: 59

## 2022-04-01 ENCOUNTER — Other Ambulatory Visit: Payer: 59

## 2022-04-01 DIAGNOSIS — K582 Mixed irritable bowel syndrome: Secondary | ICD-10-CM

## 2022-04-01 DIAGNOSIS — R748 Abnormal levels of other serum enzymes: Secondary | ICD-10-CM

## 2022-04-05 ENCOUNTER — Encounter: Payer: Self-pay | Admitting: Gastroenterology

## 2022-04-05 ENCOUNTER — Other Ambulatory Visit (HOSPITAL_BASED_OUTPATIENT_CLINIC_OR_DEPARTMENT_OTHER): Payer: Self-pay

## 2022-04-05 ENCOUNTER — Other Ambulatory Visit: Payer: Self-pay

## 2022-04-05 MED ORDER — LINACLOTIDE 145 MCG PO CAPS
145.0000 ug | ORAL_CAPSULE | Freq: Every day | ORAL | 0 refills | Status: DC
  Filled 2022-04-05 (×2): qty 90, 90d supply, fill #0

## 2022-04-07 LAB — PANCREATIC ELASTASE, FECAL: Pancreatic Elastase-1, Stool: >500 mcg/g

## 2022-04-08 LAB — FECAL FAT, QUALITATIVE
Fat Qual Neutral, Stl: NORMAL
Fat Qual Total, Stl: NORMAL

## 2022-04-08 LAB — CALPROTECTIN, FECAL: Calprotectin, Fecal: 48 ug/g (ref 0–120)

## 2022-04-14 ENCOUNTER — Other Ambulatory Visit: Payer: Self-pay

## 2022-04-14 ENCOUNTER — Other Ambulatory Visit: Payer: Self-pay | Admitting: Medical-Surgical

## 2022-04-14 ENCOUNTER — Other Ambulatory Visit (HOSPITAL_BASED_OUTPATIENT_CLINIC_OR_DEPARTMENT_OTHER): Payer: Self-pay

## 2022-04-14 MED ORDER — QUETIAPINE FUMARATE ER 150 MG PO TB24
150.0000 mg | ORAL_TABLET | Freq: Every day | ORAL | 0 refills | Status: DC
  Filled 2022-04-14: qty 30, 30d supply, fill #0

## 2022-04-16 ENCOUNTER — Other Ambulatory Visit (INDEPENDENT_AMBULATORY_CARE_PROVIDER_SITE_OTHER): Payer: 59

## 2022-04-16 DIAGNOSIS — R748 Abnormal levels of other serum enzymes: Secondary | ICD-10-CM | POA: Diagnosis not present

## 2022-04-16 LAB — GAMMA GT: GGT: 54 U/L — ABNORMAL HIGH (ref 7–51)

## 2022-04-16 LAB — HEPATIC FUNCTION PANEL
ALT: 32 U/L (ref 0–35)
AST: 20 U/L (ref 0–37)
Albumin: 4.5 g/dL (ref 3.5–5.2)
Alkaline Phosphatase: 138 U/L — ABNORMAL HIGH (ref 39–117)
Bilirubin, Direct: 0 mg/dL (ref 0.0–0.3)
Total Bilirubin: 0.3 mg/dL (ref 0.2–1.2)
Total Protein: 7.3 g/dL (ref 6.0–8.3)

## 2022-04-19 ENCOUNTER — Ambulatory Visit (INDEPENDENT_AMBULATORY_CARE_PROVIDER_SITE_OTHER): Payer: 59 | Admitting: Medical-Surgical

## 2022-04-19 ENCOUNTER — Other Ambulatory Visit (HOSPITAL_BASED_OUTPATIENT_CLINIC_OR_DEPARTMENT_OTHER): Payer: Self-pay

## 2022-04-19 ENCOUNTER — Encounter: Payer: Self-pay | Admitting: Medical-Surgical

## 2022-04-19 VITALS — BP 109/70 | HR 94 | Resp 20 | Ht 65.5 in | Wt 189.7 lb

## 2022-04-19 DIAGNOSIS — F5101 Primary insomnia: Secondary | ICD-10-CM | POA: Diagnosis not present

## 2022-04-19 DIAGNOSIS — F411 Generalized anxiety disorder: Secondary | ICD-10-CM | POA: Diagnosis not present

## 2022-04-19 DIAGNOSIS — M797 Fibromyalgia: Secondary | ICD-10-CM

## 2022-04-19 MED ORDER — BUPROPION HCL ER (XL) 300 MG PO TB24
300.0000 mg | ORAL_TABLET | Freq: Every day | ORAL | 3 refills | Status: DC
  Filled 2022-04-19: qty 90, 90d supply, fill #0
  Filled 2022-07-18: qty 90, 90d supply, fill #1
  Filled 2022-10-20 (×2): qty 90, 90d supply, fill #2
  Filled 2023-01-14: qty 90, 90d supply, fill #3

## 2022-04-19 MED ORDER — DULOXETINE HCL 60 MG PO CPEP
60.0000 mg | ORAL_CAPSULE | Freq: Every day | ORAL | 1 refills | Status: DC
  Filled 2022-04-19 – 2022-06-28 (×2): qty 90, 90d supply, fill #0
  Filled 2022-09-27: qty 90, 90d supply, fill #1

## 2022-04-19 MED ORDER — QUETIAPINE FUMARATE ER 150 MG PO TB24
150.0000 mg | ORAL_TABLET | Freq: Every day | ORAL | 1 refills | Status: DC
  Filled 2022-04-19: qty 90, 90d supply, fill #0
  Filled 2022-07-18: qty 90, 90d supply, fill #1

## 2022-04-19 MED ORDER — DULOXETINE HCL 30 MG PO CPEP
30.0000 mg | ORAL_CAPSULE | Freq: Every day | ORAL | 3 refills | Status: DC
  Filled 2022-04-19: qty 90, 90d supply, fill #0
  Filled 2022-07-18: qty 90, 90d supply, fill #1
  Filled 2022-10-20 (×2): qty 90, 90d supply, fill #2
  Filled 2023-01-14: qty 90, 90d supply, fill #3

## 2022-04-19 NOTE — Progress Notes (Signed)
Established Patient Office Visit  Subjective   Patient ID: Tracey Patterson, female   DOB: 08-Nov-1980 Age: 42 y.o. MRN: [id number]   Chief Complaint  Patient presents with   Follow-up   Anxiety   Depression   HPI Pleasant 42 year old female presenting today for the following:  Continues to have GI issues.  Is currently taking Motegrity and Linzess however is still having significant cramping and feeling quite miserable with the alternating constipation and diarrhea.  She has a follow-up in approximately 1 month with GI.  Has not reached out to them regarding her continued symptoms.  Mood/insomnia: She is currently taking Wellbutrin 300 mg daily, Cymbalta 90 mg daily, and Seroquel XR 150 mg nightly.  Tolerating her medications well without side effects.  Feels that they are working well overall.  Most of her anxiety revolves around work stress and her health concerns.  Feels like a lot of her symptoms would be better controlled if she can figure out what her GI problem truly is.   Objective:    Vitals:   04/19/22 1313  BP: 109/70  Pulse: 94  Resp: 20  Height: 5' 5.5" (1.664 m)  Weight: 189 lb 11.2 oz (86 kg)  SpO2: 100%  BMI (Calculated): 31.08    Physical Exam Vitals reviewed.  Constitutional:      General: She is not in acute distress.    Appearance: Normal appearance. She is not ill-appearing.  HENT:     Head: Normocephalic and atraumatic.  Cardiovascular:     Rate and Rhythm: Normal rate and regular rhythm.     Pulses: Normal pulses.     Heart sounds: Normal heart sounds.  Pulmonary:     Effort: Pulmonary effort is normal. No respiratory distress.     Breath sounds: Normal breath sounds. No wheezing, rhonchi or rales.  Skin:    General: Skin is warm and dry.  Neurological:     Mental Status: She is alert and oriented to person, place, and time.  Psychiatric:        Mood and Affect: Mood normal.        Behavior: Behavior normal.        Thought Content: Thought  content normal.        Judgment: Judgment normal.   No results found for this or any previous visit (from the past 24 hour(s)).     The 10-year ASCVD risk score (Arnett DK, et al., 2019) is: 0.5%   Values used to calculate the score:     Age: 76 years     Sex: Female     Is Non-Hispanic African American: No     Diabetic: No     Tobacco smoker: No     Systolic Blood Pressure: 761 mmHg     Is BP treated: No     HDL Cholesterol: 55 mg/dL     Total Cholesterol: 203 mg/dL   Assessment & Plan:   1. Generalized anxiety disorder 2. Fibromyalgia 3.  Primary insomnia Symptoms better managed than they were 6 weeks ago.  Feels that some of her depressive and anxiety symptoms are related to other health concerns and are very situational.  For now continue Wellbutrin 300 mg daily, Cymbalta 90 mg daily, and Seroquel XR 150 mg nightly as prescribed.  I recommend she contact her GI provider regarding her continued symptoms to get their recommendations on her current regimen. - buPROPion (WELLBUTRIN XL) 300 MG 24 hr tablet; Take 1 tablet (300 mg total) by  mouth daily.  Dispense: 90 tablet; Refill: 3 - DULoxetine (CYMBALTA) 60 MG capsule; Take 1 capsule (60 mg total) by mouth daily.  Dispense: 90 capsule; Refill: 1  Return in about 6 months (around 10/18/2022) for mood follow up or sooner if needed.  ___________________________________________ Clearnce Sorrel, DNP, APRN, FNP-BC Primary Care and Sports Medicine [place]

## 2022-04-20 ENCOUNTER — Encounter: Payer: Self-pay | Admitting: Gastroenterology

## 2022-04-20 ENCOUNTER — Other Ambulatory Visit (HOSPITAL_BASED_OUTPATIENT_CLINIC_OR_DEPARTMENT_OTHER): Payer: Self-pay

## 2022-04-20 LAB — HEPATITIS B SURFACE ANTIGEN: Hepatitis B Surface Ag: NONREACTIVE

## 2022-04-20 LAB — ALKALINE PHOSPHATASE, ISOENZYMES
Alkaline Phosphatase: 158 IU/L — ABNORMAL HIGH (ref 44–121)
BONE FRACTION: 31 % (ref 14–68)
INTESTINAL FRAC.: 3 % (ref 0–18)
LIVER FRACTION: 66 % (ref 18–85)

## 2022-04-20 LAB — ANTI-NUCLEAR AB-TITER (ANA TITER): ANA Titer 1: 1:40 {titer} — ABNORMAL HIGH

## 2022-04-20 LAB — NUCLEOTIDASE, 5', BLOOD: 5-Nucleotidase: 7 U/L (ref 0–10)

## 2022-04-20 LAB — HEPATITIS A ANTIBODY, IGM: Hep A IgM: NONREACTIVE

## 2022-04-20 LAB — EPSTEIN-BARR VIRUS VCA ANTIBODY PANEL
EBV NA IgG: 314 U/mL — ABNORMAL HIGH
EBV VCA IgG: 744 U/mL — ABNORMAL HIGH
EBV VCA IgM: <36 U/mL

## 2022-04-20 LAB — HEPATITIS C ANTIBODY: Hepatitis C Ab: NONREACTIVE

## 2022-04-20 LAB — ANA: Anti Nuclear Antibody (ANA): POSITIVE — AB

## 2022-04-20 LAB — MITOCHONDRIAL ANTIBODIES: Mitochondrial M2 Ab, IgG: <=20 U (ref <=20.0)

## 2022-04-20 LAB — IGG: IgG (Immunoglobin G), Serum: 854 mg/dL (ref 600–1640)

## 2022-04-20 LAB — ANTI-SMOOTH MUSCLE ANTIBODY, IGG: Actin (Smooth Muscle) Antibody (IGG): <20 U (ref <20)

## 2022-04-21 ENCOUNTER — Encounter: Payer: Self-pay | Admitting: Medical-Surgical

## 2022-04-21 DIAGNOSIS — I493 Ventricular premature depolarization: Secondary | ICD-10-CM

## 2022-05-05 NOTE — Progress Notes (Deleted)
Referring-Tracey Charna Archer, NP Reason for referral-PVCs/palpitations  HPI: 42 year old female for evaluation of PVCs/palpitations at request of [name], NP.  Monitor July 2022 showed sinus rhythm with PVCs.  Current Outpatient Medications  Medication Sig Dispense Refill   albuterol (VENTOLIN HFA) 108 (90 Base) MCG/ACT inhaler Inhale 2 puffs into the lungs every 6 (six) hours as needed for wheezing or shortness of breath. 6.7 g 0   Azelastine-Fluticasone 137-50 MCG/ACT SUSP Place 1 spray into the nose every 12 (twelve) hours. 23 g 2   buPROPion (WELLBUTRIN XL) 300 MG 24 hr tablet Take 1 tablet (300 mg total) by mouth daily. 90 tablet 3   clonazePAM (KLONOPIN) 0.5 MG tablet Take 1/2 - 1 tablet (0.25-0.5 mg total) by mouth 2 (two) times daily as needed for anxiety. 30 tablet 0   dicyclomine (BENTYL) 20 MG tablet Take 1 tablet (20 mg total) by mouth 3 (three) times daily as needed for spasms. 50 tablet 0   DULoxetine (CYMBALTA) 30 MG capsule Take 1 capsule (30 mg total) by mouth daily in the morning. 90 capsule 3   DULoxetine (CYMBALTA) 60 MG capsule Take 1 capsule (60 mg total) by mouth daily. 90 capsule 1   gabapentin (NEURONTIN) 300 MG capsule Take 1 capsule (300 mg total) by mouth 3 (three) times daily as needed (nerve pain). 90 capsule 1   guanFACINE (INTUNIV) 2 MG TB24 ER tablet Take 1 tablet by mouth every night 30 tablet 5   IBUPROFEN PO Take 800 mg by mouth as needed.      levonorgestrel (MIRENA, 52 MG,) 20 MCG/DAY IUD 1 Intra Uterine Device by Intrauterine route once.     linaclotide (LINZESS) 145 MCG CAPS capsule Take 1 capsule (145 mcg total) by mouth daily before breakfast. 90 capsule 0   Magnesium Chloride (MAGNESIUM DR PO) Take 4,000 mg by mouth daily.     metoCLOPramide (REGLAN) 10 MG tablet Take 1 tablet (10 mg total) by mouth 4 (four) times daily as needed for nausea. 120 tablet 0   Multiple Vitamin tablet Take 1 tablet by mouth daily.     NON FORMULARY Magnesium 2000 mg take  1 at bedtime     ondansetron (ZOFRAN) 4 MG tablet      pantoprazole (PROTONIX) 40 MG tablet Take 1 tablet (40 mg total) by mouth 2 (two) times daily. 90 tablet 3   Prucalopride Succinate (MOTEGRITY) 2 MG TABS Take 1 tablet (2 mg total) by mouth daily. 60 tablet 2   QUEtiapine Fumarate (SEROQUEL XR) 150 MG 24 hr tablet Take 1 tablet (150 mg total) by mouth at bedtime. 90 tablet 1   No current facility-administered medications for this visit.    Allergies  Allergen Reactions   Crab [Shellfish Allergy] Other (See Comments)    Allergist advises against eating crab.   Sulfa Antibiotics Hives   Zoloft [Sertraline Hcl] Other (See Comments)    Hair loss   Other Rash    Dermabond, steri strips     Past Medical History:  Diagnosis Date   Anxiety    Arthritis    Asthma    Chronic headache    COVID-19 11/2020   Depression    Fibromyalgia    GERD (gastroesophageal reflux disease)    History of TMJ disorder    IBS (irritable bowel syndrome)    Obesity    Pneumonia     Past Surgical History:  Procedure Laterality Date   BACK SURGERY  December 2015   KNEE  ARTHROSCOPY  1998   left   LAPAROSCOPIC TUBAL LIGATION  02/28/2012   Procedure: LAPAROSCOPIC TUBAL LIGATION;  Surgeon: Cyril Mourning, MD;  Location: [facility];  Service: Gynecology;  Laterality: Bilateral;   TONSILLECTOMY      Social History   Socioeconomic History   Marital status: Married    Spouse name: Not on file   Number of children: 3   Years of education: Not on file   Highest education level: Not on file  Occupational History   Not on file  Tobacco Use   Smoking status: Never   Smokeless tobacco: Never  Vaping Use   Vaping Use: Never used  Substance and Sexual Activity   Alcohol use: Not Currently   Drug use: No   Sexual activity: Not on file  Other Topics Concern   Not on file  Social History Narrative   Marital Status: Married Aeronautical engineer)    Children:  Son Air cabin crew) Daughter Therapist, occupational)    Pets: Dogs (1)  Cats (2)    Living Situation: Lives with  husband and children    Occupation: Radiology [employer]    Education: Bachelor's Degree in Science    Tobacco Use/Exposure:  None    Alcohol Use:  Occasional   Drug Use:  None   Diet:  Regular   Exercise:  None   Hobbies:  Reading          Social Determinants of Health   Financial Resource Strain: Not on file  Food Insecurity: Not on file  Transportation Needs: Not on file  Physical Activity: Not on file  Stress: Not on file  Social Connections: Not on file  Intimate Partner Violence: Not on file    Family History  Problem Relation Age of Onset   Hypertension Mother    Depression Mother    Hypertension Father    Breast cancer Maternal Aunt    Prostate cancer Maternal Uncle    Prostate cancer Maternal Uncle    Colon cancer Maternal Uncle    Esophageal cancer Neg Hx    Stomach cancer Neg Hx    Rectal cancer Neg Hx     ROS: no fevers or chills, productive cough, hemoptysis, dysphasia, odynophagia, melena, hematochezia, dysuria, hematuria, rash, seizure activity, orthopnea, PND, pedal edema, claudication. Remaining systems are negative.  Physical Exam:   There were no vitals taken for this visit.  General:  Well developed/well nourished in NAD Skin warm/dry Patient not depressed No peripheral clubbing Back-normal HEENT-normal/normal eyelids Neck supple/normal carotid upstroke bilaterally; no bruits; no JVD; no thyromegaly chest - CTA/ normal expansion CV - RRR/normal S1 and S2; no murmurs, rubs or gallops;  PMI nondisplaced Abdomen -NT/ND, no HSM, no mass, + bowel sounds, no bruit 2+ femoral pulses, no bruits Ext-no edema, chords, 2+ DP Neuro-grossly nonfocal  ECG - personally reviewed  A/P  1 palpitations-previous monitor showed sinus rhythm with PVCs.  Kirk Ruths, MD

## 2022-05-18 ENCOUNTER — Ambulatory Visit: Payer: 59 | Admitting: Gastroenterology

## 2022-05-18 ENCOUNTER — Other Ambulatory Visit (HOSPITAL_BASED_OUTPATIENT_CLINIC_OR_DEPARTMENT_OTHER): Payer: Self-pay

## 2022-05-19 ENCOUNTER — Ambulatory Visit: Payer: 59 | Admitting: Cardiology

## 2022-05-21 NOTE — Progress Notes (Signed)
Referring-Jade Breeback PA-C Reason for referral-palpitations, PVCs and dizziness  HPI: 42 year old female for evaluation of palpitations, PVCs and dizziness at request of Iran Planas PA-C.  Monitor July 2022 showed sinus rhythm with PVCs (rare less than 1%).  Patient describes some dizziness at times.  This is particularly prominent when changing positions from the lying to standing or sitting to the standing position.  She has not had syncope.  She does have dyspnea with more vigorous activities but no orthopnea, PND or increased pedal edema.  She occasionally feels chest tightness that improves with inhalers.  She does not have chest pain otherwise.  She has had episodes with her heart rate elevated above 120.  Occasional skips as well.  Cardiology now asked to evaluate.  Current Outpatient Medications  Medication Sig Dispense Refill   albuterol (VENTOLIN HFA) 108 (90 Base) MCG/ACT inhaler Inhale 2 puffs into the lungs every 6 (six) hours as needed for wheezing or shortness of breath. 6.7 g 0   Azelastine-Fluticasone 137-50 MCG/ACT SUSP Place 1 spray into the nose every 12 (twelve) hours. 23 g 2   buPROPion (WELLBUTRIN XL) 300 MG 24 hr tablet Take 1 tablet (300 mg total) by mouth daily. 90 tablet 3   dicyclomine (BENTYL) 20 MG tablet Take 1 tablet (20 mg total) by mouth 3 (three) times daily as needed for spasms. 50 tablet 0   DULoxetine (CYMBALTA) 30 MG capsule Take 1 capsule (30 mg total) by mouth daily in the morning. 90 capsule 3   DULoxetine (CYMBALTA) 60 MG capsule Take 1 capsule (60 mg total) by mouth daily. 90 capsule 1   gabapentin (NEURONTIN) 300 MG capsule Take 1 capsule (300 mg total) by mouth 3 (three) times daily as needed (nerve pain). 90 capsule 1   guanFACINE (INTUNIV) 2 MG TB24 ER tablet Take 1 tablet by mouth every night 30 tablet 5   guanFACINE (INTUNIV) 2 MG TB24 ER tablet Take 1 tablet (2 mg total) by mouth at bedtime. 30 tablet 5   IBUPROFEN PO Take 800 mg by mouth  as needed.      levonorgestrel (MIRENA, 52 MG,) 20 MCG/DAY IUD 1 Intra Uterine Device by Intrauterine route once.     linaclotide (LINZESS) 145 MCG CAPS capsule Take 1 capsule (145 mcg total) by mouth daily before breakfast. 90 capsule 0   Magnesium Chloride (MAGNESIUM DR PO) Take 4,000 mg by mouth daily.     metoCLOPramide (REGLAN) 10 MG tablet Take 1 tablet (10 mg total) by mouth 4 (four) times daily as needed for nausea. 120 tablet 0   Multiple Vitamin tablet Take 1 tablet by mouth daily.     NON FORMULARY Magnesium 2000 mg take 1 at bedtime     ondansetron (ZOFRAN) 4 MG tablet      pantoprazole (PROTONIX) 40 MG tablet Take 1 tablet (40 mg total) by mouth 2 (two) times daily. 90 tablet 3   Prucalopride Succinate (MOTEGRITY) 2 MG TABS Take 1 tablet (2 mg total) by mouth daily. 60 tablet 2   QUEtiapine Fumarate (SEROQUEL XR) 150 MG 24 hr tablet Take 1 tablet (150 mg total) by mouth at bedtime. 90 tablet 1   clonazePAM (KLONOPIN) 0.5 MG tablet Take 1/2 - 1 tablet (0.25-0.5 mg total) by mouth 2 (two) times daily as needed for anxiety. 30 tablet 0   No current facility-administered medications for this visit.    Allergies  Allergen Reactions   Crab [Shellfish Allergy] Other (See Comments)  Allergist advises against eating crab.   Sulfa Antibiotics Hives   Zoloft [Sertraline Hcl] Other (See Comments)    Hair loss   Other Rash    Dermabond, steri strips     Past Medical History:  Diagnosis Date   Anxiety    Arthritis    Asthma    Chronic headache    COVID-19 11/2020   Depression    Fibromyalgia    GERD (gastroesophageal reflux disease)    History of TMJ disorder    IBS (irritable bowel syndrome)    Obesity    Pneumonia     Past Surgical History:  Procedure Laterality Date   BACK SURGERY  December 2015   KNEE ARTHROSCOPY  1998   left   LAPAROSCOPIC TUBAL LIGATION  02/28/2012   Procedure: LAPAROSCOPIC TUBAL LIGATION;  Surgeon: Cyril Mourning, MD;  Location: [facility];   Service: Gynecology;  Laterality: Bilateral;   TONSILLECTOMY      Social History   Socioeconomic History   Marital status: Married    Spouse name: Not on file   Number of children: 3   Years of education: Not on file   Highest education level: Not on file  Occupational History   Not on file  Tobacco Use   Smoking status: Never   Smokeless tobacco: Never  Vaping Use   Vaping Use: Never used  Substance and Sexual Activity   Alcohol use: Never   Drug use: No   Sexual activity: Not on file  Other Topics Concern   Not on file  Social History Narrative   Marital Status: Married Aeronautical engineer)    Children:  Son Air cabin crew) Daughter Therapist, occupational)    Pets: Dogs (1) Cats (2)    Living Situation: Lives with  husband and children    Occupation: Radiology [employer]    Education: Bachelor's Degree in Science    Tobacco Use/Exposure:  None    Alcohol Use:  Occasional   Drug Use:  None   Diet:  Regular   Exercise:  None   Hobbies:  Reading          Social Determinants of Health   Financial Resource Strain: Not on file  Food Insecurity: Not on file  Transportation Needs: Not on file  Physical Activity: Not on file  Stress: Not on file  Social Connections: Not on file  Intimate Partner Violence: Not on file    Family History  Problem Relation Age of Onset   Hypertension Mother    Depression Mother    Hypertension Father    Breast cancer Maternal Aunt    Prostate cancer Maternal Uncle    Prostate cancer Maternal Uncle    Colon cancer Maternal Uncle    Esophageal cancer Neg Hx    Stomach cancer Neg Hx    Rectal cancer Neg Hx     ROS: no fevers or chills, productive cough, hemoptysis, dysphasia, odynophagia, melena, hematochezia, dysuria, hematuria, rash, seizure activity, orthopnea, PND, pedal edema, claudication. Remaining systems are negative.  Physical Exam:   Blood pressure 124/86, pulse 98, height '5\' 6"'$  (1.676 m), weight 196 lb 6.4 oz (89.1 kg),  SpO2 99 %.  General:  Well developed/well nourished in NAD Skin warm/dry Patient not depressed No peripheral clubbing Back-normal HEENT-normal/normal eyelids Neck supple/normal carotid upstroke bilaterally; no bruits; no JVD; no thyromegaly chest - CTA/ normal expansion CV - RRR/normal S1 and S2; no murmurs, rubs or gallops;  PMI nondisplaced Abdomen -NT/ND, no HSM, no  mass, + bowel sounds, no bruit 2+ femoral pulses, no bruits Ext-no edema, chords, 2+ DP Neuro-grossly nonfocal  ECG -normal sinus rhythm at a rate of 98, low voltage.  Personally reviewed  A/P  1 PVCs/palpitations/elevated heart rate-we will arrange an echocardiogram to assess LV function.  Will also check TSH.  Recent monitor showed PVCs.  These do not appear to be particularly bothersome.  If they worsen we can consider addition of low-dose beta-blocker.  2 dizziness-she is having episodes of dizziness with change in position that sounds likely orthostatic mediated.  I stressed the importance of maintaining hydration and increasing sodium intake.  3 dyspnea-some dyspnea on exertion.  Will arrange an echocardiogram to assess LV function.  Kirk Ruths, MD

## 2022-05-26 ENCOUNTER — Other Ambulatory Visit (HOSPITAL_BASED_OUTPATIENT_CLINIC_OR_DEPARTMENT_OTHER): Payer: Self-pay

## 2022-05-26 MED ORDER — GUANFACINE HCL ER 2 MG PO TB24
2.0000 mg | ORAL_TABLET | Freq: Every evening | ORAL | 5 refills | Status: DC
  Filled 2022-05-26: qty 30, 30d supply, fill #0
  Filled 2022-06-13 – 2022-06-28 (×2): qty 30, 30d supply, fill #1
  Filled 2022-07-18 – 2022-07-26 (×2): qty 30, 30d supply, fill #2

## 2022-05-27 ENCOUNTER — Other Ambulatory Visit (HOSPITAL_BASED_OUTPATIENT_CLINIC_OR_DEPARTMENT_OTHER): Payer: Self-pay

## 2022-05-28 ENCOUNTER — Other Ambulatory Visit (HOSPITAL_BASED_OUTPATIENT_CLINIC_OR_DEPARTMENT_OTHER): Payer: Self-pay

## 2022-06-03 ENCOUNTER — Ambulatory Visit: Payer: 59 | Attending: Cardiology | Admitting: Cardiology

## 2022-06-03 ENCOUNTER — Encounter: Payer: Self-pay | Admitting: Cardiology

## 2022-06-03 VITALS — BP 124/86 | HR 98 | Ht 66.0 in | Wt 196.4 lb

## 2022-06-03 DIAGNOSIS — R0602 Shortness of breath: Secondary | ICD-10-CM | POA: Diagnosis not present

## 2022-06-03 DIAGNOSIS — R002 Palpitations: Secondary | ICD-10-CM | POA: Diagnosis not present

## 2022-06-03 DIAGNOSIS — R42 Dizziness and giddiness: Secondary | ICD-10-CM | POA: Diagnosis not present

## 2022-06-03 NOTE — Patient Instructions (Signed)
  Testing/Procedures:  Your physician has requested that you have an echocardiogram. Echocardiography is a painless test that uses sound waves to create images of your heart. It provides your doctor with information about the size and shape of your heart and how well your heart's chambers and valves are working. This procedure takes approximately one hour. There are no restrictions for this procedure. Please do NOT wear cologne, perfume, aftershave, or lotions (deodorant is allowed). Please arrive 15 minutes prior to your appointment time. [address]   Follow-Up: At [facility], you and your health needs are our priority.  As part of our continuing mission to provide you with exceptional heart care, we have created designated Provider Care Teams.  These Care Teams include your primary Cardiologist (physician) and Advanced Practice Providers (APPs -  Physician Assistants and Nurse Practitioners) who all work together to provide you with the care you need, when you need it.  We recommend signing up for the patient portal called "MyChart".  Sign up information is provided on this After Visit Summary.  MyChart is used to connect with patients for Virtual Visits (Telemedicine).  Patients are able to view lab/test results, encounter notes, upcoming appointments, etc.  Non-urgent messages can be sent to your provider as well.   To learn more about what you can do with MyChart, go to [email or web address].    Your next appointment:   12 month(s)  Provider:   BRIAN CRENSHAW MD    

## 2022-06-04 LAB — TSH: TSH: 1.36 u[IU]/mL (ref 0.450–4.500)

## 2022-06-07 ENCOUNTER — Ambulatory Visit: Payer: 59 | Admitting: Gastroenterology

## 2022-06-14 ENCOUNTER — Other Ambulatory Visit (HOSPITAL_BASED_OUTPATIENT_CLINIC_OR_DEPARTMENT_OTHER): Payer: Self-pay

## 2022-06-21 ENCOUNTER — Other Ambulatory Visit (HOSPITAL_BASED_OUTPATIENT_CLINIC_OR_DEPARTMENT_OTHER): Payer: Self-pay

## 2022-06-22 ENCOUNTER — Other Ambulatory Visit (HOSPITAL_BASED_OUTPATIENT_CLINIC_OR_DEPARTMENT_OTHER): Payer: Self-pay

## 2022-06-23 ENCOUNTER — Other Ambulatory Visit: Payer: Self-pay

## 2022-06-28 ENCOUNTER — Other Ambulatory Visit: Payer: Self-pay

## 2022-06-28 ENCOUNTER — Other Ambulatory Visit: Payer: Self-pay | Admitting: Physician Assistant

## 2022-06-28 ENCOUNTER — Other Ambulatory Visit (HOSPITAL_BASED_OUTPATIENT_CLINIC_OR_DEPARTMENT_OTHER): Payer: Self-pay

## 2022-06-28 MED ORDER — LINACLOTIDE 145 MCG PO CAPS
145.0000 ug | ORAL_CAPSULE | Freq: Every day | ORAL | 1 refills | Status: DC
  Filled 2022-06-28: qty 90, 90d supply, fill #0
  Filled 2022-09-27: qty 90, 90d supply, fill #1

## 2022-07-06 ENCOUNTER — Ambulatory Visit (HOSPITAL_COMMUNITY): Payer: 59 | Attending: Cardiology

## 2022-07-06 DIAGNOSIS — R0602 Shortness of breath: Secondary | ICD-10-CM

## 2022-07-06 DIAGNOSIS — R002 Palpitations: Secondary | ICD-10-CM | POA: Insufficient documentation

## 2022-07-06 LAB — ECHOCARDIOGRAM COMPLETE
Area-P 1/2: 4.41 cm2
S' Lateral: 2.4 cm

## 2022-07-18 ENCOUNTER — Other Ambulatory Visit: Payer: Self-pay | Admitting: Medical-Surgical

## 2022-07-19 ENCOUNTER — Other Ambulatory Visit: Payer: Self-pay

## 2022-07-19 ENCOUNTER — Other Ambulatory Visit (HOSPITAL_BASED_OUTPATIENT_CLINIC_OR_DEPARTMENT_OTHER): Payer: Self-pay

## 2022-07-19 MED ORDER — AZELASTINE-FLUTICASONE 137-50 MCG/ACT NA SUSP
1.0000 | Freq: Two times a day (BID) | NASAL | 2 refills | Status: DC
  Filled 2022-07-19: qty 23, 30d supply, fill #0
  Filled 2022-08-27: qty 23, 60d supply, fill #0

## 2022-07-20 ENCOUNTER — Other Ambulatory Visit (HOSPITAL_BASED_OUTPATIENT_CLINIC_OR_DEPARTMENT_OTHER): Payer: Self-pay

## 2022-07-22 ENCOUNTER — Other Ambulatory Visit (HOSPITAL_BASED_OUTPATIENT_CLINIC_OR_DEPARTMENT_OTHER): Payer: Self-pay

## 2022-07-23 ENCOUNTER — Other Ambulatory Visit (HOSPITAL_BASED_OUTPATIENT_CLINIC_OR_DEPARTMENT_OTHER): Payer: Self-pay

## 2022-07-26 ENCOUNTER — Other Ambulatory Visit (HOSPITAL_BASED_OUTPATIENT_CLINIC_OR_DEPARTMENT_OTHER): Payer: Self-pay

## 2022-07-27 ENCOUNTER — Other Ambulatory Visit (HOSPITAL_BASED_OUTPATIENT_CLINIC_OR_DEPARTMENT_OTHER): Payer: Self-pay

## 2022-07-29 ENCOUNTER — Other Ambulatory Visit (HOSPITAL_BASED_OUTPATIENT_CLINIC_OR_DEPARTMENT_OTHER): Payer: Self-pay

## 2022-08-02 ENCOUNTER — Other Ambulatory Visit (HOSPITAL_BASED_OUTPATIENT_CLINIC_OR_DEPARTMENT_OTHER): Payer: Self-pay

## 2022-08-03 ENCOUNTER — Other Ambulatory Visit (HOSPITAL_BASED_OUTPATIENT_CLINIC_OR_DEPARTMENT_OTHER): Payer: Self-pay

## 2022-08-04 ENCOUNTER — Other Ambulatory Visit (HOSPITAL_BASED_OUTPATIENT_CLINIC_OR_DEPARTMENT_OTHER): Payer: Self-pay

## 2022-08-05 ENCOUNTER — Other Ambulatory Visit (HOSPITAL_BASED_OUTPATIENT_CLINIC_OR_DEPARTMENT_OTHER): Payer: Self-pay

## 2022-08-09 ENCOUNTER — Other Ambulatory Visit (HOSPITAL_BASED_OUTPATIENT_CLINIC_OR_DEPARTMENT_OTHER): Payer: Self-pay

## 2022-08-10 ENCOUNTER — Other Ambulatory Visit (HOSPITAL_BASED_OUTPATIENT_CLINIC_OR_DEPARTMENT_OTHER): Payer: Self-pay

## 2022-08-12 ENCOUNTER — Other Ambulatory Visit (HOSPITAL_BASED_OUTPATIENT_CLINIC_OR_DEPARTMENT_OTHER): Payer: Self-pay

## 2022-08-16 ENCOUNTER — Other Ambulatory Visit (HOSPITAL_BASED_OUTPATIENT_CLINIC_OR_DEPARTMENT_OTHER): Payer: Self-pay

## 2022-08-17 ENCOUNTER — Other Ambulatory Visit: Payer: Self-pay

## 2022-08-17 ENCOUNTER — Other Ambulatory Visit (HOSPITAL_BASED_OUTPATIENT_CLINIC_OR_DEPARTMENT_OTHER): Payer: Self-pay

## 2022-08-27 ENCOUNTER — Other Ambulatory Visit (HOSPITAL_BASED_OUTPATIENT_CLINIC_OR_DEPARTMENT_OTHER): Payer: Self-pay

## 2022-08-27 ENCOUNTER — Other Ambulatory Visit: Payer: Self-pay

## 2022-10-18 ENCOUNTER — Encounter: Payer: Self-pay | Admitting: Medical-Surgical

## 2022-10-18 ENCOUNTER — Other Ambulatory Visit (HOSPITAL_BASED_OUTPATIENT_CLINIC_OR_DEPARTMENT_OTHER): Payer: Self-pay

## 2022-10-18 ENCOUNTER — Ambulatory Visit (INDEPENDENT_AMBULATORY_CARE_PROVIDER_SITE_OTHER): Payer: 59 | Admitting: Medical-Surgical

## 2022-10-18 VITALS — BP 118/79 | HR 98 | Resp 20 | Ht 66.0 in | Wt 213.5 lb

## 2022-10-18 DIAGNOSIS — F411 Generalized anxiety disorder: Secondary | ICD-10-CM

## 2022-10-18 DIAGNOSIS — R059 Cough, unspecified: Secondary | ICD-10-CM

## 2022-10-18 DIAGNOSIS — F5101 Primary insomnia: Secondary | ICD-10-CM | POA: Diagnosis not present

## 2022-10-18 DIAGNOSIS — F339 Major depressive disorder, recurrent, unspecified: Secondary | ICD-10-CM | POA: Diagnosis not present

## 2022-10-18 DIAGNOSIS — M797 Fibromyalgia: Secondary | ICD-10-CM | POA: Diagnosis not present

## 2022-10-18 DIAGNOSIS — J020 Streptococcal pharyngitis: Secondary | ICD-10-CM

## 2022-10-18 LAB — POCT INFLUENZA A/B
Influenza A, POC: NEGATIVE
Influenza B, POC: NEGATIVE

## 2022-10-18 LAB — POC COVID19 BINAXNOW: SARS Coronavirus 2 Ag: NEGATIVE

## 2022-10-18 LAB — POCT RAPID STREP A (OFFICE): Rapid Strep A Screen: POSITIVE — AB

## 2022-10-18 MED ORDER — AMOXICILLIN-POT CLAVULANATE 875-125 MG PO TABS
1.0000 | ORAL_TABLET | Freq: Two times a day (BID) | ORAL | 0 refills | Status: DC
  Filled 2022-10-18: qty 20, 10d supply, fill #0

## 2022-10-18 MED ORDER — FLUCONAZOLE 150 MG PO TABS
150.0000 mg | ORAL_TABLET | Freq: Once | ORAL | 0 refills | Status: AC
  Filled 2022-10-18: qty 1, 1d supply, fill #0

## 2022-10-18 NOTE — Progress Notes (Signed)
        Established patient visit  History, exam, impression, and plan:  1. Strep pharyngitis Pleasant 42 year old female presenting today with reports of being exposed to COVID on two separate occasions over the weekend while at work. This morning woke up with a mild cough, ST, sinus congestion, and PND. Has not tried any medications yet. Negative for Flu and COVID. Positive for Strep A. Treating with Augmentin BID x 10 days. Adding Diflucan for VVC prophylaxis.  - POCT rapid strep A  2. Depression, recurrent (HCC) 3. Generalized anxiety disorder Long history of anxiety/depression and has tried multiple medications in the past without great benefit. Endorses mostly depressive symptoms, poor motivation, and oversleeping. Currently on Seroquel 150mg  XR nightly, Cymbalta 90mg  nightly, and Wellbutrin 300mg  daily. Tolerating all medications well without side effects. Is open to medication adjustments. Reviewed Genesight testing and she will let me know if she wants to pursue this. Plan to review medication history and will update her on medication recommendations for better management of symptoms. Denies SI/HI.   4. Fibromyalgia Currently managed with Cymbalta 90mg  nightly. Feels the medication is working fairly well and notes a worsening of symptoms when she misses a dose. Plan to continue Cymbalta as prescribed.   5. Primary insomnia Long history of insomnia currently managed with Seroquel 150mg  XR and Cymbalta 90mg  nightly. Feels this works well to help with sleep but also endorses oversleeping. Thinks this is related to mood rather than over medication.   6. Cough in adult Recent COVID exposure as above. Flu and COVID negative today. Recommend retesting for COVID in 48 hours.   - POCT Influenza A/B - POC COVID-19   Procedures performed this visit: None.  Return if symptoms worsen or fail to improve.  __________________________________ Thayer Ohm, DNP, APRN, FNP-BC Primary Care and  Sports Medicine [facility] [place]

## 2022-10-19 MED ORDER — CARIPRAZINE HCL 3 MG PO CAPS
3.0000 mg | ORAL_CAPSULE | Freq: Every day | ORAL | 0 refills | Status: DC
  Filled 2022-10-19: qty 30, 30d supply, fill #0

## 2022-10-19 MED ORDER — QUETIAPINE FUMARATE ER 50 MG PO TB24
ORAL_TABLET | ORAL | 0 refills | Status: DC
Start: 2022-10-19 — End: 2022-11-08
  Filled 2022-10-19: qty 21, 14d supply, fill #0

## 2022-10-19 MED ORDER — CARIPRAZINE HCL 1.5 MG PO CAPS
1.5000 mg | ORAL_CAPSULE | Freq: Every day | ORAL | 0 refills | Status: DC
Start: 2022-10-19 — End: 2022-11-08
  Filled 2022-10-19: qty 7, 7d supply, fill #0

## 2022-10-20 ENCOUNTER — Other Ambulatory Visit (HOSPITAL_BASED_OUTPATIENT_CLINIC_OR_DEPARTMENT_OTHER): Payer: Self-pay

## 2022-10-21 ENCOUNTER — Other Ambulatory Visit: Payer: Self-pay

## 2022-10-25 ENCOUNTER — Encounter: Payer: Self-pay | Admitting: Medical-Surgical

## 2022-11-04 ENCOUNTER — Ambulatory Visit (INDEPENDENT_AMBULATORY_CARE_PROVIDER_SITE_OTHER): Payer: 59 | Admitting: Sports Medicine

## 2022-11-04 ENCOUNTER — Ambulatory Visit: Payer: 59

## 2022-11-04 ENCOUNTER — Other Ambulatory Visit (HOSPITAL_BASED_OUTPATIENT_CLINIC_OR_DEPARTMENT_OTHER): Payer: Self-pay

## 2022-11-04 DIAGNOSIS — M722 Plantar fascial fibromatosis: Secondary | ICD-10-CM | POA: Diagnosis not present

## 2022-11-04 DIAGNOSIS — K581 Irritable bowel syndrome with constipation: Secondary | ICD-10-CM | POA: Diagnosis not present

## 2022-11-04 DIAGNOSIS — M5416 Radiculopathy, lumbar region: Secondary | ICD-10-CM

## 2022-11-04 DIAGNOSIS — M549 Dorsalgia, unspecified: Secondary | ICD-10-CM | POA: Diagnosis not present

## 2022-11-04 MED ORDER — LINACLOTIDE 290 MCG PO CAPS
290.0000 ug | ORAL_CAPSULE | Freq: Every day | ORAL | 3 refills | Status: DC
Start: 2022-11-04 — End: 2023-04-15
  Filled 2022-11-04: qty 30, 30d supply, fill #0
  Filled 2022-12-24: qty 30, 30d supply, fill #1

## 2022-11-04 MED ORDER — METHOCARBAMOL 500 MG PO TABS
500.0000 mg | ORAL_TABLET | Freq: Three times a day (TID) | ORAL | 0 refills | Status: AC
Start: 2022-11-04 — End: ?
  Filled 2022-11-04: qty 90, 30d supply, fill #0

## 2022-11-04 MED ORDER — PREGABALIN 50 MG PO CAPS
ORAL_CAPSULE | ORAL | 3 refills | Status: AC
Start: 2022-11-04 — End: ?
  Filled 2022-11-04: qty 90, 30d supply, fill #0
  Filled 2023-02-25: qty 90, 30d supply, fill #1
  Filled 2023-07-21: qty 90, 30d supply, fill #2
  Filled 2023-10-27: qty 90, 30d supply, fill #3

## 2022-11-04 MED ORDER — PREDNISONE 50 MG PO TABS
ORAL_TABLET | ORAL | 0 refills | Status: DC
Start: 2022-11-04 — End: 2022-11-08
  Filled 2022-11-04: qty 5, 5d supply, fill #0

## 2022-11-04 NOTE — Progress Notes (Signed)
    Procedures performed today:    None.  Independent interpretation of notes and tests performed by another provider:   None.  Brief History, Exam, Impression, and Recommendations:    Left lumbar radiculitis This is a very pleasant 42 year old female, she has a long history of spondylitic lumbar processes with multiple interventions, she has had epidurals, she has had facet RFA. She had a left L4 hemilaminectomy with Dr. Venetia Maxon. For the past year she has had increasing axial back pain with radiation down the back of the left leg to the small toe on the left foot consistent with a left S1 radiculitis. We will start treating this conservatively, 5 days of prednisone, x-rays, home physical therapy, Robaxin and Lyrica, she did not tolerate gabapentin. Return to see me in about 6 weeks, MR for interventional planning if not better.  Plantar fasciitis, right Several months of increasing right-sided plantar heel pain worse in the morning and when getting up from a seated position, tenderness directly at the plantar fascial origin, continue avoidance of barefoot walking, medications as above, home PT, return in 6 weeks, injection if not better.  Irritable bowel syndrome IBS-C with severe constipation, Linzess 145 not effective, increasing to 290.    ____________________________________________ Ihor Austin. Benjamin Stain, M.D., ABFM., CAQSM., AME. Primary Care and Sports Medicine [facility]  Adjunct Professor of Family Medicine  [employer]  Restaurant manager, fast food

## 2022-11-04 NOTE — Assessment & Plan Note (Signed)
Several months of increasing right-sided plantar heel pain worse in the morning and when getting up from a seated position, tenderness directly at the plantar fascial origin, continue avoidance of barefoot walking, medications as above, home PT, return in 6 weeks, injection if not better.

## 2022-11-04 NOTE — Assessment & Plan Note (Signed)
This is a very pleasant 42 year old female, she has a long history of spondylitic lumbar processes with multiple interventions, she has had epidurals, she has had facet RFA. She had a left L4 hemilaminectomy with Dr. Venetia Maxon. For the past year she has had increasing axial back pain with radiation down the back of the left leg to the small toe on the left foot consistent with a left S1 radiculitis. We will start treating this conservatively, 5 days of prednisone, x-rays, home physical therapy, Robaxin and Lyrica, she did not tolerate gabapentin. Return to see me in about 6 weeks, MR for interventional planning if not better.

## 2022-11-04 NOTE — Assessment & Plan Note (Signed)
IBS-C with severe constipation, Linzess 145 not effective, increasing to 290.

## 2022-11-08 ENCOUNTER — Ambulatory Visit (INDEPENDENT_AMBULATORY_CARE_PROVIDER_SITE_OTHER): Payer: 59 | Admitting: Medical-Surgical

## 2022-11-08 ENCOUNTER — Encounter: Payer: Self-pay | Admitting: Medical-Surgical

## 2022-11-08 VITALS — BP 136/86 | HR 99 | Temp 97.6°F

## 2022-11-08 DIAGNOSIS — J029 Acute pharyngitis, unspecified: Secondary | ICD-10-CM

## 2022-11-08 DIAGNOSIS — R058 Other specified cough: Secondary | ICD-10-CM

## 2022-11-08 LAB — POCT RAPID STREP A (OFFICE): Rapid Strep A Screen: NEGATIVE

## 2022-11-08 NOTE — Progress Notes (Signed)
Medical screening examination/treatment was performed by qualified clinical staff member and as supervising provider I was immediately available for consultation/collaboration. I have reviewed documentation and agree with assessment and plan. ° °Joy L. Jessup, DNP, APRN, FNP-BC °[facility] °Primary Care and Sports Medicine ° °

## 2022-11-08 NOTE — Telephone Encounter (Signed)
Patient scheduled.

## 2022-11-08 NOTE — Progress Notes (Signed)
   Established Patient Office Visit  Subjective   Patient ID: Meladie Ing, female    DOB: 1980/07/18  Age: 42 y.o. MRN: [id number]  Chief Complaint  Patient presents with   Sore Throat    For 2 days    HPI  Malillany Auer complains of sore throat, dry cough and runny nose for 2 days. She did have weakness and cold chills on Saturday.   ROS    Objective:     BP 136/86   Pulse 99   Temp 97.6 F (36.4 C) (Oral)   SpO2 97%    Physical Exam   Results for orders placed or performed in visit on 11/08/22  POCT rapid strep A  Result Value Ref Range   Rapid Strep A Screen Negative Negative      The 10-year ASCVD risk score (Arnett DK, et al., 2019) is: 0.9%    Assessment & Plan:  Sore throat - strep A negative.     Problem List Items Addressed This Visit   None Visit Diagnoses     Sore throat    -  Primary   Relevant Orders   POCT rapid strep A (Completed)       Return if symptoms worsen or fail to improve.    Esmond Harps, CMA

## 2022-11-10 ENCOUNTER — Other Ambulatory Visit: Payer: Self-pay

## 2022-11-15 ENCOUNTER — Encounter: Payer: Self-pay | Admitting: Sports Medicine

## 2022-11-15 DIAGNOSIS — M5416 Radiculopathy, lumbar region: Secondary | ICD-10-CM | POA: Diagnosis not present

## 2022-11-15 NOTE — Telephone Encounter (Signed)
I spent 5 total minutes of online digital evaluation and management services in this patient-initiated request for online care. 

## 2022-11-19 ENCOUNTER — Other Ambulatory Visit (HOSPITAL_BASED_OUTPATIENT_CLINIC_OR_DEPARTMENT_OTHER): Payer: Self-pay

## 2022-11-22 ENCOUNTER — Ambulatory Visit (INDEPENDENT_AMBULATORY_CARE_PROVIDER_SITE_OTHER): Payer: 59

## 2022-11-22 DIAGNOSIS — M5136 Other intervertebral disc degeneration, lumbar region: Secondary | ICD-10-CM | POA: Diagnosis not present

## 2022-11-22 DIAGNOSIS — M48061 Spinal stenosis, lumbar region without neurogenic claudication: Secondary | ICD-10-CM | POA: Diagnosis not present

## 2022-11-22 DIAGNOSIS — M47817 Spondylosis without myelopathy or radiculopathy, lumbosacral region: Secondary | ICD-10-CM | POA: Diagnosis not present

## 2022-11-22 DIAGNOSIS — M5416 Radiculopathy, lumbar region: Secondary | ICD-10-CM | POA: Diagnosis not present

## 2022-11-22 DIAGNOSIS — M47816 Spondylosis without myelopathy or radiculopathy, lumbar region: Secondary | ICD-10-CM | POA: Diagnosis not present

## 2022-11-25 ENCOUNTER — Ambulatory Visit (INDEPENDENT_AMBULATORY_CARE_PROVIDER_SITE_OTHER): Payer: 59 | Admitting: Medical-Surgical

## 2022-11-25 ENCOUNTER — Other Ambulatory Visit (HOSPITAL_COMMUNITY)
Admission: RE | Admit: 2022-11-25 | Discharge: 2022-11-25 | Disposition: A | Discharge status: Home / Self Care | Payer: 59 | Source: Ambulatory Visit | Attending: Medical-Surgical | Admitting: Medical-Surgical

## 2022-11-25 ENCOUNTER — Encounter: Payer: Self-pay | Admitting: Medical-Surgical

## 2022-11-25 ENCOUNTER — Other Ambulatory Visit: Payer: Self-pay

## 2022-11-25 ENCOUNTER — Other Ambulatory Visit (HOSPITAL_BASED_OUTPATIENT_CLINIC_OR_DEPARTMENT_OTHER): Payer: Self-pay

## 2022-11-25 VITALS — BP 136/86 | HR 111 | Resp 20 | Ht 65.5 in | Wt 216.4 lb

## 2022-11-25 DIAGNOSIS — Z30432 Encounter for removal of intrauterine contraceptive device: Secondary | ICD-10-CM

## 2022-11-25 DIAGNOSIS — Z23 Encounter for immunization: Secondary | ICD-10-CM | POA: Diagnosis not present

## 2022-11-25 DIAGNOSIS — F419 Anxiety disorder, unspecified: Secondary | ICD-10-CM | POA: Diagnosis not present

## 2022-11-25 DIAGNOSIS — F411 Generalized anxiety disorder: Secondary | ICD-10-CM | POA: Diagnosis not present

## 2022-11-25 DIAGNOSIS — Z124 Encounter for screening for malignant neoplasm of cervix: Secondary | ICD-10-CM

## 2022-11-25 DIAGNOSIS — F32A Depression, unspecified: Secondary | ICD-10-CM

## 2022-11-25 DIAGNOSIS — F5101 Primary insomnia: Secondary | ICD-10-CM

## 2022-11-25 DIAGNOSIS — R030 Elevated blood-pressure reading, without diagnosis of hypertension: Secondary | ICD-10-CM | POA: Diagnosis not present

## 2022-11-25 DIAGNOSIS — F339 Major depressive disorder, recurrent, unspecified: Secondary | ICD-10-CM

## 2022-11-25 MED ORDER — BELSOMRA 10 MG PO TABS
1.0000 | ORAL_TABLET | Freq: Every day | ORAL | 0 refills | Status: DC
  Filled 2022-11-25: qty 30, 30d supply, fill #0

## 2022-11-25 MED ORDER — CARIPRAZINE HCL 3 MG PO CAPS
3.0000 mg | ORAL_CAPSULE | Freq: Every day | ORAL | 0 refills | Status: AC
Start: 2022-11-25 — End: ?
  Filled 2022-11-25: qty 30, 30d supply, fill #0

## 2022-11-25 MED ORDER — METOPROLOL SUCCINATE ER 25 MG PO TB24
25.0000 mg | ORAL_TABLET | Freq: Every day | ORAL | 3 refills | Status: DC
  Filled 2022-11-25: qty 90, 90d supply, fill #0
  Filled 2023-02-18: qty 90, 90d supply, fill #1
  Filled 2023-05-19: qty 90, 90d supply, fill #2

## 2022-11-25 MED ORDER — CLONAZEPAM 0.5 MG PO TABS
0.2500 mg | ORAL_TABLET | Freq: Two times a day (BID) | ORAL | 0 refills | Status: DC | PRN
Start: 2022-11-25 — End: 2024-02-14
  Filled 2022-11-25: qty 30, 15d supply, fill #0

## 2022-11-25 NOTE — Progress Notes (Signed)
        Established patient visit  History, exam, impression, and plan:  1. Cervical cancer screening Pleasant 42 year old female presenting today for completion of cervical cancer screening.  Her last Pap smear was done a couple of years ago and the results were normal per her recollection.  Denies concerns for STIs.  Pap smear with HPV cotesting completed today. - Cytology - PAP  2. Encounter for IUD removal See below for procedure note.  3. Primary insomnia Chronic history of insomnia.  Most recently notes that she falls asleep easily but wakes multiple times throughout the night.  Was previously on Seroquel however this was considered the likely cause of significant weight gain.  This was discontinued and switched to Vraylar which is helping with mood but she has returned to frequent waking at night.  Interested in other options at this point.  Discussed various medications that can be used.  She is already on Cymbalta so avoiding trazodone and doxepin.  Plan to trial Belsomra 10 mg nightly.  4. Depression, recurrent (HCC) 5. Generalized anxiety disorder Currently taking Cymbalta and Vraylar as prescribed, tolerating well without side effects.  Feels the Leafy Kindle has been helpful for her mood.  Occasionally uses clonazepam but only for severe anxiety that does not respond to other measures.  Reports use is very rare but requesting a refill since hers are expired. - cariprazine (VRAYLAR) 3 MG capsule; Take 1 capsule (3 mg total) by mouth daily.  Dispense: 30 capsule; Refill: 0 - clonazePAM (KLONOPIN) 0.5 MG tablet; Take 1/2 - 1 tablet (0.25-0.5 mg total) by mouth 2 (two) times daily as needed for anxiety.  Dispense: 30 tablet; Refill: 0  7. Need for influenza vaccination Flu shot given in office today.  8. Elevated blood pressure reading Blood pressure elevated today and patient reports it has been that way for several weeks.  Heart rate is also elevated.  Concern for these findings in the  setting of stimulant use for ADHD.  Recheck of blood pressure slightly better but still at borderline.  With mild tachycardia, elevated blood pressure, and anxiety, starting Toprol XL 25 mg daily.  Procedures performed this visit: None.  Return in about 2 weeks (around 12/09/2022) for nurse visit for BP check.  __________________________________ Thayer Ohm, DNP, APRN, FNP-BC Primary Care and Sports Medicine [facility] [place]

## 2022-12-01 ENCOUNTER — Encounter: Payer: Self-pay | Admitting: Sports Medicine

## 2022-12-02 LAB — CYTOLOGY - PAP
Adequacy: ABSENT
Comment: NEGATIVE
Diagnosis: NEGATIVE
High risk HPV: NEGATIVE

## 2022-12-09 ENCOUNTER — Ambulatory Visit (INDEPENDENT_AMBULATORY_CARE_PROVIDER_SITE_OTHER): Payer: 59

## 2022-12-09 VITALS — BP 123/79 | HR 86 | Ht 65.5 in

## 2022-12-09 DIAGNOSIS — R030 Elevated blood-pressure reading, without diagnosis of hypertension: Secondary | ICD-10-CM | POA: Insufficient documentation

## 2022-12-09 NOTE — Patient Instructions (Signed)
Per Christen Butter, NP return in 6 months for follow up visit with provider.

## 2022-12-09 NOTE — Progress Notes (Signed)
   Established Patient Office Visit  Subjective   Patient ID: Tracey Patterson, female    DOB: 30-Dec-1980  Age: 42 y.o. MRN: [id number]  Chief Complaint  Patient presents with   elevated  blood presssure    BP check nurse visit.     HPI  BP check nurse visit. Patient denies chest pain, shortness of breath, dizziness, palpitations or medication problems.  ROS    Objective:     BP 123/79   Pulse 86   Ht 5' 5.5" (1.664 m)   SpO2 99%   BMI 35.46 kg/m    Physical Exam   No results found for any visits on 12/09/22.    The 10-year ASCVD risk score (Arnett DK, et al., 2019) is: 0.7%    Assessment & Plan:  BP check nurse visit. Initial reading = 124/83 second reading= 123/79. Per Christen Butter, NP return in 6 months for follow up visit with provider.  Problem List Items Addressed This Visit   None   Return in about 6 months (around 06/08/2023) for follow up visit for elevated BP .    Elizabeth Palau, LPN

## 2022-12-16 ENCOUNTER — Encounter: Payer: Self-pay | Admitting: Medical-Surgical

## 2022-12-16 ENCOUNTER — Ambulatory Visit (INDEPENDENT_AMBULATORY_CARE_PROVIDER_SITE_OTHER): Payer: 59 | Admitting: Sports Medicine

## 2022-12-16 ENCOUNTER — Encounter: Payer: Self-pay | Admitting: Sports Medicine

## 2022-12-16 DIAGNOSIS — M5416 Radiculopathy, lumbar region: Secondary | ICD-10-CM | POA: Diagnosis not present

## 2022-12-16 DIAGNOSIS — M722 Plantar fascial fibromatosis: Secondary | ICD-10-CM

## 2022-12-16 NOTE — Assessment & Plan Note (Signed)
This is a very pleasant 42 year old female, we have been treating her for left lumbar radiculitis, she has a long history of spondylitic processes with multiple interventions, she has had epidurals, facet RFA, she had a left L4 hemilaminectomy with Dr. Venetia Maxon, more recently pain down the back of the left leg, she improved considerably with home PT, prednisone, Lyrica. She will continue the Lyrica up taper if she has a recurrence of pain, discontinue Robaxin as it was not helpful. I did personally view her lumbar spine MRI which did show recurrent L4-L5 disc protrusion affecting the left intraspinal L5 nerve root. Ultimately she can return to see me as needed.

## 2022-12-16 NOTE — Progress Notes (Signed)
    Procedures performed today:    None.  Independent interpretation of notes and tests performed by another provider:   I did personally view her lumbar spine MRI which did show recurrent L4-L5 disc protrusion affecting the left intraspinal L5 nerve root.  Brief History, Exam, Impression, and Recommendations:    Left lumbar radiculitis This is a very pleasant 42 year old female, we have been treating her for left lumbar radiculitis, she has a long history of spondylitic processes with multiple interventions, she has had epidurals, facet RFA, she had a left L4 hemilaminectomy with Dr. Venetia Maxon, more recently pain down the back of the left leg, she improved considerably with home PT, prednisone, Lyrica. She will continue the Lyrica up taper if she has a recurrence of pain, discontinue Robaxin as it was not helpful. I did personally view her lumbar spine MRI which did show recurrent L4-L5 disc protrusion affecting the left intraspinal L5 nerve root. Ultimately she can return to see me as needed.  Plantar fasciitis, right Doing okay, has been moderately compliant with the home physical therapy, pain is 2/10 today, adding an air heel brace. Work harder on foot intrinsics and return to see me in 4 to 6 weeks if needed.    ____________________________________________ Ihor Austin. Benjamin Stain, M.D., ABFM., CAQSM., AME. Primary Care and Sports Medicine [facility]  Adjunct Professor of Family Medicine  [employer]  Restaurant manager, fast food

## 2022-12-16 NOTE — Assessment & Plan Note (Signed)
Doing okay, has been moderately compliant with the home physical therapy, pain is 2/10 today, adding an air heel brace. Work harder on foot intrinsics and return to see me in 4 to 6 weeks if needed.

## 2022-12-24 ENCOUNTER — Other Ambulatory Visit: Payer: Self-pay

## 2022-12-24 ENCOUNTER — Other Ambulatory Visit (HOSPITAL_BASED_OUTPATIENT_CLINIC_OR_DEPARTMENT_OTHER): Payer: Self-pay

## 2022-12-24 ENCOUNTER — Other Ambulatory Visit (HOSPITAL_COMMUNITY): Payer: Self-pay

## 2022-12-24 ENCOUNTER — Other Ambulatory Visit: Payer: Self-pay | Admitting: Medical-Surgical

## 2022-12-24 DIAGNOSIS — F411 Generalized anxiety disorder: Secondary | ICD-10-CM

## 2022-12-24 DIAGNOSIS — M797 Fibromyalgia: Secondary | ICD-10-CM

## 2022-12-24 DIAGNOSIS — F339 Major depressive disorder, recurrent, unspecified: Secondary | ICD-10-CM

## 2022-12-24 MED ORDER — DULOXETINE HCL 60 MG PO CPEP
60.0000 mg | ORAL_CAPSULE | Freq: Every day | ORAL | 1 refills | Status: DC
Start: 2022-12-24 — End: 2023-04-15
  Filled 2022-12-24 – 2022-12-29 (×3): qty 90, 90d supply, fill #0

## 2022-12-24 MED ORDER — CARIPRAZINE HCL 3 MG PO CAPS
3.0000 mg | ORAL_CAPSULE | Freq: Every day | ORAL | 0 refills | Status: DC
Start: 2022-12-24 — End: 2023-01-23
  Filled 2022-12-24: qty 30, 30d supply, fill #0

## 2022-12-27 ENCOUNTER — Encounter: Payer: Self-pay | Admitting: Medical-Surgical

## 2022-12-27 ENCOUNTER — Other Ambulatory Visit: Payer: Self-pay

## 2022-12-29 ENCOUNTER — Other Ambulatory Visit: Payer: Self-pay

## 2022-12-29 ENCOUNTER — Other Ambulatory Visit (HOSPITAL_BASED_OUTPATIENT_CLINIC_OR_DEPARTMENT_OTHER): Payer: Self-pay

## 2022-12-29 ENCOUNTER — Telehealth: Payer: 59 | Admitting: Physician Assistant

## 2022-12-29 DIAGNOSIS — J069 Acute upper respiratory infection, unspecified: Secondary | ICD-10-CM

## 2022-12-29 DIAGNOSIS — B9689 Other specified bacterial agents as the cause of diseases classified elsewhere: Secondary | ICD-10-CM | POA: Diagnosis not present

## 2022-12-29 MED ORDER — BENZONATATE 100 MG PO CAPS
100.0000 mg | ORAL_CAPSULE | Freq: Three times a day (TID) | ORAL | 0 refills | Status: DC | PRN
Start: 2022-12-29 — End: 2023-03-02
  Filled 2022-12-29: qty 30, 10d supply, fill #0

## 2022-12-29 MED ORDER — ALBUTEROL SULFATE HFA 108 (90 BASE) MCG/ACT IN AERS
2.0000 | INHALATION_SPRAY | Freq: Four times a day (QID) | RESPIRATORY_TRACT | 0 refills | Status: AC | PRN
Start: 2022-12-29 — End: ?
  Filled 2022-12-29: qty 6.7, 25d supply, fill #0

## 2022-12-29 MED ORDER — DOXYCYCLINE HYCLATE 100 MG PO TABS
100.0000 mg | ORAL_TABLET | Freq: Two times a day (BID) | ORAL | 0 refills | Status: DC
Start: 2022-12-29 — End: 2023-04-15
  Filled 2022-12-29: qty 14, 7d supply, fill #0

## 2022-12-29 NOTE — Progress Notes (Signed)
I have spent 5 minutes in review of e-visit questionnaire, review and updating patient chart, medical decision making and response to patient.   Mia Milan Cody Jacklynn Dehaas, PA-C    

## 2022-12-29 NOTE — Progress Notes (Signed)
E-Visit for Cough  We are sorry that you are not feeling well.  Here is how we plan to help!  Based on your presentation I believe you most likely have A cough due to bacteria.  When patients have a fever and a productive cough with a change in color or increased sputum production, we are concerned about bacterial bronchitis.  If left untreated it can progress to pneumonia.  If your symptoms do not improve with your treatment plan it is important that you contact your provider.   I have prescribed Doxycycline 100 mg twice a day for 7 days     In addition you may use A prescription cough medication called Tessalon Perles 100mg . You may take 1-2 capsules every 8 hours as needed for your cough. I have also sent in an albuterol inhaler to help relax your airway.  From your responses in the eVisit questionnaire you describe inflammation in the upper respiratory tract which is causing a significant cough.  This is commonly called Bronchitis and has four common causes:   Allergies Viral Infections Acid Reflux Bacterial Infection Allergies, viruses and acid reflux are treated by controlling symptoms or eliminating the cause. An example might be a cough caused by taking certain blood pressure medications. You stop the cough by changing the medication. Another example might be a cough caused by acid reflux. Controlling the reflux helps control the cough.  USE OF BRONCHODILATOR ("RESCUE") INHALERS: There is a risk from using your bronchodilator too frequently.  The risk is that over-reliance on a medication which only relaxes the muscles surrounding the breathing tubes can reduce the effectiveness of medications prescribed to reduce swelling and congestion of the tubes themselves.  Although you feel brief relief from the bronchodilator inhaler, your asthma may actually be worsening with the tubes becoming more swollen and filled with mucus.  This can delay other crucial treatments, such as oral steroid  medications. If you need to use a bronchodilator inhaler daily, several times per day, you should discuss this with your provider.  There are probably better treatments that could be used to keep your asthma under control.     HOME CARE Only take medications as instructed by your medical team. Complete the entire course of an antibiotic. Drink plenty of fluids and get plenty of rest. Avoid close contacts especially the very young and the elderly Cover your mouth if you cough or cough into your sleeve. Always remember to wash your hands A steam or ultrasonic humidifier can help congestion.   GET HELP RIGHT AWAY IF: You develop worsening fever. You become short of breath You cough up blood. Your symptoms persist after you have completed your treatment plan MAKE SURE YOU  Understand these instructions. Will watch your condition. Will get help right away if you are not doing well or get worse.    Thank you for choosing an e-visit.  Your e-visit answers were reviewed by a board certified advanced clinical practitioner to complete your personal care plan. Depending upon the condition, your plan could have included both over the counter or prescription medications.  Please review your pharmacy choice. Make sure the pharmacy is open so you can pick up prescription now. If there is a problem, you may contact your provider through [employer] and have the prescription routed to another pharmacy.  Your safety is important to [place]. If you have drug allergies check your prescription carefully.   For the next 24 hours you can use MyChart to ask  questions about today's visit, request a non-urgent call back, or ask for a work or school excuse. You will get an email in the next two days asking about your experience. I hope that your e-visit has been valuable and will speed your recovery.

## 2022-12-31 ENCOUNTER — Encounter: Payer: Self-pay | Admitting: Medical-Surgical

## 2023-01-23 ENCOUNTER — Other Ambulatory Visit: Payer: Self-pay | Admitting: Medical-Surgical

## 2023-01-23 DIAGNOSIS — F339 Major depressive disorder, recurrent, unspecified: Secondary | ICD-10-CM

## 2023-01-23 DIAGNOSIS — F411 Generalized anxiety disorder: Secondary | ICD-10-CM

## 2023-01-24 ENCOUNTER — Other Ambulatory Visit (HOSPITAL_BASED_OUTPATIENT_CLINIC_OR_DEPARTMENT_OTHER): Payer: Self-pay

## 2023-01-24 MED ORDER — CARIPRAZINE HCL 3 MG PO CAPS
3.0000 mg | ORAL_CAPSULE | Freq: Every day | ORAL | 0 refills | Status: DC
Start: 2023-01-24 — End: 2023-03-02
  Filled 2023-01-24: qty 30, 30d supply, fill #0

## 2023-01-27 ENCOUNTER — Ambulatory Visit: Payer: 59 | Admitting: Sports Medicine

## 2023-02-25 ENCOUNTER — Other Ambulatory Visit: Payer: Self-pay

## 2023-02-25 ENCOUNTER — Other Ambulatory Visit: Payer: Self-pay | Admitting: Medical-Surgical

## 2023-02-25 DIAGNOSIS — F411 Generalized anxiety disorder: Secondary | ICD-10-CM

## 2023-02-25 DIAGNOSIS — F339 Major depressive disorder, recurrent, unspecified: Secondary | ICD-10-CM

## 2023-03-02 ENCOUNTER — Other Ambulatory Visit (HOSPITAL_BASED_OUTPATIENT_CLINIC_OR_DEPARTMENT_OTHER): Payer: Self-pay

## 2023-03-02 ENCOUNTER — Ambulatory Visit (INDEPENDENT_AMBULATORY_CARE_PROVIDER_SITE_OTHER): Payer: 59 | Admitting: Medical-Surgical

## 2023-03-02 VITALS — BP 119/80 | HR 88 | Resp 20 | Ht 65.5 in | Wt 230.8 lb

## 2023-03-02 DIAGNOSIS — H6991 Unspecified Eustachian tube disorder, right ear: Secondary | ICD-10-CM | POA: Diagnosis not present

## 2023-03-02 DIAGNOSIS — F411 Generalized anxiety disorder: Secondary | ICD-10-CM | POA: Diagnosis not present

## 2023-03-02 DIAGNOSIS — R635 Abnormal weight gain: Secondary | ICD-10-CM | POA: Diagnosis not present

## 2023-03-02 DIAGNOSIS — F419 Anxiety disorder, unspecified: Secondary | ICD-10-CM | POA: Diagnosis not present

## 2023-03-02 DIAGNOSIS — F339 Major depressive disorder, recurrent, unspecified: Secondary | ICD-10-CM | POA: Diagnosis not present

## 2023-03-02 DIAGNOSIS — F32A Depression, unspecified: Secondary | ICD-10-CM

## 2023-03-02 MED ORDER — FLUTICASONE PROPIONATE 50 MCG/ACT NA SUSP
2.0000 | Freq: Every day | NASAL | 6 refills | Status: AC
  Filled 2023-03-02: qty 16, 30d supply, fill #0
  Filled 2023-04-18: qty 16, 30d supply, fill #1
  Filled 2023-10-27: qty 16, 30d supply, fill #2

## 2023-03-02 MED ORDER — BUPROPION HCL ER (XL) 150 MG PO TB24
150.0000 mg | ORAL_TABLET | Freq: Every day | ORAL | 1 refills | Status: DC
  Filled 2023-03-02: qty 90, 90d supply, fill #0

## 2023-03-02 MED ORDER — CARIPRAZINE HCL 3 MG PO CAPS
3.0000 mg | ORAL_CAPSULE | Freq: Every day | ORAL | 1 refills | Status: DC
  Filled 2023-03-02: qty 90, 90d supply, fill #0
  Filled 2023-05-27: qty 90, 90d supply, fill #1

## 2023-03-02 NOTE — Progress Notes (Signed)
        Established patient visit  History, exam, impression, and plan:  1. Depression, recurrent (HCC) 2. Generalized anxiety disorder Tracey Patterson 42 year old female presenting today with reports of severe exacerbation of her depressive symptoms.  She has had at least 2 weeks of very poor motivation, oversleeping, emotional lability, and difficulty functioning for everyday tasks.  Endorses severely decreased libido which is causing problems with her relationship with her spouse.  He is currently treated with Cymbalta 90 mg nightly, Wellbutrin 300 mg daily, and Vraylar 3 mg daily.  Admits to running out of her Leafy Kindle couple of days ago and needs a refill.  Also uses clonazepam 0.25-0.5 mg twice daily as needed.  Is interested in getting a GeneSight test ordered to evaluate the appropriateness of her current regimen and if there may be better options for her.  Not currently doing any counseling.  Discussed options.  Checking labs as below.  Refilling Vraylar.  Continue Cymbalta as prescribed.  Increasing Wellbutrin to 450 mg daily.  Ordering GeneSight testing.  Amenable to starting counseling so referring to behavioral health to get her started. - buPROPion (WELLBUTRIN XL) 150 MG 24 hr tablet; Take 1 tablet (150 mg total) by mouth daily.  Dispense: 90 tablet; Refill: 1 - cariprazine (VRAYLAR) 3 MG capsule; Take 1 capsule (3 mg total) by mouth daily.  Dispense: 90 capsule; Refill: 1 - CBC with Differential/Platelet - CMP14+EGFR - TSH - Hemoglobin A1c - VITAMIN D 25 Hydroxy (Vit-D Deficiency, Fractures) - B12 and Folate Panel - Homocysteine - Ambulatory referral to Behavioral Health  3. Acute dysfunction of right eustachian tube Notes that she has had some reduced hearing/muffling of the right ear which has been bothering her for a bit.  No other associated symptoms outside of sinus congestion which is chronic.  Not currently doing any treatment at home.  On exam, her right TM is positive for middle  ear effusion but no signs of otitis media.  Suspect eustachian tube dysfunction related to chronic inflammation from environmental allergies.  Adding Flonase 2 sprays each nostril once daily.  Make sure to take a daily antihistamine for allergy management. - fluticasone (FLONASE) 50 MCG/ACT nasal spray; Place 2 sprays into both nostrils daily.  Dispense: 16 g; Refill: 6  4. Weight gain Has noted quite a bit of weight gain since having her IUD removed.  Concern for possible endocrine etiology.  Checking A1c and TSH today. - TSH - Hemoglobin A1c  Procedures performed this visit: None.  Return if symptoms worsen or fail to improve.  Further follow-up pending GeneSight testing results.  __________________________________ Thayer Ohm, DNP, APRN, FNP-BC Primary Care and Sports Medicine [facility]

## 2023-03-04 LAB — CBC WITH DIFFERENTIAL/PLATELET
Basophils Absolute: 0.1 10*3/uL (ref 0.0–0.2)
Basos: 1 %
EOS (ABSOLUTE): 0.2 10*3/uL (ref 0.0–0.4)
Eos: 2 %
Hematocrit: 39.5 % (ref 34.0–46.6)
Hemoglobin: 13.4 g/dL (ref 11.1–15.9)
Immature Grans (Abs): 0 10*3/uL (ref 0.0–0.1)
Immature Granulocytes: 0 %
Lymphocytes Absolute: 3.6 10*3/uL — ABNORMAL HIGH (ref 0.7–3.1)
Lymphs: 39 %
MCH: 31.3 pg (ref 26.6–33.0)
MCHC: 33.9 g/dL (ref 31.5–35.7)
MCV: 92 fL (ref 79–97)
Monocytes Absolute: 0.6 10*3/uL (ref 0.1–0.9)
Monocytes: 7 %
Neutrophils Absolute: 4.6 10*3/uL (ref 1.4–7.0)
Neutrophils: 51 %
Platelets: 452 10*3/uL — ABNORMAL HIGH (ref 150–450)
RBC: 4.28 x10E6/uL (ref 3.77–5.28)
RDW: 12.9 % (ref 11.7–15.4)
WBC: 9.1 10*3/uL (ref 3.4–10.8)

## 2023-03-04 LAB — HEMOGLOBIN A1C
Est. average glucose Bld gHb Est-mCnc: 114 mg/dL
Hgb A1c MFr Bld: 5.6 % (ref 4.8–5.6)

## 2023-03-04 LAB — TSH: TSH: 1.79 u[IU]/mL (ref 0.450–4.500)

## 2023-03-04 LAB — CMP14+EGFR
ALT: 52 [IU]/L — ABNORMAL HIGH (ref 0–32)
AST: 40 [IU]/L (ref 0–40)
Albumin: 4.3 g/dL (ref 3.9–4.9)
Alkaline Phosphatase: 182 [IU]/L — ABNORMAL HIGH (ref 44–121)
BUN/Creatinine Ratio: 14 (ref 9–23)
BUN: 11 mg/dL (ref 6–24)
Bilirubin Total: 0.2 mg/dL (ref 0.0–1.2)
CO2: 19 mmol/L — ABNORMAL LOW (ref 20–29)
Calcium: 9 mg/dL (ref 8.7–10.2)
Chloride: 102 mmol/L (ref 96–106)
Creatinine, Ser: 0.78 mg/dL (ref 0.57–1.00)
Globulin, Total: 2.5 g/dL (ref 1.5–4.5)
Glucose: 87 mg/dL (ref 70–99)
Potassium: 4.7 mmol/L (ref 3.5–5.2)
Sodium: 137 mmol/L (ref 134–144)
Total Protein: 6.8 g/dL (ref 6.0–8.5)
eGFR: 96 mL/min/{1.73_m2} (ref >59)

## 2023-03-04 LAB — HOMOCYSTEINE: Homocysteine: 6 umol/L (ref 0.0–14.5)

## 2023-03-04 LAB — VITAMIN D 25 HYDROXY (VIT D DEFICIENCY, FRACTURES): Vit D, 25-Hydroxy: 46.6 ng/mL (ref 30.0–100.0)

## 2023-03-04 LAB — B12 AND FOLATE PANEL
Folate: >20 ng/mL (ref >3.0)
Vitamin B-12: 682 pg/mL (ref 232–1245)

## 2023-03-07 ENCOUNTER — Encounter: Payer: Self-pay | Admitting: Medical-Surgical

## 2023-03-07 ENCOUNTER — Other Ambulatory Visit: Payer: Self-pay | Admitting: Medical-Surgical

## 2023-03-07 DIAGNOSIS — D75839 Thrombocytosis, unspecified: Secondary | ICD-10-CM

## 2023-03-09 ENCOUNTER — Other Ambulatory Visit: Payer: Self-pay

## 2023-03-09 DIAGNOSIS — R748 Abnormal levels of other serum enzymes: Secondary | ICD-10-CM

## 2023-03-10 DIAGNOSIS — R748 Abnormal levels of other serum enzymes: Secondary | ICD-10-CM | POA: Diagnosis not present

## 2023-03-11 LAB — GAMMA GT: GGT: 54 [IU]/L (ref 0–60)

## 2023-03-17 ENCOUNTER — Encounter: Payer: Self-pay | Admitting: Medical-Surgical

## 2023-03-21 ENCOUNTER — Encounter: Payer: Self-pay | Admitting: Medical-Surgical

## 2023-03-21 ENCOUNTER — Telehealth (INDEPENDENT_AMBULATORY_CARE_PROVIDER_SITE_OTHER): Payer: 59 | Admitting: Medical-Surgical

## 2023-03-21 DIAGNOSIS — F32A Depression, unspecified: Secondary | ICD-10-CM

## 2023-03-21 DIAGNOSIS — F419 Anxiety disorder, unspecified: Secondary | ICD-10-CM | POA: Diagnosis not present

## 2023-03-21 NOTE — Progress Notes (Signed)
Virtual Visit via Video Note  I connected with Tracey Patterson on 03/21/23 at  3:40 PM EST by a video enabled telemedicine application and verified that I am speaking with the correct person using two identifiers.   I discussed the limitations of evaluation and management by telemedicine and the availability of in person appointments. The patient expressed understanding and agreed to proceed.  Patient location: home Provider locations: office  Subjective:    CC: Medication adjustment  HPI: Pleasant 42 year old female presenting via MyChart video visit to discuss recent GeneSight report and her current medication regimen.  Her GeneSight report showed numerous gene drug interactions to mood altering medications including Cymbalta which she is currently taking.  Wellbutrin also shows up on the gene drug interaction list however this is more moderate rather than significant.  She has continued to struggle with fatigue, fibromyalgia, anxiety, and depression.  Interested in making medication changes in line with the report findings.   Past medical history, Surgical history, Family history not pertinant except as noted below, Social history, Allergies, and medications have been entered into the medical record, reviewed, and corrections made.   Review of Systems: See HPI for pertinent positives and negatives.   Objective:    General: Speaking clearly in complete sentences without any shortness of breath.  Alert and oriented x3.  Normal judgment. No apparent acute distress.  Impression and Recommendations:    1. Anxiety and depression (Primary) Reviewed report findings.  Due to the several moderate to significant gene drug interactions, we will be discontinuing Cymbalta with a slow taper and starting Viibryd in its place.  Advised that she may have some worsening of fibromyalgia symptoms with the change and to monitor closely for this.  Taper instructions provided with AVS for her to review  online.  For now we will continue Wellbutrin and Vraylar as prescribed with close follow-up in 4-6 weeks (okay to be virtual).  I discussed the assessment and treatment plan with the patient. The patient was provided an opportunity to ask questions and all were answered. The patient agreed with the plan and demonstrated an understanding of the instructions.   The patient was advised to call back or seek an in-person evaluation if the symptoms worsen or if the condition fails to improve as anticipated.  Return in about 6 weeks (around 05/02/2023) for mood follow up.  Thayer Ohm, DNP, APRN, FNP-BC [facility] and Sports Medicine

## 2023-03-21 NOTE — Patient Instructions (Signed)
Taper instructions:  Week 1: Reduce Cymbalta to 60 mg once daily.  Week 2: Reduce Cymbalta 30 mg once daily.  Start Viibryd 10 mg daily.  Week 3: Reduce Cymbalta 30 mg every other day.  Increase Viibryd to 20 mg daily.  Week 4: Discontinue Cymbalta completely.  Continue Viibryd 20 mg daily.

## 2023-03-25 ENCOUNTER — Other Ambulatory Visit (HOSPITAL_BASED_OUTPATIENT_CLINIC_OR_DEPARTMENT_OTHER): Payer: Self-pay

## 2023-03-25 ENCOUNTER — Encounter: Payer: Self-pay | Admitting: Medical-Surgical

## 2023-03-25 ENCOUNTER — Other Ambulatory Visit: Payer: Self-pay

## 2023-03-25 DIAGNOSIS — F419 Anxiety disorder, unspecified: Secondary | ICD-10-CM

## 2023-03-25 DIAGNOSIS — F339 Major depressive disorder, recurrent, unspecified: Secondary | ICD-10-CM

## 2023-03-25 DIAGNOSIS — F411 Generalized anxiety disorder: Secondary | ICD-10-CM

## 2023-03-25 MED ORDER — VILAZODONE HCL 20 MG PO TABS
ORAL_TABLET | ORAL | 0 refills | Status: DC
  Filled 2023-03-25: qty 30, 30d supply, fill #0

## 2023-04-01 ENCOUNTER — Other Ambulatory Visit: Payer: Self-pay | Admitting: Medical-Surgical

## 2023-04-04 ENCOUNTER — Other Ambulatory Visit (HOSPITAL_BASED_OUTPATIENT_CLINIC_OR_DEPARTMENT_OTHER): Payer: Self-pay

## 2023-04-04 ENCOUNTER — Other Ambulatory Visit: Payer: Self-pay

## 2023-04-04 ENCOUNTER — Encounter: Payer: Self-pay | Admitting: Medical-Surgical

## 2023-04-04 MED ORDER — BELSOMRA 10 MG PO TABS: 1.0000 | ORAL_TABLET | Freq: Every day | ORAL | 0 refills | Status: DC

## 2023-04-04 MED ORDER — BELSOMRA 10 MG PO TABS
1.0000 | ORAL_TABLET | Freq: Every day | ORAL | 0 refills | Status: DC
  Filled 2023-04-04: qty 30, 30d supply, fill #0

## 2023-04-05 ENCOUNTER — Other Ambulatory Visit (HOSPITAL_BASED_OUTPATIENT_CLINIC_OR_DEPARTMENT_OTHER): Payer: Self-pay

## 2023-04-13 ENCOUNTER — Other Ambulatory Visit: Payer: Self-pay | Admitting: Family

## 2023-04-13 DIAGNOSIS — D75839 Thrombocytosis, unspecified: Secondary | ICD-10-CM

## 2023-04-15 ENCOUNTER — Other Ambulatory Visit: Payer: Self-pay | Admitting: Medical-Surgical

## 2023-04-15 ENCOUNTER — Encounter: Payer: Self-pay | Admitting: Family

## 2023-04-15 ENCOUNTER — Encounter: Payer: Self-pay | Admitting: Medical-Surgical

## 2023-04-15 ENCOUNTER — Inpatient Hospital Stay: Payer: Commercial Managed Care - PPO | Attending: Hematology & Oncology

## 2023-04-15 ENCOUNTER — Inpatient Hospital Stay (HOSPITAL_BASED_OUTPATIENT_CLINIC_OR_DEPARTMENT_OTHER): Payer: Commercial Managed Care - PPO | Admitting: Family

## 2023-04-15 VITALS — BP 129/71 | HR 85 | Temp 98.2°F | Resp 18 | Ht 65.0 in | Wt 226.0 lb

## 2023-04-15 DIAGNOSIS — D75839 Thrombocytosis, unspecified: Secondary | ICD-10-CM

## 2023-04-15 DIAGNOSIS — D509 Iron deficiency anemia, unspecified: Secondary | ICD-10-CM | POA: Diagnosis not present

## 2023-04-15 DIAGNOSIS — Z79899 Other long term (current) drug therapy: Secondary | ICD-10-CM | POA: Diagnosis not present

## 2023-04-15 LAB — CMP (CANCER CENTER ONLY)
ALT: 54 U/L — ABNORMAL HIGH (ref 0–44)
AST: 31 U/L (ref 15–41)
Albumin: 4.2 g/dL (ref 3.5–5.0)
Alkaline Phosphatase: 143 U/L — ABNORMAL HIGH (ref 38–126)
Anion gap: 10 (ref 5–15)
BUN: 11 mg/dL (ref 6–20)
CO2: 24 mmol/L (ref 22–32)
Calcium: 9.2 mg/dL (ref 8.9–10.3)
Chloride: 103 mmol/L (ref 98–111)
Creatinine: 0.76 mg/dL (ref 0.44–1.00)
GFR, Estimated: >60 mL/min (ref >60)
Glucose, Bld: 104 mg/dL — ABNORMAL HIGH (ref 70–99)
Potassium: 3.8 mmol/L (ref 3.5–5.1)
Sodium: 137 mmol/L (ref 135–145)
Total Bilirubin: 0.3 mg/dL (ref 0.0–1.2)
Total Protein: 6.8 g/dL (ref 6.5–8.1)

## 2023-04-15 LAB — CBC WITH DIFFERENTIAL (CANCER CENTER ONLY)
Abs Immature Granulocytes: 0.07 10*3/uL (ref 0.00–0.07)
Basophils Absolute: 0.1 10*3/uL (ref 0.0–0.1)
Basophils Relative: 1 %
Eosinophils Absolute: 0.3 10*3/uL (ref 0.0–0.5)
Eosinophils Relative: 2 %
HCT: 37.6 % (ref 36.0–46.0)
Hemoglobin: 12.7 g/dL (ref 12.0–15.0)
Immature Granulocytes: 1 %
Lymphocytes Relative: 29 %
Lymphs Abs: 4 10*3/uL (ref 0.7–4.0)
MCH: 31.2 pg (ref 26.0–34.0)
MCHC: 33.8 g/dL (ref 30.0–36.0)
MCV: 92.4 fL (ref 80.0–100.0)
Monocytes Absolute: 0.8 10*3/uL (ref 0.1–1.0)
Monocytes Relative: 6 %
Neutro Abs: 8.3 10*3/uL — ABNORMAL HIGH (ref 1.7–7.7)
Neutrophils Relative %: 61 %
Platelet Count: 397 10*3/uL (ref 150–400)
RBC: 4.07 MIL/uL (ref 3.87–5.11)
RDW: 13.7 % (ref 11.5–15.5)
WBC Count: 13.4 10*3/uL — ABNORMAL HIGH (ref 4.0–10.5)
nRBC: 0 % (ref 0.0–0.2)

## 2023-04-15 LAB — FERRITIN: Ferritin: 15 ng/mL (ref 11–307)

## 2023-04-15 LAB — SAVE SMEAR(SSMR), FOR PROVIDER SLIDE REVIEW

## 2023-04-15 NOTE — Progress Notes (Unsigned)
Hematology/Oncology Consultation   Name: Tracey Patterson      MRN: [id number]    Location: Room/bed info not found  Date: 04/15/2023 Time:4:27 PM   REFERRING PHYSICIAN:  Christen Butter, NP  REASON FOR CONSULT:  Thrombocytosis    DIAGNOSIS: Thrombocytosis   HISTORY OF PRESENT ILLNESS: Tracey Patterson is a very pleasant 43 yo caucasian female with mild thrombocytopenia over the last several years.  She is symptomatic with fatigued, weakness, dizziness, SOB with exertion, palpitations and numbness and tingling in her feet that comes and goes.  She is also craving salty foods.  She has had issues with IBS-C for a long time. This does effect her appetite with flares.  She was having nausea with abdominal pain and constipation and was taking Linzess. She now has loose stool with abdominal pain and nausea and takes Bentyl.  Colonoscopy in 10/2019 showed a few small mouthed diverticula in the sigmoid colon and small non bleeding internal hemorrhoids. EGD in 12/2021 showed a mildly tortuous esophagus which was dilated, gastritis and multiple gastric polyps that were resected (benign). Abdominal US in 12/2021 was negative.  No known issues with her liver or spleen.  She had her IUD removed back in October 2024 and now her cycle is regular. Her flow is heavy for the first several days and then she will spot up until day 8-9.  No other blood loss noted. No abnormal bruising, no petechiae.  She has 2 children and history of 1 miscarriage.  She has had surgery on her back, a tubal ligation, tonsillectomy as well as her wisdom teeth extracted without any complications.  No personal history of cancer. Her maternal uncle has history of leukemia.  No history of diabetes or thyroid disease.  She had Covid last in September 2022 and again in July 2023.  No fever, chills, n/v, cough, rash, chest pain, abdominal pain or changes in bowel or bladder habits.  No swelling or tenderness in her extremities.  No falls or  syncope.  Appetite and hydration are improved. Weight is 226 lbs.  No smoking or recreational drug use. Rare ETOH socially.  She stays busy with her sweet family and also works as an Regulatory affairs officer.   ROS: All other 10 point review of systems is negative.   PAST MEDICAL HISTORY:   Past Medical History:  Diagnosis Date   Anxiety    Arthritis    Asthma    Chronic headache    COVID-19 11/2020   Depression    Fibromyalgia    GERD (gastroesophageal reflux disease)    History of TMJ disorder    IBS (irritable bowel syndrome)    Obesity    Pneumonia     ALLERGIES: Allergies  Allergen Reactions   Crab [Shellfish Allergy] Hives and Itching    Allergist advises against eating crab.   Zoloft [Sertraline Hcl] Other (See Comments)    Hair loss   Other Hives, Itching and Rash    Dermabond, steri strips   Sulfa Antibiotics Hives    Full body hives.      MEDICATIONS:  Current Outpatient Medications on File Prior to Visit  Medication Sig Dispense Refill   albuterol (VENTOLIN HFA) 108 (90 Base) MCG/ACT inhaler Inhale 2 puffs into the lungs every 6 (six) hours as needed for wheezing or shortness of breath. 6.7 g 0   buPROPion (WELLBUTRIN XL) 150 MG 24 hr tablet Take 1 tablet (150 mg total) by mouth daily. 90 tablet 1   buPROPion (WELLBUTRIN XL)  300 MG 24 hr tablet Take 1 tablet (300 mg total) by mouth daily. 90 tablet 3   cariprazine (VRAYLAR) 3 MG capsule Take 1 capsule (3 mg total) by mouth daily. 90 capsule 1   clonazePAM (KLONOPIN) 0.5 MG tablet Take 1/2 - 1 tablet (0.25-0.5 mg total) by mouth 2 (two) times daily as needed for anxiety. 30 tablet 0   dicyclomine (BENTYL) 20 MG tablet Take 1 tablet (20 mg total) by mouth 3 (three) times daily as needed for spasms. 50 tablet 0   fluticasone (FLONASE) 50 MCG/ACT nasal spray Place 2 sprays into both nostrils daily. 16 g 6   methocarbamol (ROBAXIN) 500 MG tablet Take 1 tablet (500 mg total) by mouth 3 (three) times daily. 90 tablet 0    metoCLOPramide (REGLAN) 10 MG tablet Take 1 tablet (10 mg total) by mouth 4 (four) times daily as needed for nausea. 120 tablet 0   metoprolol succinate (TOPROL-XL) 25 MG 24 hr tablet Take 1 tablet (25 mg total) by mouth daily. 90 tablet 3   Multiple Vitamin tablet Take 1 tablet by mouth daily.     pantoprazole (PROTONIX) 40 MG tablet Take 1 tablet (40 mg total) by mouth 2 (two) times daily. 90 tablet 3   pregabalin (LYRICA) 50 MG capsule Take 1 capsule by mouth at bedtime for one week then take 1 capsule twice daily for one week then take 1 capsule three times daily. 90 capsule 3   Suvorexant (BELSOMRA) 10 MG TABS Take 1 tablet (10 mg total) by mouth at bedtime. 30 tablet 0   Vilazodone HCl 20 MG TABS Take 0.5 tablets (10 mg total) by mouth daily for 7 days, THEN 1 tablet (20 mg total) daily for 23 days. 30 tablet 0   VITAMIN D, CHOLECALCIFEROL, PO Take 5,000 Int'l Units by mouth daily.     [DISCONTINUED] amitriptyline (ELAVIL) 75 MG tablet Take 1 tablet (75 mg total) by mouth at bedtime as needed for sleep (pain). (Patient not taking: Reported on 04/15/2023) 30 tablet 1   No current facility-administered medications on file prior to visit.     PAST SURGICAL HISTORY Past Surgical History:  Procedure Laterality Date   BACK SURGERY  December 2015   KNEE ARTHROSCOPY  1998   left   LAPAROSCOPIC TUBAL LIGATION  02/28/2012   Procedure: LAPAROSCOPIC TUBAL LIGATION;  Surgeon: Jeani Hawking, MD;  Location: [facility];  Service: Gynecology;  Laterality: Bilateral;   TONSILLECTOMY      FAMILY HISTORY: Family History  Problem Relation Age of Onset   Hypertension Mother    Depression Mother    Hypertension Father    Breast cancer Maternal Aunt    Prostate cancer Maternal Uncle    Prostate cancer Maternal Uncle    Colon cancer Maternal Uncle    Esophageal cancer Neg Hx    Stomach cancer Neg Hx    Rectal cancer Neg Hx     SOCIAL HISTORY:  reports that she has never smoked. She has never used  smokeless tobacco. She reports that she does not drink alcohol and does not use drugs.  PERFORMANCE STATUS: The patient's performance status is 1 - Symptomatic but completely ambulatory  PHYSICAL EXAM: Most Recent Vital Signs: Blood pressure 129/71, pulse 85, temperature 98.2 F (36.8 C), temperature source Oral, resp. rate 18, height 5\' 5"  (1.651 m), weight 226 lb (102.5 kg), SpO2 100%. BP 129/71 (BP Location: Left Arm, Patient Position: Sitting)   Pulse 85   Temp 98.2 F (  36.8 C) (Oral)   Resp 18   Ht 5\' 5"  (1.651 m)   Wt 226 lb (102.5 kg)   SpO2 100%   BMI 37.61 kg/m   General Appearance:    Alert, cooperative, no distress, appears stated age  Head:    Normocephalic, without obvious abnormality, atraumatic  Eyes:    PERRL, conjunctiva/corneas clear, EOM's intact, fundi    benign, both eyes        Throat:   Lips, mucosa, and tongue normal; teeth and gums normal  Neck:   Supple, symmetrical, trachea midline, no adenopathy;    thyroid:  no enlargement/tenderness/nodules; no carotid   bruit or JVD  Back:     Symmetric, no curvature, ROM normal, no CVA tenderness  Lungs:     Clear to auscultation bilaterally, respirations unlabored  Chest Wall:    No tenderness or deformity   Heart:    Regular rate and rhythm, S1 and S2 normal, no murmur, rub   or gallop     Abdomen:     Soft, non-tender, bowel sounds active all four quadrants,    no masses, no organomegaly        Extremities:   Extremities normal, atraumatic, no cyanosis or edema  Pulses:   2+ and symmetric all extremities  Skin:   Skin color, texture, turgor normal, no rashes or lesions  Lymph nodes:   Cervical, supraclavicular, and axillary nodes normal  Neurologic:   CNII-XII intact, normal strength, sensation and reflexes    throughout    LABORATORY DATA:  Results for orders placed or performed in visit on 04/15/23 (from the past 48 hours)  CBC with Differential (Cancer Center Only)     Status: Abnormal    Collection Time: 04/15/23  2:31 PM  Result Value Ref Range   WBC Count 13.4 (H) 4.0 - 10.5 K/uL   RBC 4.07 3.87 - 5.11 MIL/uL   Hemoglobin 12.7 12.0 - 15.0 g/dL   HCT 16.1 09.6 - 04.5 %   MCV 92.4 80.0 - 100.0 fL   MCH 31.2 26.0 - 34.0 pg   MCHC 33.8 30.0 - 36.0 g/dL   RDW 40.9 81.1 - 91.4 %   Platelet Count 397 150 - 400 K/uL   nRBC 0.0 0.0 - 0.2 %   Neutrophils Relative % 61 %   Neutro Abs 8.3 (H) 1.7 - 7.7 K/uL   Lymphocytes Relative 29 %   Lymphs Abs 4.0 0.7 - 4.0 K/uL   Monocytes Relative 6 %   Monocytes Absolute 0.8 0.1 - 1.0 K/uL   Eosinophils Relative 2 %   Eosinophils Absolute 0.3 0.0 - 0.5 K/uL   Basophils Relative 1 %   Basophils Absolute 0.1 0.0 - 0.1 K/uL   Immature Granulocytes 1 %   Abs Immature Granulocytes 0.07 0.00 - 0.07 K/uL    Comment: Performed at [facility] at [facility], [address] [place], [place] [address]  CMP (Cancer Center only)     Status: Abnormal   Collection Time: 04/15/23  2:31 PM  Result Value Ref Range   Sodium 137 135 - 145 mmol/L   Potassium 3.8 3.5 - 5.1 mmol/L   Chloride 103 98 - 111 mmol/L   CO2 24 22 - 32 mmol/L   Glucose, Bld 104 (H) 70 - 99 mg/dL    Comment: Glucose reference range applies only to samples taken after fasting for at least 8 hours.   BUN 11 6 - 20 mg/dL  Creatinine 0.76 0.44 - 1.00 mg/dL   Calcium 9.2 8.9 - 81.1 mg/dL   Total Protein 6.8 6.5 - 8.1 g/dL   Albumin 4.2 3.5 - 5.0 g/dL   AST 31 15 - 41 U/L   ALT 54 (H) 0 - 44 U/L   Alkaline Phosphatase 143 (H) 38 - 126 U/L   Total Bilirubin 0.3 0.0 - 1.2 mg/dL   GFR, Estimated >91 >47 mL/min    Comment: (NOTE) Calculated using the CKD-EPI Creatinine Equation (2021)    Anion gap 10 5 - 15    Comment: Performed at [facility] at [facility], [address] [place], [place] [address]  Save Smear for Provider Slide Review     Status: None   Collection Time: 04/15/23  2:31 PM  Result Value Ref Range    Smear Review SMEAR STAINED AND AVAILABLE FOR REVIEW     Comment: Performed at [facility] at [facility], [address], [place], [place] [address]      RADIOGRAPHY: No results found.     PATHOLOGY: None  ASSESSMENT/PLAN: Ms. Roloff is a very pleasant 43 yo caucasian female with mild thrombocytopenia over the last several years.  Her iron saturation and ferritin are both low. I do feel that based on her symptoms she would benefit from IV iron and that elevated platelets are reactive secondary to the low iron.  Blood smear reviewed with Dr. Myna Hidalgo and no abnormality or evidence of malignancy noted.  We will get her set up for 3 doses of IV iron.  Follow-up in 8 weeks.   All questions were answered. The patient knows to call the clinic with any problems, questions or concerns. We can certainly see the patient much sooner if necessary.  The patient was discussed with Dr. Myna Hidalgo and he is in agreement with the aforementioned.   Eileen Stanford, NP

## 2023-04-18 ENCOUNTER — Encounter: Payer: Self-pay | Admitting: Family

## 2023-04-18 ENCOUNTER — Telehealth: Payer: Self-pay | Admitting: Family

## 2023-04-18 ENCOUNTER — Other Ambulatory Visit: Payer: Self-pay | Admitting: Medical-Surgical

## 2023-04-18 DIAGNOSIS — D509 Iron deficiency anemia, unspecified: Secondary | ICD-10-CM | POA: Insufficient documentation

## 2023-04-18 DIAGNOSIS — F32A Depression, unspecified: Secondary | ICD-10-CM

## 2023-04-18 LAB — IRON AND IRON BINDING CAPACITY (CC-WL,HP ONLY)
Iron: 49 ug/dL (ref 28–170)
Saturation Ratios: 12 % (ref 10.4–31.8)
TIBC: 414 ug/dL (ref 250–450)
UIBC: 365 ug/dL (ref 148–442)

## 2023-04-18 NOTE — Telephone Encounter (Signed)
Called to schedule follow up and IV Iron. LVM to return call for scheduling.

## 2023-04-19 ENCOUNTER — Encounter (HOSPITAL_COMMUNITY): Payer: Self-pay

## 2023-04-19 ENCOUNTER — Other Ambulatory Visit (HOSPITAL_BASED_OUTPATIENT_CLINIC_OR_DEPARTMENT_OTHER): Payer: Self-pay

## 2023-04-19 ENCOUNTER — Encounter: Payer: Self-pay | Admitting: Family

## 2023-04-19 ENCOUNTER — Ambulatory Visit (INDEPENDENT_AMBULATORY_CARE_PROVIDER_SITE_OTHER): Payer: Self-pay | Admitting: Licensed Clinical Social Worker

## 2023-04-19 DIAGNOSIS — Z0389 Encounter for observation for other suspected diseases and conditions ruled out: Secondary | ICD-10-CM

## 2023-04-19 MED ORDER — VILAZODONE HCL 20 MG PO TABS
20.0000 mg | ORAL_TABLET | Freq: Every day | ORAL | 0 refills | Status: DC
  Filled 2023-04-19: qty 30, 30d supply, fill #0

## 2023-04-19 NOTE — Progress Notes (Signed)
Patient late cancel for assessment due to being sick.

## 2023-04-22 ENCOUNTER — Inpatient Hospital Stay: Payer: Commercial Managed Care - PPO

## 2023-04-22 VITALS — BP 121/80 | HR 93 | Temp 97.7°F | Resp 17

## 2023-04-22 DIAGNOSIS — D509 Iron deficiency anemia, unspecified: Secondary | ICD-10-CM | POA: Diagnosis not present

## 2023-04-22 DIAGNOSIS — Z79899 Other long term (current) drug therapy: Secondary | ICD-10-CM | POA: Diagnosis not present

## 2023-04-22 MED ORDER — SODIUM CHLORIDE 0.9 % IV SOLN: INTRAVENOUS | Status: DC

## 2023-04-22 MED ORDER — SODIUM CHLORIDE 0.9 % IV SOLN
300.0000 mg | Freq: Once | INTRAVENOUS | Status: AC
  Administered 2023-04-22: 300 mg via INTRAVENOUS
  Filled 2023-04-22: qty 300

## 2023-04-22 NOTE — Patient Instructions (Signed)

## 2023-04-29 MED ORDER — VILAZODONE HCL 40 MG PO TABS
40.0000 mg | ORAL_TABLET | Freq: Every day | ORAL | 0 refills | Status: DC
  Filled 2023-04-29: qty 90, 90d supply, fill #0

## 2023-04-30 ENCOUNTER — Other Ambulatory Visit: Payer: Self-pay

## 2023-05-02 ENCOUNTER — Other Ambulatory Visit (HOSPITAL_BASED_OUTPATIENT_CLINIC_OR_DEPARTMENT_OTHER): Payer: Self-pay

## 2023-05-02 ENCOUNTER — Inpatient Hospital Stay: Payer: Commercial Managed Care - PPO | Attending: Hematology & Oncology

## 2023-05-02 VITALS — BP 119/82 | HR 72 | Temp 98.7°F | Resp 19

## 2023-05-02 DIAGNOSIS — D509 Iron deficiency anemia, unspecified: Secondary | ICD-10-CM | POA: Diagnosis not present

## 2023-05-02 MED ORDER — SODIUM CHLORIDE 0.9 % IV SOLN: INTRAVENOUS | Status: DC

## 2023-05-02 MED ORDER — SODIUM CHLORIDE 0.9 % IV SOLN
300.0000 mg | Freq: Once | INTRAVENOUS | Status: AC
  Administered 2023-05-02: 300 mg via INTRAVENOUS
  Filled 2023-05-02: qty 300

## 2023-05-02 NOTE — Patient Instructions (Signed)

## 2023-05-05 ENCOUNTER — Other Ambulatory Visit (HOSPITAL_BASED_OUTPATIENT_CLINIC_OR_DEPARTMENT_OTHER): Payer: Self-pay

## 2023-05-05 ENCOUNTER — Encounter: Payer: Self-pay | Admitting: Medical-Surgical

## 2023-05-05 ENCOUNTER — Other Ambulatory Visit: Payer: Self-pay | Admitting: Gastroenterology

## 2023-05-05 ENCOUNTER — Other Ambulatory Visit: Payer: Self-pay | Admitting: Medical-Surgical

## 2023-05-05 MED ORDER — PANTOPRAZOLE SODIUM 40 MG PO TBEC
40.0000 mg | DELAYED_RELEASE_TABLET | Freq: Two times a day (BID) | ORAL | 0 refills | Status: DC
  Filled 2023-05-05: qty 180, 90d supply, fill #0

## 2023-05-05 MED ORDER — BELSOMRA 10 MG PO TABS
1.0000 | ORAL_TABLET | Freq: Every day | ORAL | 0 refills | Status: DC
  Filled 2023-05-05: qty 30, 30d supply, fill #0

## 2023-05-09 ENCOUNTER — Inpatient Hospital Stay: Payer: Commercial Managed Care - PPO

## 2023-05-09 VITALS — BP 119/70 | HR 72 | Temp 98.4°F | Resp 18

## 2023-05-09 DIAGNOSIS — D509 Iron deficiency anemia, unspecified: Secondary | ICD-10-CM

## 2023-05-09 MED ORDER — SODIUM CHLORIDE 0.9 % IV SOLN: INTRAVENOUS | Status: DC

## 2023-05-09 MED ORDER — SODIUM CHLORIDE 0.9 % IV SOLN
300.0000 mg | Freq: Once | INTRAVENOUS | Status: AC
  Administered 2023-05-09: 300 mg via INTRAVENOUS
  Filled 2023-05-09: qty 300

## 2023-05-09 NOTE — Patient Instructions (Signed)

## 2023-05-15 ENCOUNTER — Encounter: Payer: Self-pay | Admitting: Medical-Surgical

## 2023-05-15 DIAGNOSIS — F339 Major depressive disorder, recurrent, unspecified: Secondary | ICD-10-CM

## 2023-05-15 DIAGNOSIS — F411 Generalized anxiety disorder: Secondary | ICD-10-CM

## 2023-05-16 ENCOUNTER — Encounter (HOSPITAL_COMMUNITY): Payer: Self-pay

## 2023-05-16 ENCOUNTER — Other Ambulatory Visit (HOSPITAL_BASED_OUTPATIENT_CLINIC_OR_DEPARTMENT_OTHER): Payer: Self-pay

## 2023-05-16 ENCOUNTER — Ambulatory Visit (INDEPENDENT_AMBULATORY_CARE_PROVIDER_SITE_OTHER): Payer: Self-pay | Admitting: Licensed Clinical Social Worker

## 2023-05-16 DIAGNOSIS — Z91199 Patient's noncompliance with other medical treatment and regimen due to unspecified reason: Secondary | ICD-10-CM

## 2023-05-16 MED ORDER — BUPROPION HCL ER (XL) 150 MG PO TB24
150.0000 mg | ORAL_TABLET | Freq: Every day | ORAL | 1 refills | Status: DC
  Filled 2023-05-16: qty 90, 90d supply, fill #0

## 2023-05-16 MED ORDER — BUPROPION HCL ER (XL) 300 MG PO TB24
300.0000 mg | ORAL_TABLET | Freq: Every day | ORAL | 1 refills | Status: DC
  Filled 2023-05-16: qty 90, 90d supply, fill #0

## 2023-05-16 NOTE — Progress Notes (Signed)
 Patient did not show for appointment.

## 2023-05-27 ENCOUNTER — Other Ambulatory Visit: Payer: Self-pay

## 2023-05-30 ENCOUNTER — Telehealth: Admitting: Family Medicine

## 2023-05-30 ENCOUNTER — Other Ambulatory Visit: Payer: Self-pay

## 2023-05-30 ENCOUNTER — Other Ambulatory Visit (HOSPITAL_BASED_OUTPATIENT_CLINIC_OR_DEPARTMENT_OTHER): Payer: Self-pay

## 2023-05-30 DIAGNOSIS — B9689 Other specified bacterial agents as the cause of diseases classified elsewhere: Secondary | ICD-10-CM | POA: Diagnosis not present

## 2023-05-30 DIAGNOSIS — J208 Acute bronchitis due to other specified organisms: Secondary | ICD-10-CM | POA: Diagnosis not present

## 2023-05-30 MED ORDER — PREDNISONE 10 MG PO TABS
ORAL_TABLET | ORAL | 0 refills | Status: AC
  Filled 2023-05-30: qty 21, 6d supply, fill #0

## 2023-05-30 MED ORDER — BENZONATATE 100 MG PO CAPS
100.0000 mg | ORAL_CAPSULE | Freq: Three times a day (TID) | ORAL | 0 refills | Status: DC | PRN
  Filled 2023-05-30: qty 30, 10d supply, fill #0

## 2023-05-30 MED ORDER — AZITHROMYCIN 250 MG PO TABS
ORAL_TABLET | ORAL | 0 refills | Status: AC
  Filled 2023-05-30: qty 6, 5d supply, fill #0

## 2023-05-30 NOTE — Progress Notes (Signed)
 E-Visit for Cough   We are sorry that you are not feeling well.  Here is how we plan to help!  Based on your presentation I believe you most likely have A cough due to bacteria.  When patients have a fever and a productive cough with a change in color or increased sputum production, we are concerned about bacterial bronchitis.  If left untreated it can progress to pneumonia.  If your symptoms do not improve with your treatment plan it is important that you contact your provider.   I have prescribed Azithromyin 250 mg: two tablets now and then one tablet daily for 4 additonal days    In addition you may use A non-prescription cough medication called Mucinex DM: take 2 tablets every 12 hours. and A prescription cough medication called Tessalon Perles 100mg . You may take 1-2 capsules every 8 hours as needed for your cough.  Prednisone 10 mg daily for 6 days (see taper instructions below)  Directions for 6 day taper: Day 1: 2 tablets before breakfast, 1 after both lunch & dinner and 2 at bedtime Day 2: 1 tab before breakfast, 1 after both lunch & dinner and 2 at bedtime Day 3: 1 tab at each meal & 1 at bedtime Day 4: 1 tab at breakfast, 1 at lunch, 1 at bedtime Day 5: 1 tab at breakfast & 1 tab at bedtime Day 6: 1 tab at breakfast  From your responses in the eVisit questionnaire you describe inflammation in the upper respiratory tract which is causing a significant cough.  This is commonly called Bronchitis and has four common causes:   Allergies Viral Infections Acid Reflux Bacterial Infection Allergies, viruses and acid reflux are treated by controlling symptoms or eliminating the cause. An example might be a cough caused by taking certain blood pressure medications. You stop the cough by changing the medication. Another example might be a cough caused by acid reflux. Controlling the reflux helps control the cough.  USE OF BRONCHODILATOR ("RESCUE") INHALERS: There is a risk from using your  bronchodilator too frequently.  The risk is that over-reliance on a medication which only relaxes the muscles surrounding the breathing tubes can reduce the effectiveness of medications prescribed to reduce swelling and congestion of the tubes themselves.  Although you feel brief relief from the bronchodilator inhaler, your asthma may actually be worsening with the tubes becoming more swollen and filled with mucus.  This can delay other crucial treatments, such as oral steroid medications. If you need to use a bronchodilator inhaler daily, several times per day, you should discuss this with your provider.  There are probably better treatments that could be used to keep your asthma under control.     HOME CARE Only take medications as instructed by your medical team. Complete the entire course of an antibiotic. Drink plenty of fluids and get plenty of rest. Avoid close contacts especially the very young and the elderly Cover your mouth if you cough or cough into your sleeve. Always remember to wash your hands A steam or ultrasonic humidifier can help congestion.   GET HELP RIGHT AWAY IF: You develop worsening fever. You become short of breath You cough up blood. Your symptoms persist after you have completed your treatment plan MAKE SURE YOU  Understand these instructions. Will watch your condition. Will get help right away if you are not doing well or get worse.    Thank you for choosing an e-visit.  Your e-visit answers were reviewed by  a board certified advanced clinical practitioner to complete your personal care plan. Depending upon the condition, your plan could have included both over the counter or prescription medications.  Please review your pharmacy choice. Make sure the pharmacy is open so you can pick up prescription now. If there is a problem, you may contact your provider through [employer] and have the prescription routed to another pharmacy.  Your safety is important  to [place]. If you have drug allergies check your prescription carefully.   For the next 24 hours you can use MyChart to ask questions about today's visit, request a non-urgent call back, or ask for a work or school excuse. You will get an email in the next two days asking about your experience. I hope that your e-visit has been valuable and will speed your recovery.      I provided 5 minutes of non face-to-face time during this encounter for chart review, medication and order placement, as well as and documentation.

## 2023-06-08 ENCOUNTER — Ambulatory Visit (INDEPENDENT_AMBULATORY_CARE_PROVIDER_SITE_OTHER): Payer: 59 | Admitting: Medical-Surgical

## 2023-06-08 ENCOUNTER — Other Ambulatory Visit: Payer: Self-pay | Admitting: Medical-Surgical

## 2023-06-08 VITALS — BP 101/70 | HR 76 | Resp 20 | Ht 65.0 in | Wt 225.4 lb

## 2023-06-08 DIAGNOSIS — F32A Depression, unspecified: Secondary | ICD-10-CM | POA: Diagnosis not present

## 2023-06-08 DIAGNOSIS — R0683 Snoring: Secondary | ICD-10-CM

## 2023-06-08 DIAGNOSIS — F411 Generalized anxiety disorder: Secondary | ICD-10-CM

## 2023-06-08 DIAGNOSIS — R030 Elevated blood-pressure reading, without diagnosis of hypertension: Secondary | ICD-10-CM | POA: Diagnosis not present

## 2023-06-08 DIAGNOSIS — G4719 Other hypersomnia: Secondary | ICD-10-CM

## 2023-06-08 DIAGNOSIS — M797 Fibromyalgia: Secondary | ICD-10-CM | POA: Diagnosis not present

## 2023-06-08 DIAGNOSIS — F419 Anxiety disorder, unspecified: Secondary | ICD-10-CM

## 2023-06-08 NOTE — Progress Notes (Signed)
 Subjective:  Patient ID: Tracey Patterson, female    DOB: [birth year], 43 y.o.   MRN: [id number]  Patient Care Team: Christen Butter, NP as PCP - General (Nurse Practitioner) Lewayne Bunting, MD as PCP - Cardiology (Cardiology)   Chief Complaint:  Hypertension   HPI:  Tracey Patterson is a 43 y.o. female presenting on 06/08/2023 for Hypertension   History, Exam,  Impression and Plan HPI  1. Elevated blood pressure reading (Primary) Blood pressure is well managed at this time Patient taking metoprolol  25mg  PO daily without problems or side effects. Denies palpitations cheest pain dizziness/bluury vision or lower extremity edema.   2. Fibromyalgia Reports generally well managed with robaxin 500mg   daily PRN last dose, Pregabulin  50mg  PO daily PRN last dose last month.   3. Generalized anxiety disorder 4. Anxiety and depression Presents with reports of many stressors and worry about pain, medical conditions, teenaged children driving, work stressors, and libido.  Reports decreased libido and difficulty with spouse.  PHQ 9 = 13 and GAD-7 = 13.  Patient reports excessive sleeping on days off and will go back to bed after taking kids to school after 9 hours of sleep.  Patient reports sleeping over 12 hours on days off and still feeling very tired and fatigued.  Patient interested in decreasing and stopping bupropion because it is listed her genetic list as use with caution.  When asked about attending counseling patient admits to not going and acknowledges she needs to go and discuss situational anxiety issues.  5. Excessive daytime sleepiness 6. Loud snoring Patient reports excessive sleeping on days off and will go back to bed after taking kids to school after 9 hours of sleep.  Patient reports sleeping over 12 hours on days off and still feeling very tired and fatigued.  Patient has been told that she snores but is unsure if she has apneic pauses.  Referral for home sleep test. -      Home sleep test; Future     Continue all other maintenance medications.  Follow up plan: Return in about 4 weeks (around 07/06/2023).   Relevant past medical, surgical, family, and social history reviewed and updated as indicated.  Allergies and medications reviewed and updated. Data reviewed: Chart in Epic.   Past Medical History:  Diagnosis Date   Anxiety    Arthritis    Asthma    Chronic headache    COVID-19 11/2020   Depression    Fibromyalgia    GERD (gastroesophageal reflux disease)    History of TMJ disorder    IBS (irritable bowel syndrome)    Obesity    Pneumonia     Past Surgical History:  Procedure Laterality Date   BACK SURGERY  December 2015   KNEE ARTHROSCOPY  1998   left   LAPAROSCOPIC TUBAL LIGATION  02/28/2012   Procedure: LAPAROSCOPIC TUBAL LIGATION;  Surgeon: Jeani Hawking, MD;  Location: [facility];  Service: Gynecology;  Laterality: Bilateral;   TONSILLECTOMY      Social History   Socioeconomic History   Marital status: Married    Spouse name: Not on file   Number of children: 3   Years of education: Not on file   Highest education level: Master's degree (e.g., MA, MS, MEng, MEd, MSW, MBA)  Occupational History   Not on file  Tobacco Use   Smoking status: Never   Smokeless tobacco: Never  Vaping Use   Vaping status: Never Used  Substance  and Sexual Activity   Alcohol use: Never   Drug use: No   Sexual activity: Not on file  Other Topics Concern   Not on file  Social History Narrative   Marital Status: Married Probation officer)    Children:  Son Aeronautical engineer) Daughter Johny Sax)    Pets: Dogs (1) Cats (2)    Living Situation: Lives with  husband and children    Occupation: Radiology Tech - [employer]    Education: Bachelor's Degree in Science    Tobacco Use/Exposure:  None    Alcohol Use:  Occasional   Drug Use:  None   Diet:  Regular   Exercise:  None   Hobbies:  Reading          Social Drivers of Health   Financial  Resource Strain: Low Risk  (06/08/2023)   Overall Financial Resource Strain (CARDIA)    Difficulty of Paying Living Expenses: Not very hard  Food Insecurity: No Food Insecurity (06/08/2023)   Hunger Vital Sign    Worried About Running Out of Food in the Last Year: Never true    Ran Out of Food in the Last Year: Never true  Transportation Needs: No Transportation Needs (06/08/2023)   PRAPARE - Administrator, Civil Service (Medical): No    Lack of Transportation (Non-Medical): No  Physical Activity: Insufficiently Active (06/08/2023)   Exercise Vital Sign    Days of Exercise per Week: 3 days    Minutes of Exercise per Session: 30 min  Stress: Stress Concern Present (06/08/2023)   [employer] of Occupational Health - Occupational Stress Questionnaire    Feeling of Stress : Very much  Social Connections: Moderately Isolated (06/08/2023)   Social Connection and Isolation Panel [NHANES]    Frequency of Communication with Friends and Family: More than three times a week    Frequency of Social Gatherings with Friends and Family: Once a week    Attends Religious Services: Never    Database administrator or Organizations: No    Attends Engineer, structural: Not on file    Marital Status: Married  Catering manager Violence: Not At Risk (04/15/2023)   Humiliation, Afraid, Rape, and Kick questionnaire    Fear of Current or Ex-Partner: No    Emotionally Abused: No    Physically Abused: No    Sexually Abused: No    Outpatient Encounter Medications as of 06/08/2023  Medication Sig   albuterol (VENTOLIN HFA) 108 (90 Base) MCG/ACT inhaler Inhale 2 puffs into the lungs every 6 (six) hours as needed for wheezing or shortness of breath.   buPROPion (WELLBUTRIN XL) 150 MG 24 hr tablet Take 1 tablet (150 mg total) by mouth daily.   buPROPion (WELLBUTRIN XL) 300 MG 24 hr tablet Take 1 tablet (300 mg total) by mouth daily.   cariprazine (VRAYLAR) 3 MG capsule Take 1 capsule (3 mg  total) by mouth daily.   dicyclomine (BENTYL) 20 MG tablet Take 1 tablet (20 mg total) by mouth 3 (three) times daily as needed for spasms.   fluticasone (FLONASE) 50 MCG/ACT nasal spray Place 2 sprays into both nostrils daily.   methocarbamol (ROBAXIN) 500 MG tablet Take 1 tablet (500 mg total) by mouth 3 (three) times daily.   metoCLOPramide (REGLAN) 10 MG tablet Take 1 tablet (10 mg total) by mouth 4 (four) times daily as needed for nausea.   metoprolol succinate (TOPROL-XL) 25 MG 24 hr tablet Take 1 tablet (25 mg total) by  mouth daily.   Multiple Vitamin tablet Take 1 tablet by mouth daily.   pantoprazole (PROTONIX) 40 MG tablet Take 1 tablet (40 mg total) by mouth 2 (two) times daily. *Please call [phone] to schedule an office visit for more refills.*   pregabalin (LYRICA) 50 MG capsule Take 1 capsule by mouth at bedtime for one week then take 1 capsule twice daily for one week then take 1 capsule three times daily.   Suvorexant (BELSOMRA) 10 MG TABS Take 1 tablet (10 mg total) by mouth at bedtime.   Vilazodone HCl (VIIBRYD) 40 MG TABS Take 1 tablet (40 mg total) by mouth daily.   VITAMIN D, CHOLECALCIFEROL, PO Take 5,000 Int'l Units by mouth daily.   clonazePAM (KLONOPIN) 0.5 MG tablet Take 1/2 - 1 tablet (0.25-0.5 mg total) by mouth 2 (two) times daily as needed for anxiety.   [DISCONTINUED] amitriptyline (ELAVIL) 75 MG tablet Take 1 tablet (75 mg total) by mouth at bedtime as needed for sleep (pain). (Patient not taking: Reported on 04/15/2023)   [DISCONTINUED] benzonatate (TESSALON) 100 MG capsule Take 1 capsule (100 mg total) by mouth 3 (three) times daily as needed for cough. (Patient not taking: Reported on 06/08/2023)   No facility-administered encounter medications on file as of 06/08/2023.    Allergies  Allergen Reactions   Crab [Shellfish Allergy] Hives and Itching    Allergist advises against eating crab.   Zoloft [Sertraline Hcl] Other (See Comments)    Hair loss   Other  Hives, Itching and Rash    Dermabond, steri strips   Sulfa Antibiotics Hives    Full body hives.    Review of Systems      Objective:  BP 101/70 (BP Location: Right Arm, Cuff Size: Large)   Pulse 76   Resp 20   Ht 5\' 5"  (1.651 m)   Wt 225 lb 6.4 oz (102.2 kg)   SpO2 98%   BMI 37.51 kg/m    Wt Readings from Last 3 Encounters:  06/08/23 225 lb 6.4 oz (102.2 kg)  04/15/23 226 lb (102.5 kg)  03/02/23 230 lb 12.8 oz (104.7 kg)    Physical Exam  Results for orders placed or performed in visit on 04/15/23  CBC with Differential (Cancer Center Only)   Collection Time: 04/15/23  2:31 PM  Result Value Ref Range   WBC Count 13.4 (H) 4.0 - 10.5 K/uL   RBC 4.07 3.87 - 5.11 MIL/uL   Hemoglobin 12.7 12.0 - 15.0 g/dL   HCT 09.8 11.9 - 14.7 %   MCV 92.4 80.0 - 100.0 fL   MCH 31.2 26.0 - 34.0 pg   MCHC 33.8 30.0 - 36.0 g/dL   RDW 82.9 56.2 - 13.0 %   Platelet Count 397 150 - 400 K/uL   nRBC 0.0 0.0 - 0.2 %   Neutrophils Relative % 61 %   Neutro Abs 8.3 (H) 1.7 - 7.7 K/uL   Lymphocytes Relative 29 %   Lymphs Abs 4.0 0.7 - 4.0 K/uL   Monocytes Relative 6 %   Monocytes Absolute 0.8 0.1 - 1.0 K/uL   Eosinophils Relative 2 %   Eosinophils Absolute 0.3 0.0 - 0.5 K/uL   Basophils Relative 1 %   Basophils Absolute 0.1 0.0 - 0.1 K/uL   Immature Granulocytes 1 %   Abs Immature Granulocytes 0.07 0.00 - 0.07 K/uL  CMP (Cancer Center only)   Collection Time: 04/15/23  2:31 PM  Result Value Ref Range   Sodium 137 135 -  145 mmol/L   Potassium 3.8 3.5 - 5.1 mmol/L   Chloride 103 98 - 111 mmol/L   CO2 24 22 - 32 mmol/L   Glucose, Bld 104 (H) 70 - 99 mg/dL   BUN 11 6 - 20 mg/dL   Creatinine 1.61 0.96 - 1.00 mg/dL   Calcium 9.2 8.9 - 04.5 mg/dL   Total Protein 6.8 6.5 - 8.1 g/dL   Albumin 4.2 3.5 - 5.0 g/dL   AST 31 15 - 41 U/L   ALT 54 (H) 0 - 44 U/L   Alkaline Phosphatase 143 (H) 38 - 126 U/L   Total Bilirubin 0.3 0.0 - 1.2 mg/dL   GFR, Estimated >40 >98 mL/min   Anion gap 10 5 -  15  Ferritin   Collection Time: 04/15/23  2:31 PM  Result Value Ref Range   Ferritin 15 11 - 307 ng/mL  Iron and Iron Binding Capacity ([facility] only)   Collection Time: 04/15/23  2:31 PM  Result Value Ref Range   Iron 49 28 - 170 ug/dL   TIBC 119 147 - 829 ug/dL   Saturation Ratios 12 10.4 - 31.8 %   UIBC 365 148 - 442 ug/dL  Save Smear for Provider Slide Review   Collection Time: 04/15/23  2:31 PM  Result Value Ref Range   Smear Review SMEAR STAINED AND AVAILABLE FOR REVIEW        Pertinent labs & imaging results that were available during my care of the patient were reviewed by me and considered in my medical decision making.   Continue healthy lifestyle choices, including diet (rich in fruits, vegetables, and lean proteins, and low in salt and simple carbohydrates) and exercise (at least 30 minutes of moderate physical activity daily).  The above assessment and management plan was discussed with the patient. The patient verbalized understanding of and has agreed to the management plan. Patient is aware to call the clinic if they develop any new symptoms or if symptoms persist or worsen. Patient is aware when to return to the clinic for a follow-up visit. Patient educated on when it is appropriate to go to the emergency department.   Maryelizabeth Kaufmann Student AGNP

## 2023-06-08 NOTE — Progress Notes (Signed)
 Medical screening examination/treatment was performed by qualified clinical staff member and as supervising provider I was immediately available for consultation/collaboration. I have reviewed documentation and agree with assessment and plan.  Addendum:  Elevated blood pressure: As noted, Toprol-XL 25 mg daily has her blood pressure and heart rate well-controlled.  Continue Toprol-XL as prescribed.  Monitor blood pressure at home with a goal of 130/80 or less.  Monitor heart rate with a goal of 60-99.  If blood pressure and heart rate are outside of normal parameters, return for further evaluation.  Fibromyalgia: Symptoms fairly well-managed on current regimen.  No changes in medications.  Recommend working on efforts for weight loss as previously discussed.  Would benefit from the addition of daily intentional exercise.  Mood: Continue Viibryd 40 mg daily and Vraylar 3 mg daily.  Slow taper of Wellbutrin from 450 mg daily to 300 mg daily for 1-2 weeks then if doing okay, reduce further to 150 mg daily for 1-2 weeks.  If still doing okay and wanting to discontinue the medication altogether, okay to stop at that time.  We will touch base for follow-up in 4 weeks to evaluate her response to weaning off the medication and if there are other changes that need to be made.  Excessive sleepiness: STOP-BANG criteria score is 5 out of 8 indicating high risk with high scores on Epworth sleepiness scale.  Plan for home sleep study to evaluate for sleep apnea as a contributor to the significant fatigue and excessive daytime sleepiness that she is experiencing.  Thayer Ohm, DNP, APRN, FNP-BC [facility] and [facility]

## 2023-06-09 ENCOUNTER — Encounter: Payer: Self-pay | Admitting: Medical-Surgical

## 2023-06-09 DIAGNOSIS — Z1231 Encounter for screening mammogram for malignant neoplasm of breast: Secondary | ICD-10-CM

## 2023-06-10 ENCOUNTER — Other Ambulatory Visit: Payer: Self-pay

## 2023-06-10 ENCOUNTER — Other Ambulatory Visit (HOSPITAL_BASED_OUTPATIENT_CLINIC_OR_DEPARTMENT_OTHER): Payer: Self-pay

## 2023-06-10 MED ORDER — BELSOMRA 10 MG PO TABS: 1.0000 | ORAL_TABLET | Freq: Every day | ORAL | 0 refills | Status: DC

## 2023-06-10 MED ORDER — BELSOMRA 10 MG PO TABS
1.0000 | ORAL_TABLET | Freq: Every day | ORAL | 5 refills | Status: DC
  Filled 2023-06-10: qty 30, 30d supply, fill #0

## 2023-06-13 ENCOUNTER — Inpatient Hospital Stay: Payer: Commercial Managed Care - PPO | Admitting: Medical Oncology

## 2023-06-13 ENCOUNTER — Inpatient Hospital Stay: Payer: Commercial Managed Care - PPO | Attending: Hematology & Oncology

## 2023-06-13 ENCOUNTER — Other Ambulatory Visit (HOSPITAL_BASED_OUTPATIENT_CLINIC_OR_DEPARTMENT_OTHER): Payer: Self-pay

## 2023-06-13 DIAGNOSIS — E611 Iron deficiency: Secondary | ICD-10-CM | POA: Insufficient documentation

## 2023-06-13 DIAGNOSIS — Z862 Personal history of diseases of the blood and blood-forming organs and certain disorders involving the immune mechanism: Secondary | ICD-10-CM | POA: Insufficient documentation

## 2023-06-13 DIAGNOSIS — Z79899 Other long term (current) drug therapy: Secondary | ICD-10-CM | POA: Insufficient documentation

## 2023-06-15 ENCOUNTER — Inpatient Hospital Stay: Admitting: Medical Oncology

## 2023-06-15 ENCOUNTER — Encounter: Payer: Self-pay | Admitting: Medical Oncology

## 2023-06-15 ENCOUNTER — Inpatient Hospital Stay

## 2023-06-15 VITALS — BP 121/74 | HR 75 | Temp 97.9°F | Resp 18 | Ht 65.0 in | Wt 228.8 lb

## 2023-06-15 DIAGNOSIS — D75839 Thrombocytosis, unspecified: Secondary | ICD-10-CM

## 2023-06-15 DIAGNOSIS — E611 Iron deficiency: Secondary | ICD-10-CM

## 2023-06-15 DIAGNOSIS — D509 Iron deficiency anemia, unspecified: Secondary | ICD-10-CM

## 2023-06-15 DIAGNOSIS — Z862 Personal history of diseases of the blood and blood-forming organs and certain disorders involving the immune mechanism: Secondary | ICD-10-CM | POA: Diagnosis not present

## 2023-06-15 DIAGNOSIS — Z79899 Other long term (current) drug therapy: Secondary | ICD-10-CM | POA: Diagnosis not present

## 2023-06-15 LAB — CBC WITH DIFFERENTIAL (CANCER CENTER ONLY)
Abs Immature Granulocytes: 0.02 10*3/uL (ref 0.00–0.07)
Basophils Absolute: 0.1 10*3/uL (ref 0.0–0.1)
Basophils Relative: 1 %
Eosinophils Absolute: 0.2 10*3/uL (ref 0.0–0.5)
Eosinophils Relative: 2 %
HCT: 40.8 % (ref 36.0–46.0)
Hemoglobin: 13.7 g/dL (ref 12.0–15.0)
Immature Granulocytes: 0 %
Lymphocytes Relative: 37 %
Lymphs Abs: 3.8 10*3/uL (ref 0.7–4.0)
MCH: 31.9 pg (ref 26.0–34.0)
MCHC: 33.6 g/dL (ref 30.0–36.0)
MCV: 94.9 fL (ref 80.0–100.0)
Monocytes Absolute: 0.9 10*3/uL (ref 0.1–1.0)
Monocytes Relative: 8 %
Neutro Abs: 5.3 10*3/uL (ref 1.7–7.7)
Neutrophils Relative %: 52 %
Platelet Count: 366 10*3/uL (ref 150–400)
RBC: 4.3 MIL/uL (ref 3.87–5.11)
RDW: 13.8 % (ref 11.5–15.5)
WBC Count: 10.3 10*3/uL (ref 4.0–10.5)
nRBC: 0 % (ref 0.0–0.2)

## 2023-06-15 LAB — RETICULOCYTES
Immature Retic Fract: 9 % (ref 2.3–15.9)
RBC.: 4.27 MIL/uL (ref 3.87–5.11)
Retic Count, Absolute: 97.4 10*3/uL (ref 19.0–186.0)
Retic Ct Pct: 2.3 % (ref 0.4–3.1)

## 2023-06-15 LAB — FERRITIN: Ferritin: 196 ng/mL (ref 11–307)

## 2023-06-15 NOTE — Progress Notes (Signed)
 Hematology and Oncology Follow Up Visit  Tracey Patterson [id number] [birth year] 43 y.o. 06/15/2023  Past Medical History:  Diagnosis Date   Anxiety    Arthritis    Asthma    Chronic headache    COVID-19 11/2020   Depression    Fibromyalgia    GERD (gastroesophageal reflux disease)    History of TMJ disorder    IBS (irritable bowel syndrome)    Obesity    Pneumonia     Principle Diagnosis:  Low iron secondary to malabsorption (IBS-C), gastritis Reactive thrombocytosis  Former work up Colonoscopy- 10/2019- no significant findings  Endoscopy- 12/2021- gastritis  Current Therapy:   IV Iron- Venofer 300 mg- last infusion series was (04/22/2023-05/09/2023)     Interim History:  Tracey Patterson is back for follow-up for thrombocytopenia and low iron levels.     Today she reports that she tolerated the IV infusions well. Mild fatigue the next day but otherwise no negative side effects. She has not noticed much changes in how she feels unfortunately and fatigue is still present. Still has mild SOB with exertion. Salt cravings are better though.   Menstrual cycles are average in nature. 25-26 days in interval length. She will have 2-3 days of heavy cycle with 2-3 days of light menstruation. IUD removed last summer.  There has been no bleeding to her knowledge: denies epistaxis, gingivitis, hemoptysis, hematemesis, hematuria, melena, excessive bruising, blood donation.   No fever, chills, n/v, cough, rash, chest pain, abdominal pain or changes in bowel or bladder habits.  No swelling or tenderness in her extremities.  No falls or syncope.  Appetite and hydration are improved  Wt Readings from Last 3 Encounters:  06/15/23 228 lb 12.8 oz (103.8 kg)  06/08/23 225 lb 6.4 oz (102.2 kg)  04/15/23 226 lb (102.5 kg)     Medications:   Current Outpatient Medications:    albuterol (VENTOLIN HFA) 108 (90 Base) MCG/ACT inhaler, Inhale 2 puffs into the lungs every 6 (six) hours as needed  for wheezing or shortness of breath., Disp: 6.7 g, Rfl: 0   cariprazine (VRAYLAR) 3 MG capsule, Take 1 capsule (3 mg total) by mouth daily., Disp: 90 capsule, Rfl: 1   dicyclomine (BENTYL) 20 MG tablet, Take 1 tablet (20 mg total) by mouth 3 (three) times daily as needed for spasms., Disp: 50 tablet, Rfl: 0   fluticasone (FLONASE) 50 MCG/ACT nasal spray, Place 2 sprays into both nostrils daily., Disp: 16 g, Rfl: 6   methocarbamol (ROBAXIN) 500 MG tablet, Take 1 tablet (500 mg total) by mouth 3 (three) times daily., Disp: 90 tablet, Rfl: 0   metoCLOPramide (REGLAN) 10 MG tablet, Take 1 tablet (10 mg total) by mouth 4 (four) times daily as needed for nausea., Disp: 120 tablet, Rfl: 0   metoprolol succinate (TOPROL-XL) 25 MG 24 hr tablet, Take 1 tablet (25 mg total) by mouth daily., Disp: 90 tablet, Rfl: 3   Multiple Vitamin tablet, Take 1 tablet by mouth daily., Disp: , Rfl:    pantoprazole (PROTONIX) 40 MG tablet, Take 1 tablet (40 mg total) by mouth 2 (two) times daily. *Please call [phone] to schedule an office visit for more refills.*, Disp: 180 tablet, Rfl: 0   pregabalin (LYRICA) 50 MG capsule, Take 1 capsule by mouth at bedtime for one week then take 1 capsule twice daily for one week then take 1 capsule three times daily., Disp: 90 capsule, Rfl: 3   Suvorexant (BELSOMRA) 10 MG TABS, Take 1 tablet (10  mg total) by mouth at bedtime., Disp: 30 tablet, Rfl: 5   Vilazodone HCl (VIIBRYD) 40 MG TABS, Take 1 tablet (40 mg total) by mouth daily., Disp: 90 tablet, Rfl: 0   VITAMIN D, CHOLECALCIFEROL, PO, Take 5,000 Int'l Units by mouth daily., Disp: , Rfl:    clonazePAM (KLONOPIN) 0.5 MG tablet, Take 1/2 - 1 tablet (0.25-0.5 mg total) by mouth 2 (two) times daily as needed for anxiety., Disp: 30 tablet, Rfl: 0  Allergies:  Allergies  Allergen Reactions   Crab [Shellfish Allergy] Hives and Itching    Allergist advises against eating crab.   Zoloft [Sertraline Hcl] Other (See Comments)    Hair  loss   Other Hives, Itching and Rash    Dermabond, steri strips   Sulfa Antibiotics Hives    Full body hives.    Past Medical History, Surgical history, Social history, and Family History were reviewed and updated.  Review of Systems: Review of Systems  Constitutional:  Positive for fatigue.  HENT:  Negative.    Eyes: Negative.   Respiratory: Negative.  Shortness of breath: mild with exertion.   Cardiovascular: Negative.   Gastrointestinal: Negative.   Endocrine: Negative.   Genitourinary: Negative.    Musculoskeletal: Negative.   Skin: Negative.   Neurological: Negative.   Hematological: Negative.      Physical Exam:  height is 5\' 5"  (1.651 m) and weight is 228 lb 12.8 oz (103.8 kg). Her oral temperature is 97.9 F (36.6 C). Her blood pressure is 121/74 and her pulse is 75. Her respiration is 18 and oxygen saturation is 100%.   Physical Exam General: NAD Cardiovascular: regular rate and rhythm Pulmonary: clear ant fields Abdomen: soft, nontender, + bowel sounds GU: no suprapubic tenderness Extremities: no edema, no joint deformities Skin: no rashes Neurological: Weakness but otherwise nonfocal   Lab Results  Component Value Date   WBC 10.3 06/15/2023   HGB 13.7 06/15/2023   HCT 40.8 06/15/2023   MCV 94.9 06/15/2023   PLT 366 06/15/2023     Chemistry      Component Value Date/Time   NA 137 04/15/2023 1431   NA 137 03/02/2023 1053   K 3.8 04/15/2023 1431   CL 103 04/15/2023 1431   CO2 24 04/15/2023 1431   BUN 11 04/15/2023 1431   BUN 11 03/02/2023 1053   CREATININE 0.76 04/15/2023 1431   CREATININE 0.75 12/08/2021 1114      Component Value Date/Time   CALCIUM 9.2 04/15/2023 1431   ALKPHOS 143 (H) 04/15/2023 1431   AST 31 04/15/2023 1431   ALT 54 (H) 04/15/2023 1431   BILITOT 0.3 04/15/2023 1431     No diagnosis found.  Assessment and Plan- Patient is a 43 y.o. female with iron deficiency and reactive thrombocytosis. She was treated with IV iron  which she tolerated well with mild fatigue.   Thrombocytosis has resolved secondary to IV iron infusions. WBC looks normal. Hgb normal She will continue working with GI on her IBS-C and gastritis  Disposition: RTC 3 months APP, labs (CBC w/, retic, iron, ferritin)   Clent Jacks PA-C 3/19/20251:51 PM

## 2023-06-16 ENCOUNTER — Encounter (HOSPITAL_BASED_OUTPATIENT_CLINIC_OR_DEPARTMENT_OTHER): Payer: Self-pay

## 2023-06-16 ENCOUNTER — Ambulatory Visit (HOSPITAL_BASED_OUTPATIENT_CLINIC_OR_DEPARTMENT_OTHER)
Admission: RE | Admit: 2023-06-16 | Discharge: 2023-06-16 | Disposition: A | Discharge status: Home / Self Care | Source: Ambulatory Visit | Attending: Medical-Surgical | Admitting: Medical-Surgical

## 2023-06-16 DIAGNOSIS — Z1231 Encounter for screening mammogram for malignant neoplasm of breast: Secondary | ICD-10-CM | POA: Diagnosis not present

## 2023-06-16 LAB — IRON AND IRON BINDING CAPACITY (CC-WL,HP ONLY)
Iron: 70 ug/dL (ref 28–170)
Saturation Ratios: 22 % (ref 10.4–31.8)
TIBC: 322 ug/dL (ref 250–450)
UIBC: 252 ug/dL (ref 148–442)

## 2023-06-20 ENCOUNTER — Ambulatory Visit (INDEPENDENT_AMBULATORY_CARE_PROVIDER_SITE_OTHER): Admitting: Licensed Clinical Social Worker

## 2023-06-20 ENCOUNTER — Encounter: Payer: Self-pay | Admitting: Medical-Surgical

## 2023-06-20 DIAGNOSIS — F331 Major depressive disorder, recurrent, moderate: Secondary | ICD-10-CM | POA: Diagnosis not present

## 2023-06-20 DIAGNOSIS — F411 Generalized anxiety disorder: Secondary | ICD-10-CM | POA: Diagnosis not present
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
## 2023-07-06 ENCOUNTER — Encounter: Payer: Self-pay | Admitting: Medical-Surgical

## 2023-07-06 ENCOUNTER — Other Ambulatory Visit (HOSPITAL_BASED_OUTPATIENT_CLINIC_OR_DEPARTMENT_OTHER): Payer: Self-pay

## 2023-07-06 ENCOUNTER — Ambulatory Visit: Admitting: Medical-Surgical

## 2023-07-06 VITALS — BP 110/76 | HR 73 | Resp 20 | Ht 65.0 in | Wt 227.2 lb

## 2023-07-06 DIAGNOSIS — F32A Depression, unspecified: Secondary | ICD-10-CM | POA: Diagnosis not present

## 2023-07-06 DIAGNOSIS — F419 Anxiety disorder, unspecified: Secondary | ICD-10-CM

## 2023-07-06 MED ORDER — METOPROLOL SUCCINATE ER 50 MG PO TB24
50.0000 mg | ORAL_TABLET | Freq: Every day | ORAL | 1 refills | Status: DC
  Filled 2023-07-06: qty 90, 90d supply, fill #0
  Filled 2023-10-07: qty 90, 90d supply, fill #1

## 2023-07-06 NOTE — Progress Notes (Signed)
 Established patient visit  History, exam, impression, and plan:  1. Anxiety and depression (Primary) Pleasant 43 year old female presenting today for follow-up on anxiety and depression.  Approximately 4 weeks ago, we made the choice to start tapering down off Wellbutrin.  She was on 450 mg daily and has been able to taper down slowly and is now completely off the medication.  She notes that she is a little more fidgety and restless than she previously was.  Feels that anxiety is the worst although she does have significant issues with depression.  Currently taking Viibryd 40 mg daily and Vraylar 3 mg daily.  Also on Toprol XL 25 mg daily which was initiated to help with tachycardia, blood pressure, and anxiety.  Denies SI/HI.  At this point, we are currently on maximum dose of Viibryd as well as maximum recommended on Vraylar.  We could increase Vraylar to 4.5 mg daily if desired but unsure if this will provide adequate effect.  As her main complaint is anxiety, discussed options including addition of BuSpar or use of a different agent such as gabapentin.  She is already on Lyrica so this is contraindicated.  She is on Toprol-XL so we do not need to consider propranolol.  After review of options, plan to increase Toprol-XL to 50 mg daily to see if this is beneficial with anxiety.  Advised her to monitor her blood pressure and heart rate on the higher dose and let me know if this is not tolerated.  She will reach out in 2 weeks via MyChart message to let me know how the new dose is going and if it is helping her anxiety at all.     07/06/2023   11:07 AM 06/20/2023    8:34 AM 06/08/2023   11:24 AM 04/15/2023    3:30 PM 03/02/2023   10:45 AM  Depression screen PHQ 2/9  Decreased Interest 2  2 3 2   Down, Depressed, Hopeless 2  1 1 2   PHQ - 2 Score 4  3 4 4   Altered sleeping 2  2 1 2   Tired, decreased energy 3  3 3 3   Change in appetite 3  1 1 3   Feeling bad or failure about yourself  2  1 0 2   Trouble concentrating 1  1 1 1   Moving slowly or fidgety/restless 2  2 0 0  Suicidal thoughts 0  0 0 0  PHQ-9 Score 17  13 10 15   Difficult doing work/chores Very difficult  Very difficult Very difficult Very difficult     Information is confidential and restricted. Go to Review Flowsheets to unlock data.      07/06/2023   11:07 AM 06/08/2023   11:25 AM 03/02/2023   10:46 AM 11/25/2022    9:49 AM  GAD 7 : Generalized Anxiety Score  Nervous, Anxious, on Edge 2 2 2 1   Control/stop worrying 2 2 1 1   Worry too much - different things 2 2 2 1   Trouble relaxing 2 2 1  0  Restless 2 1 2  0  Easily annoyed or irritable 1 2 2 1   Afraid - awful might happen 1 2 1  0  Total GAD 7 Score 12 13 11 4   Anxiety Difficulty Very difficult Very difficult Somewhat difficult Not difficult at all   Procedures performed this visit: None.  Return for MyChart message follow-up in 2 weeks as discussed.  __________________________________ Thayer Ohm, DNP, APRN, FNP-BC Primary Care  and Sports Medicine [facility] [place]

## 2023-07-07 ENCOUNTER — Encounter: Payer: Self-pay | Admitting: Medical-Surgical

## 2023-07-12 ENCOUNTER — Ambulatory Visit (HOSPITAL_COMMUNITY): Admitting: Licensed Clinical Social Worker

## 2023-07-12 DIAGNOSIS — H5213 Myopia, bilateral: Secondary | ICD-10-CM | POA: Diagnosis not present

## 2023-07-19 ENCOUNTER — Ambulatory Visit (HOSPITAL_COMMUNITY): Admitting: Licensed Clinical Social Worker

## 2023-07-21 ENCOUNTER — Other Ambulatory Visit: Payer: Self-pay

## 2023-07-21 ENCOUNTER — Other Ambulatory Visit (HOSPITAL_BASED_OUTPATIENT_CLINIC_OR_DEPARTMENT_OTHER): Payer: Self-pay

## 2023-07-21 ENCOUNTER — Encounter: Payer: Self-pay | Admitting: Medical-Surgical

## 2023-07-24 ENCOUNTER — Other Ambulatory Visit: Payer: Self-pay | Admitting: Medical-Surgical

## 2023-07-25 ENCOUNTER — Ambulatory Visit (HOSPITAL_COMMUNITY): Admitting: Licensed Clinical Social Worker

## 2023-07-25 ENCOUNTER — Other Ambulatory Visit (HOSPITAL_BASED_OUTPATIENT_CLINIC_OR_DEPARTMENT_OTHER): Payer: Self-pay

## 2023-07-25 MED ORDER — VILAZODONE HCL 40 MG PO TABS
40.0000 mg | ORAL_TABLET | Freq: Every day | ORAL | 0 refills | Status: DC
  Filled 2023-07-25: qty 90, 90d supply, fill #0

## 2023-08-02 ENCOUNTER — Encounter (HOSPITAL_COMMUNITY): Payer: Self-pay

## 2023-08-02 ENCOUNTER — Ambulatory Visit (INDEPENDENT_AMBULATORY_CARE_PROVIDER_SITE_OTHER): Admitting: Licensed Clinical Social Worker

## 2023-08-02 DIAGNOSIS — F331 Major depressive disorder, recurrent, moderate: Secondary | ICD-10-CM | POA: Diagnosis not present

## 2023-08-02 DIAGNOSIS — F411 Generalized anxiety disorder: Secondary | ICD-10-CM | POA: Diagnosis not present

## 2023-08-02 NOTE — Progress Notes (Addendum)
 THERAPIST PROGRESS NOTE  Session Time: 9:00 AM to 9:45 AM  Participation Level: Active  Behavioral Response: CasualAlertAnxious and Depressed  Type of Therapy: Individual Therapy  Treatment Goals addressed: Decrease in anxiety decrease in avoidance wants to work on overthinking better at managing task organizational skills decrease depression  ProgressTowards Goals: Progressing-worked on Equities trader started to work on psychoeducation for anxiety  Interventions: CBT, Solution Focused, Strength-based, Supportive, and Other: Coping  Summary: Tracey Patterson is a 43 y.o. female who presents with tired in session not having problems every night twice a week wake up and can't sleep.  Taken off sleeping medication because sleeping too much relates Monday slept a lot but that was because she was exhausted from work working from Friday to Sunday. Anxiety is the biggest thing she thinks for sleep problems. May wake up husband making so much noise and other times can't sleep.  Therapist brainstormed with patient about how to address sleep issues therapist mention anxiety issues and patient thinks that is a big part. Mind keeps rolling over everything normally worries household stuff and bills, wake up and have something she is  worried about. House a mess not real progress. Three goals did for week and then "fell off the wagon". Try to focus on little things. Clean the house not a good goal break it down  more.  Stressed at work just started doing CT new at it. Pushing her to do CT more stressful than x-rays. Make sure the order is right contrast or not contrast are they allergic to contrast. Thoughts go through like what if allergic reaction or if with contrast or not contrast made a few mistakes. If mess up the exam an re-do super stressful. Not a simple x-ray where you don't get perfect picture and repeat not the same more involved. Stressful on her doesn't like CT because of the stress.   Eventually get comfortable. 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 Asked what does with time off lazy tired wants to sleep and TV. New job Monday thru Friday forces her to be more productive human being. Shorter days.  Worked on Research officer, trade union environment one household task day. Set three goals the night before. Thought dump talked about that as helpful.  Meditation thinks can help noted different ways it can help.  Does get panic a couple times over the last couple of years where break down. Step away from what doing calm down.  Patient recognizes symptoms for fight/flight. Review session said we are working on stop avoiding and have a plan to start doing this. Explore relaxation next session.   Completed treatment plan in session.  Looked at worksheet therapist prepared about managing symptoms for ADD therapist thinks a lot of the issues patient has related to ADD. We looked at suggestions and figured once patient could apply she does the to do list.  Reviewed whether she set goals the night before therapist thinks this is effective patient said it was but stopped after a week she will restart that.  Talked about creating a thought dump different ways could be used therapist showed patient her notebook where she puts things in and helps her in that way could do before sleep help to get thoughts out of her head.  Talked about decluttering your environment initially for patient utilizing break down large task patient will do 1 household task a day.  Talked about engaging in activities that promote focus that include exercise,  meditation or spending time in nature.  Therapist noted this is very helpful for people with ADHD to help calm them down to help them focus also helpful for other symptoms like anxiety.  Will look at deep breathing therapist explored what she likes to do patient talked about walking noted this needs to be part of an anxiety plan helps the amygdala and  not be so activated so quickly over time, using mindfulness helps develop emotional regulation skills.  Other suggestions practice time management learn about ADHD lifestyle adjustments including getting enough sleep exercise regularly eat a balanced diet.  Patient has sleep issues problem solved around that possibly if she has a regular job that we will give her structure routine that can help as well with a sleep routine.  Looked at worksheet about anxiety noting core of anxiety is fight/flight again one of the first things to work on is deactivating the fight/flight realizing it is a false alarm we think something bad can I happen but often inaccurate.  We looked at how it affects bodily symptoms getting is ready to fight her fully but not needed sometimes just knowing it is a false alarm helpful.  Talked about avoidance one of the things were working on her treatment plan not helpful.  We never learned anxiety is not as bad we never learn skills to cope, we do not get used to a situation to help deactivate anxiety Suicidal/Homicidal: No  Plan: Return again in 2 weeks.2.  Look at relaxation strategies for patient 3.  Continue to look at CBT for anxiety. 3.  Patient practice organizational strategies including setting 3 goals the night before working on doing 1 task a day for the household  Diagnosis: Generalized anxiety disorder major depressive disorder, recurrent, moderate  Collaboration of Care: Other none needed  Patient/Guardian was advised Release of Information must be obtained prior to any record release in order to collaborate their care with an outside provider. Patient/Guardian was advised if they have not already done so to contact the registration department to sign all necessary forms in order for us  to release information regarding their care.   Consent: Patient/Guardian gives verbal consent for treatment and assignment of benefits for services provided during this visit.  Patient/Guardian expressed understanding and agreed to proceed.   Dallie Duel, LCSW 08/02/2023

## 2023-08-16 ENCOUNTER — Ambulatory Visit (HOSPITAL_COMMUNITY): Admitting: Licensed Clinical Social Worker

## 2023-08-26 ENCOUNTER — Other Ambulatory Visit (HOSPITAL_BASED_OUTPATIENT_CLINIC_OR_DEPARTMENT_OTHER): Payer: Self-pay

## 2023-08-26 ENCOUNTER — Other Ambulatory Visit: Payer: Self-pay | Admitting: Medical-Surgical

## 2023-08-26 DIAGNOSIS — F411 Generalized anxiety disorder: Secondary | ICD-10-CM

## 2023-08-26 DIAGNOSIS — F339 Major depressive disorder, recurrent, unspecified: Secondary | ICD-10-CM

## 2023-08-26 MED ORDER — CARIPRAZINE HCL 3 MG PO CAPS
3.0000 mg | ORAL_CAPSULE | Freq: Every day | ORAL | 1 refills | Status: DC
  Filled 2023-08-26: qty 90, 90d supply, fill #0
  Filled 2023-11-07: qty 90, 90d supply, fill #1

## 2023-08-30 ENCOUNTER — Ambulatory Visit (INDEPENDENT_AMBULATORY_CARE_PROVIDER_SITE_OTHER): Payer: Self-pay | Admitting: Licensed Clinical Social Worker

## 2023-08-30 DIAGNOSIS — F331 Major depressive disorder, recurrent, moderate: Secondary | ICD-10-CM

## 2023-08-30 DIAGNOSIS — F411 Generalized anxiety disorder: Secondary | ICD-10-CM

## 2023-08-30 NOTE — Progress Notes (Signed)
 Patient did not show for appointment.

## 2023-09-15 ENCOUNTER — Inpatient Hospital Stay: Attending: Hematology & Oncology

## 2023-09-15 ENCOUNTER — Inpatient Hospital Stay: Admitting: Medical Oncology

## 2023-09-15 VITALS — BP 117/66 | HR 69 | Temp 98.1°F | Resp 18 | Ht 65.0 in | Wt 228.1 lb

## 2023-09-15 DIAGNOSIS — D75839 Thrombocytosis, unspecified: Secondary | ICD-10-CM

## 2023-09-15 DIAGNOSIS — E611 Iron deficiency: Secondary | ICD-10-CM | POA: Insufficient documentation

## 2023-09-15 DIAGNOSIS — Z79899 Other long term (current) drug therapy: Secondary | ICD-10-CM | POA: Diagnosis not present

## 2023-09-15 DIAGNOSIS — D509 Iron deficiency anemia, unspecified: Secondary | ICD-10-CM | POA: Diagnosis not present

## 2023-09-15 DIAGNOSIS — Z862 Personal history of diseases of the blood and blood-forming organs and certain disorders involving the immune mechanism: Secondary | ICD-10-CM | POA: Insufficient documentation

## 2023-09-15 LAB — CBC WITH DIFFERENTIAL (CANCER CENTER ONLY)
Abs Immature Granulocytes: 0.11 10*3/uL — ABNORMAL HIGH (ref 0.00–0.07)
Basophils Absolute: 0.1 10*3/uL (ref 0.0–0.1)
Basophils Relative: 1 %
Eosinophils Absolute: 0.2 10*3/uL (ref 0.0–0.5)
Eosinophils Relative: 2 %
HCT: 40.3 % (ref 36.0–46.0)
Hemoglobin: 13.7 g/dL (ref 12.0–15.0)
Immature Granulocytes: 1 %
Lymphocytes Relative: 33 %
Lymphs Abs: 3.3 10*3/uL (ref 0.7–4.0)
MCH: 31.6 pg (ref 26.0–34.0)
MCHC: 34 g/dL (ref 30.0–36.0)
MCV: 92.9 fL (ref 80.0–100.0)
Monocytes Absolute: 0.7 10*3/uL (ref 0.1–1.0)
Monocytes Relative: 7 %
Neutro Abs: 5.5 10*3/uL (ref 1.7–7.7)
Neutrophils Relative %: 56 %
Platelet Count: 409 10*3/uL — ABNORMAL HIGH (ref 150–400)
RBC: 4.34 MIL/uL (ref 3.87–5.11)
RDW: 12.8 % (ref 11.5–15.5)
WBC Count: 9.8 10*3/uL (ref 4.0–10.5)
nRBC: 0 % (ref 0.0–0.2)

## 2023-09-15 LAB — RETIC PANEL
Immature Retic Fract: 8.2 % (ref 2.3–15.9)
RBC.: 4.32 MIL/uL (ref 3.87–5.11)
Retic Count, Absolute: 99.8 10*3/uL (ref 19.0–186.0)
Retic Ct Pct: 2.3 % (ref 0.4–3.1)
Reticulocyte Hemoglobin: 35.1 pg (ref >27.9)

## 2023-09-15 LAB — FERRITIN: Ferritin: 273 ng/mL (ref 11–307)

## 2023-09-15 NOTE — Progress Notes (Signed)
 Hematology and Oncology Follow Up Visit  Tracey Patterson [id number] [birth year] 43 y.o. 09/15/2023  Past Medical History:  Diagnosis Date   Anxiety    Arthritis    Asthma    Chronic headache    COVID-19 11/2020   Depression    Fibromyalgia    GERD (gastroesophageal reflux disease)    History of TMJ disorder    IBS (irritable bowel syndrome)    Obesity    Pneumonia     Principle Diagnosis:  Low iron  secondary to malabsorption (IBS-C), gastritis Reactive thrombocytosis  Former work up Colonoscopy- 10/2019- no significant findings  Endoscopy- 12/2021- gastritis  Current Therapy:   IV Iron - Venofer  300 mg- last infusion series was (04/22/2023-05/09/2023)     Interim History:  Tracey Patterson is back for follow-up for thrombocytopenia and low iron  levels.     Today she states that she is doing well. Fatigue is still present.   She tolerated the IV infusions well. Mild fatigue the next day but otherwise no negative side effects. Salt cravings and most of her SOB improved.   Menstrual cycles are average in nature. 25-26 days in interval length. She will have 2-3 days of heavy cycle with 2-3 days of light menstruation. IUD removed last summer.  There has been no bleeding to her knowledge: denies epistaxis, gingivitis, hemoptysis, hematemesis, hematuria, melena, excessive bruising, blood donation.   No fever, chills, n/v, cough, rash, chest pain, abdominal pain or changes in bowel or bladder habits.  No swelling or tenderness in her extremities.  No falls or syncope.  Appetite and hydration are improved  Wt Readings from Last 3 Encounters:  09/15/23 228 lb 1.9 oz (103.5 kg)  07/06/23 227 lb 3.2 oz (103.1 kg)  06/15/23 228 lb 12.8 oz (103.8 kg)     Medications:   Current Outpatient Medications:    albuterol  (VENTOLIN  HFA) 108 (90 Base) MCG/ACT inhaler, Inhale 2 puffs into the lungs every 6 (six) hours as needed for wheezing or shortness of breath., Disp: 6.7 g, Rfl: 0    cariprazine  (VRAYLAR ) 3 MG capsule, Take 1 capsule (3 mg total) by mouth daily., Disp: 90 capsule, Rfl: 1   clonazePAM  (KLONOPIN ) 0.5 MG tablet, Take 1/2 - 1 tablet (0.25-0.5 mg total) by mouth 2 (two) times daily as needed for anxiety., Disp: 30 tablet, Rfl: 0   dicyclomine  (BENTYL ) 20 MG tablet, Take 1 tablet (20 mg total) by mouth 3 (three) times daily as needed for spasms., Disp: 50 tablet, Rfl: 0   fluticasone  (FLONASE ) 50 MCG/ACT nasal spray, Place 2 sprays into both nostrils daily., Disp: 16 g, Rfl: 6   methocarbamol  (ROBAXIN ) 500 MG tablet, Take 1 tablet (500 mg total) by mouth 3 (three) times daily., Disp: 90 tablet, Rfl: 0   metoCLOPramide  (REGLAN ) 10 MG tablet, Take 1 tablet (10 mg total) by mouth 4 (four) times daily as needed for nausea., Disp: 120 tablet, Rfl: 0   metoprolol  succinate (TOPROL -XL) 50 MG 24 hr tablet, Take 1 tablet (50 mg total) by mouth daily., Disp: 90 tablet, Rfl: 1   Multiple Vitamin tablet, Take 1 tablet by mouth daily., Disp: , Rfl:    pantoprazole  (PROTONIX ) 40 MG tablet, Take 1 tablet (40 mg total) by mouth 2 (two) times daily. *Please call [phone] to schedule an office visit for more refills.*, Disp: 180 tablet, Rfl: 0   pregabalin  (LYRICA ) 50 MG capsule, Take 1 capsule by mouth at bedtime for one week then take 1 capsule twice daily for one week  then take 1 capsule three times daily., Disp: 90 capsule, Rfl: 3   Vilazodone  HCl (VIIBRYD ) 40 MG TABS, Take 1 tablet (40 mg total) by mouth daily., Disp: 90 tablet, Rfl: 0   VITAMIN D , CHOLECALCIFEROL, PO, Take 5,000 Int'l Units by mouth daily., Disp: , Rfl:   Allergies:  Allergies  Allergen Reactions   Crab [Shellfish Allergy] Hives and Itching    Allergist advises against eating crab.   Zoloft [Sertraline Hcl] Other (See Comments)    Hair loss   Other Hives, Itching and Rash    Dermabond, steri strips   Sulfa Antibiotics Hives    Full body hives.    Past Medical History, Surgical history, Social  history, and Family History were reviewed and updated.  Review of Systems: Review of Systems  Constitutional:  Positive for fatigue.  HENT:  Negative.    Eyes: Negative.   Respiratory: Negative.  Shortness of breath: mild with exertion.   Cardiovascular: Negative.   Gastrointestinal: Negative.   Endocrine: Negative.   Genitourinary: Negative.    Musculoskeletal: Negative.   Skin: Negative.   Neurological: Negative.   Hematological: Negative.      Physical Exam:  height is 5' 5 (1.651 m) and weight is 228 lb 1.9 oz (103.5 kg). Her oral temperature is 98.1 F (36.7 C). Her blood pressure is 117/66 and her pulse is 69. Her respiration is 18 and oxygen saturation is 98%.   Physical Exam General: NAD Cardiovascular: regular rate and rhythm Pulmonary: clear ant fields Abdomen: soft, nontender, + bowel sounds GU: no suprapubic tenderness Extremities: no edema, no joint deformities Skin: no rashes Neurological: Weakness but otherwise nonfocal   Lab Results  Component Value Date   WBC 9.8 09/15/2023   HGB 13.7 09/15/2023   HCT 40.3 09/15/2023   MCV 92.9 09/15/2023   PLT 409 (H) 09/15/2023     Chemistry      Component Value Date/Time   NA 137 04/15/2023 1431   NA 137 03/02/2023 1053   K 3.8 04/15/2023 1431   CL 103 04/15/2023 1431   CO2 24 04/15/2023 1431   BUN 11 04/15/2023 1431   BUN 11 03/02/2023 1053   CREATININE 0.76 04/15/2023 1431   CREATININE 0.75 12/08/2021 1114      Component Value Date/Time   CALCIUM 9.2 04/15/2023 1431   ALKPHOS 143 (H) 04/15/2023 1431   AST 31 04/15/2023 1431   ALT 54 (H) 04/15/2023 1431   BILITOT 0.3 04/15/2023 1431     Encounter Diagnoses  Name Primary?   Thrombocytosis Yes   Low iron     Iron  deficiency anemia, unspecified iron  deficiency anemia type     Assessment and Plan- Patient is a 43 y.o. female with iron  deficiency and reactive thrombocytosis. She was treated with IV iron  which she tolerated well with mild fatigue.    Thrombocytosis resolved secondary to IV iron  infusions.  Hgb today is 13.7 which is stable however her thrombocytosis has returned as well as an elevated retic hemoglobin level. Likely will need IV iron   Iron  studies pending She will continue working with GI on her IBS-C and gastritis  Disposition: RTC 3 months APP, labs (CBC w/, retic, iron , ferritin)   Sunnie England PA-C 6/19/20252:59 PM

## 2023-09-16 LAB — IRON AND IRON BINDING CAPACITY (CC-WL,HP ONLY)
Iron: 91 ug/dL (ref 28–170)
Saturation Ratios: 28 % (ref 10.4–31.8)
TIBC: 329 ug/dL (ref 250–450)
UIBC: 238 ug/dL (ref 148–442)

## 2023-09-19 ENCOUNTER — Ambulatory Visit: Payer: Self-pay | Admitting: Medical Oncology

## 2023-10-27 ENCOUNTER — Other Ambulatory Visit: Payer: Self-pay | Admitting: Medical-Surgical

## 2023-10-27 ENCOUNTER — Other Ambulatory Visit: Payer: Self-pay | Admitting: Gastroenterology

## 2023-10-28 ENCOUNTER — Other Ambulatory Visit (HOSPITAL_BASED_OUTPATIENT_CLINIC_OR_DEPARTMENT_OTHER): Payer: Self-pay

## 2023-10-28 ENCOUNTER — Other Ambulatory Visit: Payer: Self-pay

## 2023-10-28 MED ORDER — VILAZODONE HCL 40 MG PO TABS
40.0000 mg | ORAL_TABLET | Freq: Every day | ORAL | 0 refills | Status: DC
  Filled 2023-10-28: qty 90, 90d supply, fill #0

## 2023-10-31 ENCOUNTER — Other Ambulatory Visit (HOSPITAL_BASED_OUTPATIENT_CLINIC_OR_DEPARTMENT_OTHER): Payer: Self-pay

## 2023-11-01 ENCOUNTER — Other Ambulatory Visit (HOSPITAL_BASED_OUTPATIENT_CLINIC_OR_DEPARTMENT_OTHER): Payer: Self-pay

## 2023-11-01 ENCOUNTER — Other Ambulatory Visit: Payer: Self-pay | Admitting: Gastroenterology

## 2023-11-07 ENCOUNTER — Other Ambulatory Visit (HOSPITAL_BASED_OUTPATIENT_CLINIC_OR_DEPARTMENT_OTHER): Payer: Self-pay

## 2023-11-07 ENCOUNTER — Other Ambulatory Visit: Payer: Self-pay | Admitting: Gastroenterology

## 2023-11-08 ENCOUNTER — Other Ambulatory Visit (HOSPITAL_BASED_OUTPATIENT_CLINIC_OR_DEPARTMENT_OTHER): Payer: Self-pay

## 2023-11-08 ENCOUNTER — Other Ambulatory Visit: Payer: Self-pay

## 2023-11-11 ENCOUNTER — Other Ambulatory Visit (HOSPITAL_BASED_OUTPATIENT_CLINIC_OR_DEPARTMENT_OTHER): Payer: Self-pay

## 2023-11-11 ENCOUNTER — Telehealth: Payer: Self-pay | Admitting: Physician Assistant

## 2023-11-11 MED ORDER — PANTOPRAZOLE SODIUM 40 MG PO TBEC
40.0000 mg | DELAYED_RELEASE_TABLET | Freq: Two times a day (BID) | ORAL | 0 refills | Status: DC
Start: 2023-11-11 — End: 2024-02-14
  Filled 2023-11-11: qty 180, 90d supply, fill #0

## 2023-11-11 NOTE — Telephone Encounter (Signed)
 Inbound call from patient requesting a refill for pantoprazole. Patient has been scheduled for 10/6. Please advise, thank you

## 2023-11-11 NOTE — Telephone Encounter (Signed)
 Refill sent to pharmacy.

## 2023-11-29 ENCOUNTER — Encounter: Payer: Self-pay | Admitting: Sports Medicine

## 2023-12-08 ENCOUNTER — Encounter: Payer: Self-pay | Admitting: Family

## 2023-12-15 ENCOUNTER — Ambulatory Visit: Admitting: Medical Oncology

## 2023-12-15 ENCOUNTER — Inpatient Hospital Stay: Payer: Self-pay

## 2024-01-02 ENCOUNTER — Ambulatory Visit: Admitting: Gastroenterology

## 2024-01-02 ENCOUNTER — Encounter: Payer: Self-pay | Admitting: Family

## 2024-01-06 ENCOUNTER — Other Ambulatory Visit (HOSPITAL_BASED_OUTPATIENT_CLINIC_OR_DEPARTMENT_OTHER): Payer: Self-pay

## 2024-01-06 ENCOUNTER — Encounter: Payer: Self-pay | Admitting: Family

## 2024-01-06 ENCOUNTER — Other Ambulatory Visit: Payer: Self-pay | Admitting: Medical-Surgical

## 2024-01-06 MED ORDER — METOPROLOL SUCCINATE ER 50 MG PO TB24
50.0000 mg | ORAL_TABLET | Freq: Every day | ORAL | 0 refills | Status: DC
  Filled 2024-01-06: qty 30, 30d supply, fill #0

## 2024-01-18 ENCOUNTER — Encounter: Payer: Self-pay | Admitting: Medical-Surgical

## 2024-01-20 ENCOUNTER — Other Ambulatory Visit: Payer: Self-pay | Admitting: Medical-Surgical

## 2024-01-23 ENCOUNTER — Other Ambulatory Visit (HOSPITAL_BASED_OUTPATIENT_CLINIC_OR_DEPARTMENT_OTHER): Payer: Self-pay

## 2024-01-23 MED ORDER — VILAZODONE HCL 40 MG PO TABS
40.0000 mg | ORAL_TABLET | Freq: Every day | ORAL | 0 refills | Status: DC
  Filled 2024-01-23: qty 30, 30d supply, fill #0

## 2024-02-13 ENCOUNTER — Other Ambulatory Visit: Payer: Self-pay | Admitting: Medical-Surgical

## 2024-02-14 ENCOUNTER — Other Ambulatory Visit (HOSPITAL_BASED_OUTPATIENT_CLINIC_OR_DEPARTMENT_OTHER): Payer: Self-pay

## 2024-02-14 ENCOUNTER — Encounter: Payer: Self-pay | Admitting: Gastroenterology

## 2024-02-14 ENCOUNTER — Inpatient Hospital Stay: Payer: Self-pay | Admitting: Medical Oncology

## 2024-02-14 ENCOUNTER — Ambulatory Visit: Admitting: Gastroenterology

## 2024-02-14 ENCOUNTER — Encounter: Payer: Self-pay | Admitting: Medical Oncology

## 2024-02-14 ENCOUNTER — Inpatient Hospital Stay: Payer: Self-pay | Attending: Hematology & Oncology

## 2024-02-14 ENCOUNTER — Ambulatory Visit (INDEPENDENT_AMBULATORY_CARE_PROVIDER_SITE_OTHER): Payer: Self-pay | Admitting: Medical-Surgical

## 2024-02-14 ENCOUNTER — Encounter: Payer: Self-pay | Admitting: Medical-Surgical

## 2024-02-14 ENCOUNTER — Encounter: Payer: Self-pay | Admitting: Family

## 2024-02-14 ENCOUNTER — Other Ambulatory Visit: Payer: Self-pay

## 2024-02-14 VITALS — BP 118/72 | HR 107 | Ht 66.0 in | Wt 233.0 lb

## 2024-02-14 VITALS — BP 125/72 | HR 76 | Temp 97.9°F | Resp 18 | Ht 65.0 in | Wt 231.8 lb

## 2024-02-14 VITALS — BP 117/76 | HR 74 | Resp 20 | Ht 65.0 in | Wt 232.5 lb

## 2024-02-14 DIAGNOSIS — M5416 Radiculopathy, lumbar region: Secondary | ICD-10-CM

## 2024-02-14 DIAGNOSIS — D75839 Thrombocytosis, unspecified: Secondary | ICD-10-CM | POA: Insufficient documentation

## 2024-02-14 DIAGNOSIS — K582 Mixed irritable bowel syndrome: Secondary | ICD-10-CM | POA: Diagnosis not present

## 2024-02-14 DIAGNOSIS — E611 Iron deficiency: Secondary | ICD-10-CM

## 2024-02-14 DIAGNOSIS — K219 Gastro-esophageal reflux disease without esophagitis: Secondary | ICD-10-CM | POA: Diagnosis not present

## 2024-02-14 DIAGNOSIS — F419 Anxiety disorder, unspecified: Secondary | ICD-10-CM

## 2024-02-14 DIAGNOSIS — M797 Fibromyalgia: Secondary | ICD-10-CM

## 2024-02-14 DIAGNOSIS — Z79899 Other long term (current) drug therapy: Secondary | ICD-10-CM | POA: Diagnosis not present

## 2024-02-14 DIAGNOSIS — D509 Iron deficiency anemia, unspecified: Secondary | ICD-10-CM | POA: Diagnosis not present

## 2024-02-14 DIAGNOSIS — Z0000 Encounter for general adult medical examination without abnormal findings: Secondary | ICD-10-CM

## 2024-02-14 DIAGNOSIS — F32A Depression, unspecified: Secondary | ICD-10-CM

## 2024-02-14 LAB — CBC
HCT: 41.4 % (ref 36.0–46.0)
Hemoglobin: 14 g/dL (ref 12.0–15.0)
MCH: 31.6 pg (ref 26.0–34.0)
MCHC: 33.8 g/dL (ref 30.0–36.0)
MCV: 93.5 fL (ref 80.0–100.0)
Platelets: 366 K/uL (ref 150–400)
RBC: 4.43 MIL/uL (ref 3.87–5.11)
RDW: 13.1 % (ref 11.5–15.5)
WBC: 8.6 K/uL (ref 4.0–10.5)
nRBC: 0 % (ref 0.0–0.2)

## 2024-02-14 LAB — RETIC PANEL
Immature Retic Fract: 9.4 % (ref 2.3–15.9)
RBC.: 4.42 MIL/uL (ref 3.87–5.11)
Retic Count, Absolute: 102.1 K/uL (ref 19.0–186.0)
Retic Ct Pct: 2.3 % (ref 0.4–3.1)
Reticulocyte Hemoglobin: 35.4 pg

## 2024-02-14 LAB — IRON AND IRON BINDING CAPACITY (CC-WL,HP ONLY)
Iron: 105 ug/dL (ref 28–170)
Saturation Ratios: 32 % — ABNORMAL HIGH (ref 10.4–31.8)
TIBC: 329 ug/dL (ref 250–450)
UIBC: 224 ug/dL

## 2024-02-14 LAB — FERRITIN: Ferritin: 235 ng/mL (ref 11–307)

## 2024-02-14 MED ORDER — DICYCLOMINE HCL 20 MG PO TABS
20.0000 mg | ORAL_TABLET | Freq: Every day | ORAL | 0 refills | Status: AC | PRN
  Filled 2024-02-14: qty 50, 50d supply, fill #0

## 2024-02-14 MED ORDER — PANTOPRAZOLE SODIUM 40 MG PO TBEC
40.0000 mg | DELAYED_RELEASE_TABLET | Freq: Every day | ORAL | 3 refills | Status: AC
  Filled 2024-02-14: qty 90, 90d supply, fill #0

## 2024-02-14 MED ORDER — METHOCARBAMOL 500 MG PO TABS
500.0000 mg | ORAL_TABLET | Freq: Three times a day (TID) | ORAL | 3 refills | Status: AC
  Filled 2024-02-14: qty 90, 30d supply, fill #0

## 2024-02-14 MED ORDER — VRAYLAR 4.5 MG PO CAPS
4.5000 mg | ORAL_CAPSULE | Freq: Every day | ORAL | 1 refills | Status: DC
  Filled 2024-02-14: qty 30, 30d supply, fill #0
  Filled 2024-03-14: qty 30, 30d supply, fill #1

## 2024-02-14 MED ORDER — PREGABALIN 50 MG PO CAPS
ORAL_CAPSULE | ORAL | 3 refills | Status: AC
  Filled 2024-02-14: qty 90, 30d supply, fill #0

## 2024-02-14 MED ORDER — METOPROLOL SUCCINATE ER 50 MG PO TB24
50.0000 mg | ORAL_TABLET | Freq: Every day | ORAL | 3 refills | Status: AC
  Filled 2024-02-14: qty 90, 90d supply, fill #0

## 2024-02-14 MED ORDER — CLONAZEPAM 0.5 MG PO TABS
0.2500 mg | ORAL_TABLET | Freq: Two times a day (BID) | ORAL | 0 refills | Status: AC | PRN
  Filled 2024-02-14: qty 30, 15d supply, fill #0

## 2024-02-14 NOTE — Progress Notes (Addendum)
 Complete physical exam  Patient: Tracey Patterson   DOB: 02-12-1981   43 y.o. Female  MRN: [id number]  Subjective:    Chief Complaint  Patient presents with   Annual Exam    Tracey Patterson is a 43 y.o. female who presents today for a complete physical exam. She reports consuming a general diet. The patient does not participate in regular exercise at present. She generally feels fairly well. She reports sleeping well. She does not have additional problems to discuss today.    Most recent fall risk assessment:    02/14/2024   11:33 AM  Fall Risk   Falls in the past year? 0  Number falls in past yr: 0  Injury with Fall? 0  Risk for fall due to : No Fall Risks  Follow up Falls evaluation completed     Most recent depression screenings:    02/14/2024   11:33 AM 02/14/2024    9:13 AM  PHQ 2/9 Scores  PHQ - 2 Score 3 0  PHQ- 9 Score 12     Vision:Within last year Has not seen dentist within the last year  Patient Active Problem List   Diagnosis Date Noted   Iron  deficiency anemia 04/18/2023   Elevated blood pressure reading 12/09/2022   Plantar fasciitis, right 11/04/2022   Myalgia 12/08/2021   Sprain of calcaneofibular ligament of left ankle 08/18/2021   Irritable bowel syndrome 10/30/2020   Ventricular premature beats 10/30/2020   Constipation 07/04/2019   Fibromyalgia 11/03/2017   Degenerative tear of acetabular labrum of left hip 07/15/2016   Generalized anxiety disorder 02/17/2016   Left lumbar radiculitis 11/30/2013   Asthma, chronic 12/14/2012   GERD (gastroesophageal reflux disease) 12/14/2012   Seasonal allergies 12/14/2012   Anxiety and depression 12/14/2012   Insomnia 08/13/2012   Past Medical History:  Diagnosis Date   Allergy    Sulfa   Anxiety    Arthritis    Asthma    Chronic headache    COVID-19 11/2020   Depression    Fibromyalgia    GERD (gastroesophageal reflux disease)    History of TMJ disorder    IBS (irritable bowel syndrome)     Obesity    Pneumonia    Past Surgical History:  Procedure Laterality Date   BACK SURGERY  02/26/2014   KNEE ARTHROSCOPY  03/29/1996   left   LAPAROSCOPIC TUBAL LIGATION  02/28/2012   Procedure: LAPAROSCOPIC TUBAL LIGATION;  Surgeon: Tracey LITTIE Cobble, MD;  Location: [facility];  Service: Gynecology;  Laterality: Bilateral;   SPINE SURGERY  12/15   Microdiscectomy   TONSILLECTOMY     TUBAL LIGATION     Social History   Tobacco Use   Smoking status: Never   Smokeless tobacco: Never  Vaping Use   Vaping status: Never Used  Substance Use Topics   Alcohol use: Not Currently   Drug use: Never   Social History   Socioeconomic History   Marital status: Married    Spouse name: Not on file   Number of children: 3   Years of education: Not on file   Highest education level: Master's degree (e.g., MA, MS, MEng, MEd, MSW, MBA)  Occupational History   Not on file  Tobacco Use   Smoking status: Never   Smokeless tobacco: Never  Vaping Use   Vaping status: Never Used  Substance and Sexual Activity   Alcohol use: Not Currently   Drug use: Never   Sexual activity: Yes  Birth control/protection: Surgical  Other Topics Concern   Not on file  Social History Narrative   Marital Status: Married Probation Officer)    Children:  Son Aeronautical Engineer) Daughter Narvis)    Pets: Dogs (1) Cats (2)    Living Situation: Lives with  husband and children    Occupation: Radiology Tech - [employer]    Education: Bachelor's Degree in Science    Tobacco Use/Exposure:  None    Alcohol Use:  Occasional   Drug Use:  None   Diet:  Regular   Exercise:  None   Hobbies:  Reading          Social Drivers of Health   Financial Resource Strain: Medium Risk (02/13/2024)   Overall Financial Resource Strain (CARDIA)    Difficulty of Paying Living Expenses: Somewhat hard  Food Insecurity: No Food Insecurity (02/13/2024)   Hunger Vital Sign    Worried About Running Out of Food in the Last Year:  Never true    Ran Out of Food in the Last Year: Never true  Transportation Needs: No Transportation Needs (02/13/2024)   PRAPARE - Administrator, Civil Service (Medical): No    Lack of Transportation (Non-Medical): No  Physical Activity: Inactive (02/13/2024)   Exercise Vital Sign    Days of Exercise per Week: 0 days    Minutes of Exercise per Session: Not on file  Stress: Stress Concern Present (02/13/2024)   [employer] of Occupational Health - Occupational Stress Questionnaire    Feeling of Stress: Rather much  Social Connections: Moderately Integrated (02/13/2024)   Social Connection and Isolation Panel    Frequency of Communication with Friends and Family: Never    Frequency of Social Gatherings with Friends and Family: More than three times a week    Attends Religious Services: 1 to 4 times per year    Active Member of [employer] or Organizations: No    Attends Banker Meetings: Not on file    Marital Status: Married  Intimate Partner Violence: Not At Risk (04/15/2023)   Humiliation, Afraid, Rape, and Kick questionnaire    Fear of Current or Ex-Partner: No    Emotionally Abused: No    Physically Abused: No    Sexually Abused: No   Family Status  Relation Name Status   Mother Tracey Patterson Alive   Father Tracey Patterson Deceased   Sister  Deceased   Son Tracey Patterson Alive   Mat Aunt  (Not Specified)   Mat Aunt Tracey Patterson Alive   Mat Uncle  (Not Specified)   Mat Uncle  (Not Specified)   Mat Uncle  (Not Specified)   Mat Uncle [employer] Alive   Mat Uncle [employer] Alive   Mat Uncle [employer] Alive   MGM Tracey Patterson Alive   MGF Dasie Patterson Alive   PGM Tracey Patterson Alive   PGF Tracey Patterson Alive   Neg Hx  (Not Specified)  No partnership data on file   Family History  Problem Relation Age of Onset   Hypertension Mother    Depression Mother    Hypertension Father    ADD / ADHD Son    Anxiety disorder Son     Breast cancer Maternal Aunt    Cancer Maternal Aunt    Prostate cancer Maternal Uncle    Prostate cancer Maternal Uncle    Colon cancer Maternal Uncle    Cancer Maternal Uncle    Cancer Maternal Uncle  Cancer Maternal Uncle    Hypertension Maternal Grandmother    Hearing loss Maternal Grandfather    Cancer Paternal Grandmother    Heart disease Paternal Grandfather    Hypertension Paternal Grandfather    Esophageal cancer Neg Hx    Stomach cancer Neg Hx    Rectal cancer Neg Hx    Allergies  Allergen Reactions   Crab [Shellfish Allergy] Hives and Itching    Allergist advises against eating crab.   Zoloft [Sertraline Hcl] Other (See Comments)    Hair loss   Other Hives, Itching and Rash    Dermabond, steri strips   Sulfa Antibiotics Hives    Full body hives.    Patient Care Team: Willo Mini, NP as PCP - General (Nurse Practitioner) Pietro Redell RAMAN, MD as PCP - Cardiology (Cardiology)   Outpatient Medications Prior to Visit  Medication Sig   albuterol  (VENTOLIN  HFA) 108 (90 Base) MCG/ACT inhaler Inhale 2 puffs into the lungs every 6 (six) hours as needed for wheezing or shortness of breath.   dicyclomine  (BENTYL ) 20 MG tablet Take 1 tablet (20 mg total) by mouth 3 (three) times daily as needed for spasms.   fluticasone  (FLONASE ) 50 MCG/ACT nasal spray Place 2 sprays into both nostrils daily.   [DISCONTINUED] clonazePAM  (KLONOPIN ) 0.5 MG tablet Take 1/2 - 1 tablet (0.25-0.5 mg total) by mouth 2 (two) times daily as needed for anxiety.   metoCLOPramide  (REGLAN ) 10 MG tablet Take 1 tablet (10 mg total) by mouth 4 (four) times daily as needed for nausea.   Multiple Vitamin tablet Take 1 tablet by mouth daily.   pantoprazole  (PROTONIX ) 40 MG tablet Take 1 tablet (40 mg total) by mouth 2 (two) times daily. *Please call [phone] to schedule an office visit for more refills.*   Vilazodone  HCl (VIIBRYD ) 40 MG TABS Take 1 tablet (40 mg total) by mouth daily. NEEDS APPOINTMENT  FOR FURTHER REFILLS.   VITAMIN D , CHOLECALCIFEROL, PO Take 5,000 Int'l Units by mouth daily.   [DISCONTINUED] cariprazine  (VRAYLAR ) 3 MG capsule Take 1 capsule (3 mg total) by mouth daily.   [DISCONTINUED] methocarbamol  (ROBAXIN ) 500 MG tablet Take 1 tablet (500 mg total) by mouth 3 (three) times daily.   [DISCONTINUED] metoprolol  succinate (TOPROL -XL) 50 MG 24 hr tablet Take 1 tablet (50 mg total) by mouth daily.   [DISCONTINUED] pregabalin  (LYRICA ) 50 MG capsule Take 1 capsule by mouth at bedtime for one week then take 1 capsule twice daily for one week then take 1 capsule three times daily.   No facility-administered medications prior to visit.    Review of Systems  Constitutional: Negative.   HENT: Negative.    Eyes: Negative.   Respiratory: Negative.    Cardiovascular: Negative.   Gastrointestinal: Negative.   Genitourinary: Negative.   Musculoskeletal:  Positive for back pain.  Skin: Negative.   Neurological: Negative.   Endo/Heme/Allergies: Negative.   Psychiatric/Behavioral:  Positive for depression. The patient is nervous/anxious.           Objective:     BP 117/76 (BP Location: Left Arm, Cuff Size: Large)   Pulse 74   Resp 20   Ht 5' 5 (1.651 m)   Wt 105.5 kg   SpO2 98%   BMI 38.69 kg/m  BP Readings from Last 3 Encounters:  02/14/24 117/76  02/14/24 125/72  09/15/23 117/66   Wt Readings from Last 3 Encounters:  02/14/24 105.5 kg  02/14/24 105.1 kg  09/15/23 103.5 kg  Physical Exam Vitals and nursing note reviewed.  Constitutional:      General: She is not in acute distress. HENT:     Head: Normocephalic.     Right Ear: Tympanic membrane, ear canal and external ear normal. There is no impacted cerumen.     Left Ear: Tympanic membrane, ear canal and external ear normal. There is no impacted cerumen.     Nose: Nose normal.     Mouth/Throat:     Mouth: Mucous membranes are moist.     Pharynx: Oropharynx is clear.  Eyes:      Conjunctiva/sclera: Conjunctivae normal.     Pupils: Pupils are equal, round, and reactive to light.  Cardiovascular:     Rate and Rhythm: Normal rate and regular rhythm.     Pulses: Normal pulses.     Heart sounds: Normal heart sounds.  Pulmonary:     Effort: Pulmonary effort is normal.     Breath sounds: Normal breath sounds.  Abdominal:     General: Bowel sounds are normal.     Palpations: Abdomen is soft.  Musculoskeletal:        General: No swelling or tenderness. Normal range of motion.     Cervical back: Normal range of motion and neck supple.  Skin:    General: Skin is warm and dry.  Neurological:     General: No focal deficit present.     Mental Status: She is alert and oriented to person, place, and time.  Psychiatric:        Mood and Affect: Mood is not depressed. Affect is flat.        Behavior: Behavior normal.        Thought Content: Thought content normal.        Judgment: Judgment normal.      Results for orders placed or performed in visit on 02/14/24  CBC  Result Value Ref Range   WBC 8.6 4.0 - 10.5 K/uL   RBC 4.43 3.87 - 5.11 MIL/uL   Hemoglobin 14.0 12.0 - 15.0 g/dL   HCT 58.5 63.9 - 53.9 %   MCV 93.5 80.0 - 100.0 fL   MCH 31.6 26.0 - 34.0 pg   MCHC 33.8 30.0 - 36.0 g/dL   RDW 86.8 88.4 - 84.4 %   Platelets 366 150 - 400 K/uL   nRBC 0.0 0.0 - 0.2 %  Retic Panel  Result Value Ref Range   Retic Ct Pct 2.3 0.4 - 3.1 %   RBC. 4.42 3.87 - 5.11 MIL/uL   Retic Count, Absolute 102.1 19.0 - 186.0 K/uL   Immature Retic Fract 9.4 2.3 - 15.9 %   Reticulocyte Hemoglobin 35.4 >27.9 pg   Last CBC Lab Results  Component Value Date   WBC 8.6 02/14/2024   HGB 14.0 02/14/2024   HCT 41.4 02/14/2024   MCV 93.5 02/14/2024   MCH 31.6 02/14/2024   RDW 13.1 02/14/2024   PLT 366 02/14/2024   Last metabolic panel Lab Results  Component Value Date   GLUCOSE 104 (H) 04/15/2023   NA 137 04/15/2023   K 3.8 04/15/2023   CL 103 04/15/2023   CO2 24 04/15/2023    BUN 11 04/15/2023   CREATININE 0.76 04/15/2023   GFRNONAA >60 04/15/2023   CALCIUM 9.2 04/15/2023   PROT 6.8 04/15/2023   ALBUMIN 4.2 04/15/2023   LABGLOB 2.5 03/02/2023   BILITOT 0.3 04/15/2023   ALKPHOS 143 (H) 04/15/2023   AST 31 04/15/2023   ALT 54 (  H) 04/15/2023   ANIONGAP 10 04/15/2023   Last lipids Lab Results  Component Value Date   CHOL 203 (H) 10/15/2021   HDL 55 10/15/2021   LDLCALC 114 (H) 10/15/2021   TRIG 223 (H) 10/15/2021   CHOLHDL 3.7 10/15/2021   Last hemoglobin A1c Lab Results  Component Value Date   HGBA1C 5.6 03/02/2023   Last thyroid  functions Lab Results  Component Value Date   TSH 1.790 03/02/2023   FREET4 1.01 10/13/2012        Assessment & Plan:    Routine Health Maintenance and Physical Exam  Immunization History  Administered Date(s) Administered   Influenza, Seasonal, Injecte, Preservative Fre 11/25/2022   Influenza-Unspecified 12/28/2014, 01/14/2019, 01/10/2020, 01/17/2022, 01/13/2024   PFIZER(Purple Top)SARS-COV-2 Vaccination 06/07/2019, 06/29/2019, 03/25/2020   Pneumococcal-Unspecified 01/13/2024   Tdap 02/09/2017   Unspecified SARS-COV-2 Vaccination 06/08/2019, 06/28/2019    Health Maintenance  Topic Date Due   Pneumococcal Vaccine (1 of 2 - PCV) 05/09/1997   Hepatitis B Vaccines 19-59 Average Risk (1 of 3 - 19+ 3-dose series) Never done   HPV VACCINES (1 - 3-dose SCDM series) Never done   COVID-19 Vaccine (6 - 2025-26 season) 03/01/2024 (Originally 11/28/2023)   Mammogram  06/15/2025   DTaP/Tdap/Td (2 - Td or Tdap) 02/10/2027   Cervical Cancer Screening (HPV/Pap Cotest)  11/25/2027   Colonoscopy  11/22/2029   Influenza Vaccine  Completed   HIV Screening  Completed   Meningococcal B Vaccine  Aged Out   Hepatitis C Screening  Discontinued    Discussed health benefits of physical activity, and encouraged her to engage in regular exercise appropriate for her age and condition.  1. Annual physical exam (Primary) -  Comprehensive metabolic panel with GFR - Lipid panel  2. Fibromyalgia -Recommended increasing Lyrica  50 mg to a daily regimen by tapering up to 1 daily x 1 week, then increase to 2 caps daily x 1 week, and then 3 caps daily - pregabalin  (LYRICA ) 50 MG capsule; Take 1 capsule by mouth at bedtime for one week then take 1 capsule twice daily for one week then take 1 capsule three times daily.  Dispense: 90 capsule; Refill: 3   3. Anxiety and depression -Will discontinue Vrylar 3 mg and increase to Vrylar 4.5 mg daily -Continue taking clonazepam  0.5 mg as needed -Will consider referral to psychiatry and counseling -Follow up in 4 weeks - clonazePAM  (KLONOPIN ) 0.5 MG tablet; Take 1/2 - 1 tablet (0.25-0.5 mg total) by mouth 2 (two) times daily as needed for anxiety.  Dispense: 30 tablet; Refill: 0  4. Left lumbar radiculitis -Will continue Robaxin  500 mg three times daily -Recommended taking Lyrica  50 mg daily tapering up two twice daily x 1 week, and then 3 x daily  - methocarbamol  (ROBAXIN ) 500 MG tablet; Take 1 tablet (500 mg total) by mouth 3 (three) times daily.  Dispense: 90 tablet; Refill: 3 - pregabalin  (LYRICA ) 50 MG capsule; Take 1 capsule by mouth at bedtime for one week then take 1 capsule twice daily for one week then take 1 capsule three times daily.  Dispense: 90 capsule; Refill: 3     Derrek JINNY Freund, NP Student

## 2024-02-14 NOTE — Progress Notes (Signed)
 Hematology and Oncology Follow Up Visit  Tracey Patterson [id number] Jul 13, 1980 43 y.o. 02/14/2024  Past Medical History:  Diagnosis Date   Anxiety    Arthritis    Asthma    Chronic headache    COVID-19 11/2020   Depression    Fibromyalgia    GERD (gastroesophageal reflux disease)    History of TMJ disorder    IBS (irritable bowel syndrome)    Obesity    Pneumonia     Principle Diagnosis:  Low iron  secondary to malabsorption (IBS-C), gastritis Reactive thrombocytosis  Former work up Colonoscopy- 10/2019- no significant findings  Endoscopy- 12/2021- gastritis  Current Therapy:   IV Iron - Venofer  300 mg- last infusion series was (04/22/2023-05/09/2023)     Interim History:  [name] is back for follow-up for thrombocytopenia and low iron  levels.     Today she reports being ok overall. Fatigue has worsened a bit since our last visit.   She tolerated the IV infusions well. Mild fatigue the next day but otherwise no negative side effects. Salt cravings and most of her SOB improved with these treatments.   Menstrual cycles are average in nature. 25-26 days in interval length. She will have 2-3 days of heavy cycle with 2-3 days of light menstruation. IUD removed last summer.  There has been no bleeding to her knowledge: denies epistaxis, gingivitis, hemoptysis, hematemesis, hematuria, melena, excessive bruising, blood donation.   No fever, chills, n/v, cough, rash, chest pain, abdominal pain or changes in bowel or bladder habits.  No swelling or tenderness in her extremities.  No falls or syncope.  Appetite and hydration are improved  Wt Readings from Last 3 Encounters:  02/14/24 231 lb 12.8 oz (105.1 kg)  09/15/23 228 lb 1.9 oz (103.5 kg)  07/06/23 227 lb 3.2 oz (103.1 kg)     Medications:   Current Outpatient Medications:    albuterol  (VENTOLIN  HFA) 108 (90 Base) MCG/ACT inhaler, Inhale 2 puffs into the lungs every 6 (six) hours as needed for wheezing or  shortness of breath., Disp: 6.7 g, Rfl: 0   cariprazine  (VRAYLAR ) 3 MG capsule, Take 1 capsule (3 mg total) by mouth daily., Disp: 90 capsule, Rfl: 1   clonazePAM  (KLONOPIN ) 0.5 MG tablet, Take 1/2 - 1 tablet (0.25-0.5 mg total) by mouth 2 (two) times daily as needed for anxiety., Disp: 30 tablet, Rfl: 0   dicyclomine  (BENTYL ) 20 MG tablet, Take 1 tablet (20 mg total) by mouth 3 (three) times daily as needed for spasms., Disp: 50 tablet, Rfl: 0   fluticasone  (FLONASE ) 50 MCG/ACT nasal spray, Place 2 sprays into both nostrils daily., Disp: 16 g, Rfl: 6   methocarbamol  (ROBAXIN ) 500 MG tablet, Take 1 tablet (500 mg total) by mouth 3 (three) times daily., Disp: 90 tablet, Rfl: 0   metoCLOPramide  (REGLAN ) 10 MG tablet, Take 1 tablet (10 mg total) by mouth 4 (four) times daily as needed for nausea., Disp: 120 tablet, Rfl: 0   metoprolol  succinate (TOPROL -XL) 50 MG 24 hr tablet, Take 1 tablet (50 mg total) by mouth daily., Disp: 30 tablet, Rfl: 0   Multiple Vitamin tablet, Take 1 tablet by mouth daily., Disp: , Rfl:    pantoprazole  (PROTONIX ) 40 MG tablet, Take 1 tablet (40 mg total) by mouth 2 (two) times daily. *Please call [phone] to schedule an office visit for more refills.*, Disp: 180 tablet, Rfl: 0   pregabalin  (LYRICA ) 50 MG capsule, Take 1 capsule by mouth at bedtime for one week then take 1  capsule twice daily for one week then take 1 capsule three times daily., Disp: 90 capsule, Rfl: 3   Vilazodone  HCl (VIIBRYD ) 40 MG TABS, Take 1 tablet (40 mg total) by mouth daily. NEEDS APPOINTMENT FOR FURTHER REFILLS., Disp: 30 tablet, Rfl: 0   VITAMIN D , CHOLECALCIFEROL, PO, Take 5,000 Int'l Units by mouth daily., Disp: , Rfl:   Allergies:  Allergies  Allergen Reactions   Crab [Shellfish Allergy] Hives and Itching    Allergist advises against eating crab.   Zoloft [Sertraline Hcl] Other (See Comments)    Hair loss   Other Hives, Itching and Rash    Dermabond, steri strips   Sulfa Antibiotics  Hives    Full body hives.    Past Medical History, Surgical history, Social history, and Family History were reviewed and updated.  Review of Systems: Review of Systems  Constitutional:  Positive for fatigue.  HENT:  Negative.    Eyes: Negative.   Respiratory: Negative.  Shortness of breath: mild with exertion.   Cardiovascular: Negative.   Gastrointestinal: Negative.   Endocrine: Negative.   Genitourinary: Negative.    Musculoskeletal: Negative.   Skin: Negative.   Neurological: Negative.   Hematological: Negative.      Physical Exam:  height is 5' 5 (1.651 m) and weight is 231 lb 12.8 oz (105.1 kg). Her oral temperature is 97.9 F (36.6 C). Her blood pressure is 125/72 and her pulse is 76. Her respiration is 18 and oxygen saturation is 99%.   Physical Exam General: NAD Cardiovascular: regular rate and rhythm Pulmonary: clear ant fields Abdomen: soft, nontender, + bowel sounds GU: no suprapubic tenderness Extremities: no edema, no joint deformities Skin: no rashes Neurological: Weakness but otherwise nonfocal   Lab Results  Component Value Date   WBC 8.6 02/14/2024   HGB 14.0 02/14/2024   HCT 41.4 02/14/2024   MCV 93.5 02/14/2024   PLT 366 02/14/2024     Chemistry      Component Value Date/Time   NA 137 04/15/2023 1431   NA 137 03/02/2023 1053   K 3.8 04/15/2023 1431   CL 103 04/15/2023 1431   CO2 24 04/15/2023 1431   BUN 11 04/15/2023 1431   BUN 11 03/02/2023 1053   CREATININE 0.76 04/15/2023 1431   CREATININE 0.75 12/08/2021 1114      Component Value Date/Time   CALCIUM 9.2 04/15/2023 1431   ALKPHOS 143 (H) 04/15/2023 1431   AST 31 04/15/2023 1431   ALT 54 (H) 04/15/2023 1431   BILITOT 0.3 04/15/2023 1431     Encounter Diagnoses  Name Primary?   Thrombocytosis Yes   Iron  deficiency anemia, unspecified iron  deficiency anemia type    Assessment and Plan- Patient is a 43 y.o. female with iron  deficiency and reactive thrombocytosis. She was  treated with IV iron  which she tolerated well with mild fatigue.   Thrombocytosis resolved secondary to IV iron  infusions.  Hgb today is 14 and is normocytic.  Recent TSH, B12/Folate and vitamin D  levels were in target range- suggested potential sleep study discussion with PCP Iron  studies pending She will continue working with GI on her IBS-C and gastritis  Disposition: RTC 3 months APP, labs (CBC w/, retic, iron , ferritin)   Lauraine Dais PA-C 11/18/20259:36 AM

## 2024-02-14 NOTE — Progress Notes (Signed)
 Chief Complaint:medication refills Primary GI Doctor: Dr. Charlanne  HPI: 43 y.o. female with a past medical history of GERD, asthma, arthritis, chronic headaches, anxiety/depression/fibromyalgia, obesity, chronic constipation and others listed below presents for follow-up.   GI workup: 11/23/2019 colonoscopy with Dr. Charlanne Diverticula, internal hemorrhoids, unremarkable, TI normal.  Recall 10 years. 11/19/2021 CT abdomen pelvis with contrast for showed diffuse sigmoid colonic and rectal wall thickening with adjacent fat stranding suggesting proctocolitis, no pancreatic edema or pancreatic inflammatory changes.  12/08/2021 CT abdomen pelvis showed contrast medium in distal esophagus suggesting dysmotility or reflux, type I hernia, normal liver, spleen, pancreas.  Prominent stool throughout the colon favoring constipation diverticula unremarkable appendix. 12/2021 EGD: - Mildly tortuous esophagus. Dilated. - Gastritis. Biopsied. - Multiple gastric polyps. Resected and retrieved x 3. - Normal examined duodenum. Biopsied. Path: The esophageal biopsies did show mild reflux. The gastric polyps are fundic gland polyps.  12/2021 esophogram/barium swallow with tab: The pharynx is normal.  Mild dysmotility in the distal half of the esophagus.  The barium tablet passed through with no stricture or narrowing.  No other abnormalities identified. 01/2022 Abd US  complete: normal 01/2022 Abd xray:Moderate colonic stool burden without evidence of small bowel obstruction.  Interval History Patient last seen in GI office on 03/16/2022 by Alan, PA for evaluation of colitis  Patient presents or follow-up on IBS and GERD.  Patient has history of GERD and taking pantoprazole  40 mg po daily. Denies GERD symptoms or dysphagia.  Patient also has dicyclomine  as needed for IBS, reports she has not needed it recently. Patient has regular BM 2-3 times a day. No diarrhea or constipation. She has intermittent abdominal  discomfort. No blood in stool.   Her only complaint is of intermittent nausea, no vomiting. She reports it is worse with overeating or if high in fat. If she avoids trigger foods she does not have symptoms.  No changes to medication. No recent hospitalizations.   Wt Readings from Last 3 Encounters:  02/14/24 233 lb (105.7 kg)  02/14/24 232 lb 8 oz (105.5 kg)  02/14/24 231 lb 12.8 oz (105.1 kg)    Past Medical History:  Diagnosis Date   Allergy    Sulfa   Anxiety    Arthritis    Asthma    Chronic headache    COVID-19 11/2020   Depression    Fibromyalgia    GERD (gastroesophageal reflux disease)    History of TMJ disorder    IBS (irritable bowel syndrome)    Obesity    Pneumonia     Past Surgical History:  Procedure Laterality Date   BACK SURGERY  02/26/2014   KNEE ARTHROSCOPY  03/29/1996   left   LAPAROSCOPIC TUBAL LIGATION  02/28/2012   Procedure: LAPAROSCOPIC TUBAL LIGATION;  Surgeon: Rosaline LITTIE Cobble, MD;  Location: [facility];  Service: Gynecology;  Laterality: Bilateral;   SPINE SURGERY  12/15   Microdiscectomy   TONSILLECTOMY     TUBAL LIGATION      Current Outpatient Medications  Medication Sig Dispense Refill   albuterol  (VENTOLIN  HFA) 108 (90 Base) MCG/ACT inhaler Inhale 2 puffs into the lungs every 6 (six) hours as needed for wheezing or shortness of breath. 6.7 g 0   Cariprazine  HCl (VRAYLAR ) 4.5 MG CAPS Take 1 capsule (4.5 mg total) by mouth daily. 30 capsule 1   clonazePAM  (KLONOPIN ) 0.5 MG tablet Take 1/2 - 1 tablet (0.25-0.5 mg total) by mouth 2 (two) times daily as needed for anxiety. 30 tablet 0  fluticasone  (FLONASE ) 50 MCG/ACT nasal spray Place 2 sprays into both nostrils daily. 16 g 6   methocarbamol  (ROBAXIN ) 500 MG tablet Take 1 tablet (500 mg total) by mouth 3 (three) times daily. 90 tablet 3   metoCLOPramide  (REGLAN ) 10 MG tablet Take 1 tablet (10 mg total) by mouth 4 (four) times daily as needed for nausea. 120 tablet 0   metoprolol  succinate  (TOPROL -XL) 50 MG 24 hr tablet Take 1 tablet (50 mg total) by mouth daily. 90 tablet 3   Multiple Vitamin tablet Take 1 tablet by mouth daily.     pregabalin  (LYRICA ) 50 MG capsule Take 1 capsule by mouth at bedtime for one week then take 1 capsule twice daily for one week then take 1 capsule three times daily. 90 capsule 3   Vilazodone  HCl (VIIBRYD ) 40 MG TABS Take 1 tablet (40 mg total) by mouth daily. NEEDS APPOINTMENT FOR FURTHER REFILLS. 30 tablet 0   VITAMIN D , CHOLECALCIFEROL, PO Take 5,000 Int'l Units by mouth daily.     dicyclomine  (BENTYL ) 20 MG tablet Take 1 tablet (20 mg total) by mouth daily as needed for spasms. 50 tablet 0   pantoprazole  (PROTONIX ) 40 MG tablet Take 1 tablet (40 mg total) by mouth daily. *Please call [phone] to schedule an office visit for more refills.* 90 tablet 3   No current facility-administered medications for this visit.    Allergies as of 02/14/2024 - Review Complete 02/14/2024  Allergen Reaction Noted   Crab [shellfish allergy] Hives and Itching 07/01/2012   Zoloft [sertraline hcl] Other (See Comments) 12/14/2012   Other Hives, Itching, and Rash 04/02/2014   Sulfa antibiotics Hives 04/24/2011    Family History  Problem Relation Age of Onset   Hypertension Mother    Depression Mother    Hypertension Father    ADD / ADHD Son    Anxiety disorder Son    Breast cancer Maternal Aunt    Cancer Maternal Aunt    Prostate cancer Maternal Uncle    Prostate cancer Maternal Uncle    Colon cancer Maternal Uncle    Cancer Maternal Uncle    Cancer Maternal Uncle    Cancer Maternal Uncle    Hypertension Maternal Grandmother    Hearing loss Maternal Grandfather    Cancer Paternal Grandmother    Heart disease Paternal Grandfather    Hypertension Paternal Grandfather    Esophageal cancer Neg Hx    Stomach cancer Neg Hx    Rectal cancer Neg Hx     Review of Systems:    Constitutional: No weight loss, fever, chills, weakness or fatigue HEENT:  Eyes: No change in vision               Ears, Nose, Throat:  No change in hearing or congestion Skin: No rash or itching Cardiovascular: No chest pain, chest pressure or palpitations   Respiratory: No SOB or cough Gastrointestinal: See HPI and otherwise negative Genitourinary: No dysuria or change in urinary frequency Neurological: No headache, dizziness or syncope Musculoskeletal: No new muscle or joint pain Hematologic: No bleeding or bruising Psychiatric: No history of depression or anxiety    Physical Exam:  Vital signs: BP 118/72   Pulse (!) 107   Ht 5' 6 (1.676 m)   Wt 233 lb (105.7 kg)   SpO2 95%   BMI 37.61 kg/m   Constitutional:   Pleasant  female appears to be in NAD, Well developed, Well nourished, alert and cooperative Throat: Oral cavity and pharynx  without inflammation, swelling or lesion.  Respiratory: Respirations even and unlabored. Lungs clear to auscultation bilaterally.   No wheezes, crackles, or rhonchi.  Cardiovascular: Normal S1, S2. Regular rate and rhythm. No peripheral edema, cyanosis or pallor.  Gastrointestinal:  Soft, nondistended, nontender. No rebound or guarding. Hypoactive bowel sounds. No appreciable masses or hepatomegaly. Rectal:  Not performed.  Msk:  Symmetrical without gross deformities. Without edema, no deformity or joint abnormality.  Neurologic:  Alert and  oriented x4;  grossly normal neurologically.  Skin:   Dry and intact without significant lesions or rashes.  RELEVANT LABS AND IMAGING: CBC    Latest Ref Rng & Units 02/14/2024    8:47 AM 09/15/2023    2:34 PM 06/15/2023    1:03 PM  CBC  WBC 4.0 - 10.5 K/uL 8.6  9.8  10.3   Hemoglobin 12.0 - 15.0 g/dL 85.9  86.2  86.2   Hematocrit 36.0 - 46.0 % 41.4  40.3  40.8   Platelets 150 - 400 K/uL 366  409  366      CMP     Latest Ref Rng & Units 04/15/2023    2:31 PM 03/02/2023   10:53 AM 04/16/2022    2:02 PM  CMP  Glucose 70 - 99 mg/dL 895  87    BUN 6 - 20 mg/dL 11  11     Creatinine 9.55 - 1.00 mg/dL 9.23  9.21    Sodium 864 - 145 mmol/L 137  137    Potassium 3.5 - 5.1 mmol/L 3.8  4.7    Chloride 98 - 111 mmol/L 103  102    CO2 22 - 32 mmol/L 24  19    Calcium 8.9 - 10.3 mg/dL 9.2  9.0    Total Protein 6.5 - 8.1 g/dL 6.8  6.8  7.3   Total Bilirubin 0.0 - 1.2 mg/dL 0.3  0.2  0.3   Alkaline Phos 38 - 126 U/L 143  182  158    138   AST 15 - 41 U/L 31  40  20   ALT 0 - 44 U/L 54  52  32      Lab Results  Component Value Date   TSH 1.790 03/02/2023     Assessment: Encounter Diagnoses  Name Primary?   Gastroesophageal reflux disease, unspecified whether esophagitis present Yes   Irritable bowel syndrome with both constipation and diarrhea      43 year old female patient with history of GERD and IBS who is currently asymptomatic.  Patient will continue pantoprazole  40 mg p.o. daily along with dicyclomine  as needed.  Refilled prescriptions.  Up-to-date on colonoscopy.  EGD in 10/23 with gastritis and negative biopsies. 01/2022 Abd US  complete: normal. Suspect nausea also functional in nature.  Plan: -OTC ginger chews prn nausea -avoid trigger foods. -continue pantoprazole  40 mg po daily, refilled  -continue dicyclomine  prn, refilled.  -Recall colonoscopy 10/2029 -follow-up as needed  Thank you for the courtesy of this consult. Please call me with any questions or concerns.   Kristyne Woodring, FNP-C [facility] 02/14/2024, 4:53 PM  Cc: Willo Mini, NP

## 2024-02-14 NOTE — Progress Notes (Signed)
 Medical screening examination/treatment was performed by qualified clinical staff member and as supervising provider I was immediately available for consultation/collaboration. I have reviewed documentation and agree with assessment and plan.  Thayer Ohm, DNP, APRN, FNP-BC [facility]

## 2024-02-14 NOTE — Patient Instructions (Addendum)
 GERD Continue pantoprazole  40 mg po daily  Recommend GERD diet  IBS Dicyclomine  as needed Recommend low fodmap diet  Nausea  Can use otc ginger chews  _______________________________________________________  If your blood pressure at your visit was 140/90 or greater, please contact your primary care physician to follow up on this.  _______________________________________________________  If you are age 45 or older, your body mass index should be between 23-30. Your Body mass index is 37.61 kg/m. If this is out of the aforementioned range listed, please consider follow up with your Primary Care Provider.  If you are age 74 or younger, your body mass index should be between 19-25. Your Body mass index is 37.61 kg/m. If this is out of the aformentioned range listed, please consider follow up with your Primary Care Provider.   ________________________________________________________  The [facility] providers would like to encourage you to use MYCHART to communicate with providers for non-urgent requests or questions.  Due to long hold times on the telephone, sending your provider a message by [facility] may be a faster and more efficient way to get a response.  Please allow 48 business hours for a response.  Please remember that this is for non-urgent requests.  _______________________________________________________  [facility] is using a team-based approach to care.  Your team is made up of your doctor and two to three APPS. Our APPS (Nurse Practitioners and Physician Assistants) work with your physician to ensure care continuity for you. They are fully qualified to address your health concerns and develop a treatment plan. They communicate directly with your gastroenterologist to care for you. Seeing the Advanced Practice Practitioners on your physician's team can help you by facilitating care more promptly, often allowing for earlier appointments, access to diagnostic testing,  procedures, and other specialty referrals.   Thank you for trusting me with your gastrointestinal care. Deanna May, FNP-C

## 2024-02-15 ENCOUNTER — Ambulatory Visit: Payer: Self-pay | Admitting: Medical-Surgical

## 2024-02-15 ENCOUNTER — Ambulatory Visit: Payer: Self-pay | Admitting: Medical Oncology

## 2024-02-15 LAB — COMPREHENSIVE METABOLIC PANEL WITH GFR
ALT: 65 IU/L — ABNORMAL HIGH (ref 0–32)
AST: 41 IU/L — ABNORMAL HIGH (ref 0–40)
Albumin: 4.4 g/dL (ref 3.9–4.9)
Alkaline Phosphatase: 133 IU/L — ABNORMAL HIGH (ref 41–116)
BUN/Creatinine Ratio: 10 (ref 9–23)
BUN: 7 mg/dL (ref 6–24)
Bilirubin Total: 0.5 mg/dL (ref 0.0–1.2)
CO2: 20 mmol/L (ref 20–29)
Calcium: 9.1 mg/dL (ref 8.7–10.2)
Chloride: 100 mmol/L (ref 96–106)
Creatinine, Ser: 0.68 mg/dL (ref 0.57–1.00)
Globulin, Total: 2.2 g/dL (ref 1.5–4.5)
Glucose: 69 mg/dL — ABNORMAL LOW (ref 70–99)
Potassium: 4.2 mmol/L (ref 3.5–5.2)
Sodium: 140 mmol/L (ref 134–144)
Total Protein: 6.6 g/dL (ref 6.0–8.5)
eGFR: 109 mL/min/1.73 (ref >59)

## 2024-02-15 LAB — LIPID PANEL
Chol/HDL Ratio: 5.2 ratio — ABNORMAL HIGH (ref 0.0–4.4)
Cholesterol, Total: 209 mg/dL — ABNORMAL HIGH (ref 100–199)
HDL: 40 mg/dL (ref >39)
LDL Chol Calc (NIH): 127 mg/dL — ABNORMAL HIGH (ref 0–99)
Triglycerides: 237 mg/dL — ABNORMAL HIGH (ref 0–149)
VLDL Cholesterol Cal: 42 mg/dL — ABNORMAL HIGH (ref 5–40)

## 2024-02-17 MED ORDER — TIRZEPATIDE 10 MG/0.5ML ~~LOC~~ SOAJ: SUBCUTANEOUS | 11 refills | Status: DC

## 2024-02-24 ENCOUNTER — Other Ambulatory Visit: Payer: Self-pay | Admitting: Medical-Surgical

## 2024-02-27 ENCOUNTER — Other Ambulatory Visit: Payer: Self-pay

## 2024-02-27 ENCOUNTER — Other Ambulatory Visit (HOSPITAL_BASED_OUTPATIENT_CLINIC_OR_DEPARTMENT_OTHER): Payer: Self-pay

## 2024-02-27 MED ORDER — VILAZODONE HCL 40 MG PO TABS
40.0000 mg | ORAL_TABLET | Freq: Every day | ORAL | 0 refills | Status: DC
  Filled 2024-02-27: qty 30, 30d supply, fill #0

## 2024-03-13 ENCOUNTER — Ambulatory Visit: Admitting: Medical-Surgical

## 2024-03-14 ENCOUNTER — Other Ambulatory Visit: Payer: Self-pay

## 2024-03-15 ENCOUNTER — Ambulatory Visit: Admitting: Medical-Surgical

## 2024-03-23 ENCOUNTER — Telehealth: Admitting: Medical-Surgical

## 2024-03-23 ENCOUNTER — Encounter: Payer: Self-pay | Admitting: Medical-Surgical

## 2024-03-23 DIAGNOSIS — F418 Other specified anxiety disorders: Secondary | ICD-10-CM

## 2024-03-23 DIAGNOSIS — M797 Fibromyalgia: Secondary | ICD-10-CM | POA: Diagnosis not present

## 2024-03-23 DIAGNOSIS — F419 Anxiety disorder, unspecified: Secondary | ICD-10-CM

## 2024-03-23 NOTE — Progress Notes (Signed)
 Virtual Visit via Video Note  I connected with Tracey Patterson on 03/23/2024 at  8:30 AM EST by a video enabled telemedicine application and verified that I am speaking with the correct person using two identifiers.   I discussed the limitations of evaluation and management by telemedicine and the availability of in person appointments. The patient expressed understanding and agreed to proceed.  Patient location: Work Provider locations: office  Subjective:    CC: Mood and fibromyalgia follow-up  HPI: Pleasant 43 year old female presenting via MyChart video visit for the following:  Mood: Approximately 6 weeks ago, her dose of Vraylar  was increased from 3 mg daily to 4.5 mg daily.  Her Viibryd  was continued at 40 mg daily.  She was using clonazepam  on an as needed basis for severe anxiety.  Today, she reports compliance with dosing.  The increased dose of Vraylar  has been beneficial and she notes that her symptoms are much better than they were.  She does still have some breakthrough symptoms but overall is happy with the current regimen.  Denies SI/HI.  Fibromyalgia: Has been using methocarbamol  3 times daily as needed.  At her last visit, we discussed initiating Lyrica .  She is taking Lyrica  50 mg nightly and reports that the medication seems to be helping and her fibromyalgia symptoms are doing better.  She has not tried taking the Lyrica  more than once a day.  Past medical history, Surgical history, Family history not pertinant except as noted below, Social history, Allergies, and medications have been entered into the medical record, reviewed, and corrections made.   Review of Systems: See HPI for pertinent positives and negatives.   Objective:    General: Speaking clearly in complete sentences without any shortness of breath.  Alert and oriented x3.  Normal judgment. No apparent acute distress.  Impression and Recommendations:    1. Anxiety and depression (Primary) Mood much  improved and currently stable.  Continue Vraylar  4.5 mg and Viibryd  40 mg daily.  Continue clonazepam  as needed but recommend using very sparingly due to the risk of tolerance and dependence.  2. Fibromyalgia Symptoms improved with Lyrica .  Continue Lyrica  50 mg 3 times daily as needed.  I discussed the assessment and treatment plan with the patient. The patient was provided an opportunity to ask questions and all were answered. The patient agreed with the plan and demonstrated an understanding of the instructions.   The patient was advised to call back or seek an in-person evaluation if the symptoms worsen or if the condition fails to improve as anticipated.  Return in about 6 months (around 09/21/2024) for chronic disease follow up.  Zada FREDRIK [name], DNP, APRN, FNP-BC [facility]

## 2024-04-01 ENCOUNTER — Other Ambulatory Visit: Payer: Self-pay | Admitting: Medical-Surgical

## 2024-04-02 ENCOUNTER — Other Ambulatory Visit (HOSPITAL_BASED_OUTPATIENT_CLINIC_OR_DEPARTMENT_OTHER): Payer: Self-pay

## 2024-04-02 MED ORDER — VILAZODONE HCL 40 MG PO TABS
40.0000 mg | ORAL_TABLET | Freq: Every day | ORAL | 0 refills | Status: DC
  Filled 2024-04-02: qty 15, 15d supply, fill #0

## 2024-04-09 ENCOUNTER — Other Ambulatory Visit (HOSPITAL_BASED_OUTPATIENT_CLINIC_OR_DEPARTMENT_OTHER): Payer: Self-pay

## 2024-04-09 ENCOUNTER — Encounter: Payer: Self-pay | Admitting: Medical-Surgical

## 2024-04-09 MED ORDER — VILAZODONE HCL 40 MG PO TABS
40.0000 mg | ORAL_TABLET | Freq: Every day | ORAL | 1 refills | Status: AC
  Filled 2024-04-09 – 2024-04-13 (×3): qty 90, 90d supply, fill #0

## 2024-04-12 ENCOUNTER — Other Ambulatory Visit: Payer: Self-pay

## 2024-04-12 ENCOUNTER — Other Ambulatory Visit (HOSPITAL_BASED_OUTPATIENT_CLINIC_OR_DEPARTMENT_OTHER): Payer: Self-pay

## 2024-04-12 ENCOUNTER — Other Ambulatory Visit: Payer: Self-pay | Admitting: Medical-Surgical

## 2024-04-12 DIAGNOSIS — F32A Depression, unspecified: Secondary | ICD-10-CM

## 2024-04-12 MED ORDER — VRAYLAR 4.5 MG PO CAPS
4.5000 mg | ORAL_CAPSULE | Freq: Every day | ORAL | 0 refills | Status: AC
  Filled 2024-04-12: qty 30, 30d supply, fill #0

## 2024-04-13 ENCOUNTER — Other Ambulatory Visit (HOSPITAL_BASED_OUTPATIENT_CLINIC_OR_DEPARTMENT_OTHER): Payer: Self-pay

## 2024-05-16 ENCOUNTER — Inpatient Hospital Stay: Admitting: Medical Oncology

## 2024-05-16 ENCOUNTER — Inpatient Hospital Stay
